# Patient Record
Sex: Male | Born: 1935 | Race: White | Hispanic: No | Smoking: Former smoker
Health system: Southern US, Community
[De-identification: ages and names within clinical notes are randomized; demographics above are authoritative.]

## PROBLEM LIST (undated history)

## (undated) DIAGNOSIS — I639 Cerebral infarction, unspecified: Secondary | ICD-10-CM

## (undated) DIAGNOSIS — G309 Alzheimer's disease, unspecified: Secondary | ICD-10-CM

## (undated) DIAGNOSIS — Z860101 Personal history of adenomatous and serrated colon polyps: Secondary | ICD-10-CM

## (undated) DIAGNOSIS — N200 Calculus of kidney: Secondary | ICD-10-CM

## (undated) DIAGNOSIS — E785 Hyperlipidemia, unspecified: Secondary | ICD-10-CM

## (undated) DIAGNOSIS — I4891 Unspecified atrial fibrillation: Secondary | ICD-10-CM

## (undated) DIAGNOSIS — I2510 Atherosclerotic heart disease of native coronary artery without angina pectoris: Secondary | ICD-10-CM

## (undated) DIAGNOSIS — Z86010 Personal history of colonic polyps: Secondary | ICD-10-CM

## (undated) DIAGNOSIS — J449 Chronic obstructive pulmonary disease, unspecified: Secondary | ICD-10-CM

## (undated) DIAGNOSIS — E669 Obesity, unspecified: Secondary | ICD-10-CM

## (undated) DIAGNOSIS — F0280 Dementia in other diseases classified elsewhere without behavioral disturbance: Secondary | ICD-10-CM

## (undated) DIAGNOSIS — I214 Non-ST elevation (NSTEMI) myocardial infarction: Principal | ICD-10-CM

## (undated) DIAGNOSIS — R1310 Dysphagia, unspecified: Secondary | ICD-10-CM

## (undated) DIAGNOSIS — K589 Irritable bowel syndrome without diarrhea: Secondary | ICD-10-CM

## (undated) DIAGNOSIS — I10 Essential (primary) hypertension: Secondary | ICD-10-CM

## (undated) DIAGNOSIS — K219 Gastro-esophageal reflux disease without esophagitis: Secondary | ICD-10-CM

## (undated) DIAGNOSIS — Z8673 Personal history of transient ischemic attack (TIA), and cerebral infarction without residual deficits: Secondary | ICD-10-CM

## (undated) DIAGNOSIS — M359 Systemic involvement of connective tissue, unspecified: Secondary | ICD-10-CM

## (undated) HISTORY — DX: Irritable bowel syndrome, unspecified: K58.9

## (undated) HISTORY — DX: Personal history of adenomatous and serrated colon polyps: Z86.0101

## (undated) HISTORY — DX: Atherosclerotic heart disease of native coronary artery without angina pectoris: I25.10

## (undated) HISTORY — DX: Chronic obstructive pulmonary disease, unspecified: J44.9

## (undated) HISTORY — DX: Personal history of colonic polyps: Z86.010

## (undated) HISTORY — PX: BACK SURGERY: SHX140

## (undated) HISTORY — DX: Calculus of kidney: N20.0

## (undated) HISTORY — DX: Unspecified atrial fibrillation: I48.91

## (undated) HISTORY — DX: Gastro-esophageal reflux disease without esophagitis: K21.9

## (undated) HISTORY — DX: Hyperlipidemia, unspecified: E78.5

## (undated) HISTORY — DX: Non-ST elevation (NSTEMI) myocardial infarction: I21.4

## (undated) HISTORY — DX: Obesity, unspecified: E66.9

## (undated) HISTORY — DX: Essential (primary) hypertension: I10

## (undated) HISTORY — DX: Cerebral infarction, unspecified: I63.9

## (undated) HISTORY — DX: Personal history of transient ischemic attack (TIA), and cerebral infarction without residual deficits: Z86.73

## (undated) HISTORY — PX: APPENDECTOMY: SHX54

---

## 1998-05-01 ENCOUNTER — Inpatient Hospital Stay (HOSPITAL_COMMUNITY): Admission: EM | Admit: 1998-05-01 | Discharge: 1998-05-02 | Payer: Self-pay | Admitting: *Deleted

## 2001-03-30 ENCOUNTER — Other Ambulatory Visit: Admission: RE | Admit: 2001-03-30 | Discharge: 2001-03-30 | Payer: Self-pay | Admitting: Dermatology

## 2001-06-22 ENCOUNTER — Ambulatory Visit (HOSPITAL_COMMUNITY): Admission: RE | Admit: 2001-06-22 | Discharge: 2001-06-22 | Payer: Self-pay | Admitting: Internal Medicine

## 2001-12-09 HISTORY — PX: ESOPHAGOGASTRODUODENOSCOPY: SHX1529

## 2002-05-03 ENCOUNTER — Ambulatory Visit (HOSPITAL_COMMUNITY): Admission: RE | Admit: 2002-05-03 | Discharge: 2002-05-03 | Payer: Self-pay | Admitting: Internal Medicine

## 2002-05-03 ENCOUNTER — Encounter: Payer: Self-pay | Admitting: Internal Medicine

## 2002-05-17 ENCOUNTER — Ambulatory Visit (HOSPITAL_COMMUNITY): Admission: RE | Admit: 2002-05-17 | Discharge: 2002-05-17 | Payer: Self-pay | Admitting: Internal Medicine

## 2002-05-17 ENCOUNTER — Encounter: Payer: Self-pay | Admitting: Internal Medicine

## 2002-06-03 ENCOUNTER — Ambulatory Visit (HOSPITAL_COMMUNITY): Admission: RE | Admit: 2002-06-03 | Discharge: 2002-06-03 | Payer: Self-pay | Admitting: Internal Medicine

## 2003-05-30 ENCOUNTER — Encounter: Payer: Self-pay | Admitting: Internal Medicine

## 2003-05-30 ENCOUNTER — Ambulatory Visit (HOSPITAL_COMMUNITY): Admission: RE | Admit: 2003-05-30 | Discharge: 2003-05-30 | Payer: Self-pay | Admitting: Internal Medicine

## 2003-07-18 ENCOUNTER — Encounter: Payer: Self-pay | Admitting: Internal Medicine

## 2003-07-18 ENCOUNTER — Encounter (HOSPITAL_COMMUNITY): Admission: RE | Admit: 2003-07-18 | Discharge: 2003-08-17 | Payer: Self-pay | Admitting: Internal Medicine

## 2004-08-21 ENCOUNTER — Ambulatory Visit (HOSPITAL_COMMUNITY): Admission: RE | Admit: 2004-08-21 | Discharge: 2004-08-21 | Payer: Self-pay | Admitting: Internal Medicine

## 2004-08-21 HISTORY — PX: COLONOSCOPY: SHX174

## 2006-08-05 ENCOUNTER — Other Ambulatory Visit: Admission: RE | Admit: 2006-08-05 | Discharge: 2006-08-05 | Payer: Self-pay | Admitting: Ophthalmology

## 2006-08-05 ENCOUNTER — Encounter (INDEPENDENT_AMBULATORY_CARE_PROVIDER_SITE_OTHER): Payer: Self-pay | Admitting: *Deleted

## 2009-07-21 ENCOUNTER — Encounter (INDEPENDENT_AMBULATORY_CARE_PROVIDER_SITE_OTHER): Payer: Self-pay | Admitting: *Deleted

## 2009-09-06 ENCOUNTER — Ambulatory Visit: Payer: Self-pay | Admitting: Internal Medicine

## 2009-09-07 ENCOUNTER — Encounter: Payer: Self-pay | Admitting: Internal Medicine

## 2009-09-08 DIAGNOSIS — Z860100 Personal history of colon polyps, unspecified: Secondary | ICD-10-CM | POA: Insufficient documentation

## 2009-09-08 DIAGNOSIS — R1310 Dysphagia, unspecified: Secondary | ICD-10-CM | POA: Insufficient documentation

## 2009-09-08 HISTORY — PX: COLONOSCOPY: SHX174

## 2009-09-08 HISTORY — PX: ESOPHAGOGASTRODUODENOSCOPY: SHX1529

## 2009-09-13 ENCOUNTER — Ambulatory Visit: Payer: Self-pay | Admitting: Internal Medicine

## 2009-09-13 ENCOUNTER — Encounter: Payer: Self-pay | Admitting: Internal Medicine

## 2009-09-13 ENCOUNTER — Ambulatory Visit (HOSPITAL_COMMUNITY): Admission: RE | Admit: 2009-09-13 | Discharge: 2009-09-13 | Payer: Self-pay | Admitting: Internal Medicine

## 2009-09-17 ENCOUNTER — Encounter: Payer: Self-pay | Admitting: Internal Medicine

## 2011-04-26 NOTE — Op Note (Signed)
NAME:  Tyler Mejia, Tyler Mejia                           ACCOUNT NO.:  [id number]   MEDICAL RECORD NO.:  [id number]                   PATIENT TYPE:  AMB   LOCATION:  DAY                                  FACILITY:  [facility]   PHYSICIAN:  R. Roetta Sessions, M.D.              DATE OF BIRTH:  09-14-1936   DATE OF PROCEDURE:  08/21/2004  DATE OF DISCHARGE:                                 OPERATIVE REPORT   PROCEDURE:  Colonoscopy and snare polypectomy.   INDICATIONS FOR PROCEDURE:  The patient is a 75 year old gentleman with a  history of colonic adenomatous polyps (last polypectomy 2002) and done well  from a GI standpoint. He is here for surveillance. This approach has been  discussed with the patient at length. Potential risks, benefits, and  alternatives have been reviewed and questions answered. He is agreeable.  Please see my handwritten H&P for more information.   PROCEDURE NOTE:  O2 saturation, blood pressure, pulse, and respirations  monitored throughout the entirety of the procedure. Conscious sedation with  Versed 5 mg IV and Demerol 75 mg IV in divided doses.   INSTRUMENT:  Olympus video chip system.   FINDINGS:  Digital rectal examination revealed no abnormalities.   ENDOSCOPIC FINDINGS:  Prep was good.   Rectum:  Examination of the rectal mucosa including retroflexed view of anal  verge revealed no abnormalities.   Colon:  Colonic mucosa was surveyed from the rectosigmoid junction through  the left, transverse, and right colon to area of the appendiceal orifice,  ileocecal valve, and cecum. These structures were well seen and photographed  for the record. Olympus video scope was slowly withdrawn, and all previously  mentioned mucosal surfaces were again seen. There was a 5-mm pedunculated  polyp at the splenic flexure which was cold snared and recovered through the  scope. Remainder of colonic mucosa appeared normal. The patient tolerated  the procedure well and was reactive to  endoscopy.   IMPRESSION:  Pedunculated polyp at the splenic flexure, removed with snare.  Remainder of colonic mucosa and rectum appeared normal.   RECOMMENDATIONS:  1.  Follow up on pathology.  2.  Further recommendations to follow.      ___________________________________________                                            Jonathon Bellows, M.D.   RMR/MEDQ  D:  08/21/2004  T:  08/21/2004  Job:  [id number]   cc:   Kingsley Callander. Ouida Sills, M.D.  [address]  [place]  [place] [address]  Fax: [phone]

## 2011-04-26 NOTE — Op Note (Signed)
[facility]  Patient:    Tyler Mejia, Tyler Mejia Visit Number: [id number] MRN: [id number]          Service Type: END Location: DAY Attending Physician:  Jonathon Bellows Dictated by:   Roetta Sessions, M.D. Proc. Date: 06/03/02 Admit Date:  06/03/2002 Discharge Date: 06/03/2002   CC:         Carylon Perches, M.D.   Operative Report  PROCEDURE:  Diagnostic esophagogastroduodenoscopy.  ENDOSCOPIST:  Roetta Sessions, M.D.  INDICATIONS:  The patient is a pleasant 75 year old gentleman with longstanding reflux symptoms that have worsened recently.  No odynophagia or dysphagia, early satiety, nausea or vomiting, no melena.  Brother succumbed to esophageal cancer.  He has never had his upper GI tract imaged.  An EGD is now being done to further evaluate his symptoms.  Approach has been discussed with with patient at length.  Potential risks, benefits and alternatives have been reviewed and questions answered.  He is agreeable.  Please see my dictated H&P from May 31, 2002, for more information.  DESCRIPTION OF PROCEDURE:  O2 saturation, blood pressure, pulse and respirations were monitored throughout the entire procedure with conscious sedation.  Versed 3 mg IV and Demerol 75 mg IV in divided doses.  Instrument was Olympus video chip gastroscope.  FINDINGS:  Examination of the tubular esophagus revealed distal esophageal erosions of the circumferential distribution within 1 cm of the EG junction. There was no Barretts esophagus.  No ring or stricture or neoplasm.  The EG junction was easily traversed.  Stomach:  The gastric cavity was empty and insufflated well with air.  Further examination of the gastric mucosa including a retroflexed view of the proximal stomach, esophagogastric junction demonstrated no abnormalities.  Pylorus was patent and easily traversed.  The duodenum, bulb and second portion appeared normal.  THERAPEUTIC/DIAGNOSTIC MANEUVERS PERFORMED:   None.  No photographs were taken because of equipment failure.  The patient tolerated the procedure well and was reactive to endoscopy.  IMPRESSION: 1. Distal esophageal erosions consistent with mild erosive reflux    esophagitis. 2. The remainder of the esophagus, stomach and duodenum to the    second portion appeared normal.  RECOMMENDATIONS: 1. Begin Nexium 40 mg orally daily 30 minutes before breakfast.    He is to go by my office to get one months worth of free    samples. 2. Followup appointment with [place] to assess his progress in one    month. Dictated by:   Roetta Sessions, M.D. Attending Physician:  Jonathon Bellows DD:  06/03/02 TD:  06/05/02 Job: [id number]

## 2011-07-19 ENCOUNTER — Ambulatory Visit (INDEPENDENT_AMBULATORY_CARE_PROVIDER_SITE_OTHER): Payer: Medicare Other | Admitting: Urology

## 2011-07-19 DIAGNOSIS — F524 Premature ejaculation: Secondary | ICD-10-CM

## 2011-07-19 DIAGNOSIS — N529 Male erectile dysfunction, unspecified: Secondary | ICD-10-CM

## 2011-10-18 ENCOUNTER — Ambulatory Visit: Payer: Medicare Other | Admitting: Urology

## 2012-02-07 DIAGNOSIS — I214 Non-ST elevation (NSTEMI) myocardial infarction: Secondary | ICD-10-CM

## 2012-02-07 HISTORY — DX: Non-ST elevation (NSTEMI) myocardial infarction: I21.4

## 2012-02-27 ENCOUNTER — Encounter: Payer: Self-pay | Admitting: Internal Medicine

## 2012-02-27 ENCOUNTER — Telehealth: Payer: Self-pay

## 2012-02-27 NOTE — Telephone Encounter (Signed)
Pt called- he has been having a lot of heartburn and throat pain. He wonders if there is something wrong with his gallbladder. He has recently been on abx from Dr. Ouida Sills for chest cold and congestion. Pt said there was nothing wrong with his heart. He is taking prilosec and maalox and it is not helping. He called Dr. Ouida Sills and he cant see him until next week. We had one appt left for tomorrow with [facility]SL. Gave pt appt but advised him that if his condition worsens he needed to go to the ED.  Pt verbalized understanding.

## 2012-02-27 NOTE — Telephone Encounter (Signed)
Noted  

## 2012-02-28 ENCOUNTER — Encounter: Payer: Self-pay | Admitting: Gastroenterology

## 2012-02-28 ENCOUNTER — Emergency Department (HOSPITAL_COMMUNITY): Payer: Medicare Other

## 2012-02-28 ENCOUNTER — Inpatient Hospital Stay (HOSPITAL_COMMUNITY)
Admission: EM | Admit: 2012-02-28 | Discharge: 2012-03-03 | DRG: 247 | Disposition: A | Discharge status: Home / Self Care | Payer: Medicare Other | Attending: Internal Medicine | Admitting: Internal Medicine

## 2012-02-28 ENCOUNTER — Ambulatory Visit (INDEPENDENT_AMBULATORY_CARE_PROVIDER_SITE_OTHER): Payer: Medicare Other | Admitting: Gastroenterology

## 2012-02-28 ENCOUNTER — Other Ambulatory Visit: Payer: Self-pay

## 2012-02-28 ENCOUNTER — Encounter (HOSPITAL_COMMUNITY): Payer: Self-pay | Admitting: *Deleted

## 2012-02-28 DIAGNOSIS — I10 Essential (primary) hypertension: Secondary | ICD-10-CM | POA: Diagnosis present

## 2012-02-28 DIAGNOSIS — R0602 Shortness of breath: Secondary | ICD-10-CM | POA: Insufficient documentation

## 2012-02-28 DIAGNOSIS — R5383 Other fatigue: Secondary | ICD-10-CM | POA: Insufficient documentation

## 2012-02-28 DIAGNOSIS — Z7982 Long term (current) use of aspirin: Secondary | ICD-10-CM

## 2012-02-28 DIAGNOSIS — R630 Anorexia: Secondary | ICD-10-CM | POA: Insufficient documentation

## 2012-02-28 DIAGNOSIS — R5381 Other malaise: Secondary | ICD-10-CM

## 2012-02-28 DIAGNOSIS — Z86010 Personal history of colonic polyps: Secondary | ICD-10-CM

## 2012-02-28 DIAGNOSIS — I214 Non-ST elevation (NSTEMI) myocardial infarction: Principal | ICD-10-CM | POA: Diagnosis present

## 2012-02-28 DIAGNOSIS — R079 Chest pain, unspecified: Secondary | ICD-10-CM

## 2012-02-28 DIAGNOSIS — K219 Gastro-esophageal reflux disease without esophagitis: Secondary | ICD-10-CM | POA: Diagnosis present

## 2012-02-28 DIAGNOSIS — Z87891 Personal history of nicotine dependence: Secondary | ICD-10-CM

## 2012-02-28 DIAGNOSIS — Z8249 Family history of ischemic heart disease and other diseases of the circulatory system: Secondary | ICD-10-CM

## 2012-02-28 LAB — CARDIAC PANEL(CRET KIN+CKTOT+MB+TROPI)
CK, MB: 9.6 ng/mL (ref 0.3–4.0)
Total CK: 223 U/L (ref 7–232)
Troponin I: 3.49 ng/mL (ref <0.30)

## 2012-02-28 LAB — CK TOTAL AND CKMB (NOT AT ARMC)
CK, MB: 13.4 ng/mL (ref 0.3–4.0)
Relative Index: 5.2 — ABNORMAL HIGH (ref 0.0–2.5)

## 2012-02-28 LAB — CBC
Platelets: 247 10*3/uL (ref 150–400)
RDW: 12.9 % (ref 11.5–15.5)
WBC: 14.8 10*3/uL — ABNORMAL HIGH (ref 4.0–10.5)

## 2012-02-28 LAB — BASIC METABOLIC PANEL
Chloride: 97 mEq/L (ref 96–112)
GFR calc Af Amer: >90 mL/min (ref >90)
Potassium: 4.7 mEq/L (ref 3.5–5.1)

## 2012-02-28 LAB — PROTIME-INR
INR: 1.1 (ref 0.00–1.49)
Prothrombin Time: 14.4 seconds (ref 11.6–15.2)

## 2012-02-28 LAB — TROPONIN I: Troponin I: 2.47 ng/mL (ref <0.30)

## 2012-02-28 MED ORDER — EPTIFIBATIDE BOLUS VIA INFUSION
180.0000 ug/kg | Freq: Once | INTRAVENOUS | Status: AC
  Administered 2012-02-28: 19800 ug via INTRAVENOUS
  Filled 2012-02-28: qty 27

## 2012-02-28 MED ORDER — ASPIRIN EC 81 MG PO TBEC
81.0000 mg | DELAYED_RELEASE_TABLET | Freq: Every day | ORAL | Status: DC
  Administered 2012-02-29 – 2012-03-03 (×3): 81 mg via ORAL
  Filled 2012-02-28 (×4): qty 1

## 2012-02-28 MED ORDER — ASPIRIN 300 MG RE SUPP
300.0000 mg | RECTAL | Status: AC
  Filled 2012-02-28: qty 1

## 2012-02-28 MED ORDER — METOPROLOL TARTRATE 12.5 MG HALF TABLET
12.5000 mg | ORAL_TABLET | Freq: Two times a day (BID) | ORAL | Status: DC
  Administered 2012-02-28 – 2012-03-03 (×7): 12.5 mg via ORAL
  Filled 2012-02-28 (×9): qty 1

## 2012-02-28 MED ORDER — ASPIRIN 81 MG PO CHEW
324.0000 mg | CHEWABLE_TABLET | ORAL | Status: AC
  Administered 2012-02-28: 324 mg via ORAL
  Filled 2012-02-28: qty 4

## 2012-02-28 MED ORDER — HEPARIN (PORCINE) IN NACL 100-0.45 UNIT/ML-% IJ SOLN
1250.0000 [IU]/h | INTRAMUSCULAR | Status: DC
  Administered 2012-02-28: 1000 [IU]/h via INTRAVENOUS
  Administered 2012-02-29: 1150 [IU]/h via INTRAVENOUS
  Administered 2012-03-01: 1250 [IU]/h via INTRAVENOUS
  Filled 2012-02-28 (×6): qty 250

## 2012-02-28 MED ORDER — HEPARIN BOLUS VIA INFUSION
4000.0000 [IU] | Freq: Once | INTRAVENOUS | Status: AC
  Administered 2012-02-28: 4000 [IU] via INTRAVENOUS

## 2012-02-28 MED ORDER — NITROGLYCERIN 0.4 MG SL SUBL: 0.4000 mg | SUBLINGUAL_TABLET | SUBLINGUAL | Status: DC | PRN

## 2012-02-28 MED ORDER — AMLODIPINE BESYLATE 5 MG PO TABS
5.0000 mg | ORAL_TABLET | Freq: Every day | ORAL | Status: DC
  Administered 2012-02-29 – 2012-03-03 (×4): 5 mg via ORAL
  Filled 2012-02-28 (×4): qty 1

## 2012-02-28 MED ORDER — ASPIRIN 81 MG PO CHEW
324.0000 mg | CHEWABLE_TABLET | Freq: Once | ORAL | Status: AC
  Administered 2012-02-28: 324 mg via ORAL
  Filled 2012-02-28: qty 4

## 2012-02-28 MED ORDER — ACETAMINOPHEN 325 MG PO TABS: 650.0000 mg | ORAL_TABLET | ORAL | Status: DC | PRN

## 2012-02-28 MED ORDER — METOPROLOL TARTRATE 25 MG PO TABS: 25.0000 mg | ORAL_TABLET | Freq: Two times a day (BID) | ORAL | Status: DC

## 2012-02-28 MED ORDER — EPTIFIBATIDE 75 MG/100ML IV SOLN
2.0000 ug/kg/min | INTRAVENOUS | Status: DC
  Administered 2012-02-28 – 2012-03-02 (×9): 2 ug/kg/min via INTRAVENOUS
  Filled 2012-02-28 (×23): qty 100

## 2012-02-28 MED ORDER — ATORVASTATIN CALCIUM 80 MG PO TABS
80.0000 mg | ORAL_TABLET | Freq: Every day | ORAL | Status: DC
  Administered 2012-02-29 – 2012-03-02 (×3): 80 mg via ORAL
  Filled 2012-02-28 (×4): qty 1

## 2012-02-28 MED ORDER — ONDANSETRON HCL 4 MG/2ML IJ SOLN: 4.0000 mg | Freq: Four times a day (QID) | INTRAMUSCULAR | Status: DC | PRN

## 2012-02-28 NOTE — Patient Instructions (Signed)
I have discussed your case with Dr. Jena Gauss. We recommend you go to emergency room You need to be evaluate for dehydration. Your chest pain needs to be worked up.  The ER doctor will rule out heart issues. You may need CXR and or CT abd for further evaluation of anorexia, n/v, ?splenomegaly, to look at gallbladder.  I will call and let them know you are coming.

## 2012-02-28 NOTE — Progress Notes (Signed)
ANTICOAGULATION CONSULT NOTE - Initial Consult  Pharmacy Consult for Integrillin + Heparin Indication: chest pain/ACS  Allergies  Allergen Reactions  . Codeine     Patient Measurements: Height: 5\' 9"  (175.3 cm) Weight: 242 lb (109.77 kg) IBW/kg (Calculated) : 70.7  Heparin Dosing Weight: 94.8 kg  Vital Signs: Temp: 97.7 F (36.5 C) (03/22 1144) Temp src: Temporal (03/22 1029) BP: 132/81 mmHg (03/22 1900) Pulse Rate: 59  (03/22 1900)  Labs:  Basename 02/28/12 1419 02/28/12 1133  HGB -- 15.3  HCT -- 45.5  PLT -- 247  APTT -- --  LABPROT -- --  INR -- --  HEPARINUNFRC -- --  CREATININE -- 0.98  CKTOTAL 258* --  CKMB 13.4* --  TROPONINI -- 2.47*   Estimated Creatinine Clearance: 81.9 ml/min (by C-G formula based on Cr of 0.98).  Medical History: Past Medical History  Diagnosis Date  . GERD (gastroesophageal reflux disease)   . HTN (hypertension)   . Hx of adenomatous colonic polyps   . Kidney stones     Medications:  Prescriptions prior to admission  Medication Sig Dispense Refill  . amLODipine (NORVASC) 5 MG tablet Take 5 mg by mouth daily.       Marland Kitchen aspirin 81 MG tablet Take 81 mg by mouth daily.      . diphenoxylate-atropine (LOMOTIL) 2.5-0.025 MG per tablet Take 1 tablet by mouth 4 (four) times daily as needed.      . lansoprazole (PREVACID) 30 MG capsule Take 30 mg by mouth daily.      . metoprolol tartrate (LOPRESSOR) 25 MG tablet Take 12.5 mg by mouth 2 (two) times daily.       . traMADol (ULTRAM) 50 MG tablet Take 50 mg by mouth every 6 (six) hours as needed.      . cefUROXime (CEFTIN) 250 MG tablet Take 250 mg by mouth 2 (two) times daily.        Scheduled:    . amLODipine  5 mg Oral Daily  . aspirin  324 mg Oral Once  . aspirin  324 mg Oral NOW   Or  . aspirin  300 mg Rectal NOW  . aspirin EC  81 mg Oral Daily  . atorvastatin  80 mg Oral q1800  . heparin  4,000 Units Intravenous Once  . metoprolol tartrate  12.5 mg Oral BID  . DISCONTD:  metoprolol tartrate  25 mg Oral BID    Assessment: 76 y.o. M to start Heparin + Integrilin for ACS sx in the setting of positive cardiac enzymes. The patient was already initiated on heparin with a bolus of 4000 units and started at a drip rate of 1000 units/hr (10 ml/hr) around 1800 today. Hgb/Hct/Plt ok. No active s/sx of bleeding noted. SCr 0.98, CrCl~70-80 ml/min.  Goal of Therapy:  Heparin level of 0.3-0.5 units/ml (while concurrently on Integrilin)  Plan:  1. Continue heparin at current rate of 1000 units/hr 2. Integrilin bolus of 180 mcg/kg (~19800 mg) x 1 now 3. Initiate integrilin drip at rate of 2 mcg/kg/min (~219.6 mcg/min or 17.6 mL/hr) 4. Will check heparin level 8 hours after drip initiated (~0200 on 3/23) 5. Will check CBC ~8-12 hours after integrilin initiated (with a.m labs on 3/23) 6. Daily heparin levels starting on 3/24 a.m. 7. Will continue to monitor for any signs/symptoms of bleeding   Georgina Pillion, PharmD, BCPS Clinical Pharmacist Pager: [phone] 02/28/2012 8:38 PM

## 2012-02-28 NOTE — Assessment & Plan Note (Addendum)
Recent upper respiratory infection, given Ceftin one week ago. After 3 days of antibiotics, patient complains of central chest pain which radiates out to both shoulders. Associated with overwhelming fatigue, dyspnea on exertion, diaphoresis and presyncopal episode. No previous cardiac history. Patient's really without any significant abdominal pain. He does complain of her anorexia, nausea with meals. He tells me he has not had anything significant to eat and drink in several days and has had decreased urinary output. Some of his systemic symptoms could be related to dehydration however I feel that he needs to have cardiac etiology ruled out.  He also had increased fullness in the left upper quadrant, possible splenomegaly. He will need to have CT imaging of his abdomen at some point, possibly today in the emergent department if his cardiac workup is negative. I don't suspect that all the symptoms can be explained by GERD. We cannot rule out underlying gallbladder disorder at this time.  Would recommend the following labs during ED evaluation, CBC, CMET, lipase, cardiac enzymes. We will follow his workup and further recommendations to follow.  Above findings and recommendations discussed with Dr. Jena Gauss.

## 2012-02-28 NOTE — ED Notes (Signed)
Pt states he went to the Dr Eddie North and received an antibiotic Rx and the pain began 3 days after starting the antibiotic.

## 2012-02-28 NOTE — Progress Notes (Signed)
Received Critical lab values  For pt. Notified Dr Yetta Barre pt had CKMB 9.6 and Troponin of 3.49. No further action needed at this time.

## 2012-02-28 NOTE — ED Notes (Signed)
CRITICAL VALUE ALERT  Critical value received:  Troponin 2.5  Date of notification:  02/28/2012  Time of notification:  1355  Critical value read back: yes  Nurse who received alert:  Arva Chafe RN  MD notified (1st page):  [email or web address]  Time of first page:  1356  MD notified (2nd page):  Time of second page:  Responding MD:  Effie Shy  Time MD responded:  1356

## 2012-02-28 NOTE — ED Provider Notes (Cosign Needed)
History     CSN: [id number]  Arrival date & time 02/28/12  1133   First MD Initiated Contact with Patient 02/28/12 1403      Chief Complaint  Patient presents with  . Chest Pain    (Consider location/radiation/quality/duration/timing/severity/associated sxs/prior treatment) Patient is a 76 y.o. male presenting with chest pain. The history is provided by the patient and a relative.  Chest Pain The chest pain began 1 - 2 weeks ago. Chest pain occurs constantly. The chest pain is improving. At its most intense, the pain is at 10/10. The pain is currently at 1/10. The severity of the pain is moderate. The quality of the pain is described as burning. The pain does not radiate. Primary symptoms include fatigue. Pertinent negatives for primary symptoms include no fever and no cough.  Associated symptoms include weakness. He tried nothing for the symptoms. Risk factors include male gender and obesity.  His past medical history is significant for hypertension.    Tyler Mejia is a 76 y.o. male who presents to the Emergency Department complaining of an acute onset moderate to severe constant burning chest pain that was rated a 10/10 for 2 weeks. Pain in chest wall was a 10 this morning. Pt was sent from Dr. Jena Gauss. Pain is currently a 1/10. Pt took a pain pill and an antacid to treat pain this morning. Decreased appetite since onset, feels weak, pt has been taking aspirin daily but has not taken since the onset of chest pain. Past Medical History  Diagnosis Date  . GERD (gastroesophageal reflux disease)   . HTN (hypertension)   . Hx of adenomatous colonic polyps   . Kidney stones     Past Surgical History  Procedure Date  . Appendectomy   . Ruptured disc   . Esophagogastroduodenoscopy 2003    distal erosion/small hiatal hernia  . Colonoscopy 08/21/2004    Pedunculated polyp at the splenic flexure  . Colonoscopy 09/2009    Dr. Rourk--> Cecal and hepatic flexure polyps, tubular adenomas.  Next colonoscopy October 2013  . Esophagogastroduodenoscopy 09/2009    Dr. Rinaldo Ratel ring, distal septal regions, status post [facility] dilation, small hiatal hernia.  . Back surgery     Family History  Problem Relation Age of Onset  . Pneumonia Father     deceased age 93  . Heart disease Mother     deceased age 23  . Breast cancer Sister     2 sisters  . Esophageal cancer Brother     History  Substance Use Topics  . Smoking status: Former Games developer  . Smokeless tobacco: Not on file   Comment: quit about 9 years ago  . Alcohol Use: No      Review of Systems  Constitutional: Positive for fatigue. Negative for fever.  Respiratory: Negative for cough.   Cardiovascular: Positive for chest pain.  Neurological: Positive for weakness.  10 Systems reviewed and are negative for acute change except as noted in the HPI.   Allergies  Codeine  Home Medications   No current outpatient prescriptions on file.  BP 132/74  Pulse 67  Temp(Src) 99.2 F (37.3 C) (Oral)  Resp 18  Ht 5\' 9"  (1.753 m)  Wt 242 lb (109.77 kg)  BMI 35.74 kg/m2  SpO2 94%  Physical Exam  Nursing note and vitals reviewed. Constitutional: He is oriented to person, place, and time. He appears well-developed and well-nourished.  HENT:  Head: Normocephalic and atraumatic.  Neck: Normal range of motion. Neck supple.  No JVD present.  Cardiovascular: Normal rate, regular rhythm and normal heart sounds.   Pulmonary/Chest: Effort normal. He has wheezes (end expiratory wheeezing bilaterally). He has rales.  Musculoskeletal: Normal range of motion. He exhibits no edema.  Neurological: He is alert and oriented to person, place, and time. No cranial nerve deficit. He exhibits normal muscle tone.  Skin: Skin is warm and dry.  Psychiatric: He has a normal mood and affect. His behavior is normal. Thought content normal.    ED Course  Procedures (including critical care time) DIAGNOSTIC STUDIES: Oxygen  Saturation is 92% on room air, adequate by my interpretation.    Date: 02/28/2012-11:52  Rate: 61  Rhythm: normal sinus rhythm  QRS Axis: normal  Intervals: normal  ST/T Wave abnormalities: nonspecific ST changes  Conduction Disutrbances:none  Narrative Interpretation:   Old EKG Reviewed: none available   Date: 02/28/2012-15:57  Rate: 57  Rhythm: normal sinus rhythm  QRS Axis: normal  Intervals: normal  ST/T Wave abnormalities: nonspecific ST changes  Conduction Disutrbances:none  Narrative Interpretation:   Old EKG Reviewed: unchanged from earlier today  10:58 AM Reevaluation with update and discussion. After initial assessment and treatment, an updated evaluation reveals  no recurrence of chest pain. Call placed to his PCP, who prefers that the patient be transferred to [facility] for cardiology evaluation. Per the PCP, the patient was cathetered in 1999 with nonobstructive three-vessel coronary artery disease. [name]   IV Heparin Bolus and Drip ordered. Arranged transfer to [facility] in conjunction with Dr Clarise Cruz.  CRITICAL CARE Performed by: Flint Melter   Total critical care time: 35 min  Critical care time was exclusive of separately billable procedures and treating other patients.  Critical care was necessary to treat or prevent imminent or life-threatening deterioration.  Critical care was time spent personally by me on the following activities: development of treatment plan with patient and/or surrogate as well as nursing, discussions with consultants, evaluation of patient's response to treatment, examination of patient, obtaining history from patient or surrogate, ordering and performing treatments and interventions, ordering and review of laboratory studies, ordering and review of radiographic studies, pulse oximetry and re-evaluation of patient's condition.    COORDINATION OF CARE:  Medications  heparin ADULT infusion 100 units/mL (25000 units/250  mL) (1300 Units/hr Intravenous Rate/Dose Change 03/01/12 0025)  aspirin EC tablet 81 mg (81 mg Oral Given 03/01/12 1008)  nitroGLYCERIN (NITROSTAT) SL tablet 0.4 mg (not administered)  acetaminophen (TYLENOL) tablet 650 mg (not administered)  ondansetron (ZOFRAN) injection 4 mg (not administered)  atorvastatin (LIPITOR) tablet 80 mg (80 mg Oral Given 02/29/12 1707)  amLODipine (NORVASC) tablet 5 mg (5 mg Oral Given 03/01/12 1008)  metoprolol tartrate (LOPRESSOR) tablet 12.5 mg (12.5 mg Oral Given 03/01/12 1008)  eptifibatide (INTEGRILIN) 75 mg / 100 mL infusion (2 mcg/kg/min Intravenous New Bag/Given 03/01/12 0928)  sodium chloride 0.9 % injection 3 mL (3 mL Intravenous Given 03/01/12 1008)  sodium chloride 0.9 % injection 3 mL (not administered)  0.9 %  sodium chloride infusion (not administered)  0.9 %  sodium chloride infusion (not administered)  aspirin chewable tablet 324 mg (324 mg Oral Given 02/28/12 1424)  heparin bolus via infusion 4,000 Units (4000 Units Intravenous Given 02/28/12 1755)  aspirin chewable tablet 324 mg (324 mg Oral Given 02/28/12 2210)    Or  aspirin suppository 300 mg (  Rectal See Alternative 02/28/12 2210)  eptifibatide (INTEGRILIN) 0.75 mg/mL bolus via infusion 19,800 mcg (19800 mcg Intravenous Given 02/28/12 2100)  Labs Reviewed  CBC - Abnormal; Notable for the following:    WBC 14.8 (*)    All other components within normal limits  BASIC METABOLIC PANEL - Abnormal; Notable for the following:    Sodium 134 (*)    Glucose, Bld 100 (*)    GFR calc non Af Amer 80 (*)    All other components within normal limits  TROPONIN I - Abnormal; Notable for the following:    Troponin I 2.47 (*)    All other components within normal limits  CK TOTAL AND CKMB - Abnormal; Notable for the following:    Total CK 258 (*)    CK, MB 13.4 (*)    Relative Index 5.2 (*)    All other components within normal limits  CARDIAC PANEL(CRET KIN+CKTOT+MB+TROPI) - Abnormal; Notable for  the following:    CK, MB 9.6 (*)    Troponin I 3.49 (*)    Relative Index 4.3 (*)    All other components within normal limits  CARDIAC PANEL(CRET KIN+CKTOT+MB+TROPI) - Abnormal; Notable for the following:    CK, MB 7.7 (*) CRITICAL VALUE NOTED.  VALUE IS CONSISTENT WITH PREVIOUSLY REPORTED AND CALLED VALUE.   Troponin I 3.97 (*)    Relative Index 3.6 (*)    All other components within normal limits  CARDIAC PANEL(CRET KIN+CKTOT+MB+TROPI) - Abnormal; Notable for the following:    CK, MB 6.3 (*) CRITICAL VALUE NOTED.  VALUE IS CONSISTENT WITH PREVIOUSLY REPORTED AND CALLED VALUE.   Troponin I 3.14 (*)    Relative Index 3.3 (*)    All other components within normal limits  LIPID PANEL - Abnormal; Notable for the following:    HDL 32 (*)    All other components within normal limits  CBC - Abnormal; Notable for the following:    WBC 12.8 (*)    All other components within normal limits  HEPARIN LEVEL (UNFRACTIONATED) - Abnormal; Notable for the following:    Heparin Unfractionated 0.25 (*)    All other components within normal limits  HEPARIN LEVEL (UNFRACTIONATED) - Abnormal; Notable for the following:    Heparin Unfractionated 0.28 (*)    All other components within normal limits  CBC - Abnormal; Notable for the following:    WBC 11.0 (*)    All other components within normal limits  PROTIME-INR  HEPARIN LEVEL (UNFRACTIONATED)  LAB REPORT - SCANNED  HEPARIN LEVEL (UNFRACTIONATED)   US Abdomen Complete  02/28/2012  *RADIOLOGY REPORT*  Clinical Data:  Pain, vomiting, enlarged spleen  COMPLETE ABDOMINAL ULTRASOUND  Comparison:  None.  Findings:  Gallbladder:  The gallbladder is visualized and no gallstones are noted.  There is no pain over the gallbladder with compression.  Common bile duct:  The common bile duct is normal measuring 2.7 mm in diameter.  Liver:  The liver has a normal echogenic pattern.  No ductal dilatation is seen.  IVC:  Appears normal.  Pancreas:  No focal  abnormality seen.  Spleen:  The spleen is within normal limits measuring 7.8 cm sagittally.  Right Kidney:  No hydronephrosis is seen.  The right kidney measures 12.7 cm sagittally.  Left Kidney:  No hydronephrosis is noted.  The left kidney measures 12.7 cm.  Abdominal aorta:  The abdominal aorta is normal in caliber.  IMPRESSION: Negative abdominal ultrasound.  No gallstones.  No ductal dilatation.  Original Report Authenticated By: Juline Patch, M.D.   Chest Portable 1 View  02/28/2012  *RADIOLOGY REPORT*  Clinical Data:  Chest pain  PORTABLE CHEST - 1 VIEW  Comparison: None.  Findings: Cardiomegaly.  Mild interstitial prominence. Mild vascular congestion without overt CHF.  No visible infiltrate.  No effusion or pneumothorax.  IMPRESSION: Cardiomegaly with mild interstitial prominence. No overt failure or infiltrates.  Original Report Authenticated By: Elsie Stain, M.D.     1. NSTEMI (non-ST elevated myocardial infarction)       MDM  Evaluation consistent with NSTEMI, apparently, now resolved with no chest pain during ED evaluation. No apparent congestive heart failure, cardiac arrhythmia, or other decompensation. Patient would be a risk for discharge needs to be admitted with cardiology evaluation. Transfer process Initiated.    I personally performed the services described in this documentation, which was scribed in my presence. The recorded information has been reviewed and considered.         Flint Melter, MD 03/01/12 1101

## 2012-02-28 NOTE — ED Notes (Signed)
Pain  Ant chest , throat, shoulders.  "heart burn".  Sent by Dr Jena Gauss

## 2012-02-28 NOTE — Progress Notes (Addendum)
Primary Care Physician:  Carylon Perches, MD, MD  Primary Gastroenterologist:  Roetta Sessions, MD   Chief Complaint  Patient presents with  . Heartburn    hurting in chest    HPI:  Tyler Mejia is a 76 y.o. male here for next day appointment. He called in yesterday concerned that he was having gallbladder issues and cannot be seen by his family doctor until next week.  States his symptoms began 3 days after starting Ceftin for the upper respiratory infection (started one week ago). States he started having pain which radiated from the center of his chest out to both shoulders. He complains of exertional shortness of breath but states he has had this chronically. He notes that he is unable to do anything because of overwhelming fatigue. 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 That was a few days ago. Since then he has been sitting around doing nothing. He cannot sleep because of the chest discomfort. Denies orthopnea, PND. He initially thought it was his heartburn. Historically his heartburn has been well controlled in the recent past. He takes Prevacid 30 mg daily. He is added multiple over-the-counter antacids which have not affected his chest pain. He denies odynophagia, dysphagia, suspected food or pill obstruction. Complains of anorexia. States he feels nauseated if he tries to eat. Denies any abdominal pain or back pain. No dysuria or hematuria. States his cough is somewhat better. Denies weight loss. Denies lower extremity edema. Notes that he is urinating less because he is unable to drink. Stop Ceftin three days ago.  Some night sweats.   BM ok, less b/c less appetite. No melena, brbpr.  No fever.   Last EKG six months ago. No heart w/u.      Current Outpatient Prescriptions  Medication Sig Dispense Refill  . amLODipine (NORVASC) 5 MG tablet Take 5 mg by mouth daily.       Marland Kitchen aspirin 81 MG tablet Take 81 mg by mouth daily.      .  diphenoxylate-atropine (LOMOTIL) 2.5-0.025 MG per tablet Take 1 tablet by mouth 4 (four) times daily as needed.      . lansoprazole (PREVACID) 30 MG capsule Take 30 mg by mouth daily.      . metoprolol tartrate (LOPRESSOR) 25 MG tablet Take 25 mg by mouth 2 (two) times daily. 1/2 pill a day       . traMADol (ULTRAM) 50 MG tablet Take 50 mg by mouth every 6 (six) hours as needed.      . cefUROXime (CEFTIN) 250 MG tablet Take 250 mg by mouth 2 (two) times daily.         Allergies as of 02/28/2012 - Review Complete 02/28/2012  Allergen Reaction Noted  . Codeine      Past Medical History  Diagnosis Date  . GERD (gastroesophageal reflux disease)   . HTN (hypertension)   . Hx of adenomatous colonic polyps   . Kidney stones     Past Surgical History  Procedure Date  . Appendectomy   . Ruptured disc   . Esophagogastroduodenoscopy 2003    distal erosion/small hiatal hernia  . Colonoscopy 08/21/2004    Pedunculated polyp at the splenic flexure  . Colonoscopy 09/2009    Dr. Rourk--> Cecal and hepatic flexure polyps, tubular adenomas. Next colonoscopy October 2013  . Esophagogastroduodenoscopy 09/2009    Dr. Rinaldo Ratel ring, distal septal regions, status post [facility] dilation, small hiatal hernia.    Family  History  Problem Relation Age of Onset  . Pneumonia Father     deceased age 47  . Heart disease Mother     deceased age 46  . Breast cancer Sister     2 sisters  . Esophageal cancer Brother     History   Social History  . Marital Status: Divorced    Spouse Name: N/A    Number of Children: N/A  . Years of Education: N/A   Occupational History  . retired Animator    Social History Main Topics  . Smoking status: Former Games developer  . Smokeless tobacco: Not on file   Comment: quit about 9 years ago  . Alcohol Use: No  . Drug Use: No  . Sexually Active: Not on file   Other Topics Concern  . Not on file   Social History Narrative  . No narrative on  file      ROS:  General: See HPI.  Eyes: Negative for vision changes.  ENT: Negative for hoarseness, difficulty swallowing , nasal congestion. CV: Negative for palpitations. See HPI.  Respiratory: Negative for dyspnea at rest. Positive for cough/sputum. See HPI.  GI: See history of present illness. GU:  Negative for dysuria, hematuria, urinary incontinence, urinary frequency, nocturnal urination.  MS: Negative for joint pain, low back pain.  Derm: Negative for rash or itching.  Neuro: Negative for weakness, abnormal sensation, seizure, frequent headaches, memory loss, confusion.  Psych: Negative for anxiety, depression, suicidal ideation, hallucinations.  Endo: Negative for unusual weight change.  Heme: Negative for bruising or bleeding. Allergy: Negative for rash or hives.    Physical Examination:  BP 110/70  Pulse 60  Temp(Src) 98.3 F (36.8 C) (Temporal)  Ht 5\' 9"  (1.753 m)  Wt 242 lb 12.8 oz (110.133 kg)  BMI 35.86 kg/m2   General: Well-nourished, well-developed in no acute distress. Accompanied by family. Head: Normocephalic, atraumatic.   Eyes: Conjunctiva pink, no icterus. Mouth: Oropharyngeal mucosa moist and pink , no lesions erythema or exudate. Neck: Supple without thyromegaly, masses, or lymphadenopathy.  Lungs: Clear to auscultation bilaterally.  Heart: Regular rate and rhythm, no murmurs rubs or gallops.  Abdomen: Bowel sounds are normal. Obese. Minimal tenderness epigastrium. Fullness in LUQ with tenderness, ?splenomegaly.  No abdominal bruits or hernia.   Rectal: Not performed. Extremities: No lower extremity edema. No clubbing or deformities.  Neuro: Alert and oriented x 4 , grossly normal neurologically.  Skin: Warm and dry, no rash or jaundice.   Psych: Alert and cooperative, normal mood and affect.  Labs: None  Imaging Studies: No results found.   Impression/Plan: Recent upper respiratory infection, given Ceftin one week ago. After 3 days of  antibiotics, patient complains of central chest pain which radiates out to both shoulders. Associated with overwhelming fatigue, dyspnea on exertion, diaphoresis and presyncopal episode. No previous cardiac history. Patient's really without any significant abdominal pain. He does complain of her anorexia, nausea with meals. He tells me he has not had anything significant to eat and drink in several days and has had decreased urinary output. Some of his systemic symptoms could be related to dehydration however I feel that he needs to have cardiac etiology ruled out.  He also had increased fullness in the left upper quadrant, possible splenomegaly. He will need to have CT imaging of his abdomen at some point, possibly today in the emergent department if his cardiac workup is negative. I don't suspect that all the symptoms can be explained by GERD.  We cannot rule out underlying gallbladder disorder at this time.  Would recommend the following labs during ED evaluation, CBC, CMET, lipase, cardiac enzymes. We will follow his workup and further recommendations to follow.  Above findings and recommendations discussed with Dr. Jena Gauss.

## 2012-02-28 NOTE — ED Notes (Addendum)
Lab called for Lawernce Ion RN - Critical CKMB 13.4. MD notified.

## 2012-02-28 NOTE — ED Notes (Signed)
MD at bedside. 

## 2012-02-28 NOTE — Progress Notes (Signed)
Date: 02/28/2012  1152  Rate: 61  Rhythm: normal sinus rhythm  QRS Axis: normal  Intervals: normal  ST/T Wave abnormalities: nonspecific ST changes  Conduction Disutrbances:none  Narrative Interpretation:   Old EKG Reviewed: none available

## 2012-02-29 DIAGNOSIS — I214 Non-ST elevation (NSTEMI) myocardial infarction: Principal | ICD-10-CM

## 2012-02-29 DIAGNOSIS — I219 Acute myocardial infarction, unspecified: Secondary | ICD-10-CM

## 2012-02-29 LAB — CBC
HCT: 42 % (ref 39.0–52.0)
Hemoglobin: 14.6 g/dL (ref 13.0–17.0)
MCV: 83.7 fL (ref 78.0–100.0)
RBC: 5.02 MIL/uL (ref 4.22–5.81)
WBC: 12.8 10*3/uL — ABNORMAL HIGH (ref 4.0–10.5)

## 2012-02-29 LAB — LIPID PANEL: LDL Cholesterol: 75 mg/dL (ref 0–99)

## 2012-02-29 LAB — CARDIAC PANEL(CRET KIN+CKTOT+MB+TROPI)
CK, MB: 7.7 ng/mL (ref 0.3–4.0)
CK, MB: 6.3 ng/mL (ref 0.3–4.0)
Relative Index: 3.3 — ABNORMAL HIGH (ref 0.0–2.5)
Total CK: 193 U/L (ref 7–232)
Troponin I: 3.14 ng/mL (ref <0.30)

## 2012-02-29 LAB — HEPARIN LEVEL (UNFRACTIONATED)
Heparin Unfractionated: 0.25 IU/mL — ABNORMAL LOW (ref 0.30–0.70)
Heparin Unfractionated: 0.28 IU/mL — ABNORMAL LOW (ref 0.30–0.70)

## 2012-02-29 MED ORDER — SODIUM CHLORIDE 0.9 % IV SOLN
1.0000 mL/kg/h | INTRAVENOUS | Status: DC
  Administered 2012-03-02: 1 mL/kg/h via INTRAVENOUS

## 2012-02-29 MED ORDER — SODIUM CHLORIDE 0.9 % IV SOLN: 250.0000 mL | INTRAVENOUS | Status: DC | PRN

## 2012-02-29 MED ORDER — SODIUM CHLORIDE 0.9 % IJ SOLN
3.0000 mL | Freq: Two times a day (BID) | INTRAMUSCULAR | Status: DC
  Administered 2012-02-29 – 2012-03-01 (×4): 3 mL via INTRAVENOUS

## 2012-02-29 MED ORDER — SODIUM CHLORIDE 0.9 % IJ SOLN: 3.0000 mL | INTRAMUSCULAR | Status: DC | PRN

## 2012-02-29 NOTE — H&P (Signed)
Tyler Mejia, Tyler Mejia NO.:  [id number]  MEDICAL RECORD NO.:  [id number]  LOCATION:  3729                         FACILITY:  [facility]  PHYSICIAN:  Natasha Bence, MD       DATE OF BIRTH:  27-Oct-1936  DATE OF ADMISSION:  02/28/2012 DATE OF DISCHARGE:                             HISTORY & PHYSICAL   CHIEF COMPLAINT:  Chest discomfort.  HISTORY OF PRESENT ILLNESS:  Tyler Mejia is a 76 year old obese white male who presented initially to his primary care physician's office with a chief complaint of an upper respiratory infection and diffuse chest discomfort.  He reports he has had chronic shortness of breath, but over the last several days, he has had increasing fatigue.  He reports this as a "chest cold."  He reports diffuse chest discomfort that radiates to both of his shoulders, not associated with any shortness of breath.  He does have associated nausea and diaphoresis.  He reports this pain has been near constant for the entire week, has not been exacerbated by anything in particular or relieved by nothing.  He was treated for an upper respiratory infection by his primary care physician without any relief.  He actually thinks the antibiotic he was treated with made the symptoms worse.  He has had subjective fevers at night, otherwise none in the day.  He denies any melena or hematochezia.  He has not had any abdominal pain, otherwise complete review of systems was performed and was negative except for as stated above.  PAST MEDICAL HISTORY: 1. Hypertension. 2. History of colon polyps. 3. Obesity. 4. GERD. 5. Chronic shortness of breath.  PAST SURGICAL HISTORY:  Angiogram of his right upper extremity for "blood clots.  No record of this in the chart.  Otherwise, he has had colonic polyps removed.  FAMILY HISTORY:  Sister with coronary artery disease at 62.  SOCIAL HISTORY:  He is a former heavy smoker approximately 40 years, quit several years ago.  Otherwise,  drinks no alcohol, no illicit drug use.  ALLERGIES:  Allergic to CODEINE.  HOME MEDICATIONS:  He is on 1. Amlodipine 5 mg p.o. daily. 2. Aspirin 81 mg p.o. daily. 3. Ceftin 250 mg p.o. b.i.d. 4. Prevacid 30 mg p.o. daily. 5. Metoprolol 12.5 mg p.o. b.i.d. 6. Tramadol p.r.n.  PHYSICAL EXAMINATION:  VITAL SIGNS:  He is afebrile.  Temperature 97.7, pulse of 59 and regular, respiratory rate of 20, blood pressure 132/81, O2 sats 95% on room air. GENERAL:  He is an obese white male, in no apparent distress. HEENT:  Eyes has anicteric sclerae.  Mucous membranes are moist. NECK:  Normal jugular venous pressure.  No carotid bruits. LUNGS:  He has faint bibasilar crackles, otherwise clear. CARDIOVASCULAR:  He has regular rate and rhythm.  No murmurs, rubs, or gallops. ABDOMEN:  Obese, soft, nontender.  No masses or bruits. EXTREMITIES:  Warm with no edema.  He has symmetrical pulses throughout. SKIN:  No rashes or ulcers. NEUROLOGIC:  Grossly afocal.  LABORATORY STUDIES:  Sodium 134, potassium 4.7, chloride of 97, bicarb of 26, BUN of 20, creatinine of 0.96, calcium of 9.7, glucose of 100. CK-MB is 13.4, CK  is 258, and troponin is 2.47.  White count is 14.8, hematocrit is 46, platelet count is 247.  Abdominal ultrasound performed in an outside ED was normal.  Chest x-ray shows cardiomegaly with interstitial prominence, no infiltrates or effusions.  EKG shows sinus bradycardia, rate of 57 beats per minute.  No acute ST changes.  IMPRESSION AND PLAN:  This is a 76 year old obese, hypertensive white male who is a former smoker, who presents with 1-week history of chest pain concerning for angina, has positive cardiac enzymes, will be treated for non-ST elevation myocardial infarction.  He has already been started on aspirin and heparin infusion.  In addition to this, we will add Integrilin infusion.  He is currently pain free.  We will continue his metoprolol at 12.5 mg p.o. b.i.d., and  asked high-dose statin to this.  We will follow serial enzymes and get an echocardiogram to evaluate his left ventricular systolic function in the morning.  Given his story and lab findings, we will proceed with angiography at some point in the near future.          ______________________________ Natasha Bence, MD     MH/MEDQ  D:  02/28/2012  T:  02/29/2012  Job:  [id number]

## 2012-02-29 NOTE — Progress Notes (Signed)
ANTICOAGULATION CONSULT NOTE - Follow Up Consult  Pharmacy Consult for : Heparin + Integrilin Indication: Chest pain / ACS  Allergies  Allergen Reactions  . Codeine     Patient Measurements: Height: 5\' 9"  (175.3 cm) Weight: 242 lb (109.77 kg) IBW/kg (Calculated) : 70.7    Vital Signs: Temp: 97.7 F (36.5 C) (03/23 1415) Temp src: Oral (03/23 1415) BP: 119/66 mmHg (03/23 1415) Pulse Rate: 66  (03/23 1415)  Labs:  Alvira Philips 02/29/12 2029 02/29/12 0900 02/29/12 0159 02/29/12 0158 02/28/12 2054 02/28/12 2053 02/28/12 1133  HGB -- -- -- 14.6 -- -- 15.3  HCT -- -- -- 42.0 -- -- 45.5  PLT -- -- -- 233 -- -- 247  APTT -- -- -- -- -- -- --  LABPROT -- -- -- -- 14.4 -- --  INR -- -- -- -- 1.10 -- --  HEPARINUNFRC 0.28* 0.25* -- 0.34 -- -- --  CREATININE -- -- -- -- -- -- 0.98  CKTOTAL -- 193 213 -- -- 223 --  CKMB -- 6.3* 7.7* -- -- 9.6* --  TROPONINI -- 3.14* 3.97* -- -- 3.49* --   Estimated Creatinine Clearance: 81.9 ml/min (by C-G formula based on Cr of 0.98).   Medications:  Scheduled:    . amLODipine  5 mg Oral Daily  . aspirin  324 mg Oral NOW   Or  . aspirin  300 mg Rectal NOW  . aspirin EC  81 mg Oral Daily  . atorvastatin  80 mg Oral q1800  . metoprolol tartrate  12.5 mg Oral BID  . sodium chloride  3 mL Intravenous Q12H   Infusions:    . sodium chloride    . eptifibatide 2 mcg/kg/min (02/29/12 2138)  . heparin 1,150 Units/hr (02/29/12 1506)    Assessment:  Patient is receiving Heparin and Integrilin infusions for NSTEMI.    Heparin infusing at 1150 units/hr, Integrilin infusing at 2 mcg/kg/min.  Heparin level slightly below planned therapeutic range.  0.28 units/ml.  WBC/Hgb/Hct/Plts:  12.8/14.6/42.0/233 (03/23 0158). No evidence of bleeding noted.  Goal of Therapy:  Heparin level of 0.3 - 0.5 while on concurrent Integrilin infusion.   Plan:  Increase Heparin to 1300 units/hr. Next Heparin level with Am labs. Continue Integrilin at current  rate.  Joseline Mccampbell, Elisha Headland, Pharm.D. 02/29/2012 9:41 PM

## 2012-02-29 NOTE — Progress Notes (Signed)
  Echocardiogram 2D Echocardiogram has been performed.  Arthur Aydelotte L 02/29/2012, 8:46 AM

## 2012-02-29 NOTE — Progress Notes (Signed)
  Patient Name: Tyler Mejia      SUBJECTIVE: Hiastory reveiwed history of chest pain for 5 days but none since yesterday morning.  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  He also has a history of class 2-3 dyspnea without peripheral edema nocturnal dyspnea or orthopnea  Past Medical History  Diagnosis Date  . GERD (gastroesophageal reflux disease)   . HTN (hypertension)   . Hx of adenomatous colonic polyps   . Kidney stones     PHYSICAL EXAM Filed Vitals:   02/28/12 1421 02/28/12 1553 02/28/12 1705 02/28/12 1900  BP: 113/65 127/77 132/71 132/81  Pulse: 52 52 59 59  Temp:      Resp: 16 19 19 20   Height:      Weight:      SpO2: 92% 92% 94% 95%    Well developed and nourished in no acute distress HENT normal Neck supple with JVP-flat Clear Regular rate and rhythm, no murmurs or gallops Abd-soft with active BS No Clubbing cyanosis edema Skin-warm and dry A & Oriented  Grossly normal sensory and motor function   TELEMETRY: Reviewed telemetry pt in nsr    Intake/Output Summary (Last 24 hours) at 02/29/12 0800 Last data filed at 02/29/12 0700  Gross per 24 hour  Intake      0 ml  Output    175 ml  Net   -175 ml    LABS: Basic Metabolic Panel:  Lab 02/28/12 1610  NA 134*  K 4.7  CL 97  CO2 26  GLUCOSE 100*  BUN 20  CREATININE 0.98  CALCIUM 9.7  MG --  PHOS --   Cardiac Enzymes:  Basename 02/29/12 0159 02/28/12 2053 02/28/12 1419 02/28/12 1133  CKTOTAL 213 223 258* --  CKMB 7.7* 9.6* 13.4* --  CKMBINDEX -- -- -- --  TROPONINI 3.97* 3.49* -- 2.47*   CBC:  Lab 02/29/12 0158 02/28/12 1133  WBC 12.8* 14.8*  NEUTROABS -- --  HGB 14.6 15.3  HCT 42.0 45.5  MCV 83.7 83.8  PLT 233 247   PROTIME:  Basename 02/28/12 2054  LABPROT 14.4  INR 1.10     ASSESSMENT AND PLAN:  Patient Active Hospital Problem List: NSTEMI  (non-ST elevated myocardial infarction) (02/28/2012)  GERD (gastroesophageal reflux disease) (02/28/2012)  Chest pain (02/28/2012)  HTN (hypertension) (02/28/2012)   With NSTEMI needs cath on Monday On BB ,ASA HEP  Will resume integrilin  precatrh ordersw written Signed, Sherryl Manges MD  02/29/2012

## 2012-02-29 NOTE — Progress Notes (Signed)
ANTICOAGULATION CONSULT NOTE - Initial Consult  Pharmacy Consult for Integrillin + Heparin Indication: chest pain/ACS  Allergies  Allergen Reactions  . Codeine     Patient Measurements: Height: 5\' 9"  (175.3 cm) Weight: 242 lb (109.77 kg) IBW/kg (Calculated) : 70.7  Heparin Dosing Weight: 94.8 kg  Vital Signs: BP: 132/81 mmHg (03/22 1900) Pulse Rate: 59  (03/22 1900)  Labs:  Alvira Philips 02/29/12 0159 02/29/12 0158 02/28/12 2054 02/28/12 2053 02/28/12 1419 02/28/12 1133  HGB -- 14.6 -- -- -- 15.3  HCT -- 42.0 -- -- -- 45.5  PLT -- 233 -- -- -- 247  APTT -- -- -- -- -- --  LABPROT -- -- 14.4 -- -- --  INR -- -- 1.10 -- -- --  HEPARINUNFRC -- 0.34 -- -- -- --  CREATININE -- -- -- -- -- 0.98  CKTOTAL 213 -- -- 223 258* --  CKMB 7.7* -- -- 9.6* 13.4* --  TROPONINI 3.97* -- -- 3.49* -- 2.47*   Estimated Creatinine Clearance: 81.9 ml/min (by C-G formula based on Cr of 0.98).  Assessment: 76 y.o.male with ACS for Heparin/Integrilin  Goal of Therapy:  Heparin level of 0.3-0.5 units/ml (while concurrently on Integrilin)  Plan: Continue Heparin at current rate Follow-up am labs.   Geannie Risen, PharmD, BCPS 02/29/2012 3:11 AM

## 2012-02-29 NOTE — Progress Notes (Signed)
ANTICOAGULATION CONSULT NOTE - Follow Up Consult  Pharmacy Consult for Integrillin + Heparin Indication: Chest pain/ACS  Allergies  Allergen Reactions  . Codeine     Patient Measurements: Height: 5\' 9"  (175.3 cm) Weight: 242 lb (109.77 kg) IBW/kg (Calculated) : 70.7  Heparin Dosing Weight:    Vital Signs: BP: 145/76 mmHg (03/23 1052) Pulse Rate: 64  (03/23 1052)  Labs:  Basename 02/29/12 0900 02/29/12 0159 02/29/12 0158 02/28/12 2054 02/28/12 2053 02/28/12 1133  HGB -- -- 14.6 -- -- 15.3  HCT -- -- 42.0 -- -- 45.5  PLT -- -- 233 -- -- 247  APTT -- -- -- -- -- --  LABPROT -- -- -- 14.4 -- --  INR -- -- -- 1.10 -- --  HEPARINUNFRC 0.25* -- 0.34 -- -- --  CREATININE -- -- -- -- -- 0.98  CKTOTAL 193 213 -- -- 223 --  CKMB 6.3* 7.7* -- -- 9.6* --  TROPONINI 3.14* 3.97* -- -- 3.49* --   Estimated Creatinine Clearance: 81.9 ml/min (by C-G formula based on Cr of 0.98).    Assessment:  Pt has NSTEMI. Will have cath on Mon.  Heparin level is 0.25, subtherapeutic. Goal of Therapy:  Heparin level 0.3-0.5   Plan:  Pt is on heparin and integrilin. Heparin is slightly subtherapeutic. Will increase rate to 1150 units/hr. Will recheck heparin level in 8 hrs.  Eugene Garnet 02/29/2012,11:50 AM

## 2012-03-01 LAB — HEPARIN LEVEL (UNFRACTIONATED): Heparin Unfractionated: 0.5 IU/mL (ref 0.30–0.70)

## 2012-03-01 LAB — CBC
Hemoglobin: 14.4 g/dL (ref 13.0–17.0)
MCHC: 34.4 g/dL (ref 30.0–36.0)
WBC: 11 10*3/uL — ABNORMAL HIGH (ref 4.0–10.5)

## 2012-03-01 NOTE — Progress Notes (Signed)
Patient Name: Tyler Mejia      SUBJECTIVE: History reveiwed history of chest pain for 5 days but none since friday. Admitted and found to have a non-STEMI .  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  He also has a history of class 2-3 dyspnea without peripheral edema nocturnal dyspnea or orthopnea  Past Medical History  Diagnosis Date  . GERD (gastroesophageal reflux disease)   . HTN (hypertension)   . Hx of adenomatous colonic polyps   . Kidney stones    Current Facility-Administered Medications  Medication Dose Route Frequency Provider Last Rate Last Dose  . 0.9 %  sodium chloride infusion  250 mL Intravenous PRN Duke Salvia, MD      . 0.9 %  sodium chloride infusion  1 mL/kg/hr Intravenous Continuous Duke Salvia, MD      . acetaminophen (TYLENOL) tablet 650 mg  650 mg Oral Q4H PRN Rosalyn Gess. Margo Aye, MD      . amLODipine (NORVASC) tablet 5 mg  5 mg Oral Daily Michael E. Margo Aye, MD   5 mg at 02/29/12 1052  . aspirin EC tablet 81 mg  81 mg Oral Daily Michael E. Margo Aye, MD   81 mg at 02/29/12 1052  . atorvastatin (LIPITOR) tablet 80 mg  80 mg Oral q1800 Michael E. Margo Aye, MD   80 mg at 02/29/12 1707  . eptifibatide (INTEGRILIN) 75 mg / 100 mL infusion  2 mcg/kg/min Intravenous Continuous Dolores Patty, MD 17.6 mL/hr at 03/01/12 0322 2 mcg/kg/min at 03/01/12 0322  . heparin ADULT infusion 100 units/mL (25000 units/250 mL)  1,300 Units/hr Intravenous Continuous Dolores Patty, MD 13 mL/hr at 03/01/12 0025 1,300 Units/hr at 03/01/12 0025  . metoprolol tartrate (LOPRESSOR) tablet 12.5 mg  12.5 mg Oral BID Rosalyn Gess. Margo Aye, MD   12.5 mg at 02/29/12 2134  . nitroGLYCERIN (NITROSTAT) SL tablet 0.4 mg  0.4 mg Sublingual Q5 Min x 3 PRN Rosalyn Gess. Margo Aye, MD      . ondansetron [facility]) injection 4 mg  4 mg Intravenous Q6H PRN Rosalyn Gess. Margo Aye, MD      . sodium  chloride 0.9 % injection 3 mL  3 mL Intravenous Q12H Duke Salvia, MD   3 mL at 02/29/12 2134  . sodium chloride 0.9 % injection 3 mL  3 mL Intravenous PRN Duke Salvia, MD           PHYSICAL EXAM Filed Vitals:   02/29/12 1415 02/29/12 1700 02/29/12 2100 03/01/12 0500  BP: 119/66  138/84 132/74  Pulse: 66  68 67  Temp: 97.7 F (36.5 C)  98.8 F (37.1 C) 99.2 F (37.3 C)  TempSrc: Oral  Oral Oral  Resp: 18  18 18   Height:      Weight:  242 lb (109.77 kg)    SpO2: 94%  94% 94%    Well developed and nourished in no acute distress HENT normal Neck supple with JVP-flat Clear Regular rate and rhythm, no murmurs or gallops Abd-soft with active BS No Clubbing cyanosis edema Skin-warm and dry A & Oriented  Grossly normal sensory and motor function   TELEMETRY: Reviewed telemetry pt in nsr    Intake/Output Summary (Last 24 hours) at 03/01/12 0841 Last data filed at 03/01/12 0500  Gross per 24 hour  Intake 1647.52 ml  Output    400 ml  Net 1247.52 ml    LABS: Basic Metabolic Panel:  Lab 02/28/12 9604  NA 134*  K 4.7  CL 97  CO2 26  GLUCOSE 100*  BUN 20  CREATININE 0.98  CALCIUM 9.7  MG --  PHOS --   Cardiac Enzymes:  Basename 02/29/12 0900 02/29/12 0159 02/28/12 2053  CKTOTAL 193 213 223  CKMB 6.3* 7.7* 9.6*  CKMBINDEX -- -- --  TROPONINI 3.14* 3.97* 3.49*   CBC:  Lab 03/01/12 0710 02/29/12 0158 02/28/12 1133  WBC 11.0* 12.8* 14.8*  NEUTROABS -- -- --  HGB 14.4 14.6 15.3  HCT 41.9 42.0 45.5  MCV 84.0 83.7 83.8  PLT 216 233 247   PROTIME:  Basename 02/28/12 2054  LABPROT 14.4  INR 1.10     ASSESSMENT AND PLAN:  Patient Active Hospital Problem List: NSTEMI (non-ST elevated myocardial infarction) (02/28/2012)  GERD (gastroesophageal reflux disease) (02/28/2012)  Chest pain (02/28/2012)  HTN (hypertension) (02/28/2012)   With NSTEMI needs cath on Monday On BB ,ASA HEP  Will resume integrilin  precath orders written Signed, Sherryl Manges MD  03/01/2012

## 2012-03-01 NOTE — Progress Notes (Signed)
I agree with medication administration done by Tommi Rumps nursing student with Haroldine Laws

## 2012-03-01 NOTE — Progress Notes (Signed)
ANTICOAGULATION CONSULT NOTE - Follow Up Consult  Pharmacy Consult for Heparin + Integrilin Indication: nonSTEMI  Allergies  Allergen Reactions  . Codeine     Patient Measurements: Height: 5\' 9"  (175.3 cm) Weight: 242 lb (109.77 kg) IBW/kg (Calculated) : 70.7  Heparin Dosing Weight:   Vital Signs: Temp: 99.2 F (37.3 C) (03/24 0500) Temp src: Oral (03/24 0500) BP: 132/74 mmHg (03/24 0500) Pulse Rate: 67  (03/24 0500)  Labs:  Alvira Philips 03/01/12 0710 02/29/12 2029 02/29/12 0900 02/29/12 0159 02/29/12 0158 02/28/12 2054 02/28/12 2053 02/28/12 1133  HGB 14.4 -- -- -- 14.6 -- -- --  HCT 41.9 -- -- -- 42.0 -- -- 45.5  PLT 216 -- -- -- 233 -- -- 247  APTT -- -- -- -- -- -- -- --  LABPROT -- -- -- -- -- 14.4 -- --  INR -- -- -- -- -- 1.10 -- --  HEPARINUNFRC 0.50 0.28* 0.25* -- -- -- -- --  CREATININE -- -- -- -- -- -- -- 0.98  CKTOTAL -- -- 193 213 -- -- 223 --  CKMB -- -- 6.3* 7.7* -- -- 9.6* --  TROPONINI -- -- 3.14* 3.97* -- -- 3.49* --   Estimated Creatinine Clearance: 81.9 ml/min (by C-G formula based on Cr of 0.98).   Assessment: Heparin level is 0.5. Plan for cardiac cath on Mon. Integrelin also infusing. Goal of Therapy:  Heparin level 0.3-0.5   Plan:  Decrease heparin to 1250 units/hr  Next heparin level with AM labs Continue Integrilin at current rate.  Eugene Garnet 03/01/2012,11:21 AM

## 2012-03-02 ENCOUNTER — Other Ambulatory Visit: Payer: Self-pay

## 2012-03-02 ENCOUNTER — Encounter (HOSPITAL_COMMUNITY)
Admission: EM | Disposition: A | Discharge status: Home / Self Care | Payer: Self-pay | Source: Home / Self Care | Attending: Internal Medicine

## 2012-03-02 DIAGNOSIS — I2510 Atherosclerotic heart disease of native coronary artery without angina pectoris: Secondary | ICD-10-CM

## 2012-03-02 DIAGNOSIS — I214 Non-ST elevation (NSTEMI) myocardial infarction: Secondary | ICD-10-CM

## 2012-03-02 HISTORY — PX: LEFT HEART CATHETERIZATION WITH CORONARY ANGIOGRAM: SHX5451

## 2012-03-02 HISTORY — PX: PERCUTANEOUS CORONARY STENT INTERVENTION (PCI-S): SHX5485

## 2012-03-02 LAB — POCT ACTIVATED CLOTTING TIME
Activated Clotting Time: 298 seconds
Activated Clotting Time: 149 seconds

## 2012-03-02 LAB — CBC
HCT: 41.1 % (ref 39.0–52.0)
Platelets: 211 10*3/uL (ref 150–400)
RDW: 13 % (ref 11.5–15.5)
WBC: 9.7 10*3/uL (ref 4.0–10.5)

## 2012-03-02 LAB — HEPARIN LEVEL (UNFRACTIONATED): Heparin Unfractionated: 0.28 IU/mL — ABNORMAL LOW (ref 0.30–0.70)

## 2012-03-02 SURGERY — LEFT HEART CATHETERIZATION WITH CORONARY ANGIOGRAM
Anesthesia: LOCAL

## 2012-03-02 MED ORDER — TRAMADOL HCL 50 MG PO TABS
50.0000 mg | ORAL_TABLET | Freq: Four times a day (QID) | ORAL | Status: DC | PRN
  Administered 2012-03-02: 50 mg via ORAL
  Filled 2012-03-02: qty 1

## 2012-03-02 MED ORDER — HEPARIN (PORCINE) IN NACL 2-0.9 UNIT/ML-% IJ SOLN
INTRAMUSCULAR | Status: AC
  Filled 2012-03-02: qty 2000

## 2012-03-02 MED ORDER — CLOPIDOGREL BISULFATE 75 MG PO TABS
75.0000 mg | ORAL_TABLET | Freq: Every day | ORAL | Status: DC
  Administered 2012-03-03: 75 mg via ORAL
  Filled 2012-03-02: qty 1

## 2012-03-02 MED ORDER — SODIUM CHLORIDE 0.9 % IV SOLN
1.0000 mL/kg/h | INTRAVENOUS | Status: AC
  Administered 2012-03-02: 1 mL/kg/h via INTRAVENOUS

## 2012-03-02 MED ORDER — HEPARIN SODIUM (PORCINE) 1000 UNIT/ML IJ SOLN
INTRAMUSCULAR | Status: AC
  Filled 2012-03-02: qty 1

## 2012-03-02 MED ORDER — LIDOCAINE HCL (PF) 1 % IJ SOLN
INTRAMUSCULAR | Status: AC
  Filled 2012-03-02: qty 30

## 2012-03-02 MED ORDER — NITROGLYCERIN 0.2 MG/ML ON CALL CATH LAB
INTRAVENOUS | Status: AC
  Filled 2012-03-02: qty 1

## 2012-03-02 MED ORDER — FENTANYL CITRATE 0.05 MG/ML IJ SOLN
INTRAMUSCULAR | Status: AC
  Filled 2012-03-02: qty 2

## 2012-03-02 MED ORDER — MIDAZOLAM HCL 2 MG/2ML IJ SOLN
INTRAMUSCULAR | Status: AC
  Filled 2012-03-02: qty 2

## 2012-03-02 NOTE — Interval H&P Note (Signed)
History and Physical Interval Note:  03/02/2012 7:38 AM  Tyler Mejia  has presented today for surgery, with the diagnosis of NSTEMI.  The various methods of treatment have been discussed with the patient and family. After consideration of risks, benefits and other options for treatment, the patient has consented to  Procedure(s) (LRB): LEFT HEART CATHETERIZATION WITH CORONARY ANGIOGRAM (N/A) as a surgical intervention .  The patients' history has been reviewed, patient examined, no change in status, stable for surgery.  I have reviewed the patients' chart and labs.  Questions were answered to the patient's satisfaction.     Theron Arista [facility] 03/02/2012 7:38 AM

## 2012-03-02 NOTE — Progress Notes (Signed)
I agree with the assessment and medication administration done by Tommi Rumps nursing student with [employer] Laws.

## 2012-03-02 NOTE — Progress Notes (Signed)
Site area: right groin  Site Prior to Removal:  Level 1  Pressure Applied For 20 MINUTES    Minutes Beginning at 1335  Manual:   yes  Patient Status During Pull:  stable  Post Pull Groin Site:  Level 1  Post Pull Instructions Given:  yes  Post Pull Pulses Present:  yes  Dressing Applied:  yes  Comments:      TR BAND REMOVAL  LOCATION:  right radial  DEFLATED PER PROTOCOL:  yes  TIME BAND OFF / DRESSING APPLIED:   1345   SITE UPON ARRIVAL:   Level 1  SITE AFTER BAND REMOVAL:  Level 1  REVERSE ALLEN'S TEST:    positive  CIRCULATION SENSATION AND MOVEMENT:  Within Normal Limits  yes  COMMENTS:

## 2012-03-02 NOTE — Progress Notes (Signed)
Faxed to PCP

## 2012-03-02 NOTE — H&P (View-Only) (Signed)
Patient Name: Tyler Mejia      SUBJECTIVE: History reveiwed history of chest pain for 5 days but none since friday. Admitted and found to have a non-STEMI .  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  He also has a history of class 2-3 dyspnea without peripheral edema nocturnal dyspnea or orthopnea  Past Medical History  Diagnosis Date  . GERD (gastroesophageal reflux disease)   . HTN (hypertension)   . Hx of adenomatous colonic polyps   . Kidney stones    Current Facility-Administered Medications  Medication Dose Route Frequency Provider Last Rate Last Dose  . 0.9 %  sodium chloride infusion  250 mL Intravenous PRN Bresha Hosack C Aerionna Moravek, MD      . 0.9 %  sodium chloride infusion  1 mL/kg/hr Intravenous Continuous Denise Bramblett C Sharley Keeler, MD      . acetaminophen (TYLENOL) tablet 650 mg  650 mg Oral Q4H PRN Michael E. Hall, MD      . amLODipine (NORVASC) tablet 5 mg  5 mg Oral Daily Michael E. Hall, MD   5 mg at 02/29/12 1052  . aspirin EC tablet 81 mg  81 mg Oral Daily Michael E. Hall, MD   81 mg at 02/29/12 1052  . atorvastatin (LIPITOR) tablet 80 mg  80 mg Oral q1800 Michael E. Hall, MD   80 mg at 02/29/12 1707  . eptifibatide (INTEGRILIN) 75 mg / 100 mL infusion  2 mcg/kg/min Intravenous Continuous Daniel R Bensimhon, MD 17.6 mL/hr at 03/01/12 0322 2 mcg/kg/min at 03/01/12 0322  . heparin ADULT infusion 100 units/mL (25000 units/250 mL)  1,300 Units/hr Intravenous Continuous Daniel R Bensimhon, MD 13 mL/hr at 03/01/12 0025 1,300 Units/hr at 03/01/12 0025  . metoprolol tartrate (LOPRESSOR) tablet 12.5 mg  12.5 mg Oral BID Michael E. Hall, MD   12.5 mg at 02/29/12 2134  . nitroGLYCERIN (NITROSTAT) SL tablet 0.4 mg  0.4 mg Sublingual Q5 Min x 3 PRN Michael E. Hall, MD      . ondansetron (ZOFRAN) injection 4 mg  4 mg Intravenous Q6H PRN Michael E. Hall, MD      . sodium  chloride 0.9 % injection 3 mL  3 mL Intravenous Q12H Yahye Siebert C Kyoko Elsea, MD   3 mL at 02/29/12 2134  . sodium chloride 0.9 % injection 3 mL  3 mL Intravenous PRN Kuper Rennels C Savian Mazon, MD           PHYSICAL EXAM Filed Vitals:   02/29/12 1415 02/29/12 1700 02/29/12 2100 03/01/12 0500  BP: 119/66  138/84 132/74  Pulse: 66  68 67  Temp: 97.7 F (36.5 C)  98.8 F (37.1 C) 99.2 F (37.3 C)  TempSrc: Oral  Oral Oral  Resp: 18  18 18  Height:      Weight:  242 lb (109.77 kg)    SpO2: 94%  94% 94%    Well developed and nourished in no acute distress HENT normal Neck supple with JVP-flat Clear Regular rate and rhythm, no murmurs or gallops Abd-soft with active BS No Clubbing cyanosis edema Skin-warm and dry A & Oriented  Grossly normal sensory and motor function   TELEMETRY: Reviewed telemetry pt in nsr    Intake/Output Summary (Last 24 hours) at 03/01/12 0841 Last data filed at 03/01/12 0500  Gross per 24 hour  Intake 1647.52 ml  Output    400 ml    Net 1247.52 ml    LABS: Basic Metabolic Panel:  Lab 02/28/12 1133  NA 134*  K 4.7  CL 97  CO2 26  GLUCOSE 100*  BUN 20  CREATININE 0.98  CALCIUM 9.7  MG --  PHOS --   Cardiac Enzymes:  Basename 02/29/12 0900 02/29/12 0159 02/28/12 2053  CKTOTAL 193 213 223  CKMB 6.3* 7.7* 9.6*  CKMBINDEX -- -- --  TROPONINI 3.14* 3.97* 3.49*   CBC:  Lab 03/01/12 0710 02/29/12 0158 02/28/12 1133  WBC 11.0* 12.8* 14.8*  NEUTROABS -- -- --  HGB 14.4 14.6 15.3  HCT 41.9 42.0 45.5  MCV 84.0 83.7 83.8  PLT 216 233 247   PROTIME:  Basename 02/28/12 2054  LABPROT 14.4  INR 1.10     ASSESSMENT AND PLAN:  Patient Active Hospital Problem List: NSTEMI (non-ST elevated myocardial infarction) (02/28/2012)  GERD (gastroesophageal reflux disease) (02/28/2012)  Chest pain (02/28/2012)  HTN (hypertension) (02/28/2012)   With NSTEMI needs cath on Monday On BB ,ASA HEP  Will resume integrilin  precath orders written Signed, Gerard Cantara MD  03/01/2012   

## 2012-03-02 NOTE — CV Procedure (Signed)
  Cardiac Catheterization Procedure Note  Name: Tyler Mejia MRN: [id number] DOB: [birth year]  Procedure: Left Heart Cath, Selective Coronary Angiography, LV angiography,  PTCA/Stent of distal right coronary  Indication: 76 year old white male with history of hypertension who presents with a non-ST elevation myocardial infarction.   Diagnostic Procedure Details: The right groin was prepped, draped, and anesthetized with 1% lidocaine. Using the modified Seldinger technique, a 5 French sheath was introduced into the right femoral artery. Standard Judkins catheters were used for selective coronary angiography and left ventriculography. Catheter exchanges were performed over a wire.  The diagnostic procedure was well-tolerated without immediate complications.  PROCEDURAL FINDINGS Hemodynamics: AO 138/81 with a mean of 108 mmHg LV 141/23 mmHg  Coronary angiography: Coronary dominance: right  Left mainstem: Normal.  Left anterior descending (LAD): There is a 50% stenosis at the LAD diagonal bifurcation. There is 30% narrowing in the mid LAD. The first diagonal is a relatively small branch without significant disease.  Left circumflex (LCx): The left circumflex gives rise to 2 marginal branches. The first marginal branch has a focal 70-80% stenosis in the proximal vessel. The mid left circumflex coronary has a 60-70% stenosis.  Right coronary artery (RCA): The right coronary is a dominant vessel. It has a long segmental stenosis in the distal vessel up to 95%. The mid right coronary was diffusely diseased up to 20-30%.  Left ventriculography: Left ventricular systolic function is normal, LVEF is estimated at 55-65%, there is no significant mitral regurgitation   PCI Procedure Note:  Following the diagnostic procedure, the decision was made to proceed with PCI. The sheath was upsized to a 6 [place]. Weight-based heparin was given for anticoagulation. IV Integrilin was continued. Plavix 600 mg by  mouth was given. Once a therapeutic ACT was achieved, a 6 [place] JR 4 guide catheter was inserted.  A pro-water coronary guidewire was used to cross the lesion.  The lesion was predilated with a 2.0 mm compliant balloon.  The lesion was then stented with a 2.5 x 38 mm Promus element stent.  The stent was postdilated with a 2.75 mm noncompliant balloon.  Following PCI, there was 0% residual stenosis and TIMI-3 flow. Final angiography confirmed an excellent result. Femoral hemostasis was achieved with manual compression.  The patient tolerated the PCI procedure well. There were no immediate procedural complications.  The patient was transferred to the post catheterization recovery area for further monitoring.  PCI Data: Vessel - RCA/Segment - distal Percent Stenosis (pre)  95% TIMI-flow 2 Stent 2.5 x 38 mm Promus element Percent Stenosis (post) 0% TIMI-flow (post) 3  Final Conclusions:   1. 2 vessel obstructive atherosclerotic coronary disease. The distal right coronary is clearly the culprit vessel with a severe segmental stenosis. There is moderate obstructive disease in the first obtuse marginal vessel and mid circumflex. The LAD has borderline disease at the diagonal bifurcation. 2. Overall good left ventricular function. 3. Successful stenting of the distal right coronary with a drug-eluting stent.  Recommendations: Recommend dual antiplatelet therapy for one year. I would treat his other disease medically at this time but would consider functional study later to evaluate the circumflex and LAD distribution.  Tyler Mejia 03/02/2012, 9:02 AM

## 2012-03-03 ENCOUNTER — Other Ambulatory Visit: Payer: Self-pay

## 2012-03-03 LAB — CBC
HCT: 40.3 % (ref 39.0–52.0)
MCHC: 34.2 g/dL (ref 30.0–36.0)
MCV: 83.6 fL (ref 78.0–100.0)
Platelets: 224 10*3/uL (ref 150–400)
RDW: 13 % (ref 11.5–15.5)
WBC: 11.9 10*3/uL — ABNORMAL HIGH (ref 4.0–10.5)

## 2012-03-03 LAB — BASIC METABOLIC PANEL
BUN: 15 mg/dL (ref 6–23)
Chloride: 100 mEq/L (ref 96–112)
Creatinine, Ser: 0.92 mg/dL (ref 0.50–1.35)
GFR calc Af Amer: >90 mL/min (ref >90)
GFR calc non Af Amer: 82 mL/min — ABNORMAL LOW (ref >90)
Potassium: 4.2 mEq/L (ref 3.5–5.1)

## 2012-03-03 MED ORDER — NITROGLYCERIN 0.4 MG SL SUBL: 0.4000 mg | SUBLINGUAL_TABLET | SUBLINGUAL | Status: DC | PRN

## 2012-03-03 MED ORDER — CLOPIDOGREL BISULFATE 75 MG PO TABS: 75.0000 mg | ORAL_TABLET | Freq: Every day | ORAL | Status: DC

## 2012-03-03 MED ORDER — ATORVASTATIN CALCIUM 80 MG PO TABS: 80.0000 mg | ORAL_TABLET | Freq: Every day | ORAL | Status: DC

## 2012-03-03 NOTE — Discharge Summary (Signed)
Discharge Summary   Patient ID: Tyler Mejia MRN: [id number], DOB/AGE: Apr 05, 1936 76 y.o.  Primary MD: Carylon Perches, MD Primary Cardiologist: Dr. [place]  Admit date: 02/28/2012 D/C date:     03/03/2012     Primary Discharge Diagnoses:  1. NSTEMI & newly diagnosed Coronary Artery Disease  - Echo 02/29/12 revealed mildly reduced LV systolic function w/ EF 45% to 50% and akinesis of the basal-midinferior myocardium.  - Cardiac cath 03/02/12 revealed 2V CAD, EF 55-65%, s/p PTCA/DES to distal RCA  - Recommend DAPT w/ ASA & Plavix for one year and f/u outpatient functional study to evaluate LCx & LAD distribution   - LDL 75, Initiated on statin, will need f/u lipids & LFTs 6-8wks  Secondary Discharge Diagnoses:  1. HTN 2. Obesity 3. GERD 4. Colon Polyps 5. H/o tobacco abuse  Allergies Allergies  Allergen Reactions  . Codeine     Diagnostic Studies/Procedures:   03/02/12 - Cardiac Cath Hemodynamics:  AO 138/81 with a mean of 108 mmHg  LV 141/23 mmHg  Coronary angiography:  Coronary dominance: right  Left mainstem: Normal.  Left anterior descending (LAD): There is a 50% stenosis at the LAD diagonal bifurcation. There is 30% narrowing in the mid LAD. The first diagonal is a relatively small branch without significant disease.  Left circumflex (LCx): The left circumflex gives rise to 2 marginal branches. The first marginal branch has a focal 70-80% stenosis in the proximal vessel. The mid left circumflex coronary has a 60-70% stenosis.  Right coronary artery (RCA): The right coronary is a dominant vessel. It has a long segmental stenosis in the distal vessel up to 95%. The mid right coronary was diffusely diseased up to 20-30%.  Left ventriculography: Left ventricular systolic function is normal, LVEF is estimated at 55-65%, there is no significant mitral regurgitation  Final Conclusions:  1. 2 vessel obstructive atherosclerotic coronary disease. The distal right coronary is clearly  the culprit vessel with a severe segmental stenosis. There is moderate obstructive disease in the first obtuse marginal vessel and mid circumflex. The LAD has borderline disease at the diagonal bifurcation.  2. Overall good left ventricular function.  3. Successful stenting of the distal right coronary with 2.5 x 38 mm Promus element drug-eluting stent.  Recommendations: Recommend dual antiplatelet therapy for one year. I would treat his other disease medically at this time but would consider functional study later to evaluate the circumflex and LAD distribution.  02/29/12 - 2D Echocardiogram Study Conclusions: Left ventricle: The cavity size was normal. Wall thickness was normal. Systolic function was mildly reduced. The estimated ejection fraction was in the range of 45% to 50%. Akinesis of the basal-midinferior myocardium.  02/28/2012 - Abd US Findings:  Gallbladder:  The gallbladder is visualized and no gallstones are noted.  There is no pain over the gallbladder with compression.  Common bile duct:  The common bile duct is normal measuring 2.7 mm in diameter.  Liver:  The liver has a normal echogenic pattern.  No ductal dilatation is seen.  IVC:  Appears normal.  Pancreas:  No focal abnormality seen.  Spleen:  The spleen is within normal limits measuring 7.8 cm sagittally.  Right Kidney:  No hydronephrosis is seen.  The right kidney measures 12.7 cm sagittally.  Left Kidney:  No hydronephrosis is noted.  The left kidney measures 12.7 cm.  Abdominal aorta:  The abdominal aorta is normal in caliber.  IMPRESSION: Negative abdominal ultrasound.  No gallstones.  No ductal dilatation.  History of Present Illness: 76 y.o. male w/ PMHx significant for obesity, GERD, HTN, and no known cardiac history who presented to [facility] on 02/28/12 after being seen in clinic by GI and had symptoms concerning for angina.  He initially to his primary care physician's office with a chief complaint of an upper  respiratory infection and diffuse chest discomfort. He reported having chronic shortness of breath, but over the last several days, had increasing fatigue. He reported diffuse chest discomfort that radiates to both of his shoulders, not associated with any shortness of breath, but was associated with nausea and diaphoresis. He reported this pain was near constant for the entire week. He was treated for an upper respiratory infection by his primary care physician without any relief. He then presented to his GI office and was told to go to the ED for symptoms concerning for angina.  Hospital Course: In the ED, EKG revealed Sinus bradycardia, 57bpm, no acute ST changes. CXR showed cardiomegaly, but otherwise no acute abnormalities. Labs were significant for Troponin 2.47. Abd [place] was negative for acute findings. He was admitted with an NSTEMI and started on heparin and integrilin.  Echocardiogram revealed mildly reduced LV systolic function, EF 45-50%, and akinesis of the basal-midinferior myocardium. Cardiac enzymes were cycled with peak troponin 3.97. He remained hemodynamically stable without further chest pain. He underwent cardiac catheterization on 03/02/12 with findings as noted above and placement of DES to distal RCA. EF was 55-65% by cath. He tolerated the procedure well without complications. Recommendations were made for DAPT w/ ASA & Plavix for one year and f/u functional study to evaluate LCx & LAD distribution. He was initiated on a statin and will need f/u lipids and LFTs in 6-8wks. An ACEI was not started during this admission, but could be considered instead of Norvasc if felt necessary.  He was seen and evaluated by Dr. Riley Kill who felt he was stable for discharge home with plans for follow up as scheduled below.  Discharge Vitals: Blood pressure 117/73, pulse 90, temperature 98.1 F (36.7 C), temperature source Oral, resp. rate 18, height 5\' 9"  (1.753 m), weight 239 lb 3.2 oz (108.5 kg), SpO2  96.00%.  Labs: Component Value Date   WBC 11.9* 03/03/2012   HGB 13.8 03/03/2012   HCT 40.3 03/03/2012   MCV 83.6 03/03/2012   PLT 224 03/03/2012    Lab 03/03/12 0412  NA 136  K 4.2  CL 100  CO2 26  BUN 15  CREATININE 0.92  CALCIUM 9.1  GLUCOSE 91   Component Value Date   CHOL 124 02/29/2012   HDL 32* 02/29/2012   LDLCALC 75 02/29/2012   TRIG 87 02/29/2012     02/28/2012 11:33 02/28/2012 14:19 02/28/2012 20:53 02/29/2012 01:59 02/29/2012 09:00  CK, MB  13.4 (HH) 9.6 (HH) 7.7 (HH) 6.3 (HH)  CK Total  258 (H) 223 213 193  Troponin I 2.47 (HH)  3.49 (HH) 3.97 (HH) 3.14 (HH)    Discharge Medications   Medication List  As of 03/03/2012  2:38 PM   STOP taking these medications         cefUROXime 250 MG tablet         TAKE these medications         amLODipine 5 MG tablet   Commonly known as: NORVASC   Take 5 mg by mouth daily.      aspirin 81 MG tablet   Take 81 mg by mouth daily.  atorvastatin 80 MG tablet   Commonly known as: LIPITOR   Take 1 tablet (80 mg total) by mouth at bedtime.      clopidogrel 75 MG tablet   Commonly known as: PLAVIX   Take 1 tablet (75 mg total) by mouth daily with breakfast.      diphenoxylate-atropine 2.5-0.025 MG per tablet   Commonly known as: LOMOTIL   Take 1 tablet by mouth 4 (four) times daily as needed.      lansoprazole 30 MG capsule   Commonly known as: PREVACID   Take 30 mg by mouth daily.      metoprolol tartrate 25 MG tablet   Commonly known as: LOPRESSOR   Take 12.5 mg by mouth 2 (two) times daily.      nitroGLYCERIN 0.4 MG SL tablet   Commonly known as: NITROSTAT   Place 1 tablet (0.4 mg total) under the tongue every 5 (five) minutes x 3 doses as needed for chest pain (up to 3 doses).      traMADol 50 MG tablet   Commonly known as: ULTRAM   Take 50 mg by mouth every 6 (six) hours as needed.            Disposition   Discharge Orders    Future Appointments: Provider: Department: Dept Phone: Center:   03/11/2012  9:45 AM Rosalio Macadamia, NP [facility] [phone] None     Future Orders Please Complete By Expires   Diet - low sodium heart healthy      Increase activity slowly      Discharge instructions      Comments:   **PLEASE REMEMBER TO BRING ALL OF YOUR MEDICATIONS TO EACH OF YOUR FOLLOW-UP OFFICE VISITS.  * KEEP GROIN SITE CLEAN AND DRY. Call the office for any signs of bleedings, pus, swelling, increased pain, or any other concerns. * NO HEAVY LIFTING (>10lbs) X 2 WEEKS. * NO SEXUAL ACTIVITY X 2 WEEKS. * NO DRIVING X 2 WEEKS * NO SOAKING BATHS, HOT TUBS, POOLS, ETC., X 7 DAYS.      Follow-up Information    Follow up with Norma Fredrickson, NP on 03/11/2012. (9:45)    Contact information:   [facility] [address] [place] [address] [phone]           Outstanding Labs/Studies:  1. Patient will require follow up lipid panel and liver function tests in 6-8 weeks as we initiated a new statin during this hospitalization    Duration of Discharge Encounter: Greater than 30 minutes including physician and PA time.  Signed, Jaleena Viviani PA-C 03/03/2012, 2:38 PM

## 2012-03-03 NOTE — Progress Notes (Signed)
Subjective:  Feels good.  No chest pain.  No pain actually since Friday.    Objective:  Vital Signs in the last 24 hours: Temp:  [97.9 F (36.6 C)-99.1 F (37.3 C)] 99.1 F (37.3 C) (03/26 0503) Pulse Rate:  [71-95] 71  (03/26 0503) Resp:  [15-20] 17  (03/26 0503) BP: (94-137)/(61-77) 119/61 mmHg (03/26 0503) SpO2:  [94 %-97 %] 95 % (03/26 0503) Weight:  [239 lb 3.2 oz (108.5 kg)] 239 lb 3.2 oz (108.5 kg) (03/26 0011)  Intake/Output from previous day: 03/25 0701 - 03/26 0700 In: 680 [P.O.:480; I.V.:200] Out: 500 [Urine:500]   Physical Exam: General: Well developed, well nourished, in no acute distress. Head:  Normocephalic and atraumatic. Lungs: Clear to auscultation and percussion. Heart: Normal S1 and S2.  No murmur, rubs or gallops.  Pulses: Pulses normal in all 4 extremities.  R wrist looks good.  Minimal swelling.  Extremities: No clubbing or cyanosis. No edema. Neurologic: Alert and oriented x 3.    Lab Results:  Basename 03/03/12 0412 03/02/12 0710  WBC 11.9* 9.7  HGB 13.8 13.7  PLT 224 211    Basename 03/03/12 0412  NA 136  K 4.2  CL 100  CO2 26  GLUCOSE 91  BUN 15  CREATININE 0.92    Basename 02/29/12 0900  TROPONINI 3.14*   Hepatic Function Panel No results found for this basename: PROT,ALBUMIN,AST,ALT,ALKPHOS,BILITOT,BILIDIR,IBILI in the last 72 hours No results found for this basename: CHOL in the last 72 hours No results found for this basename: PROTIME in the last 72 hours  Imaging: No results found.  EKG:  Delay in R wave progression.  Minor T flattening.   Cardiac Studies:  See cath report.  Ventricular couplet times one.  Assessment/Plan:  Patient Active Hospital Problem List: NSTEMI (non-ST elevated myocardial infarction) (02/28/2012)   Assessment: SP PCI of RCA with 2 vessel CAD   Plan: DAPT for one year.  Follow up in one-two weeks with Dr. Sw[place].  No driving for two weeks.   GERD (gastroesophageal reflux disease) (02/28/2012)  Assessment: May have been AP   Plan: consider pepcid HTN (hypertension) (02/28/2012)   Assessment: stable overall   Plan: continue medical therapy.  Consider increasing beta blockers as OP.  Follow up lipid and liver profile needed.    More than thirty minutes combined discharge time.    Shawnie Pons 8:43 AM 03/03/2012            Shawnie Pons, MD, [facility], FSC[facility] 03/03/2012, 8:37 AM

## 2012-03-03 NOTE — Progress Notes (Signed)
CARDIAC REHAB PHASE I   PRE:  Rate/Rhythm: 81 SR  BP:  Supine: 118/66  Sitting:   Standing:    SaO2: 94 RA  MODE:  Ambulation: 680 ft   POST:  Rate/Rhythem: 92 SR  BP:  Supine:   Sitting: 139/70  Standing:    SaO2: 94 RA 0800-0915 Assisted X 1 to ambulate. Gait steady. Pt c/o of tender right groin and right arm. Right arm swollen,elevated on pillow. No c/o with walking VS sable To recliner after walk with call light in reach. Completed MI education with pt. He agrees to [employer]. CRP in [place], will send referral.  Tyler Mejia

## 2012-03-04 LAB — POCT ACTIVATED CLOTTING TIME: Activated Clotting Time: 204 seconds

## 2012-03-11 ENCOUNTER — Ambulatory Visit (INDEPENDENT_AMBULATORY_CARE_PROVIDER_SITE_OTHER): Payer: Medicare Other | Admitting: Nurse Practitioner

## 2012-03-11 ENCOUNTER — Encounter: Payer: Self-pay | Admitting: Nurse Practitioner

## 2012-03-11 VITALS — BP 128/72 | HR 70 | Ht 69.0 in | Wt 234.0 lb

## 2012-03-11 DIAGNOSIS — I214 Non-ST elevation (NSTEMI) myocardial infarction: Secondary | ICD-10-CM

## 2012-03-11 DIAGNOSIS — I10 Essential (primary) hypertension: Secondary | ICD-10-CM

## 2012-03-11 LAB — BASIC METABOLIC PANEL
BUN: 25 mg/dL — ABNORMAL HIGH (ref 6–23)
CO2: 27 mEq/L (ref 19–32)
Calcium: 9.5 mg/dL (ref 8.4–10.5)
Chloride: 101 mEq/L (ref 96–112)
Creatinine, Ser: 0.9 mg/dL (ref 0.4–1.5)
GFR: 86.67 mL/min (ref >60.00)
Glucose, Bld: 186 mg/dL — ABNORMAL HIGH (ref 70–99)
Potassium: 4.3 mEq/L (ref 3.5–5.1)
Sodium: 138 mEq/L (ref 135–145)

## 2012-03-11 LAB — CBC WITH DIFFERENTIAL/PLATELET
Basophils Absolute: 0.1 10*3/uL (ref 0.0–0.1)
Basophils Relative: 0.6 % (ref 0.0–3.0)
Eosinophils Absolute: 0.3 10*3/uL (ref 0.0–0.7)
Eosinophils Relative: 3.5 % (ref 0.0–5.0)
HCT: 42 % (ref 39.0–52.0)
Hemoglobin: 14.1 g/dL (ref 13.0–17.0)
Lymphocytes Relative: 19.1 % (ref 12.0–46.0)
Lymphs Abs: 1.8 10*3/uL (ref 0.7–4.0)
MCHC: 33.7 g/dL (ref 30.0–36.0)
MCV: 86.4 fl (ref 78.0–100.0)
Monocytes Absolute: 0.9 10*3/uL (ref 0.1–1.0)
Monocytes Relative: 9.6 % (ref 3.0–12.0)
Neutro Abs: 6.2 10*3/uL (ref 1.4–7.7)
Neutrophils Relative %: 67.2 % (ref 43.0–77.0)
Platelets: 279 10*3/uL (ref 150.0–400.0)
RBC: 4.86 Mil/uL (ref 4.22–5.81)
RDW: 13.1 % (ref 11.5–14.6)
WBC: 9.3 10*3/uL (ref 4.5–10.5)

## 2012-03-11 NOTE — Patient Instructions (Signed)
Stay on your current medicines.  We are going to check some labs today.  Keep working your right hand - open and close  Start walking 15 minutes a day - add a minute a day with a goal up to 45 to 60 minutes each day.  We will see you in a month with fasting labs.  Call the [facility] office at [phone] if you have any questions, problems or concerns.

## 2012-03-11 NOTE — Progress Notes (Signed)
Tyler Mejia Date of Birth: Dec 26, 1935 Medical Record #[id number]  History of Present Illness: Tyler Mejia is seen back today for a post hospital visit. He is seen with Dr. Swaziland. He has had a recent NSTEMI with DES to the RCA. He is committed to Plavix for one year. He does have some residual disease in a marginal branch and in the LCX. He will be needing a functional study in about 3 months. His EF was normal by cath. Slightly lower by echo. He is not on ACE. His other problems include obesity, HTN, and HLD.  He comes in today. He is here with his daughter. He is hard of hearing. He says he is doing well. Walking some. May not be able to afford cardiac rehab. No chest pain. More concerned about his right arm. Has some swelling and bruising. His cath was attempted radially but not successful and had femoral access. He has some bruising in his groin but thinks that is ok. Did have some right sided belly pain yesterday but it is now resolved. No back pain. Not dizzy or lightheaded. Seems to be tolerating his medicines.   Current Outpatient Prescriptions on File Prior to Visit  Medication Sig Dispense Refill  . amLODipine (NORVASC) 5 MG tablet Take 5 mg by mouth daily.       Marland Kitchen aspirin 81 MG tablet Take 81 mg by mouth daily.      Marland Kitchen atorvastatin (LIPITOR) 80 MG tablet Take 1 tablet (80 mg total) by mouth at bedtime.  30 tablet  6  . clopidogrel (PLAVIX) 75 MG tablet Take 1 tablet (75 mg total) by mouth daily with breakfast.  30 tablet  6  . diphenoxylate-atropine (LOMOTIL) 2.5-0.025 MG per tablet Take 1 tablet by mouth 4 (four) times daily as needed.      . lansoprazole (PREVACID) 30 MG capsule Take 30 mg by mouth daily.      . metoprolol tartrate (LOPRESSOR) 25 MG tablet Take 12.5 mg by mouth 2 (two) times daily.       . nitroGLYCERIN (NITROSTAT) 0.4 MG SL tablet Place 1 tablet (0.4 mg total) under the tongue every 5 (five) minutes x 3 doses as needed for chest pain (up to 3 doses).  25 tablet  3     Allergies  Allergen Reactions  . Codeine     Past Medical History  Diagnosis Date  . GERD (gastroesophageal reflux disease)   . HTN (hypertension)   . Hx of adenomatous colonic polyps   . Kidney stones   . NSTEMI (non-ST elevated myocardial infarction) March 2013    DES to the RCA; has residual disease with a 70-80% 1st marginal and a 60-70% mid LCX. Functional study recommended in about 3 months to reassess  . Obesity   . HLD (hyperlipidemia)     Past Surgical History  Procedure Date  . Appendectomy   . Ruptured disc   . Esophagogastroduodenoscopy 2003    distal erosion/small hiatal hernia  . Colonoscopy 08/21/2004    Pedunculated polyp at the splenic flexure  . Colonoscopy 09/2009    Dr. Rourk--> Cecal and hepatic flexure polyps, tubular adenomas. Next colonoscopy October 2013  . Esophagogastroduodenoscopy 09/2009    Dr. Rinaldo Ratel ring, distal septal regions, status post [facility] dilation, small hiatal hernia.  . Back surgery     History  Smoking status  . Former Smoker  Smokeless tobacco  . Not on file  Comment: quit about 9 years ago    History  Alcohol Use No    Family History  Problem Relation Age of Onset  . Pneumonia Father     deceased age 80  . Heart disease Mother     deceased age 39  . Breast cancer Sister     2 sisters  . Esophageal cancer Brother     Review of Systems: The review of systems is positive for bruising and swelling of the right hand.  All other systems were reviewed and are negative.  Physical Exam: BP 128/72  Pulse 70  Ht 5\' 9"  (1.753 m)  Wt 234 lb (106.142 kg)  BMI 34.56 kg/m2 Patient is very pleasant and in no acute distress. He is obese. Skin is warm and dry. Color is normal.  HEENT is unremarkable. Normocephalic/atraumatic. PERRL. Sclera are nonicteric. Neck is supple. No masses. No JVD. Lungs are clear. Cardiac exam shows a regular rate and rhythm. Abdomen is soft. Extremities are without edema. His right arm  is bruised that extends into the hand. His pulses are 2+ in the right arm. His groin is ok with just very mild bruising. No swelling in the groin noted. Gait and ROM are intact. No gross neurologic deficits noted.   LABORATORY DATA: Labs are pending. His EKG today shows sinus rhythm with inferior T wave changes.   Assessment / Plan:

## 2012-03-11 NOTE — Assessment & Plan Note (Signed)
Patient has had recent NSTEMI with DES to the RCA. Has residual disease. Patient was subsequently seen with Dr. Swaziland. Will plan on a functional study in about 3 months. No change in his medicines at this time but may need to switch to ACE if EF is low per functional study. We will check CBC and BMET today. His arm is felt to be ok. Walking daily is encouraged. Cardiac rehab may be cost prohibitive. We will see him back in one month with fasting labs. Patient is agreeable to this plan and will call if any problems develop in the interim.

## 2012-03-11 NOTE — Assessment & Plan Note (Signed)
BP looks ok today. No change in his current regimen.

## 2012-03-13 ENCOUNTER — Encounter: Payer: Self-pay | Admitting: Gastroenterology

## 2012-03-13 NOTE — Progress Notes (Signed)
Tried to call pt x2, phone number busy both times.

## 2012-03-13 NOTE — Progress Notes (Unsigned)
Patient ID: Tyler Mejia, male   DOB: 16-Aug-1936, 76 y.o.   MRN: [id number]  Please call patient to see how he is doing? This is the one who had MI in office. Want to make sure no ongoing GI issues.  On exam that day, ?fullness in luq. Abd u/s showed normal spleen. I would recommend OV f/u in 6 weeks or so to tie up loose ends, repeat abdominal exam.

## 2012-03-13 NOTE — Progress Notes (Signed)
Spoke with pt- he said he is doing good and its ok to set up appt to come back in 6 weeks.   Darl Pikes, please schedule appt.

## 2012-03-18 ENCOUNTER — Encounter: Payer: Self-pay | Admitting: Gastroenterology

## 2012-03-18 NOTE — Discharge Summary (Signed)
Seen and agree.  DC planned today.

## 2012-03-18 NOTE — Progress Notes (Signed)
Pt is aware of OV on 5/22 at 0900 with [facility] and appt card was mailed

## 2012-03-25 ENCOUNTER — Telehealth: Payer: Self-pay | Admitting: Cardiology

## 2012-03-25 NOTE — Telephone Encounter (Signed)
PT IS HAVING A LOT OF SIDE EFFECTS FROM LIPITOR AND HE WANTS TO KNOW IF HE CAN TAKE ZETIA INSTEAD

## 2012-03-25 NOTE — Telephone Encounter (Signed)
Patient called, stated lipitor is causing muscle pain and not sleeping at night.Patient told will check with Dr.Jordan and call him back tomorrow.

## 2012-03-26 MED ORDER — ATORVASTATIN CALCIUM 20 MG PO TABS: 20.0000 mg | ORAL_TABLET | Freq: Every day | ORAL | Status: DC

## 2012-03-26 MED ORDER — EZETIMIBE 10 MG PO TABS: 10.0000 mg | ORAL_TABLET | Freq: Every day | ORAL | Status: DC

## 2012-03-26 NOTE — Telephone Encounter (Signed)
Patient called no answer.Left message on personal voice mail Dr.Jordan advises to decrease lipitor to 20 mg daily and start zetia 10 mg daily.Prescription sent to [facility].Advised to call back if continues to have problems.

## 2012-03-26 NOTE — Telephone Encounter (Signed)
Patient called no answer.LMTC. 

## 2012-03-30 ENCOUNTER — Telehealth: Payer: Self-pay | Admitting: Cardiology

## 2012-03-30 NOTE — Telephone Encounter (Signed)
PT RTN CALL FROM NURSE FROM LAST WEEK

## 2012-03-30 NOTE — Telephone Encounter (Signed)
He can stop taking lipitor for now. We'll see how he does on Zetia. There is only one dose 10 mg daily. Tyler Mejia Swaziland MD, [facility]

## 2012-03-30 NOTE — Telephone Encounter (Signed)
Pt was told by Dr Elvis Coil nurse to decrease his lipitor and start zetia.  He states he does not want to take any of the lipitor at all.  He wants to totally discontinue it due to the problems he was having on it.  He wants to know if he can take more of the zetia instead.  He states he does not want to be on two medications for it as he is already taking too many medications.

## 2012-03-30 NOTE — Telephone Encounter (Signed)
Patient called was told ok with Dr.Jordan to stop lipitor.Continue zetia as prescribed.

## 2012-03-30 NOTE — Telephone Encounter (Signed)
Patient called phone rings busy. 

## 2012-04-10 ENCOUNTER — Other Ambulatory Visit: Payer: Medicare Other

## 2012-04-10 ENCOUNTER — Ambulatory Visit (INDEPENDENT_AMBULATORY_CARE_PROVIDER_SITE_OTHER): Payer: Medicare Other | Admitting: Nurse Practitioner

## 2012-04-10 ENCOUNTER — Encounter: Payer: Self-pay | Admitting: Nurse Practitioner

## 2012-04-10 VITALS — BP 130/78 | HR 67 | Ht 69.5 in | Wt 239.0 lb

## 2012-04-10 DIAGNOSIS — I10 Essential (primary) hypertension: Secondary | ICD-10-CM

## 2012-04-10 DIAGNOSIS — E785 Hyperlipidemia, unspecified: Secondary | ICD-10-CM | POA: Insufficient documentation

## 2012-04-10 DIAGNOSIS — I2510 Atherosclerotic heart disease of native coronary artery without angina pectoris: Secondary | ICD-10-CM

## 2012-04-10 DIAGNOSIS — I214 Non-ST elevation (NSTEMI) myocardial infarction: Secondary | ICD-10-CM

## 2012-04-10 MED ORDER — PRAVASTATIN SODIUM 20 MG PO TABS: 20.0000 mg | ORAL_TABLET | Freq: Every evening | ORAL | Status: DC

## 2012-04-10 NOTE — Assessment & Plan Note (Signed)
Blood pressure is ok. No change in his medicines at this time.

## 2012-04-10 NOTE — Assessment & Plan Note (Signed)
Patient has had prior NSTEMI with DES to the RCA. Remains on Plavix. Has residual disease. Still feels a little weak. No exertional angina reported. Some atypical symptoms. Have asked him to take his PPI daily. Will plan for Myoview testing in 4 weeks. Will plan on switching to ACE if EF is low per functional study. He is encouraged to keep walking. We will see him back in 5 weeks. Patient is agreeable to this plan and will call if any problems develop in the interim.

## 2012-04-10 NOTE — Assessment & Plan Note (Signed)
He does not want to take Lipitor. Cannot afford Zetia (80 dollars a month). He is agreeable to low dose Pravachol at 20 mg. Will plan on rechecking his labs in 4 weeks at the time of his stress test.

## 2012-04-10 NOTE — Patient Instructions (Signed)
Don't take the Zetia since it costs too much  Let's try Pravachol 20 mg each evening  We are going to get your stress test here in 4 weeks and have repeat labs at that time (BMET, HPF, Lipids)  Stay active and try to walk daily  We will see you in 5 weeks.  Call the [facility] office at [phone] if you have any questions, problems or concerns.

## 2012-04-10 NOTE — Progress Notes (Signed)
Tyler Mejia Date of Birth: 09-20-1936 Medical Record #[id number]  History of Present Illness: Tyler Mejia is seen back today for a one month check. He is seen for Tyler Mejia. He had a NSTEMI with DES to the RCA. He is committed to Plavix for one year. He does have residual disease in a marginal branch and in the LCX. He will need a functional study in about 6 to 8 weeks to reassess per Tyler Mejia. His EF was normal by cath. Slightly lower by echo. He is not on ACE. Other issues include obesity, HTN and HLD.  He comes in today. He is here alone. He says he is doing well and has had a good month. He is trying to walk. Does tire easily. He is limited by his back and legs hurting at times. No exertional chest pain. No chest pain that is similar to his heart attack symptoms. Does have some intermittent upper left chest pain that he thinks is indigestion. No NTG use. Only taking his PPI if needed. He had his lipids checked last month. He did not want to be on Lipitor. Says it makes his hearing worse and too achy in his joints. He cannot afford the Zetia. He has never tried any other statin therapy but is agreeable.   Current Outpatient Prescriptions on File Prior to Visit  Medication Sig Dispense Refill  . amLODipine (NORVASC) 5 MG tablet Take 5 mg by mouth daily.       Marland Kitchen aspirin 81 MG tablet Take 81 mg by mouth daily.      . clopidogrel (PLAVIX) 75 MG tablet Take 1 tablet (75 mg total) by mouth daily with breakfast.  30 tablet  6  . diphenoxylate-atropine (LOMOTIL) 2.5-0.025 MG per tablet Take 1 tablet by mouth 4 (four) times daily as needed.      . lansoprazole (PREVACID) 30 MG capsule Take 30 mg by mouth daily.      . metoprolol tartrate (LOPRESSOR) 25 MG tablet Take 12.5 mg by mouth 2 (two) times daily.       . nitroGLYCERIN (NITROSTAT) 0.4 MG SL tablet Place 1 tablet (0.4 mg total) under the tongue every 5 (five) minutes x 3 doses as needed for chest pain (up to 3 doses).  25 tablet  3  .  pravastatin (PRAVACHOL) 20 MG tablet Take 1 tablet (20 mg total) by mouth every evening.  30 tablet  11    Allergies  Allergen Reactions  . Codeine     Past Medical History  Diagnosis Date  . GERD (gastroesophageal reflux disease)   . HTN (hypertension)   . Hx of adenomatous colonic polyps   . Kidney stones   . NSTEMI (non-ST elevated myocardial infarction) March 2013    DES to the RCA; has residual disease with a 70-80% 1st marginal and a 60-70% mid LCX. Functional study recommended in about 3 months to reassess  . Obesity   . HLD (hyperlipidemia)     Past Surgical History  Procedure Date  . Appendectomy   . Ruptured disc   . Esophagogastroduodenoscopy 2003    distal erosion/small hiatal hernia  . Colonoscopy 08/21/2004    Pedunculated polyp at the splenic flexure  . Colonoscopy 09/2009    Tyler Mejia--> Cecal and hepatic flexure polyps, tubular adenomas. Next colonoscopy October 2013  . Esophagogastroduodenoscopy 09/2009    Tyler Mejia ring, distal septal regions, status post Tyler Mejia dilation, small hiatal hernia.  . Back surgery     History  Smoking status  . Former Smoker  Smokeless tobacco  . Not on file  Comment: quit about 9 years ago    History  Alcohol Use No    Family History  Problem Relation Age of Onset  . Pneumonia Father     deceased age 64  . Heart disease Mother     deceased age 21  . Breast cancer Sister     2 sisters  . Esophageal cancer Brother     Review of Systems: The review of systems is per the HPI.  All other systems were reviewed and are negative.  Physical Exam: BP 130/78  Pulse 67  Ht 5' 9.5" (1.765 m)  Wt 239 lb (108.41 kg)  BMI 34.79 kg/m2 Patient is very pleasant and in no acute distress. Does seem a little anxious. Skin is warm and dry. Color is normal.  HEENT is unremarkable. Normocephalic/atraumatic. PERRL. Sclera are nonicteric. Neck is supple. No masses. No JVD. Lungs are clear. Cardiac exam shows a regular  rate and rhythm. Abdomen is obese but soft. Extremities are without edema. Gait and ROM are intact. No gross neurologic deficits noted.  LABORATORY DATA:   Lab Results  Component Value Date   WBC 9.3 03/11/2012   HGB 14.1 03/11/2012   HCT 42.0 03/11/2012   PLT 279.0 03/11/2012   GLUCOSE 186* 03/11/2012   CHOL 124 02/29/2012   TRIG 87 02/29/2012   HDL 32* 02/29/2012   LDLCALC 75 02/29/2012   NA 138 03/11/2012   K 4.3 03/11/2012   CL 101 03/11/2012   CREATININE 0.9 03/11/2012   BUN 25* 03/11/2012   CO2 27 03/11/2012   INR 1.10 02/28/2012    Assessment / Plan:

## 2012-04-29 ENCOUNTER — Ambulatory Visit: Payer: Medicare Other | Admitting: Gastroenterology

## 2012-05-13 ENCOUNTER — Ambulatory Visit (HOSPITAL_COMMUNITY): Payer: Medicare Other | Attending: Cardiovascular Disease | Admitting: Radiology

## 2012-05-13 ENCOUNTER — Ambulatory Visit (INDEPENDENT_AMBULATORY_CARE_PROVIDER_SITE_OTHER): Payer: Medicare Other | Admitting: *Deleted

## 2012-05-13 VITALS — BP 121/71 | Ht 69.0 in | Wt 238.0 lb

## 2012-05-13 DIAGNOSIS — Z87891 Personal history of nicotine dependence: Secondary | ICD-10-CM | POA: Insufficient documentation

## 2012-05-13 DIAGNOSIS — R5383 Other fatigue: Secondary | ICD-10-CM | POA: Insufficient documentation

## 2012-05-13 DIAGNOSIS — R079 Chest pain, unspecified: Secondary | ICD-10-CM | POA: Insufficient documentation

## 2012-05-13 DIAGNOSIS — I252 Old myocardial infarction: Secondary | ICD-10-CM | POA: Insufficient documentation

## 2012-05-13 DIAGNOSIS — Z9861 Coronary angioplasty status: Secondary | ICD-10-CM | POA: Insufficient documentation

## 2012-05-13 DIAGNOSIS — E785 Hyperlipidemia, unspecified: Secondary | ICD-10-CM | POA: Insufficient documentation

## 2012-05-13 DIAGNOSIS — R0989 Other specified symptoms and signs involving the circulatory and respiratory systems: Secondary | ICD-10-CM | POA: Insufficient documentation

## 2012-05-13 DIAGNOSIS — I2510 Atherosclerotic heart disease of native coronary artery without angina pectoris: Secondary | ICD-10-CM

## 2012-05-13 DIAGNOSIS — R0602 Shortness of breath: Secondary | ICD-10-CM

## 2012-05-13 DIAGNOSIS — R0609 Other forms of dyspnea: Secondary | ICD-10-CM | POA: Insufficient documentation

## 2012-05-13 DIAGNOSIS — I10 Essential (primary) hypertension: Secondary | ICD-10-CM

## 2012-05-13 DIAGNOSIS — R5381 Other malaise: Secondary | ICD-10-CM | POA: Insufficient documentation

## 2012-05-13 LAB — BASIC METABOLIC PANEL
BUN: 19 mg/dL (ref 6–23)
CO2: 27 mEq/L (ref 19–32)
Glucose, Bld: 95 mg/dL (ref 70–99)
Potassium: 4.4 mEq/L (ref 3.5–5.1)
Sodium: 142 mEq/L (ref 135–145)

## 2012-05-13 LAB — HEPATIC FUNCTION PANEL
AST: 26 U/L (ref 0–37)
Albumin: 3.9 g/dL (ref 3.5–5.2)
Alkaline Phosphatase: 65 U/L (ref 39–117)
Total Protein: 6.8 g/dL (ref 6.0–8.3)

## 2012-05-13 LAB — LIPID PANEL: Cholesterol: 101 mg/dL (ref 0–200)

## 2012-05-13 MED ORDER — TECHNETIUM TC 99M TETROFOSMIN IV KIT
11.0000 | PACK | Freq: Once | INTRAVENOUS | Status: AC | PRN
  Administered 2012-05-13: 11 via INTRAVENOUS

## 2012-05-13 MED ORDER — TECHNETIUM TC 99M TETROFOSMIN IV KIT
33.0000 | PACK | Freq: Once | INTRAVENOUS | Status: AC | PRN
  Administered 2012-05-13: 33 via INTRAVENOUS

## 2012-05-13 MED ORDER — REGADENOSON 0.4 MG/5ML IV SOLN
0.4000 mg | Freq: Once | INTRAVENOUS | Status: AC
  Administered 2012-05-13: 0.4 mg via INTRAVENOUS

## 2012-05-13 NOTE — Progress Notes (Signed)
[facility] SITE 3 NUCLEAR MED [address] [place] [address] [phone]  Cardiology Nuclear Med Study  Tyler Mejia is a 76 y.o. male     MRN : [id number]     DOB: 1936/06/25  Procedure Date: 05/13/2012  Nuclear Med Background Indication for Stress Test:  Evaluation for Ischemia and Stent Patency History:  02/2012: NSTEMI-Heart Cath-EF: 55-65% LCFX 70-80% and other N/O CAD RCA to stent-Angioplasty/Stent RCA  -ECHO: 45-50% Akinesis of basal mid inferior wall Cardiac Risk Factors: History of Smoking and Lipids  Symptoms:  Chest Pain, DOE, Fatigue and Fatigue with Exertion   Nuclear Pre-Procedure Caffeine/Decaff Intake:  None> 12 hrs NPO After: 8:00pm   Lungs:  clear O2 Sat: 94% on room air. IV 0.9% NS with Angio Cath:  22g  IV Site: R Forearm x 1, tolerated well IV Started by:  Irean Hong, RN  Chest Size (in):  48 Cup Size: n/a  Height: 5\' 9"  (1.753 m)  Weight:  238 lb (107.956 kg)  BMI:  Body mass index is 35.15 kg/(m^2). Tech Comments:  Took lopressor this am    Nuclear Med Study 1 or 2 day study: 1 day  Stress Test Type:  Lexiscan  Reading MD: Kristeen Miss, MD  Order Authorizing Provider:  Peter Swaziland, MD  Resting Radionuclide: Technetium 51m Tetrofosmin  Resting Radionuclide Dose: 11.0 mCi   Stress Radionuclide:  Technetium 55m Tetrofosmin  Stress Radionuclide Dose: 33.0 mCi           Stress Protocol Rest HR: 57 Stress HR: 74  Rest BP: 121/71 Stress BP: 128/78  Exercise Time (min): n/a METS: n/a   Predicted Max HR: 147 bpm % Max HR: 50.34 bpm Rate Pressure Product: 9472   Dose of Adenosine (mg):  n/a Dose of Lexiscan: 0.4 mg  Dose of Atropine (mg): n/a Dose of Dobutamine: n/a mcg/kg/min (at max HR)  Stress Test Technologist: Milana Na, EMT-P  Nuclear Technologist:  Domenic Polite, CNMT     Rest Procedure:  Myocardial perfusion imaging was performed at rest 45 minutes following the intravenous administration of Technetium 32m  Tetrofosmin. Rest ECG: Sinus Bradycardia  Stress Procedure:  The patient received IV Lexiscan 0.4 mg over 15-seconds.  Technetium 51m Tetrofosmin injected at 30-seconds.  There were no significant changes with Lexiscan.  Quantitative spect images were obtained after a 45 minute delay. Stress ECG: No significant change from baseline ECG  QPS Raw Data Images:  Normal; no motion artifact; normal heart/lung ratio. Stress Images:  There is mild apical thinning with normal uptake in other regions. Rest Images:  There is mild apical thinning with normal uptake in other regions. Subtraction (SDS):  No evidence of ischemia.  The apex contracts normally.  There is no evidence of a previous apical scar. Transient Ischemic Dilatation (Normal <1.22):  1.15 Lung/Heart Ratio (Normal <0.45):  0.44  Quantitative Gated Spect Images QGS EDV:  95 ml QGS ESV:  35 ml  Impression Exercise Capacity:  Lexiscan with no exercise. BP Response:  Normal blood pressure response. Clinical Symptoms:  No symptoms. ECG Impression:  No significant ST segment change suggestive of ischemia. Comparison with Prior Nuclear Study: No images to compare  Overall Impression:  Normal stress nuclear study.  No evidence of ischemia.  The LV function is normal - including the apex.   LV Ejection Fraction: 63%.  LV Wall Motion:  NL LV Function; NL Wall Motion.    Vesta Mixer, Montez Hageman., MD, [facility] 05/13/2012, 4:55 PM  Office - [phone] Pager [phone]

## 2012-05-14 ENCOUNTER — Encounter: Payer: Self-pay | Admitting: *Deleted

## 2012-05-20 ENCOUNTER — Ambulatory Visit (INDEPENDENT_AMBULATORY_CARE_PROVIDER_SITE_OTHER): Payer: Medicare Other | Admitting: Cardiology

## 2012-05-20 ENCOUNTER — Encounter: Payer: Self-pay | Admitting: Cardiology

## 2012-05-20 ENCOUNTER — Other Ambulatory Visit: Payer: Medicare Other

## 2012-05-20 VITALS — BP 138/70 | HR 68 | Ht 69.0 in | Wt 239.0 lb

## 2012-05-20 DIAGNOSIS — R0989 Other specified symptoms and signs involving the circulatory and respiratory systems: Secondary | ICD-10-CM

## 2012-05-20 DIAGNOSIS — I10 Essential (primary) hypertension: Secondary | ICD-10-CM

## 2012-05-20 DIAGNOSIS — R0609 Other forms of dyspnea: Secondary | ICD-10-CM

## 2012-05-20 DIAGNOSIS — E785 Hyperlipidemia, unspecified: Secondary | ICD-10-CM

## 2012-05-20 DIAGNOSIS — I2510 Atherosclerotic heart disease of native coronary artery without angina pectoris: Secondary | ICD-10-CM | POA: Insufficient documentation

## 2012-05-20 DIAGNOSIS — R0600 Dyspnea, unspecified: Secondary | ICD-10-CM

## 2012-05-20 DIAGNOSIS — I259 Chronic ischemic heart disease, unspecified: Secondary | ICD-10-CM

## 2012-05-20 MED ORDER — PRAVASTATIN SODIUM 10 MG PO TABS: 10.0000 mg | ORAL_TABLET | Freq: Every evening | ORAL | Status: DC

## 2012-05-20 NOTE — Assessment & Plan Note (Addendum)
He is status post stenting of the right coronary with a DES in March of this year. Recent Myoview study because of his symptoms was normal. I do not feel that his chest discomfort or dyspnea are cardiac related. We will evaluate other possibilities. We will obtain pulmonary function studies. If these are unremarkable I would consider a sleep study to rule out sleep apnea. He does have a long history of esophageal problems with previous dilatations. He is on reflux therapy chronically. He is encouraged to increase his aerobic activity and lose weight.

## 2012-05-20 NOTE — Assessment & Plan Note (Signed)
His labs showed excellent control of his lipids. He is complaining of some muscle fatigue. We will reduce his Pravachol to 10 mg daily.

## 2012-05-20 NOTE — Patient Instructions (Addendum)
Your physician has recommended you make the following change in your medication:  Reduce pravachol to 10 mg daily.  Your physician has recommended that you have a pulmonary function test. Pulmonary Function Tests are a group of tests that measure how well air moves in and out of your lungs. We will schedule you for pulmonary function tests.  Try and get more exercise.  Your physician recommends that you schedule a follow-up appointment in: 3-4 months

## 2012-05-20 NOTE — Progress Notes (Signed)
Tyler Mejia Date of Birth: November 19, 1936 Medical Record #[id number]  History of Present Illness: Tyler Mejia is seen back today for a followup visit. He had a NSTEMI in March of this year with DES to the RCA. He is committed to Plavix for one year. He does have moderate residual disease in a marginal branch and in the LCX. On followup today his major complaint is that he is short of breath. He thinks this is worse than it was before. He has always had some chronic shortness of breath. He has a vague sensation of discomfort in his left chest that is also been present for years and is different than his anginal pain. He has had a recent sinus infection. He does complain of a lot of fatigue during the day. He states he does not sleep well at night. He lives alone so there is no known snoring.  Current Outpatient Prescriptions on File Prior to Visit  Medication Sig Dispense Refill  . amLODipine (NORVASC) 5 MG tablet Take 5 mg by mouth daily.       Marland Kitchen aspirin 81 MG tablet Take 81 mg by mouth daily.      . clopidogrel (PLAVIX) 75 MG tablet Take 1 tablet (75 mg total) by mouth daily with breakfast.  30 tablet  6  . diphenoxylate-atropine (LOMOTIL) 2.5-0.025 MG per tablet Take 1 tablet by mouth 4 (four) times daily as needed.      . lansoprazole (PREVACID) 30 MG capsule Take 30 mg by mouth daily.       . metoprolol tartrate (LOPRESSOR) 25 MG tablet Take 12.5 mg by mouth 2 (two) times daily.       . nitroGLYCERIN (NITROSTAT) 0.4 MG SL tablet Place 1 tablet (0.4 mg total) under the tongue every 5 (five) minutes x 3 doses as needed for chest pain (up to 3 doses).  25 tablet  3  . DISCONTD: pravastatin (PRAVACHOL) 20 MG tablet Take 1 tablet (20 mg total) by mouth every evening.  30 tablet  11    Allergies  Allergen Reactions  . Codeine     Past Medical History  Diagnosis Date  . GERD (gastroesophageal reflux disease)   . HTN (hypertension)   . Hx of adenomatous colonic polyps   . Kidney stones   .  NSTEMI (non-ST elevated myocardial infarction) March 2013    DES to the RCA; has residual disease with a 70-80% 1st marginal and a 60-70% mid LCX. Functional study recommended in about 3 months to reassess  . Obesity   . HLD (hyperlipidemia)     Past Surgical History  Procedure Date  . Appendectomy   . Ruptured disc   . Esophagogastroduodenoscopy 2003    distal erosion/small hiatal hernia  . Colonoscopy 08/21/2004    Pedunculated polyp at the splenic flexure  . Colonoscopy 09/2009    Dr. Rourk--> Cecal and hepatic flexure polyps, tubular adenomas. Next colonoscopy October 2013  . Esophagogastroduodenoscopy 09/2009    Dr. Rinaldo Ratel ring, distal septal regions, status post [facility] dilation, small hiatal hernia.  . Back surgery     History  Smoking status  . Former Smoker  Smokeless tobacco  . Not on file  Comment: quit about 9 years ago    History  Alcohol Use No    Family History  Problem Relation Age of Onset  . Pneumonia Father     deceased age 89  . Heart disease Mother     deceased age 1  . Breast  cancer Sister     2 sisters  . Esophageal cancer Brother     Review of Systems: The review of systems is per the HPI.  All other systems were reviewed and are negative.  Physical Exam: BP 138/70  Pulse 68  Ht 5\' 9"  (1.753 m)  Wt 239 lb (108.41 kg)  BMI 35.29 kg/m2 Patient is an obese, pleasant white male in no acute distress.  Skin is warm and dry. Color is normal.  HEENT is unremarkable. Normocephalic/atraumatic. PERRL. Sclera are nonicteric. Neck is supple. No masses. No JVD. Lungs are clear. Cardiac exam shows a regular rate and rhythm. Abdomen is obese but soft. Extremities are without edema. Gait and ROM are intact. No gross neurologic deficits noted.  LABORATORY DATA:  Recent stress Myoview was normal showing no evidence of ischemia and an ejection fraction of 63%. Specifically there was no evidence of inferior infarction. He had no ischemia in the  circumflex distribution.   Assessment / Plan:

## 2012-05-20 NOTE — Assessment & Plan Note (Signed)
Blood pressure is good on his current regimen.

## 2012-06-02 ENCOUNTER — Other Ambulatory Visit: Payer: Self-pay

## 2012-06-02 DIAGNOSIS — R0602 Shortness of breath: Secondary | ICD-10-CM

## 2012-06-03 ENCOUNTER — Other Ambulatory Visit: Payer: Self-pay

## 2012-06-09 ENCOUNTER — Ambulatory Visit (INDEPENDENT_AMBULATORY_CARE_PROVIDER_SITE_OTHER): Payer: Medicare Other | Admitting: Internal Medicine

## 2012-06-09 DIAGNOSIS — R0602 Shortness of breath: Secondary | ICD-10-CM

## 2012-06-09 LAB — PULMONARY FUNCTION TEST

## 2012-06-09 NOTE — Progress Notes (Signed)
PFT done today. 

## 2012-06-12 ENCOUNTER — Encounter (HOSPITAL_COMMUNITY): Payer: Medicare Other

## 2012-06-17 ENCOUNTER — Telehealth: Payer: Self-pay | Admitting: Cardiology

## 2012-06-17 NOTE — Telephone Encounter (Signed)
Patient called was told PFT's ok. Dr.Jordan recommends a sleep study.Patient stated he did not want to have a sleep study but if Dr.Jordan really thinks it necessary he will have done.Wants to be done at [facility].Schedulers will call to schedule.Also wants Dr.Jordan to know cannot take pravastatin causes legs to cramp.Will let Dr.Jordan know and call him back.

## 2012-06-17 NOTE — Telephone Encounter (Signed)
Patient returning nurse call, he can be reached at [phone]

## 2012-06-18 NOTE — Telephone Encounter (Signed)
Patient called was told spoke to Dr.Jordan and told him unable to take pravastatin due to legs cramping.Advised to follow diet.Also will be getting a call from our office to schedule sleep study.

## 2012-06-18 NOTE — Telephone Encounter (Signed)
Pt rtn call-pls call  °

## 2012-06-25 ENCOUNTER — Telehealth: Payer: Self-pay | Admitting: Cardiology

## 2012-06-25 NOTE — Telephone Encounter (Signed)
Pt calling re sleep study, pt has questions

## 2012-06-25 NOTE — Telephone Encounter (Signed)
Pt said he will have to pay 20% of cost for sleep study, he thought you were going to be able to get this fee removed? He is sounding like he wants to decline having test done stating his fatigue may be from frequent urination or from cholesterol med that was stopped. Told pt I would forward msg to nurse and a decision can be discussed then, pt agreeable to plan.

## 2012-06-26 NOTE — Telephone Encounter (Signed)
Patient called stated he did not want to have sleep study done,will let Dr.Jordan know.

## 2012-08-20 ENCOUNTER — Encounter: Payer: Self-pay | Admitting: Pulmonary Disease

## 2012-08-21 ENCOUNTER — Ambulatory Visit (INDEPENDENT_AMBULATORY_CARE_PROVIDER_SITE_OTHER): Payer: Medicare Other | Admitting: Pulmonary Disease

## 2012-08-21 ENCOUNTER — Encounter: Payer: Self-pay | Admitting: Pulmonary Disease

## 2012-08-21 VITALS — BP 122/80 | HR 62 | Temp 98.5°F | Ht 69.0 in | Wt 244.6 lb

## 2012-08-21 DIAGNOSIS — J449 Chronic obstructive pulmonary disease, unspecified: Secondary | ICD-10-CM

## 2012-08-21 MED ORDER — TIOTROPIUM BROMIDE MONOHYDRATE 18 MCG IN CAPS: 18.0000 ug | ORAL_CAPSULE | Freq: Every day | RESPIRATORY_TRACT | Status: DC

## 2012-08-21 NOTE — Progress Notes (Signed)
  Subjective:    Patient ID: Tyler Mejia, male    DOB: Aug 04, 1936, 76 y.o.   MRN: [id number]  HPI The patient is a 76 year old male who I have been asked to see for management of COPD.  The patient has had dyspnea on exertion for greater than 5 years, but a family member believes that it has been getting worse.  He describes a 2 block dyspnea on exertion at a moderate pace on flat ground, and will get winded walking up one flight of stairs.  He denies shortness of breath bringing groceries in from the car.  He also denies any cough or mucus production since he has quit smoking, and only has mild lower extremity edema.  It should be noted that he had a myocardial infarction in March of this year, and a chest x-ray at that time showed cardiomegaly with mild vascular congestion.  He has had recent pulmonary function studies in July, and these showed mild airflow obstruction, and no other abnormal finding.  The patient currently is not on any bronchodilator.   Review of Systems  Constitutional: Negative for fever and unexpected weight change.  HENT: Positive for congestion and trouble swallowing. Negative for ear pain, nosebleeds, sore throat, rhinorrhea, sneezing, dental problem, postnasal drip and sinus pressure.   Eyes: Negative for redness and itching.  Respiratory: Positive for cough and shortness of breath. Negative for chest tightness and wheezing.   Cardiovascular: Positive for chest pain. Negative for palpitations and leg swelling.  Gastrointestinal: Negative for nausea and vomiting.  Genitourinary: Positive for dysuria.  Musculoskeletal: Negative for joint swelling.  Skin: Negative for rash.  Neurological: Negative for headaches.  Hematological: Does not bruise/bleed easily.  Psychiatric/Behavioral: Negative for dysphoric mood. The patient is not nervous/anxious.        Objective:   Physical Exam Constitutional:  Overweight male, no acute distress  HENT:  Nares patent without  discharge  Oropharynx without exudate, palate and uvula are normal  Eyes:  Perrla, eomi, no scleral icterus  Neck:  No JVD, no TMG  Cardiovascular:  Normal rate, regular rhythm, no rubs or gallops.  No murmurs        Intact distal pulses  Pulmonary :  Normal breath sounds, no stridor or respiratory distress   No rales, rhonchi, or wheezing  Abdominal:  Soft, nondistended, bowel sounds present.  No tenderness noted.   Musculoskeletal:  mild lower extremity edema noted.  Lymph Nodes:  No cervical lymphadenopathy noted  Skin:  No cyanosis noted  Neurologic:  Alert, appropriate, moves all 4 extremities without obvious deficit.         Assessment & Plan:

## 2012-08-21 NOTE — Assessment & Plan Note (Signed)
The patient has mild emphysema by his recent pulmonary function studies, but his degree of dyspnea on exertion is out of proportion to this.  I suspect his shortness of breath is multifactorial, and related to his lung disease, heart disease, as well as weight and conditioning.  Would like to start him on a simple bronchodilator maintenance regimen, and see how he responds.  I also stressed to him the importance of weight loss and conditioning.

## 2012-08-21 NOTE — Patient Instructions (Addendum)
You have mild emphysema by your breathing studies, but this is only a part of your shortness of breath.  Will start on spiriva one inhalation each am everyday.  Rinse mouth well after using. Can use albuterol inhaler  2 puffs up to every 6hrs if needed for emergencies. You really need to work hard on weight loss and a conditioning program.  This will really help your breathing and functional status. followup with me in 8 weeks.

## 2012-08-27 ENCOUNTER — Encounter: Payer: Self-pay | Admitting: *Deleted

## 2012-09-11 ENCOUNTER — Telehealth: Payer: Self-pay | Admitting: Pulmonary Disease

## 2012-09-11 NOTE — Telephone Encounter (Signed)
ATC line busy x 3 wcb. RX sent 08/21/12 x 6 refills.

## 2012-09-14 NOTE — Telephone Encounter (Signed)
Pt informed that 6 months of refills of Spiriva were given at last ov.  Pt verified understanding and will check with pharmacy.

## 2012-09-14 NOTE — Telephone Encounter (Signed)
[id number] x1 

## 2012-09-24 ENCOUNTER — Other Ambulatory Visit: Payer: Self-pay | Admitting: Cardiology

## 2012-10-16 ENCOUNTER — Ambulatory Visit: Payer: Medicare Other | Admitting: Pulmonary Disease

## 2012-10-16 ENCOUNTER — Ambulatory Visit (INDEPENDENT_AMBULATORY_CARE_PROVIDER_SITE_OTHER): Payer: Medicare Other | Admitting: Cardiology

## 2012-10-16 ENCOUNTER — Encounter: Payer: Self-pay | Admitting: Cardiology

## 2012-10-16 VITALS — BP 140/82 | HR 66 | Ht 69.0 in | Wt 246.0 lb

## 2012-10-16 DIAGNOSIS — K219 Gastro-esophageal reflux disease without esophagitis: Secondary | ICD-10-CM

## 2012-10-16 DIAGNOSIS — R1310 Dysphagia, unspecified: Secondary | ICD-10-CM

## 2012-10-16 DIAGNOSIS — I2510 Atherosclerotic heart disease of native coronary artery without angina pectoris: Secondary | ICD-10-CM

## 2012-10-16 DIAGNOSIS — E785 Hyperlipidemia, unspecified: Secondary | ICD-10-CM

## 2012-10-16 DIAGNOSIS — I10 Essential (primary) hypertension: Secondary | ICD-10-CM

## 2012-10-16 DIAGNOSIS — I259 Chronic ischemic heart disease, unspecified: Secondary | ICD-10-CM

## 2012-10-16 NOTE — Patient Instructions (Signed)
Continue your current medications  I would go see Dr. Channing Mutters about your continued indigestion.  I will see you back in 6 months.

## 2012-10-16 NOTE — Progress Notes (Signed)
Tyler Mejia Date of Birth: [birth year] Medical Record #[id number]  History of Present Illness: Tyler Mejia is seen back today for a followup visit. He had a NSTEMI in March of this year with DES to the RCA. He is committed to Plavix for one year. He does have moderate residual disease in a marginal branch and in the LCX. He complains of refractory symptoms of shortness of breath on exertion. He has also had persistent indigestion symptoms. He states that these symptoms predated his heart attack. He was evaluated by Tyler Mejia in pulmonary and PFTs showed mild obstructive defect. He was placed on an inhaler without significant improvement. He has been on Protonix but still has significant indigestion symptoms. This is worse when he eats spicy or greasy foods. He denies any significant cough. Because of these recurrent symptoms he did undergo a stress Myoview study in June which showed no evidence of ischemia and normal ejection fraction. He has a difficult time losing weight. He states he lives alone and eats a lot of junk food. He is concerned because his brother has been diagnosed with esophageal cancer.   Current Outpatient Prescriptions on File Prior to Visit  Medication Sig Dispense Refill  . amLODipine (NORVASC) 5 MG tablet Take 5 mg by mouth daily.       Marland Kitchen aspirin 81 MG tablet Take 81 mg by mouth daily.      . diphenoxylate-atropine (LOMOTIL) 2.5-0.025 MG per tablet Take 1 tablet by mouth 4 (four) times daily as needed.      . metoprolol tartrate (LOPRESSOR) 25 MG tablet Take 12.5 mg by mouth 2 (two) times daily.       . nitroGLYCERIN (NITROSTAT) 0.4 MG SL tablet Place 1 tablet (0.4 mg total) under the tongue every 5 (five) minutes x 3 doses as needed for chest pain (up to 3 doses).  25 tablet  3  . PANTOPRAZOLE SODIUM PO Take by mouth.      Marland Kitchen PLAVIX 75 MG tablet TAKE 1 TABLET DAILY WITH BREAKFAST.  30 tablet  6  . tiotropium (SPIRIVA) 18 MCG inhalation capsule Place 1 capsule (18 mcg total)  into inhaler and inhale daily.  30 capsule  6    Allergies  Allergen Reactions  . Codeine     Past Medical History  Diagnosis Date  . GERD (gastroesophageal reflux disease)   . HTN (hypertension)   . Hx of adenomatous colonic polyps   . Kidney stones   . NSTEMI (non-ST elevated myocardial infarction) March 2013    DES to the RCA; has residual disease with a 70-80% 1st marginal and a 60-70% mid LCX. Functional study recommended in about 3 months to reassess  . Obesity   . HLD (hyperlipidemia)   . COPD (chronic obstructive pulmonary disease)   . CAD (coronary artery disease)   . IBS (irritable bowel syndrome)     Past Surgical History  Procedure Date  . Appendectomy   . Ruptured disc   . Esophagogastroduodenoscopy 2003    distal erosion/small hiatal hernia  . Colonoscopy 08/21/2004    Pedunculated polyp at the splenic flexure  . Colonoscopy 09/2009    Tyler Mejia--> Cecal and hepatic flexure polyps, tubular adenomas. Next colonoscopy October 2013  . Esophagogastroduodenoscopy 09/2009    Tyler Mejia ring, distal septal regions, status post Tyler Mejia dilation, small hiatal hernia.  . Back surgery     History  Smoking status  . Former Smoker -- 1.5 packs/day for 45 years  . Types:  Cigarettes  . Quit date: 12/09/2001  Smokeless tobacco  . Never Used    Comment: quit about 9 years ago    History  Alcohol Use  . Yes    Comment: 5 drinks per month    Family History  Problem Relation Age of Onset  . Pneumonia Father     deceased age 14  . Heart disease Mother     deceased age 59  . Breast cancer Sister     2 sisters  . Esophageal cancer Brother   . Diabetes Mother     Review of Systems: The review of systems is per the HPI.  All other systems were reviewed and are negative.  Physical Exam: BP 140/82  Pulse 66  Ht 5\' 9"  (1.753 m)  Wt 246 lb (111.585 kg)  BMI 36.33 kg/m2  SpO2 93% Patient is an obese, pleasant white male in no acute distress.  Skin  is warm and dry. Color is normal.  HEENT is unremarkable. Normocephalic/atraumatic. PERRL. Sclera are nonicteric. Neck is supple. No masses. No JVD. Lungs are clear. Cardiac exam shows a regular rate and rhythm. Abdomen is obese but soft. Extremities are without edema. Gait and ROM are intact. No gross neurologic deficits noted.  LABORATORY DATA:  ECG today is normal.   Assessment / Plan: 1. Coronary disease status post non-ST elevation myocardial infarction March of 2013. He had stenting of the right coronary with a long drug-eluting stent. He had moderate disease noted in the obtuse marginal vessels. Followup Myoview study was negative in June. We will continue dual antiplatelet therapy. Continue amlodipine and metoprolol.  2. Dyspnea. Multifactorial. I think a lot of this is related to his morbid obesity and poor conditioning. He has not improved with inhaler therapy. Recommend regular exercise and weight loss.  3. GERD with increased indigestion symptoms. I recommended followup with Tyler Mejia concerning these refractory symptoms. He may need upper endoscopy.  4. Hypertension, controlled.  5. Morbid obesity.  6. Hyperlipidemia on Pravachol.

## 2012-10-28 NOTE — Progress Notes (Signed)
Pt is aware of OV for 11/21 at 11 with LSL

## 2012-10-29 ENCOUNTER — Encounter: Payer: Self-pay | Admitting: Gastroenterology

## 2012-10-29 ENCOUNTER — Ambulatory Visit (INDEPENDENT_AMBULATORY_CARE_PROVIDER_SITE_OTHER): Payer: Medicare Other | Admitting: Gastroenterology

## 2012-10-29 VITALS — BP 139/80 | HR 65 | Temp 97.6°F | Ht 69.0 in | Wt 247.8 lb

## 2012-10-29 DIAGNOSIS — Z86010 Personal history of colonic polyps: Secondary | ICD-10-CM

## 2012-10-29 DIAGNOSIS — R1310 Dysphagia, unspecified: Secondary | ICD-10-CM

## 2012-10-29 DIAGNOSIS — R197 Diarrhea, unspecified: Secondary | ICD-10-CM

## 2012-10-29 DIAGNOSIS — K219 Gastro-esophageal reflux disease without esophagitis: Secondary | ICD-10-CM

## 2012-10-29 DIAGNOSIS — K529 Noninfective gastroenteritis and colitis, unspecified: Secondary | ICD-10-CM | POA: Insufficient documentation

## 2012-10-29 NOTE — Progress Notes (Signed)
Please NIC for OV in 03/2013 to schedule surveillance colonoscopy.   Please let pt know, I would like to screen him for celiac disease with labs. Need to have several days prior to EGD.

## 2012-10-29 NOTE — Assessment & Plan Note (Signed)
?  refractory GERD as source of substernal chest discomfort. Intermittent esophageal solid food dysphagia. NSTEMI with stenting in 02/2012. On Plavix at least for one year. Dr. Swaziland concerned his symptoms could be related to ongoing GERD. Discussed at length with patient. At this point, would recommend EGD+/-ED on Plavix and ASA. EGD will provide better assessment than barium esophagram.  I have discussed the risks, alternatives, benefits with regards to but not limited to the risk of reaction to medication, bleeding, infection, perforation and the patient is agreeable to proceed. Written consent to be obtained.  Discussed antireflux measures. Weight loss can go along way with regards to managing GERD. Patient up additional five pounds.

## 2012-10-29 NOTE — Assessment & Plan Note (Signed)
He is due for surveillance colonoscopy. Colonoscopy can be done on Plavix and ASA however as discussed with patient, he would run increased risk for postpolypectomy bleeding. If numerous polyps or large polyps were found, he would likely require second procedure off of Plavix in order to complete polypectomies. Patient is interested in waiting until 02/2013.

## 2012-10-29 NOTE — Patient Instructions (Signed)
We have scheduled you for an upper endoscopy with Dr. Rourk. Please see separate instructions.  

## 2012-10-29 NOTE — Progress Notes (Signed)
Primary Care Physician:  Carylon Perches, MD  Primary Gastroenterologist:  Roetta Sessions, MD   Chief Complaint  Patient presents with  . Heartburn    HPI:  Tyler Mejia is a 76 y.o. male here for consideration of EGD for refractory GERD. Patient had NSTEMI in 2012/03/24 with DES to the RCA. He is committed to Plavix for one year. He recently saw Dr. Peter Swaziland with c/o DOE, persistent indigestion symptoms worse with spicy/greasy foods. Post-cath he has had PFTs with mild obstructive defect and stress Myoview in 05/2012 without evidence of ischemia and normal EF.   Patient's last EGD/TCS was in 2010 by Dr. Jena Gauss. He had Schatzki's ring, mild erosive RE, and small hh, cecal and hepatic flexure polyps (tubular adenomas). He is actually due for f/u TCS now.   He c/o substernal pain. Worse with food. No burning quality. Sometimes feels like food stops in lower chest. Symptoms worse with laying down. Weight up 5 pounds since 03/24/12. Brother died of esophageal cancer in his early 57s. C/O ongoing DOE. Lomotil every morning. Chronic lomotil bid. States he has had extensive w/u in the past and told he had IBS. Multiple siblings with similar symptoms. Patient wants to wait on TCS until after Mar 24, 2013 due to need for ongoing Plavix/ASA use.    Current Outpatient Prescriptions  Medication Sig Dispense Refill  . amLODipine (NORVASC) 5 MG tablet Take 5 mg by mouth daily.       Marland Kitchen aspirin 81 MG tablet Take 81 mg by mouth daily.      . diphenoxylate-atropine (LOMOTIL) 2.5-0.025 MG per tablet Take 1 tablet by mouth 4 (four) times daily as needed.      . metoprolol tartrate (LOPRESSOR) 25 MG tablet Take 12.5 mg by mouth 2 (two) times daily.       . nitroGLYCERIN (NITROSTAT) 0.4 MG SL tablet Place 1 tablet (0.4 mg total) under the tongue every 5 (five) minutes x 3 doses as needed for chest pain (up to 3 doses).  25 tablet  3  . PANTOPRAZOLE SODIUM PO Take 40 mg by mouth daily.       Marland Kitchen PLAVIX 75 MG tablet TAKE 1 TABLET  DAILY WITH BREAKFAST.  30 tablet  6    Allergies as of 10/29/2012 - Review Complete 10/29/2012  Allergen Reaction Noted  . Codeine Other (See Comments)     Past Medical History  Diagnosis Date  . GERD (gastroesophageal reflux disease)   . HTN (hypertension)   . Hx of adenomatous colonic polyps   . Kidney stones   . NSTEMI (non-ST elevated myocardial infarction) 03-24-2012   DES to the RCA; has residual disease with a 70-80% 1st marginal and a 60-70% mid LCX. Functional study recommended in about 3 months to reassess  . Obesity   . HLD (hyperlipidemia)   . COPD (chronic obstructive pulmonary disease)   . CAD (coronary artery disease)   . IBS (irritable bowel syndrome)     Past Surgical History  Procedure Date  . Appendectomy   . Ruptured disc   . Esophagogastroduodenoscopy 2003    distal erosion/small hiatal hernia  . Colonoscopy 08/21/2004    Pedunculated polyp at the splenic flexure  . Colonoscopy 09/2009    Dr. Rourk--> Cecal and hepatic flexure polyps, tubular adenomas. Next colonoscopy October 2013  . Esophagogastroduodenoscopy 09/2009    Dr. Rinaldo Ratel ring, mild distal esophageal erosions, status post Maloney dilation, small hiatal hernia.  . Back surgery     Family History  Problem Relation Age of Onset  . Pneumonia Father     deceased age 65  . Heart disease Mother     deceased age 94  . Breast cancer Sister     2 sisters  . Esophageal cancer Brother   . Diabetes Mother     History   Social History  . Marital Status: Divorced    Spouse Name: N/A    Number of Children: N/A  . Years of Education: N/A   Occupational History  . retired Animator    Social History Main Topics  . Smoking status: Former Smoker -- 1.5 packs/day for 45 years    Types: Cigarettes    Quit date: 12/09/2001  . Smokeless tobacco: Never Used     Comment: quit about 9 years ago  . Alcohol Use: Yes     Comment: 5 drinks per month  . Drug Use: No  .  Sexually Active: Not Currently   Other Topics Concern  . Not on file   Social History Narrative  . No narrative on file      ROS:  General: Negative for anorexia, weight loss, fever, chills. C/O fatigue, weakness. Eyes: Negative for vision changes.  ENT: Negative for hoarseness, difficulty swallowing , nasal congestion. CV: see hpi Respiratory: Negative for dyspnea at rest, cough, sputum, wheezing. See j[o/ GI: See history of present illness. GU:  Negative for dysuria, hematuria, urinary incontinence, urinary frequency, nocturnal urination.  MS: Negative for joint pain, low back pain.  Derm: Negative for rash or itching.  Neuro: Negative for weakness, abnormal sensation, seizure, frequent headaches, memory loss, confusion.  Psych: Negative for anxiety, depression, suicidal ideation, hallucinations.  Endo: Negative for unusual weight change.  Heme: Negative for bruising or bleeding. Allergy: Negative for rash or hives.    Physical Examination:  BP 139/80  Pulse 65  Temp 97.6 F (36.4 C) (Temporal)  Ht 5\' 9"  (1.753 m)  Wt 247 lb 12.8 oz (112.401 kg)  BMI 36.59 kg/m2   General: Well-nourished, well-developed in no acute distress. obese Head: Normocephalic, atraumatic.   Eyes: Conjunctiva pink, no icterus. Mouth: Oropharyngeal mucosa moist and pink , no lesions erythema or exudate. Neck: Supple without thyromegaly, masses, or lymphadenopathy.  Lungs: Clear to auscultation bilaterally.  Heart: Regular rate and rhythm, no murmurs rubs or gallops.  Abdomen: Bowel sounds are normal, nontender, nondistended, no hepatosplenomegaly or masses, no abdominal bruits or    hernia , no rebound or guarding.   Rectal: not performed Extremities: No lower extremity edema. No clubbing or deformities.  Neuro: Alert and oriented x 4 , grossly normal neurologically.  Skin: Warm and dry, no rash or jaundice.   Psych: Alert and cooperative, normal mood and affect.  Labs: Lab Results    Component Value Date   WBC 9.3 03/11/2012   HGB 14.1 03/11/2012   HCT 42.0 03/11/2012   MCV 86.4 03/11/2012   PLT 279.0 03/11/2012   Lab Results  Component Value Date   CREATININE 0.9 05/13/2012   BUN 19 05/13/2012   NA 142 05/13/2012   K 4.4 05/13/2012   CL 108 05/13/2012   CO2 27 05/13/2012   Lab Results  Component Value Date   ALT 20 05/13/2012   AST 26 05/13/2012   ALKPHOS 65 05/13/2012   BILITOT 1.3* 05/13/2012     Imaging Studies: No results found.

## 2012-10-29 NOTE — Progress Notes (Signed)
Faxed to PCP

## 2012-10-29 NOTE — Assessment & Plan Note (Signed)
Chronic diarrhea for years. Patient reports extensive w/u locally and at [facility] and given IBS diagnosis. Chronic lomotil use works. Consider celiac screen. At time of next colonoscopy, consider random colon biopsies.

## 2012-10-30 NOTE — Progress Notes (Signed)
Reminder in epic to follow up in April 2014 to set up tcs

## 2012-10-30 NOTE — Progress Notes (Signed)
Tried to call pt- LMOM 

## 2012-11-02 NOTE — Progress Notes (Signed)
Pt aware and will have blood work done today.

## 2012-11-03 LAB — TISSUE TRANSGLUTAMINASE, IGA: Tissue Transglutaminase Ab, IgA: 9.3 U/mL (ref <20)

## 2012-11-04 NOTE — Progress Notes (Signed)
Quick Note:    Left message for pt with results  ______

## 2012-11-04 NOTE — Progress Notes (Signed)
Quick Note:  No evidence of celiac. EGD as planned. ______

## 2012-11-10 ENCOUNTER — Encounter (HOSPITAL_COMMUNITY): Payer: Self-pay | Admitting: Pharmacy Technician

## 2012-11-11 ENCOUNTER — Telehealth: Payer: Self-pay | Admitting: Internal Medicine

## 2012-11-11 NOTE — Telephone Encounter (Signed)
Patient called and cancelled his procedure with Dr. Jena Gauss on Monday Dec 9th he wants to wait until after the first of the year he understands he will need a new H&P at that time

## 2012-11-16 ENCOUNTER — Encounter (HOSPITAL_COMMUNITY): Admission: RE | Payer: Self-pay | Source: Ambulatory Visit

## 2012-11-16 ENCOUNTER — Ambulatory Visit (HOSPITAL_COMMUNITY): Admission: RE | Admit: 2012-11-16 | Payer: Medicare Other | Source: Ambulatory Visit | Admitting: Internal Medicine

## 2012-11-16 SURGERY — EGD (ESOPHAGOGASTRODUODENOSCOPY)
Anesthesia: Moderate Sedation

## 2013-02-11 ENCOUNTER — Encounter: Payer: Self-pay | Admitting: *Deleted

## 2013-03-12 ENCOUNTER — Telehealth: Payer: Self-pay | Admitting: Cardiology

## 2013-03-12 NOTE — Telephone Encounter (Signed)
New Problem:     Patient called in wanting to be scheduled to see Dr. Swaziland in May per his recall.  Please call back.

## 2013-03-15 NOTE — Telephone Encounter (Signed)
Returned call to patient no answer.Left message to call back. 

## 2013-03-15 NOTE — Telephone Encounter (Signed)
Patient called appointment scheduled with Dr.Jordan 04/29/13 at 2:45 pm.

## 2013-04-26 ENCOUNTER — Ambulatory Visit (INDEPENDENT_AMBULATORY_CARE_PROVIDER_SITE_OTHER): Payer: Medicare Other | Admitting: Gastroenterology

## 2013-04-26 ENCOUNTER — Encounter: Payer: Self-pay | Admitting: Gastroenterology

## 2013-04-26 VITALS — BP 134/76 | HR 64 | Temp 98.2°F | Ht 68.0 in | Wt 250.6 lb

## 2013-04-26 DIAGNOSIS — K529 Noninfective gastroenteritis and colitis, unspecified: Secondary | ICD-10-CM

## 2013-04-26 DIAGNOSIS — K219 Gastro-esophageal reflux disease without esophagitis: Secondary | ICD-10-CM

## 2013-04-26 DIAGNOSIS — Z86010 Personal history of colonic polyps: Secondary | ICD-10-CM

## 2013-04-26 DIAGNOSIS — R197 Diarrhea, unspecified: Secondary | ICD-10-CM

## 2013-04-26 MED ORDER — PEG 3350-KCL-NA BICARB-NACL 420 G PO SOLR: 4000.0000 mL | ORAL | Status: DC

## 2013-04-26 NOTE — Progress Notes (Signed)
Referring Provider: Carylon Perches, MD Primary Care Physician:  Carylon Perches, MD Primary GI: Dr. Jena Gauss   Chief Complaint  Patient presents with  . Colonoscopy    HPI:   Tyler Mejia is a 77 year old male presenting today to reschedule a surveillance colonoscopy and possibly an upper endoscopy. Last seen in Nov 2013 by our office. His last colonoscopy and upper endoscopy were in 2010 by Dr. Jena Gauss. He had a Schatzki's ring s/p dilation, mild erosive reflux esophagitis, small hiatal hernia, cecal and hepatic flexure polyps (tubular adenomas). He is actually due for f/u TCS now. He has required Plavix secondary to NSTEMI in March 2013.   States he is not breathing well. +DOE. Thinks maybe his weight is putting pressure on his lungs. Reflux improved. Takes Protonix only once in awhile. Occasional esophageal dysphagia if he swallows or drinks too fast. No N/V. Feels bloated after eating fried foods. Chronic diarrhea at baseline. Occasional hemorrhoid issues. Possibly getting off Plavix soon; he is not sure. Sees Dr. Swaziland this Thursday. He states his cardiologist thought it may be best to be off Plavix X 5 days prior to procedures.   Past Medical History  Diagnosis Date  . GERD (gastroesophageal reflux disease)   . HTN (hypertension)   . Hx of adenomatous colonic polyps   . Kidney stones   . NSTEMI (non-ST elevated myocardial infarction) March 2013    DES to the RCA; has residual disease with a 70-80% 1st marginal and a 60-70% mid LCX. Functional study recommended in about 3 months to reassess  . Obesity   . HLD (hyperlipidemia)   . COPD (chronic obstructive pulmonary disease)   . CAD (coronary artery disease)   . IBS (irritable bowel syndrome)     Past Surgical History  Procedure Laterality Date  . Appendectomy    . Ruptured disc    . Esophagogastroduodenoscopy  2003    distal erosion/small hiatal hernia  . Colonoscopy  08/21/2004    Pedunculated polyp at the splenic flexure  . Colonoscopy   09/2009    Dr. Rourk--> Cecal and hepatic flexure polyps, tubular adenomas. Next colonoscopy October 2013  . Esophagogastroduodenoscopy  09/2009    Dr. Rinaldo Ratel ring, mild distal esophageal erosions, status post Maloney dilation, small hiatal hernia.  . Back surgery      Current Outpatient Prescriptions  Medication Sig Dispense Refill  . amLODipine (NORVASC) 5 MG tablet Take 5 mg by mouth daily.       Marland Kitchen aspirin 81 MG tablet Take 81 mg by mouth daily.      . clopidogrel (PLAVIX) 75 MG tablet Take 75 mg by mouth daily.       . diphenoxylate-atropine (LOMOTIL) 2.5-0.025 MG per tablet Take 1 tablet by mouth 4 (four) times daily as needed.      . metoprolol tartrate (LOPRESSOR) 25 MG tablet Take 12.5 mg by mouth 2 (two) times daily.       . nitroGLYCERIN (NITROSTAT) 0.4 MG SL tablet Place 0.4 mg under the tongue every 5 (five) minutes x 3 doses as needed for chest pain (up to 3 doses).      . pantoprazole (PROTONIX) 40 MG tablet Take 40 mg by mouth daily.       No current facility-administered medications for this visit.    Allergies as of 04/26/2013 - Review Complete 04/26/2013  Allergen Reaction Noted  . Codeine Other (See Comments)     Family History  Problem Relation Age of Onset  . Pneumonia Father  deceased age 70  . Heart disease Mother     deceased age 29  . Breast cancer Sister     2 sisters  . Esophageal cancer Brother   . Diabetes Mother     History   Social History  . Marital Status: Divorced    Spouse Name: N/A    Number of Children: N/A  . Years of Education: N/A   Occupational History  . retired Animator    Social History Main Topics  . Smoking status: Former Smoker -- 1.50 packs/day for 45 years    Types: Cigarettes    Quit date: 12/09/2001  . Smokeless tobacco: Never Used     Comment: quit about 9 years ago  . Alcohol Use: Yes     Comment: 5 drinks per month  . Drug Use: No  . Sexually Active: Not Currently   Other Topics  Concern  . None   Social History Narrative  . None    Review of Systems: Gen: Denies fever, chills, anorexia. Denies fatigue, weakness, weight loss.  CV: Denies chest pain, palpitations, syncope, peripheral edema, and claudication. Resp: +DOE GI: SEE HPI Derm: Denies rash, itching, dry skin Psych: Denies depression, anxiety, memory loss, confusion. No homicidal or suicidal ideation.  Heme: Denies bruising, bleeding, and enlarged lymph nodes.  Physical Exam: BP 134/76  Pulse 64  Temp(Src) 98.2 F (36.8 C) (Oral)  Ht 5\' 8"  (1.727 m)  Wt 250 lb 9.6 oz (113.671 kg)  BMI 38.11 kg/m2 General:   Alert and oriented. No distress noted. Pleasant and cooperative.  Head:  Normocephalic and atraumatic. Eyes:  Conjuctiva clear without scleral icterus. Mouth:  Oral mucosa pink and moist. Good dentition. No lesions. Neck:  Supple, without mass or thyromegaly. Heart:  S1, S2 present without murmurs, rubs, or gallops. Regular rate and rhythm. Abdomen:  +BS, soft, obese, non-tender and non-distended. Difficult to appreciate HSM due to large body habitus.  Msk:  Symmetrical without gross deformities. Normal posture. Extremities:  Without edema. Neurologic:  Alert and  oriented x4;  grossly normal neurologically. Skin:  Intact without significant lesions or rashes. Cervical Nodes:  No significant cervical adenopathy. Psych:  Alert and cooperative. Normal mood and affect.

## 2013-04-26 NOTE — Assessment & Plan Note (Signed)
77 year old male with history of tubular adenomas in 2010, due for surveillance now (was actually due in Oct 2013). He has been on Plavix since March 2013 secondary to NSTEMI and stent placement. Sees cardiology in a few days; he tells me his cardiologist will let him know if he can come off of Plavix in the near future. This should not be a hindrance to a colonoscopy in the near future. Chronic diarrhea at baseline, with negative celiac serologies on file.   Proceed with TCS (+/- EGD with dilation, see GERD) with Dr. Jena Gauss in near future: the risks, benefits, and alternatives have been discussed with the patient in detail. The patient states understanding and desires to proceed.

## 2013-04-26 NOTE — Progress Notes (Signed)
Cc PCP 

## 2013-04-26 NOTE — Assessment & Plan Note (Signed)
At baseline. TCS as planned.

## 2013-04-26 NOTE — Patient Instructions (Addendum)
Take Protonix every day. Monitor for any further signs of difficulty swallowing. Make sure you chew well, take small bites, and take small sips. If you have any further issues, we may need to take a look at your upper GI tract at the time of the colonoscopy.  We have scheduled you for a colonoscopy with a possible upper endoscopy with Dr. Jena Gauss in the near future.

## 2013-04-26 NOTE — Assessment & Plan Note (Signed)
Significantly improved since last visit in Nov 2013. Vague dysphagia with eating too fast, drinking too fast. Known Schatzki's ring, with last dilation in 2010. He has only been taking a PPI prn. I have asked him to resume this daily, monitor his dietary habits, and see if he notes any improvement. Discussed possibility of EGD with dilation at time of colonoscopy if he continues to notice any difficulty with swallowing despite daily PPI and behavior modification.  Protonix daily +/- EGD with dilation at time of TCS if appropriate.

## 2013-04-29 ENCOUNTER — Ambulatory Visit (INDEPENDENT_AMBULATORY_CARE_PROVIDER_SITE_OTHER): Payer: Medicare Other | Admitting: Cardiology

## 2013-04-29 ENCOUNTER — Encounter: Payer: Self-pay | Admitting: Cardiology

## 2013-04-29 ENCOUNTER — Other Ambulatory Visit: Payer: Self-pay

## 2013-04-29 VITALS — BP 132/74 | HR 66 | Ht 69.0 in | Wt 246.0 lb

## 2013-04-29 DIAGNOSIS — I10 Essential (primary) hypertension: Secondary | ICD-10-CM

## 2013-04-29 DIAGNOSIS — E785 Hyperlipidemia, unspecified: Secondary | ICD-10-CM

## 2013-04-29 DIAGNOSIS — I2510 Atherosclerotic heart disease of native coronary artery without angina pectoris: Secondary | ICD-10-CM

## 2013-04-29 DIAGNOSIS — R0602 Shortness of breath: Secondary | ICD-10-CM

## 2013-04-29 DIAGNOSIS — I259 Chronic ischemic heart disease, unspecified: Secondary | ICD-10-CM

## 2013-04-29 NOTE — Progress Notes (Signed)
Tyler Mejia Date of Birth: [birth year] Medical Record #[id number]  History of Present Illness: Tyler Mejia is seen back today for a followup visit. He had a NSTEMI in March 2013 with DES to the RCA.  He does have moderate residual disease in a marginal branch and in the LCX. He complains of refractory symptoms of shortness of breath on exertion. He was evaluated by Tyler Mejia in pulmonary and PFTs showed mild obstructive defect. He was placed on an inhaler without significant improvement. He has been on Protonix and reports that his acid reflux symptoms are improved.  Because of  recurrent dyspnea he did undergo a stress Myoview study in June 2013 which showed no evidence of ischemia and normal ejection fraction. He does not do any exercise. He states he does get short of breath and tired if he tries to do anything. He eats a lot of junk food. He is scheduled for colonoscopy in late May.  Current Outpatient Prescriptions on File Prior to Visit  Medication Sig Dispense Refill  . amLODipine (NORVASC) 5 MG tablet Take 5 mg by mouth daily.       Marland Kitchen aspirin 81 MG tablet Take 81 mg by mouth daily.      . diphenoxylate-atropine (LOMOTIL) 2.5-0.025 MG per tablet Take 1 tablet by mouth 4 (four) times daily as needed.      . nitroGLYCERIN (NITROSTAT) 0.4 MG SL tablet Place 0.4 mg under the tongue every 5 (five) minutes x 3 doses as needed for chest pain (up to 3 doses).      . pantoprazole (PROTONIX) 40 MG tablet Take 40 mg by mouth daily.      . polyethylene glycol-electrolytes (TRILYTE) 420 G solution Take 4,000 mLs by mouth as directed.  4000 mL  0   No current facility-administered medications on file prior to visit.    Allergies  Allergen Reactions  . Codeine Other (See Comments)    Does not know     Past Medical History  Diagnosis Date  . GERD (gastroesophageal reflux disease)   . HTN (hypertension)   . Hx of adenomatous colonic polyps   . Kidney stones   . NSTEMI (non-ST elevated  myocardial infarction) March 2013    DES to the RCA; has residual disease with a 70-80% 1st marginal and a 60-70% mid LCX. Functional study recommended in about 3 months to reassess  . Obesity   . HLD (hyperlipidemia)   . COPD (chronic obstructive pulmonary disease)   . CAD (coronary artery disease)   . IBS (irritable bowel syndrome)     Past Surgical History  Procedure Laterality Date  . Appendectomy    . Ruptured disc    . Esophagogastroduodenoscopy  2003    distal erosion/small hiatal hernia  . Colonoscopy  08/21/2004    Pedunculated polyp at the splenic flexure  . Colonoscopy  09/2009    Tyler Mejia--> Cecal and hepatic flexure polyps, tubular adenomas. Next colonoscopy October 2013  . Esophagogastroduodenoscopy  09/2009    Tyler Mejia ring, mild distal esophageal erosions, status post Maloney dilation, small hiatal hernia.  . Back surgery      History  Smoking status  . Former Smoker -- 1.50 packs/day for 45 years  . Types: Cigarettes  . Quit date: 12/09/2001  Smokeless tobacco  . Never Used    Comment: quit about 9 years ago    History  Alcohol Use  . Yes    Comment: 5 drinks per month  Family History  Problem Relation Age of Onset  . Pneumonia Father     deceased age 17  . Heart disease Mother     deceased age 45  . Breast cancer Sister     2 sisters  . Esophageal cancer Brother   . Diabetes Mother     Review of Systems: The review of systems is per the HPI.  All other systems were reviewed and are negative.  Physical Exam: BP 132/74  Pulse 66  Ht 5\' 9"  (1.753 m)  Wt 246 lb (111.585 kg)  BMI 36.31 kg/m2 Patient is an obese, pleasant white male in no acute distress.  Skin is warm and dry. Color is normal.  HEENT is unremarkable. Normocephalic/atraumatic. PERRL. Sclera are nonicteric. Neck is supple. No masses. No JVD. Lungs are clear. Cardiac exam shows a regular rate and rhythm. Abdomen is obese but soft. Extremities are without edema. Gait  and ROM are intact. No gross neurologic deficits noted.  LABORATORY DATA:  ECG today is normal. Rate is 66 beats per minute Lab Results  Component Value Date   WBC 9.3 03/11/2012   HGB 14.1 03/11/2012   HCT 42.0 03/11/2012   PLT 279.0 03/11/2012   GLUCOSE 95 05/13/2012   CHOL 101 05/13/2012   TRIG 122.0 05/13/2012   HDL 31.00* 05/13/2012   LDLCALC 46 05/13/2012   ALT 20 05/13/2012   AST 26 05/13/2012   NA 142 05/13/2012   K 4.4 05/13/2012   CL 108 05/13/2012   CREATININE 0.9 05/13/2012   BUN 19 05/13/2012   CO2 27 05/13/2012   INR 1.10 02/28/2012     Assessment / Plan: 1. Coronary disease status post non-ST elevation myocardial infarction March of 2013. He had stenting of the right coronary with a long drug-eluting stent. He had moderate disease noted in the obtuse marginal vessels. Followup Myoview study was negative in June. He may stop Plavix at this point. Continue aspirin indefinitely.  2. Dyspnea. Multifactorial. I think this is primarily related to his morbid obesity and poor conditioning. Recommend regular exercise and weight loss. We will stop his metoprolol at this point to see if this will help.  3. GERD controlled with Protonix.  4. Hypertension, controlled.  5. Morbid obesity.  6. Hyperlipidemia on Pravachol.

## 2013-04-29 NOTE — Patient Instructions (Signed)
Stop Plavix  Reduce metoprolol to one a day for 4 days then stop completely.  Continue your other therapy  You need to exercise daily and lose weight- eat a heart healthy diet.  I will see you in 6 months.

## 2013-04-30 ENCOUNTER — Encounter (HOSPITAL_COMMUNITY): Payer: Self-pay | Admitting: Pharmacy Technician

## 2013-05-07 ENCOUNTER — Encounter (HOSPITAL_COMMUNITY): Payer: Self-pay | Admitting: *Deleted

## 2013-05-07 ENCOUNTER — Encounter (HOSPITAL_COMMUNITY)
Admission: RE | Disposition: A | Discharge status: Home / Self Care | Payer: Self-pay | Source: Ambulatory Visit | Attending: Internal Medicine

## 2013-05-07 ENCOUNTER — Ambulatory Visit (HOSPITAL_COMMUNITY)
Admission: RE | Admit: 2013-05-07 | Discharge: 2013-05-07 | Disposition: A | Discharge status: Home / Self Care | Payer: Medicare Other | Source: Ambulatory Visit | Attending: Internal Medicine | Admitting: Internal Medicine

## 2013-05-07 DIAGNOSIS — D126 Benign neoplasm of colon, unspecified: Secondary | ICD-10-CM | POA: Insufficient documentation

## 2013-05-07 DIAGNOSIS — Z1211 Encounter for screening for malignant neoplasm of colon: Secondary | ICD-10-CM

## 2013-05-07 DIAGNOSIS — Z860100 Personal history of colon polyps, unspecified: Secondary | ICD-10-CM | POA: Insufficient documentation

## 2013-05-07 DIAGNOSIS — K5730 Diverticulosis of large intestine without perforation or abscess without bleeding: Secondary | ICD-10-CM

## 2013-05-07 DIAGNOSIS — K529 Noninfective gastroenteritis and colitis, unspecified: Secondary | ICD-10-CM

## 2013-05-07 DIAGNOSIS — J449 Chronic obstructive pulmonary disease, unspecified: Secondary | ICD-10-CM | POA: Insufficient documentation

## 2013-05-07 DIAGNOSIS — K219 Gastro-esophageal reflux disease without esophagitis: Secondary | ICD-10-CM

## 2013-05-07 DIAGNOSIS — J4489 Other specified chronic obstructive pulmonary disease: Secondary | ICD-10-CM | POA: Insufficient documentation

## 2013-05-07 DIAGNOSIS — I10 Essential (primary) hypertension: Secondary | ICD-10-CM | POA: Insufficient documentation

## 2013-05-07 HISTORY — PX: COLONOSCOPY: SHX5424

## 2013-05-07 SURGERY — COLONOSCOPY
Anesthesia: Moderate Sedation

## 2013-05-07 MED ORDER — ONDANSETRON HCL 4 MG/2ML IJ SOLN
INTRAMUSCULAR | Status: DC | PRN
  Administered 2013-05-07: 4 mg via INTRAVENOUS

## 2013-05-07 MED ORDER — MEPERIDINE HCL 100 MG/ML IJ SOLN
INTRAMUSCULAR | Status: DC | PRN
  Administered 2013-05-07 (×2): 50 mg via INTRAVENOUS

## 2013-05-07 MED ORDER — MEPERIDINE HCL 100 MG/ML IJ SOLN
INTRAMUSCULAR | Status: AC
  Filled 2013-05-07: qty 1

## 2013-05-07 MED ORDER — SODIUM CHLORIDE 0.9 % IV SOLN
INTRAVENOUS | Status: DC
  Administered 2013-05-07: 09:00:00 via INTRAVENOUS

## 2013-05-07 MED ORDER — MIDAZOLAM HCL 5 MG/5ML IJ SOLN
INTRAMUSCULAR | Status: AC
  Filled 2013-05-07: qty 5

## 2013-05-07 MED ORDER — ONDANSETRON HCL 4 MG/2ML IJ SOLN
INTRAMUSCULAR | Status: AC
  Filled 2013-05-07: qty 2

## 2013-05-07 MED ORDER — MIDAZOLAM HCL 5 MG/5ML IJ SOLN
INTRAMUSCULAR | Status: DC | PRN
  Administered 2013-05-07 (×2): 2 mg via INTRAVENOUS

## 2013-05-07 MED ORDER — STERILE WATER FOR IRRIGATION IR SOLN
Status: DC | PRN
  Administered 2013-05-07: 09:00:00

## 2013-05-07 NOTE — Op Note (Signed)
[facility] [address] [place], [address]   COLONOSCOPY PROCEDURE REPORT  PATIENT: Tyler, Mejia  MR#:         [id number] BIRTHDATE: 1936/10/05 , 76  yrs. old GENDER: Male ENDOSCOPIST: R.  Roetta Sessions, MD FACP FACG REFERRED BY:  Carylon Perches, M.D.  Peter [place], M.D. PROCEDURE DATE:  05/07/2013 PROCEDURE:     Colonoscopy with snare polypectomy  INDICATIONS: History of colonic adenoma  INFORMED CONSENT:  The risks, benefits, alternatives and imponderables including but not limited to bleeding, perforation as well as the possibility of a missed lesion have been reviewed.  The potential for biopsy, lesion removal, etc. have also been discussed.  Questions have been answered.  All parties agreeable. Please see the history and physical in the medical record for more information.  MEDICATIONS: Versed 4 mg IV and Demerol 100 mg IV in divided doses. Zofran 4 mg IV  DESCRIPTION OF PROCEDURE:  After a digital rectal exam was performed, the EC-[id number] ([id number])  colonoscope was advanced from the anus through the rectum and colon to the area of the cecum, ileocecal valve and appendiceal orifice.  The cecum was deeply intubated.  These structures were well-seen and photographed for the record.  From the level of the cecum and ileocecal valve, the scope was slowly and cautiously withdrawn.  The mucosal surfaces were carefully surveyed utilizing scope tip deflection to facilitate fold flattening as needed.  The scope was pulled down into the rectum where a thorough examination including retroflexion was performed.    FINDINGS:  Adequate preparation. Normal rectum. Scattered left-sided diverticula; (1) 3 mm polyp in the mid descending segment; otherwise, the remainder of the colonic mucosa appeared normal.  THERAPEUTIC / DIAGNOSTIC MANEUVERS PERFORMED:  The above-mentioned polyp was cold snare removed and recovered for the pathologist  COMPLICATIONS: None  CECAL  WITHDRAWAL TIME:  8 minutes  IMPRESSION:  Colonic polyp-removed as described above. Colonic diverticulosis.  RECOMMENDATIONS: Followup on pathology. As far as reflux symptoms are concerned, patient should take his Protonix 40 mg every day without fail regardless of whether or not he perceives reflux symptoms given history of Schatzki's ring requiring dilation. previously.   _______________________________ eSigned:  R. Roetta Sessions, MD FACP [facility] 05/07/2013 10:52 AM   CC:

## 2013-05-07 NOTE — H&P (View-Only) (Signed)
 Referring Provider: Fagan, Roy, MD Primary Care Physician:  FAGAN,ROY, MD Primary GI: Dr. Rourk   Chief Complaint  Patient presents with  . Colonoscopy    HPI:   Mr. Tyler Mejia is a 76-year-old male presenting today to reschedule a surveillance colonoscopy and possibly an upper endoscopy. Last seen in Nov 2013 by our office. His last colonoscopy and upper endoscopy were in 2010 by Dr. Rourk. He had a Schatzki's ring s/p dilation, mild erosive reflux esophagitis, small hiatal hernia, cecal and hepatic flexure polyps (tubular adenomas). He is actually due for f/u TCS now. He has required Plavix secondary to NSTEMI in March 2013.   States he is not breathing well. +DOE. Thinks maybe his weight is putting pressure on his lungs. Reflux improved. Takes Protonix only once in awhile. Occasional esophageal dysphagia if he swallows or drinks too fast. No N/V. Feels bloated after eating fried foods. Chronic diarrhea at baseline. Occasional hemorrhoid issues. Possibly getting off Plavix soon; he is not sure. Sees Dr. Jordan this Thursday. He states his cardiologist thought it may be best to be off Plavix X 5 days prior to procedures.   Past Medical History  Diagnosis Date  . GERD (gastroesophageal reflux disease)   . HTN (hypertension)   . Hx of adenomatous colonic polyps   . Kidney stones   . NSTEMI (non-ST elevated myocardial infarction) March 2013    DES to the RCA; has residual disease with a 70-80% 1st marginal and a 60-70% mid LCX. Functional study recommended in about 3 months to reassess  . Obesity   . HLD (hyperlipidemia)   . COPD (chronic obstructive pulmonary disease)   . CAD (coronary artery disease)   . IBS (irritable bowel syndrome)     Past Surgical History  Procedure Laterality Date  . Appendectomy    . Ruptured disc    . Esophagogastroduodenoscopy  2003    distal erosion/small hiatal hernia  . Colonoscopy  08/21/2004    Pedunculated polyp at the splenic flexure  . Colonoscopy   09/2009    Dr. Rourk--> Cecal and hepatic flexure polyps, tubular adenomas. Next colonoscopy October 2013  . Esophagogastroduodenoscopy  09/2009    Dr. Rourk-->Schatzki ring, mild distal esophageal erosions, status post Maloney dilation, small hiatal hernia.  . Back surgery      Current Outpatient Prescriptions  Medication Sig Dispense Refill  . amLODipine (NORVASC) 5 MG tablet Take 5 mg by mouth daily.       . aspirin 81 MG tablet Take 81 mg by mouth daily.      . clopidogrel (PLAVIX) 75 MG tablet Take 75 mg by mouth daily.       . diphenoxylate-atropine (LOMOTIL) 2.5-0.025 MG per tablet Take 1 tablet by mouth 4 (four) times daily as needed.      . metoprolol tartrate (LOPRESSOR) 25 MG tablet Take 12.5 mg by mouth 2 (two) times daily.       . nitroGLYCERIN (NITROSTAT) 0.4 MG SL tablet Place 0.4 mg under the tongue every 5 (five) minutes x 3 doses as needed for chest pain (up to 3 doses).      . pantoprazole (PROTONIX) 40 MG tablet Take 40 mg by mouth daily.       No current facility-administered medications for this visit.    Allergies as of 04/26/2013 - Review Complete 04/26/2013  Allergen Reaction Noted  . Codeine Other (See Comments)     Family History  Problem Relation Age of Onset  . Pneumonia Father       deceased age 90  . Heart disease Mother     deceased age 92  . Breast cancer Sister     2 sisters  . Esophageal cancer Brother   . Diabetes Mother     History   Social History  . Marital Status: Divorced    Spouse Name: N/A    Number of Children: N/A  . Years of Education: N/A   Occupational History  . retired building contractor    Social History Main Topics  . Smoking status: Former Smoker -- 1.50 packs/day for 45 years    Types: Cigarettes    Quit date: 12/09/2001  . Smokeless tobacco: Never Used     Comment: quit about 9 years ago  . Alcohol Use: Yes     Comment: 5 drinks per month  . Drug Use: No  . Sexually Active: Not Currently   Other Topics  Concern  . None   Social History Narrative  . None    Review of Systems: Gen: Denies fever, chills, anorexia. Denies fatigue, weakness, weight loss.  CV: Denies chest pain, palpitations, syncope, peripheral edema, and claudication. Resp: +DOE GI: SEE HPI Derm: Denies rash, itching, dry skin Psych: Denies depression, anxiety, memory loss, confusion. No homicidal or suicidal ideation.  Heme: Denies bruising, bleeding, and enlarged lymph nodes.  Physical Exam: BP 134/76  Pulse 64  Temp(Src) 98.2 F (36.8 C) (Oral)  Ht 5' 8" (1.727 m)  Wt 250 lb 9.6 oz (113.671 kg)  BMI 38.11 kg/m2 General:   Alert and oriented. No distress noted. Pleasant and cooperative.  Head:  Normocephalic and atraumatic. Eyes:  Conjuctiva clear without scleral icterus. Mouth:  Oral mucosa pink and moist. Good dentition. No lesions. Neck:  Supple, without mass or thyromegaly. Heart:  S1, S2 present without murmurs, rubs, or gallops. Regular rate and rhythm. Abdomen:  +BS, soft, obese, non-tender and non-distended. Difficult to appreciate HSM due to large body habitus.  Msk:  Symmetrical without gross deformities. Normal posture. Extremities:  Without edema. Neurologic:  Alert and  oriented x4;  grossly normal neurologically. Skin:  Intact without significant lesions or rashes. Cervical Nodes:  No significant cervical adenopathy. Psych:  Alert and cooperative. Normal mood and affect.   

## 2013-05-07 NOTE — Interval H&P Note (Signed)
History and Physical Interval Note:  05/07/2013 9:26 AM  Tyler Mejia  has presented today for surgery, with the diagnosis of COLON POLYPS AND GERD  The various methods of treatment have been discussed with the patient and family. After consideration of risks, benefits and other options for treatment, the patient has consented to  Procedure(s) with comments: COLONOSCOPY (N/A) - 9:15 ESOPHAGOGASTRODUODENOSCOPY (EGD) WITH ESOPHAGEAL DILATION (N/A) as a surgical intervention .  The patient's history has been reviewed, patient examined, no change in status, stable for surgery.  I have reviewed the patient's chart and labs.  Questions were answered to the patient's satisfaction.     Eula Listen  Patient with occasional reflux symptoms  - no ongoing esophageal dysphagia. Therefore, we'll recommend he take Protonix every day and we'll hold off on EGD today. Colonoscopy, however, today for polyp surveillance. The risks, benefits, limitations, alternatives and imponderables have been reviewed with the patient. Questions have been answered. All parties are agreeable.

## 2013-05-10 ENCOUNTER — Encounter (HOSPITAL_COMMUNITY): Payer: Self-pay | Admitting: Internal Medicine

## 2013-05-12 ENCOUNTER — Encounter: Payer: Self-pay | Admitting: Internal Medicine

## 2013-07-23 ENCOUNTER — Encounter: Payer: Self-pay | Admitting: Internal Medicine

## 2013-07-23 ENCOUNTER — Ambulatory Visit (INDEPENDENT_AMBULATORY_CARE_PROVIDER_SITE_OTHER): Payer: Medicare Other | Admitting: Internal Medicine

## 2013-07-23 VITALS — BP 158/80 | HR 83 | Ht 69.0 in | Wt 249.3 lb

## 2013-07-23 DIAGNOSIS — R079 Chest pain, unspecified: Secondary | ICD-10-CM

## 2013-07-23 DIAGNOSIS — I10 Essential (primary) hypertension: Secondary | ICD-10-CM

## 2013-07-23 DIAGNOSIS — R0602 Shortness of breath: Secondary | ICD-10-CM

## 2013-07-23 DIAGNOSIS — R0600 Dyspnea, unspecified: Secondary | ICD-10-CM

## 2013-07-23 DIAGNOSIS — E785 Hyperlipidemia, unspecified: Secondary | ICD-10-CM

## 2013-07-23 DIAGNOSIS — R0609 Other forms of dyspnea: Secondary | ICD-10-CM

## 2013-07-23 DIAGNOSIS — I2510 Atherosclerotic heart disease of native coronary artery without angina pectoris: Secondary | ICD-10-CM

## 2013-07-23 DIAGNOSIS — I214 Non-ST elevation (NSTEMI) myocardial infarction: Secondary | ICD-10-CM

## 2013-07-23 DIAGNOSIS — J449 Chronic obstructive pulmonary disease, unspecified: Secondary | ICD-10-CM

## 2013-07-23 DIAGNOSIS — I259 Chronic ischemic heart disease, unspecified: Secondary | ICD-10-CM

## 2013-07-23 NOTE — Patient Instructions (Addendum)
Dr Rennis Golden would like you to have metabolic test. This test is done on Mondays.  What is a Cardiopulmonary Exercise Test (CPET)?  The Cardiopulmonary Exercise Test is a highly sensitive, non-invasive stress test. It is considered a stress test because the exercise stresses your body's systems by making them work faster and harder. A disease or condition that affects the heart, lungs or muscles will limit how much faster and harder these systems can work. A CPET assesses how well the heart, lungs, and muscles are working individually, and how these systems are working in unison. Your heart and lungs work together to deliver oxygen to your muscles, where it is used to make energy, and to remove carbon dioxide from your body.  The full cardiopulmonary system is assessed during a CPET by measuring the amount of oxygen your body is using, the amount of carbon dioxide it is producing, your breathing pattern, and electrocardiogram (EKG) while you are riding a stationary bicycle.  Dr Rennis Golden would like you to follow up in about 1-2 weeks, after your test.

## 2013-07-24 ENCOUNTER — Encounter: Payer: Self-pay | Admitting: Internal Medicine

## 2013-07-24 NOTE — Progress Notes (Signed)
OFFICE NOTE  Chief Complaint:  Shortness of breath, second opinion  Primary Care Physician: Carylon Perches, MD  HPI:  Tyler Mejia is a pleasant 77 year old gentleman who is followed by Dr. Peter Sw[place]and of [facility] for prior coronary artery disease. He underwent a stent in March 2013 for a non-ST elevation MI. Subsequent to that he had stress testing which was negative for ischemia. He reports having some lightheadedness and dizziness mild version of a swelling but mostly shortness of breath. He is concerned that this might be esophageal cancer as his brother is currently struggling for this. Denies any trouble swallowing, GERD symptoms or dyspepsia. He recently saw Dr. Swaziland in May and was referred to see Dr. Shelle Iron in pulmonary for PFTs. These were mildly abnormal and he was placed on inhaler but reports no improvement in his symptoms. Dr. Swaziland felt that this may be deconditioning and obesity and recommended stopping his beta blocker.  Despite these changes he has had no significant improvement in his breathing. He is also not managed to lose any weight or start to do any exercise.  PMHx:  Past Medical History  Diagnosis Date  . GERD (gastroesophageal reflux disease)   . HTN (hypertension)   . Hx of adenomatous colonic polyps   . Kidney stones   . NSTEMI (non-ST elevated myocardial infarction) March 2013    DES to the RCA; has residual disease with a 70-80% 1st marginal and a 60-70% mid LCX. Functional study recommended in about 3 months to reassess  . Obesity   . HLD (hyperlipidemia)   . COPD (chronic obstructive pulmonary disease)   . CAD (coronary artery disease)   . IBS (irritable bowel syndrome)     Past Surgical History  Procedure Laterality Date  . Appendectomy    . Ruptured disc    . Esophagogastroduodenoscopy  2003    distal erosion/small hiatal hernia  . Colonoscopy  08/21/2004    Pedunculated polyp at the splenic flexure  . Colonoscopy  09/2009    Dr.  Rourk--> Cecal and hepatic flexure polyps, tubular adenomas. Next colonoscopy October 2013  . Esophagogastroduodenoscopy  09/2009    Dr. Rinaldo Ratel ring, mild distal esophageal erosions, status post Maloney dilation, small hiatal hernia.  . Back surgery    . Colonoscopy N/A 05/07/2013    Procedure: COLONOSCOPY;  Surgeon: Corbin Ade, MD;  Location: [facility];  Service: Endoscopy;  Laterality: N/A;  9:15    FAMHx:  Family History  Problem Relation Age of Onset  . Pneumonia Father     deceased age 69  . Heart disease Mother     deceased age 31  . Breast cancer Sister     2 sisters  . Esophageal cancer Brother   . Diabetes Mother     SOCHx:   reports that he quit smoking about 11 years ago. His smoking use included Cigarettes. He has a 67.5 pack-year smoking history. He has never used smokeless tobacco. He reports that he does not drink alcohol or use illicit drugs.  ALLERGIES:  Allergies  Allergen Reactions  . Codeine Other (See Comments)    Does not know     ROS: A comprehensive review of systems was negative except for: Constitutional: positive for fatigue Respiratory: positive for dyspnea on exertion Cardiovascular: positive for fatigue Behavioral/Psych: positive for anxiety  HOME MEDS: Current Outpatient Prescriptions  Medication Sig Dispense Refill  . amLODipine (NORVASC) 5 MG tablet Take 5 mg by mouth daily.       Marland Kitchen  aspirin EC 81 MG tablet Take 81 mg by mouth daily.      . diphenoxylate-atropine (LOMOTIL) 2.5-0.025 MG per tablet Take 1 tablet by mouth 4 (four) times daily as needed for diarrhea or loose stools.       Marland Kitchen etodolac (LODINE) 400 MG tablet Take 400 mg by mouth 2 (two) times daily.      . nitroGLYCERIN (NITROSTAT) 0.4 MG SL tablet Place 0.4 mg under the tongue every 5 (five) minutes x 3 doses as needed for chest pain (up to 3 doses).      . pantoprazole (PROTONIX) 40 MG tablet Take 40 mg by mouth daily.       No current facility-administered  medications for this visit.    LABS/IMAGING: No results found for this or any previous visit (from the past 48 hour(s)). No results found.  VITALS: BP 158/80  Pulse 83  Ht 5\' 9"  (1.753 m)  Wt 249 lb 4.8 oz (113.082 kg)  BMI 36.8 kg/m2  EXAM: General appearance: alert, no distress and moderately obese Neck: no adenopathy, no carotid bruit, no JVD, supple, symmetrical, trachea midline and thyroid not enlarged, symmetric, no tenderness/mass/nodules Lungs: diminished breath sounds bibasilar Heart: regular rate and rhythm, S1, S2 normal, no murmur, click, rub or gallop Abdomen: protuberant, somewhat firm, difficult to palpate abdominal organs Extremities: extremities normal, atraumatic, no cyanosis or edema Pulses: 2+ and symmetric Skin: Skin color, texture, turgor normal. No rashes or lesions Neurologic: Grossly normal  EKG: Normal sinus rhythm at 83  ASSESSMENT: 1. Dyspnea on exertion, likely related to obesity and deconditioning 2. Coronary disease status post PCI with a DES to the RCA in 02/2012 for NSTEMI, moderate residual OM and LCx disease - negative NST 3. Mildly abnormal PFT's, no improvement with inhalers  PLAN: 1.   Tyler Mejia continues to describe dyspnea on exertion which he reported was present before his MI.  I do not suspect it is related to his coronary disease, despite his moderate lesions of the OM and LCX - stress test evaluation of this was negative. He gets markedly short of breath going up hills or bending over to tie his shoes, which seems to be more related to abdominal obesity. I would like to see what happens to his VO2 with exercise and have recommended cardiometabolic testing. This may give [place] a better picture of why he continues to be dyspneic.  Chrystie Nose, MD, [facility] Attending Cardiologist [facility]  HILTY,Kenneth C 07/24/2013, 12:52 PM

## 2013-08-05 ENCOUNTER — Ambulatory Visit: Payer: Medicare Other | Admitting: Cardiovascular Disease

## 2013-08-10 ENCOUNTER — Encounter (INDEPENDENT_AMBULATORY_CARE_PROVIDER_SITE_OTHER): Payer: Medicare Other

## 2013-08-10 DIAGNOSIS — R0602 Shortness of breath: Secondary | ICD-10-CM

## 2013-08-13 ENCOUNTER — Encounter: Payer: Self-pay | Admitting: Internal Medicine

## 2013-08-16 ENCOUNTER — Ambulatory Visit: Payer: Medicare Other | Admitting: Internal Medicine

## 2013-08-23 ENCOUNTER — Encounter: Payer: Self-pay | Admitting: Internal Medicine

## 2013-08-23 ENCOUNTER — Ambulatory Visit (INDEPENDENT_AMBULATORY_CARE_PROVIDER_SITE_OTHER): Payer: Medicare Other | Admitting: Internal Medicine

## 2013-08-23 VITALS — BP 140/82 | HR 78 | Ht 69.0 in | Wt 251.9 lb

## 2013-08-23 DIAGNOSIS — R0602 Shortness of breath: Secondary | ICD-10-CM

## 2013-08-23 DIAGNOSIS — J449 Chronic obstructive pulmonary disease, unspecified: Secondary | ICD-10-CM

## 2013-08-23 DIAGNOSIS — I2510 Atherosclerotic heart disease of native coronary artery without angina pectoris: Secondary | ICD-10-CM

## 2013-08-23 DIAGNOSIS — I259 Chronic ischemic heart disease, unspecified: Secondary | ICD-10-CM

## 2013-08-23 NOTE — Progress Notes (Signed)
OFFICE NOTE  Chief Complaint:  Shortness of breath, second opinion  Primary Care Physician: Carylon Perches, MD  HPI:  Tyler Mejia is a pleasant 77 year old gentleman who is followed by Dr. Peter Swaziland of [facility] for prior coronary artery disease. He underwent a stent in March 2013 for a non-ST elevation MI. Subsequent to that he had stress testing which was negative for ischemia. He reports having some lightheadedness and dizziness mild version of a swelling but mostly shortness of breath. He is concerned that this might be esophageal cancer as his brother is currently struggling for this. Denies any trouble swallowing, GERD symptoms or dyspepsia. He recently saw Dr. Swaziland in May and was referred to see Dr. Shelle Iron in pulmonary for PFTs. These were mildly abnormal and he was placed on inhaler but reports no improvement in his symptoms. Dr. Swaziland felt that this may be deconditioning and obesity and recommended stopping his beta blocker.  Despite these changes he has had no significant improvement in his breathing. He is also not managed to lose any weight or start to do any exercise.  I recommended metabolic testing and he underwent that on 08/10/2013. His RAR was 0.95, PCO2 80% predicted. Heart rate was 77% predicted and he is here 2 and heart rate curves do show a high metabolic cause of leg movement consistent with obesity during free pedaling. The heart rate in the 2 curves do parallel each other well without any evidence of the acute flattening. Effort was not maximal.  PMHx:  Past Medical History  Diagnosis Date  . GERD (gastroesophageal reflux disease)   . HTN (hypertension)   . Hx of adenomatous colonic polyps   . Kidney stones   . NSTEMI (non-ST elevated myocardial infarction) March 2013    DES to the RCA; has residual disease with a 70-80% 1st marginal and a 60-70% mid LCX. Functional study recommended in about 3 months to reassess  . Obesity   . HLD (hyperlipidemia)     . COPD (chronic obstructive pulmonary disease)   . CAD (coronary artery disease)   . IBS (irritable bowel syndrome)     Past Surgical History  Procedure Laterality Date  . Appendectomy    . Ruptured disc    . Esophagogastroduodenoscopy  2003    distal erosion/small hiatal hernia  . Colonoscopy  08/21/2004    Pedunculated polyp at the splenic flexure  . Colonoscopy  09/2009    Dr. Rourk--> Cecal and hepatic flexure polyps, tubular adenomas. Next colonoscopy October 2013  . Esophagogastroduodenoscopy  09/2009    Dr. Rinaldo Ratel ring, mild distal esophageal erosions, status post Maloney dilation, small hiatal hernia.  . Back surgery    . Colonoscopy N/A 05/07/2013    Procedure: COLONOSCOPY;  Surgeon: Corbin Ade, MD;  [facility];  Service: Endoscopy;  Laterality: N/A;  9:15    FAMHx:  Family History  Problem Relation Age of Onset  . Pneumonia Father     deceased age 41  . Heart disease Mother     deceased age 48  . Breast cancer Sister     2 sisters  . Esophageal cancer Brother   . Diabetes Mother     SOCHx:   reports that he quit smoking about 11 years ago. His smoking use included Cigarettes. He has a 67.5 pack-year smoking history. He has never used smokeless tobacco. He reports that he does not drink alcohol or use illicit drugs.  ALLERGIES:  Allergies  Allergen Reactions  .  Codeine Other (See Comments)    Does not know     ROS: A comprehensive review of systems was negative except for: Constitutional: positive for fatigue Respiratory: positive for dyspnea on exertion Cardiovascular: positive for fatigue Behavioral/Psych: positive for anxiety  HOME MEDS: Current Outpatient Prescriptions  Medication Sig Dispense Refill  . amLODipine (NORVASC) 5 MG tablet Take 5 mg by mouth daily.       Marland Kitchen aspirin EC 81 MG tablet Take 81 mg by mouth daily.      . diphenoxylate-atropine (LOMOTIL) 2.5-0.025 MG per tablet Take 1 tablet by mouth 4 (four) times  daily as needed for diarrhea or loose stools.       Marland Kitchen etodolac (LODINE) 400 MG tablet Take 400 mg by mouth 2 (two) times daily.      . nitroGLYCERIN (NITROSTAT) 0.4 MG SL tablet Place 0.4 mg under the tongue every 5 (five) minutes x 3 doses as needed for chest pain (up to 3 doses).      . pantoprazole (PROTONIX) 40 MG tablet Take 40 mg by mouth daily.       No current facility-administered medications for this visit.    LABS/IMAGING: No results found for this or any previous visit (from the past 48 hour(s)). No results found.  VITALS: BP 140/82  Pulse 78  Ht 5\' 9"  (1.753 m)  Wt 251 lb 14.4 oz (114.261 kg)  BMI 37.18 kg/m2  EXAM: deferred  EKG: deferred  ASSESSMENT: 1. Dyspnea on exertion, likely related to obesity and deconditioning - this is confirmed from his CMET.   2. Coronary disease status post PCI with a DES to the RCA in 02/2012 for NSTEMI, moderate residual OM and LCx disease - negative NST 3. Mildly abnormal PFT's, no improvement with inhalers  PLAN: 1.   Mr. Handler has signs of decreased PO2 with exertion that appears to be due in part to his weight and deconditioning. While there may be some mild ventilatory limitation, I think more of his issues are do to excess weight and poor exercise tolerance. We talked about ways he can work to improve his cardiovascular conditioning starting slowly with increased walking and periods of stopping as needed. He will need continued followup due to his history of coronary disease and risk factors. He has indicated he wished to followup with me. We'll plan that annually or sooner as necessary.  Chrystie Nose, MD, [facility] Attending Cardiologist [facility]  Zaelyn Noack C 08/23/2013, 12:22 PM

## 2013-08-23 NOTE — Patient Instructions (Addendum)
Your physician wants you to follow-up in: 1 year. You will receive a reminder letter in the mail two months in advance. If you don't receive a letter, please call our office to schedule the follow-up appointment.  

## 2013-11-16 ENCOUNTER — Other Ambulatory Visit (HOSPITAL_COMMUNITY): Payer: Self-pay | Admitting: Internal Medicine

## 2013-11-16 ENCOUNTER — Ambulatory Visit (HOSPITAL_COMMUNITY)
Admission: RE | Admit: 2013-11-16 | Discharge: 2013-11-16 | Disposition: A | Discharge status: Home / Self Care | Payer: Medicare Other | Source: Ambulatory Visit | Attending: Internal Medicine | Admitting: Internal Medicine

## 2013-11-16 DIAGNOSIS — M25552 Pain in left hip: Secondary | ICD-10-CM

## 2013-11-16 DIAGNOSIS — M25559 Pain in unspecified hip: Secondary | ICD-10-CM | POA: Insufficient documentation

## 2014-04-28 ENCOUNTER — Ambulatory Visit (INDEPENDENT_AMBULATORY_CARE_PROVIDER_SITE_OTHER): Payer: Medicare Other | Admitting: Cardiology

## 2014-04-28 ENCOUNTER — Encounter: Payer: Self-pay | Admitting: Cardiology

## 2014-04-28 VITALS — BP 140/76 | HR 76 | Ht 69.0 in | Wt 244.0 lb

## 2014-04-28 DIAGNOSIS — I10 Essential (primary) hypertension: Secondary | ICD-10-CM

## 2014-04-28 DIAGNOSIS — E785 Hyperlipidemia, unspecified: Secondary | ICD-10-CM

## 2014-04-28 DIAGNOSIS — I2510 Atherosclerotic heart disease of native coronary artery without angina pectoris: Secondary | ICD-10-CM

## 2014-04-28 NOTE — Patient Instructions (Signed)
Continue your current therapy  We will get a copy of your lab work from Dr. Willey Blade.

## 2014-04-28 NOTE — Progress Notes (Signed)
Tyler Mejia Date of Birth: 1936-08-31 Medical Record #[id number]  History of Present Illness: Tyler Mejia is seen back today for a followup visit. He had a NSTEMI in March 2013 with DES to the RCA.  He does have moderate residual disease in a marginal branch and in the LCX. He did have refractory symptoms of shortness of breath on exertion. He was evaluated by Dr. Gwenette Greet in pulmonary and PFTs showed mild obstructive defect. He was placed on an inhaler without significant improvement. He has been on Protonix and reports that his acid reflux symptoms are improved.  He did have a stress Myoview study in June 2013 which showed no evidence of ischemia and normal ejection fraction. He does not do any exercise. He is very limited by arthritis. Was placed on steroids for one month without improvement. He does have some chest soreness after eating spicy foods or after eating a heavy meal but not with exertion.  Current Outpatient Prescriptions on File Prior to Visit  Medication Sig Dispense Refill  . amLODipine (NORVASC) 5 MG tablet Take 5 mg by mouth daily.       Marland Kitchen aspirin EC 81 MG tablet Take 81 mg by mouth daily.      . nitroGLYCERIN (NITROSTAT) 0.4 MG SL tablet Place 0.4 mg under the tongue every 5 (five) minutes x 3 doses as needed for chest pain (up to 3 doses).      . pantoprazole (PROTONIX) 40 MG tablet Take 40 mg by mouth daily.       No current facility-administered medications on file prior to visit.    Allergies  Allergen Reactions  . Codeine Other (See Comments)    Does not know     Past Medical History  Diagnosis Date  . GERD (gastroesophageal reflux disease)   . HTN (hypertension)   . Hx of adenomatous colonic polyps   . Kidney stones   . NSTEMI (non-ST elevated myocardial infarction) March 2013    DES to the RCA; has residual disease with a 70-80% 1st marginal and a 60-70% mid LCX. Functional study recommended in about 3 months to reassess  . Obesity   . HLD (hyperlipidemia)    . COPD (chronic obstructive pulmonary disease)   . CAD (coronary artery disease)   . IBS (irritable bowel syndrome)     Past Surgical History  Procedure Laterality Date  . Appendectomy    . Ruptured disc    . Esophagogastroduodenoscopy  2003    distal erosion/small hiatal hernia  . Colonoscopy  08/21/2004    Pedunculated polyp at the splenic flexure  . Colonoscopy  09/2009    Dr. Rourk--> Cecal and hepatic flexure polyps, tubular adenomas. Next colonoscopy October 2013  . Esophagogastroduodenoscopy  09/2009    Dr. Evalee Mutton ring, mild distal esophageal erosions, status post Maloney dilation, small hiatal hernia.  . Back surgery    . Colonoscopy N/A 05/07/2013    Procedure: COLONOSCOPY;  Surgeon: Daneil Dolin, MD;  Location: [facility];  Service: Endoscopy;  Laterality: N/A;  9:15    History  Smoking status  . Former Smoker -- 1.50 packs/day for 45 years  . Types: Cigarettes  . Quit date: 12/09/2001  Smokeless tobacco  . Never Used    Comment: quit about 9 years ago    History  Alcohol Use No    Family History  Problem Relation Age of Onset  . Pneumonia Father     deceased age 10  . Heart disease Mother  deceased age 40  . Breast cancer Sister     2 sisters  . Esophageal cancer Brother   . Diabetes Mother     Review of Systems: The review of systems is per the HPI.  All other systems were reviewed and are negative.  Physical Exam: BP 140/76  Pulse 76  Ht 5\' 9"  (1.753 m)  Wt 244 lb (110.678 kg)  BMI 36.02 kg/m2 Patient is an obese, pleasant white male in no acute distress.  Skin is warm and dry. Color is normal.  HEENT is unremarkable. Normocephalic/atraumatic. PERRL. Sclera are nonicteric. Neck is supple. No masses. No JVD. Lungs are clear. Cardiac exam shows a regular rate and rhythm. Abdomen is obese but soft. Extremities are without edema. Gait and ROM are intact. No gross neurologic deficits noted.  LABORATORY DATA:  ECG today is normal.  Rate is 66 beats per minute Lab Results  Component Value Date   WBC 9.3 03/11/2012   HGB 14.1 03/11/2012   HCT 42.0 03/11/2012   PLT 279.0 03/11/2012   GLUCOSE 95 05/13/2012   CHOL 101 05/13/2012   TRIG 122.0 05/13/2012   HDL 31.00* 05/13/2012   LDLCALC 46 05/13/2012   ALT 20 05/13/2012   AST 26 05/13/2012   NA 142 05/13/2012   K 4.4 05/13/2012   CL 108 05/13/2012   CREATININE 0.9 05/13/2012   BUN 19 05/13/2012   CO2 27 05/13/2012   INR 1.10 02/28/2012     Assessment / Plan: 1. Coronary disease status post non-ST elevation myocardial infarction March of 2013. He had stenting of the right coronary with a long drug-eluting stent. He had moderate disease noted in the obtuse marginal vessels. Followup Myoview study was negative in June 2013.  Continue aspirin indefinitely.  2. Dyspnea. Multifactorial. I think this is primarily related to his morbid obesity and poor conditioning. Recommend regular exercise and weight loss. No change when we stopped metoprolol  3. GERD on Protonix. Still has indigestion sx.  4. Hypertension, controlled.  5. Morbid obesity.  6. Hyperlipidemia intolerant of multiple cholesterol meds. May be a candidate for PCSK9 inhibitor when available.   The patient lives in [place] and would like the convenience of being seen in our office there. Will plan follow up there in 6 months.

## 2014-05-18 ENCOUNTER — Other Ambulatory Visit (HOSPITAL_COMMUNITY): Payer: Self-pay | Admitting: Orthopedic Surgery

## 2014-05-18 DIAGNOSIS — M545 Low back pain, unspecified: Secondary | ICD-10-CM

## 2014-05-24 ENCOUNTER — Ambulatory Visit (HOSPITAL_COMMUNITY)
Admission: RE | Admit: 2014-05-24 | Discharge: 2014-05-24 | Disposition: A | Discharge status: Home / Self Care | Payer: Medicare Other | Source: Ambulatory Visit | Attending: Orthopedic Surgery | Admitting: Orthopedic Surgery

## 2014-05-24 DIAGNOSIS — M48061 Spinal stenosis, lumbar region without neurogenic claudication: Secondary | ICD-10-CM | POA: Insufficient documentation

## 2014-05-24 DIAGNOSIS — M545 Low back pain, unspecified: Secondary | ICD-10-CM

## 2014-05-24 DIAGNOSIS — M51379 Other intervertebral disc degeneration, lumbosacral region without mention of lumbar back pain or lower extremity pain: Secondary | ICD-10-CM | POA: Insufficient documentation

## 2014-05-24 DIAGNOSIS — M5137 Other intervertebral disc degeneration, lumbosacral region: Secondary | ICD-10-CM | POA: Insufficient documentation

## 2014-05-24 DIAGNOSIS — M5126 Other intervertebral disc displacement, lumbar region: Secondary | ICD-10-CM | POA: Insufficient documentation

## 2014-05-24 DIAGNOSIS — M539 Dorsopathy, unspecified: Secondary | ICD-10-CM | POA: Insufficient documentation

## 2014-05-24 DIAGNOSIS — M25559 Pain in unspecified hip: Secondary | ICD-10-CM | POA: Insufficient documentation

## 2014-10-26 ENCOUNTER — Ambulatory Visit (INDEPENDENT_AMBULATORY_CARE_PROVIDER_SITE_OTHER): Payer: Medicare Other | Admitting: Cardiology

## 2014-10-26 ENCOUNTER — Encounter: Payer: Self-pay | Admitting: Cardiology

## 2014-10-26 VITALS — BP 152/82 | Ht 69.0 in | Wt 243.0 lb

## 2014-10-26 DIAGNOSIS — I2510 Atherosclerotic heart disease of native coronary artery without angina pectoris: Secondary | ICD-10-CM

## 2014-10-26 DIAGNOSIS — I10 Essential (primary) hypertension: Secondary | ICD-10-CM

## 2014-10-26 DIAGNOSIS — E785 Hyperlipidemia, unspecified: Secondary | ICD-10-CM

## 2014-10-26 NOTE — Patient Instructions (Signed)
Your physician wants you to follow-up in: [place] DR. Domenic Polite You will receive a reminder letter in the mail two months in advance. If you don't receive a letter, please call our office to schedule the follow-up appointment.  Your physician recommends that you continue on your current medications as directed. Please refer to the Current Medication list given to you today.  WE WILL REQUEST LABS FROM DR Willey Blade  Thank you for choosing [facility]!!

## 2014-10-26 NOTE — Assessment & Plan Note (Signed)
No active angina symptoms. Patient has stable multifactorial dyspnea on exertion. ECG reviewed and nonspecific. Plan to continue medical therapy and observation. We will consider follow-up stress testing around some of his next visit, closer to 3 years out.

## 2014-10-26 NOTE — Assessment & Plan Note (Signed)
Blood pressure is elevated today. We did discuss diet and weight loss. Otherwise continue medical therapy and follow-up with Dr. Willey Blade.

## 2014-10-26 NOTE — Progress Notes (Signed)
Reason for visit: CAD, hyperlipidemia  Clinical Summary Mr. Bertz is a 78 y.o.male former patient of Dr. Debara Pickett and Dr. Martinique, last seen in May 2015. He presents to establish care in the [place] office, this is our first meeting. I reviewed his history, outlined below.  He reports chronic dyspnea on exertion, no change over the last 6 months. He still stays active with yard work and house chores. Lives by himself. He admits that his diet is not very good. He has struggled to lose any weight.  He reports compliance with his medications which are outlined below. He does not have any specific angina symptoms. No nitroglycerin use. Reports having a physical with Dr. Willey Blade back in September. We will request lab work for review.  Last ischemic testing was via Myoview in June 2013 following his NSTEMI and heart catheterization detailed below. This study did not show active ischemia. He has been managed medically. ECG today shows sinus rhythm with nonspecific ST changes.   Allergies  Allergen Reactions  . Codeine Other (See Comments)    Does not know     Current Outpatient Prescriptions  Medication Sig Dispense Refill  . amLODipine (NORVASC) 5 MG tablet Take 5 mg by mouth daily.     Marland Kitchen aspirin EC 81 MG tablet Take 81 mg by mouth daily.    Marland Kitchen loperamide (IMODIUM) 2 MG capsule     . nitroGLYCERIN (NITROSTAT) 0.4 MG SL tablet Place 0.4 mg under the tongue every 5 (five) minutes x 3 doses as needed for chest pain (up to 3 doses).    . pantoprazole (PROTONIX) 40 MG tablet Take 40 mg by mouth daily.    . sildenafil (REVATIO) 20 MG tablet Take 20 mg by mouth as needed.     No current facility-administered medications for this visit.    Past Medical History  Diagnosis Date  . GERD (gastroesophageal reflux disease)   . Essential hypertension   . Hx of adenomatous colonic polyps   . Nephrolithiasis   . NSTEMI (non-ST elevated myocardial infarction) March 2013  . Obesity   . HLD  (hyperlipidemia)   . COPD (chronic obstructive pulmonary disease)   . CAD (coronary artery disease)     DES to the RCA March 2013, residual 70-80% 1st marginal and a 60-70% mid LCX  . IBS (irritable bowel syndrome)     Social History Mr. Mizrahi reports that he quit smoking about 12 years ago. His smoking use included Cigarettes. He has a 67.5 pack-year smoking history. He has never used smokeless tobacco. Mr. Fusselman reports that he does not drink alcohol.  Review of Systems Complete review of systems negative except as otherwise outlined in the clinical summary and also the following.no palpitations or dizziness, no syncope. No reported bleeding problems. No claudication.  Physical Examination Filed Vitals:   10/26/14 1418  BP: 152/82   Filed Weights   10/26/14 1418  Weight: 243 lb (110.224 kg)   Obese male, appears comfortable at rest. HEENT: Conjunctiva and lids normal, oropharynx. Neck: Supple, no elevated JVP or carotid bruits, no thyromegaly. Lungs: Clear to auscultation, nonlabored breathing at rest. Cardiac: Regular rate and rhythm, no S3 or significant systolic murmur, no pericardial rub. Abdomen: Soft, nontender, protuberant, bowel sounds present. Extremities: No pitting edema, distal pulses 2+. Skin: Warm and dry. Musculoskeletal: No kyphosis. Neuropsychiatric: Alert and oriented x3, affect grossly appropriate.   Problem List and Plan   Coronary artery disease involving native coronary artery No active angina symptoms. Patient  has stable multifactorial dyspnea on exertion. ECG reviewed and nonspecific. Plan to continue medical therapy and observation. We will consider follow-up stress testing around some of his next visit, closer to 3 years out.  Essential hypertension Blood pressure is elevated today. We did discuss diet and weight loss. Otherwise continue medical therapy and follow-up with Dr. Willey Blade.  HLD (hyperlipidemia) Reportedly intolerant to statins. Will  request most recent lab work from Dr. Willey Blade.    [name], M.D., F.A.C.C.

## 2014-10-26 NOTE — Assessment & Plan Note (Signed)
Reportedly intolerant to statins. Will request most recent lab work from Dr. Willey Blade.

## 2014-10-27 NOTE — Addendum Note (Signed)
Addended by: Barbarann Ehlers A on: 10/27/2014 09:58 AM   Modules accepted: Orders

## 2014-10-28 ENCOUNTER — Encounter: Payer: Self-pay | Admitting: Cardiology

## 2014-11-17 ENCOUNTER — Encounter (HOSPITAL_COMMUNITY): Payer: Self-pay | Admitting: Cardiology

## 2015-01-04 ENCOUNTER — Ambulatory Visit: Payer: Self-pay | Admitting: Podiatrist

## 2015-02-08 ENCOUNTER — Ambulatory Visit (INDEPENDENT_AMBULATORY_CARE_PROVIDER_SITE_OTHER): Payer: Self-pay | Admitting: Physician Assistant

## 2015-02-08 ENCOUNTER — Encounter: Payer: Self-pay | Admitting: Physician Assistant

## 2015-02-08 ENCOUNTER — Encounter: Payer: Self-pay | Admitting: *Deleted

## 2015-02-08 VITALS — BP 160/90 | HR 73 | Ht 69.0 in | Wt 247.2 lb

## 2015-02-08 DIAGNOSIS — R0602 Shortness of breath: Secondary | ICD-10-CM | POA: Diagnosis not present

## 2015-02-08 DIAGNOSIS — I10 Essential (primary) hypertension: Secondary | ICD-10-CM | POA: Diagnosis not present

## 2015-02-08 DIAGNOSIS — R072 Precordial pain: Secondary | ICD-10-CM

## 2015-02-08 DIAGNOSIS — I209 Angina pectoris, unspecified: Secondary | ICD-10-CM | POA: Diagnosis not present

## 2015-02-08 MED ORDER — ISOSORBIDE MONONITRATE ER 30 MG PO TB24: 30.0000 mg | ORAL_TABLET | Freq: Every day | ORAL | Status: DC

## 2015-02-08 NOTE — Patient Instructions (Addendum)
Your physician recommends that you schedule a follow-up appointment in: 1-2 weeks with Dr. Domenic Polite  Your physician has requested that you have a lexiscan myoview. For further information please visit [email or web address]. Please follow instruction sheet, as given.  Start Imdur 30 mg Daily  You have been give a 2GM Salt Diet   Thank you for choosing [facility]!

## 2015-02-08 NOTE — Assessment & Plan Note (Signed)
Blood pressure is up today. The patient did eat his sausage biscuit right before coming here. Add Imdur. 2 g sodium diet.

## 2015-02-08 NOTE — Assessment & Plan Note (Signed)
Patient has had recent increase in exertional chest pain consistent with angina. It only occurs with heavy exertion. His blood pressure is elevated today. We'll add Imdur  30 mg once daily in order Lexi scan Myoview to rule out ischemia. Follow-up with Dr. Domenic Polite in 1-2 weeks. Asked patient to use nitroglycerin for chest pain and go to the closest emergency room for prolonged pain.

## 2015-02-08 NOTE — Progress Notes (Signed)
Cardiology Office Note   Date:  02/08/2015   ID:  Taitum, Alms 1936-02-28, MRN [id number]  PCP:  [name], MD  Cardiologist: Domenic Polite Chief Complaint: Chest pain    History of Present Illness: Tyler Mejia is a 79 y.o. male who presents for increasing chest pain. He has a history of CAD status post NSTEMI treated with drug-eluting stent to the RCA in March 2013 with residual 70-80% first marginal and 60-70% mid circumflex. He had a follow-up stress Myoview June 2013 that was negative for ischemia. Patient also has history of hypertension, HLD, COPD, obesity. Last seen by Dr. Domenic Polite in 10/2014 with chronic dyspnea on exertion that has unchanged over the past 6 months was felt to be multifactorial.  Patient comes in today referred by Dr. Willey Blade for an increase in angina. Patient says for the past couple weeks he has noticed some chest tightness with heavy exertion. 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 He said it was mild and had no radiation GERD dyspnea with it. He did not take a nitroglycerin. It also occurs if he lifts anything heavy. Occasionally he has had radiation into his right neck and shortness of breath. It usually eases with rest. He has had some left shoulder pain but thinks it's due to arthritis and is associated with the weather. He has chronic dyspnea on exertion and doesn't think he can walk on a treadmill. He has COPD but quit smoking 10-12 years ago.  Patient's blood pressure is elevated today. He did eat his sausage biscuit prior to coming here. He does this frequently.    Past Medical History  Diagnosis Date  . GERD (gastroesophageal reflux disease)   . Essential hypertension   . Hx of adenomatous colonic polyps   . Nephrolithiasis   . NSTEMI (non-ST elevated myocardial infarction) March 2013  . Obesity   . HLD (hyperlipidemia)   . COPD (chronic obstructive pulmonary disease)   . CAD  (coronary artery disease)     DES to the RCA March 2013, residual 70-80% 1st marginal and a 60-70% mid LCX  . IBS (irritable bowel syndrome)     Past Surgical History  Procedure Laterality Date  . Appendectomy    . Esophagogastroduodenoscopy  2003    distal erosion/small hiatal hernia  . Colonoscopy  08/21/2004    Pedunculated polyp at the splenic flexure  . Colonoscopy  09/2009    Dr. Rourk--> Cecal and hepatic flexure polyps, tubular adenomas. Next colonoscopy October 2013  . Esophagogastroduodenoscopy  09/2009    Dr. Evalee Mutton ring, mild distal esophageal erosions, status post Maloney dilation, small hiatal hernia.  . Back surgery    . Colonoscopy N/A 05/07/2013    Procedure: COLONOSCOPY;  Surgeon: Daneil Dolin, MD;  Location: [facility];  Service: Endoscopy;  Laterality: N/A;  9:15  . Left heart catheterization with coronary angiogram N/A 03/02/2012    Procedure: LEFT HEART CATHETERIZATION WITH CORONARY ANGIOGRAM;  Surgeon: Peter M Martinique, MD;  Location: [facility] CATH LAB;  Service: Cardiovascular;  Laterality: N/A;  . Percutaneous coronary stent intervention (pci-s) N/A 03/02/2012    Procedure: PERCUTANEOUS CORONARY STENT INTERVENTION (PCI-S);  Surgeon: Peter M Martinique, MD;  Location: [facility] CATH LAB;  Service: Cardiovascular;  Laterality: N/A;     Current Outpatient Prescriptions  Medication Sig Dispense Refill  . amLODipine (NORVASC) 5 MG tablet Take 5 mg by mouth daily.     Marland Kitchen aspirin EC  81 MG tablet Take 81 mg by mouth daily.    Marland Kitchen loperamide (IMODIUM) 2 MG capsule     . nitroGLYCERIN (NITROSTAT) 0.4 MG SL tablet Place 0.4 mg under the tongue every 5 (five) minutes x 3 doses as needed for chest pain (up to 3 doses).    . pantoprazole (PROTONIX) 40 MG tablet Take 40 mg by mouth daily.    . sildenafil (REVATIO) 20 MG tablet Take 20 mg by mouth as needed.     No current facility-administered medications for this visit.    Allergies:   Codeine    Social History:  The patient   reports that he quit smoking about 13 years ago. His smoking use included Cigarettes. He has a 67.5 pack-year smoking history. He has never used smokeless tobacco. He reports that he does not drink alcohol or use illicit drugs.   Family History:  The patient's family history includes Breast cancer in his sister; Diabetes in his mother; Esophageal cancer in his brother; Heart disease in his mother; Pneumonia in his father.    ROS:  Please see the history of present illness.   Otherwise, review of systems are positive for arthritis, chronic dyspnea on exertion, hard of hearing.   All other systems are reviewed and negative.    PHYSICAL EXAM: VS:  BP 160/90 mmHg  Pulse 73  Ht 5\' 9"  (1.753 m)  Wt 247 lb 3.2 oz (112.129 kg)  BMI 36.49 kg/m2  SpO2 94% , BMI Body mass index is 36.49 kg/(m^2). GEN: Obese, well developed, in no acute distress HEENT: normal Neck: no JVD, HJR, carotid bruits, or masses Cardiac: RRR; no gallop ,murmurs, rubs, thrill or heave,no edema,   Respiratory:  Decreased breath sounds throughout but clear to auscultation bilaterally, normal work of breathing GI: soft, nontender, nondistended, + BS MS: no deformity or atrophy Extremities: without cyanosis, clubbing, edema, good distal pulses bilaterally.  Skin: warm and dry, no rash Neuro:  Strength and sensation are intact Psych: euthymic mood, full affect   EKG:  EKG is not ordered today. The ekg from Dr. Ria Comment office shows normal sinus rhythm with nonspecific ST-T wave changes, no acute change from prior tracing.   Recent Labs: No results found for requested labs within last 365 days.    Lipid Panel    Component Value Date/Time   CHOL 101 05/13/2012 1055   TRIG 122.0 05/13/2012 1055   HDL 31.00* 05/13/2012 1055   CHOLHDL 3 05/13/2012 1055   VLDL 24.4 05/13/2012 1055   LDLCALC 46 05/13/2012 1055      Wt Readings from Last 3 Encounters:  02/08/15 247 lb 3.2 oz (112.129 kg)  10/26/14 243 lb (110.224 kg)   04/28/14 244 lb (110.678 kg)      Other studies Reviewed: Additional studies/ records that were reviewed today include and review of the records demonstrates: Coronary angiography: Coronary dominance: right  Left mainstem: Normal.  Left anterior descending (LAD): There is a 50% stenosis at the LAD diagonal bifurcation. There is 30% narrowing in the mid LAD. The first diagonal is a relatively small branch without significant disease.  Left circumflex (LCx): The left circumflex gives rise to 2 marginal branches. The first marginal branch has a focal 70-80% stenosis in the proximal vessel. The mid left circumflex coronary has a 60-70% stenosis.  Right coronary artery (RCA): The right coronary is a dominant vessel. It has a long segmental stenosis in the distal vessel up to 95%. The mid right coronary was diffusely  diseased up to 20-30%.  Left ventriculography: Left ventricular systolic function is normal, LVEF is estimated at 55-65%, there is no significant mitral regurgitation   PCI Procedure Note:  Following the diagnostic procedure, the decision was made to proceed with PCI. The sheath was upsized to a 6 [place]. Weight-based heparin was given for anticoagulation. IV Integrilin was continued. Plavix 600 mg by mouth was given. Once a therapeutic ACT was achieved, a 6 [place] JR 4 guide catheter was inserted.  A pro-water coronary guidewire was used to cross the lesion.  The lesion was predilated with a 2.0 mm compliant balloon.  The lesion was then stented with a 2.5 x 38 mm Promus element stent.  The stent was postdilated with a 2.75 mm noncompliant balloon.  Following PCI, there was 0% residual stenosis and TIMI-3 flow. Final angiography confirmed an excellent result. Femoral hemostasis was achieved with manual compression.  The patient tolerated the PCI procedure well. There were no immediate procedural complications.  The patient was transferred to the post catheterization recovery area for  further monitoring.  PCI Data: Vessel - RCA/Segment - distal Percent Stenosis (pre)  95% TIMI-flow 2 Stent 2.5 x 38 mm Promus element Percent Stenosis (post) 0% TIMI-flow (post) 3  Final Conclusions:   1. 2 vessel obstructive atherosclerotic coronary disease. The distal right coronary is clearly the culprit vessel with a severe segmental stenosis. There is moderate obstructive disease in the first obtuse marginal vessel and mid circumflex. The LAD has borderline disease at the diagonal bifurcation. 2. Overall good left ventricular function. 3. Successful stenting of the distal right coronary with a drug-eluting stent.  Recommendations: Recommend dual antiplatelet therapy for one year. I would treat his other disease medically at this time but would consider functional study later to evaluate the circumflex and LAD distribution.    ASSESSMENT AND PLAN:  Chest pain Patient has had recent increase in exertional chest pain consistent with angina. It only occurs with heavy exertion. His blood pressure is elevated today. We'll add Imdur  30 mg once daily in order Lexi scan Myoview to rule out ischemia. Follow-up with Dr. Domenic Polite in 1-2 weeks. Asked patient to use nitroglycerin for chest pain and go to the closest emergency room for prolonged pain.   Shortness of breath Patient has chronic dyspnea on exertion related to his COPD. This has not changed.   Essential hypertension Blood pressure is up today. The patient did eat his sausage biscuit right before coming here. Add Imdur. 2 g sodium diet.    Sumner Boast, PA-C  02/08/2015 11:41 AM    [facility] [place], [place],   [address] Phone: [phone]; Fax: [phone]

## 2015-02-08 NOTE — Assessment & Plan Note (Signed)
Patient has chronic dyspnea on exertion related to his COPD. This has not changed.

## 2015-02-13 ENCOUNTER — Encounter: Payer: Self-pay | Admitting: Physician Assistant

## 2015-02-15 ENCOUNTER — Encounter (HOSPITAL_COMMUNITY)
Admission: RE | Admit: 2015-02-15 | Discharge: 2015-02-15 | Disposition: A | Discharge status: Home / Self Care | Payer: Medicare Other | Source: Ambulatory Visit | Attending: Physician Assistant | Admitting: Physician Assistant

## 2015-02-15 ENCOUNTER — Encounter (HOSPITAL_COMMUNITY): Payer: Self-pay

## 2015-02-15 ENCOUNTER — Ambulatory Visit (HOSPITAL_COMMUNITY)
Admission: RE | Admit: 2015-02-15 | Discharge: 2015-02-15 | Disposition: A | Discharge status: Home / Self Care | Payer: Medicare Other | Source: Ambulatory Visit | Attending: Physician Assistant | Admitting: Physician Assistant

## 2015-02-15 DIAGNOSIS — R072 Precordial pain: Secondary | ICD-10-CM | POA: Diagnosis present

## 2015-02-15 DIAGNOSIS — R079 Chest pain, unspecified: Secondary | ICD-10-CM | POA: Insufficient documentation

## 2015-02-15 DIAGNOSIS — R0600 Dyspnea, unspecified: Secondary | ICD-10-CM | POA: Diagnosis not present

## 2015-02-15 DIAGNOSIS — I2510 Atherosclerotic heart disease of native coronary artery without angina pectoris: Secondary | ICD-10-CM | POA: Insufficient documentation

## 2015-02-15 MED ORDER — REGADENOSON 0.4 MG/5ML IV SOLN
INTRAVENOUS | Status: AC
  Administered 2015-02-15: 0.4 mg via INTRAVENOUS
  Filled 2015-02-15: qty 5

## 2015-02-15 MED ORDER — REGADENOSON 0.4 MG/5ML IV SOLN
0.4000 mg | Freq: Once | INTRAVENOUS | Status: AC | PRN
  Administered 2015-02-15: 0.4 mg via INTRAVENOUS

## 2015-02-15 MED ORDER — SODIUM CHLORIDE 0.9 % IJ SOLN
10.0000 mL | INTRAMUSCULAR | Status: DC | PRN
  Filled 2015-02-15: qty 10

## 2015-02-15 MED ORDER — SODIUM CHLORIDE 0.9 % IJ SOLN
INTRAMUSCULAR | Status: AC
  Administered 2015-02-15: 10 mL via INTRAVENOUS
  Filled 2015-02-15: qty 3

## 2015-02-15 MED ORDER — TECHNETIUM TC 99M SESTAMIBI GENERIC - CARDIOLITE
10.0000 | Freq: Once | INTRAVENOUS | Status: AC | PRN
Start: 2015-02-15 — End: 2015-02-15
  Administered 2015-02-15: 10 via INTRAVENOUS

## 2015-02-15 MED ORDER — TECHNETIUM TC 99M SESTAMIBI - CARDIOLITE
30.0000 | Freq: Once | INTRAVENOUS | Status: AC | PRN
  Administered 2015-02-15: 30 via INTRAVENOUS

## 2015-02-15 NOTE — Progress Notes (Signed)
Stress Lab Nurses Notes - Forestine Na  TRAVONTA GILL 02/15/2015 Reason for doing test: CAD, Chest Pain and Dyspnea Type of test: Wille Glaser Nurse performing test: Gerrit Halls, RN Nuclear Medicine Tech: Redmond Baseman Echo Tech: Not Applicable MD performing test: S. McDowell/M.Bonnell Public PA Family MD: Willey Blade Test explained and consent signed: Yes.   IV started: Saline lock flushed, No redness or edema and Saline lock started in radiology Symptoms: SOB Treatment/Intervention: None Reason test stopped: protocol completed After recovery IV was: Discontinued via X-ray tech and No redness or edema Patient to return to Nuc. Med at : 11:45 Patient discharged: Home Patient's Condition upon discharge was: stable Comments: During test BP 158/76 & HR 84.  Recovery BP 153/81 & HR 78.  Symptoms resolved in recovery. Geanie Cooley T

## 2015-02-23 ENCOUNTER — Encounter: Payer: Self-pay | Admitting: Internal Medicine

## 2015-03-02 ENCOUNTER — Ambulatory Visit: Payer: Medicare Other | Admitting: Cardiology

## 2015-03-14 ENCOUNTER — Telehealth: Payer: Self-pay | Admitting: Internal Medicine

## 2015-03-14 NOTE — Telephone Encounter (Signed)
Pt has ov tomorrow.  

## 2015-03-14 NOTE — Telephone Encounter (Signed)
PATIENT CAME IN OFFICE ASKING TO BE SEEN SOONER BECAUSE HE IS HAVING BAD CHEST PAIN WHEN HE BREATHES.  I PUT HIM ON A CANCEL LIST BUT CAN YOU PLEASE CALL HIM AT [phone]

## 2015-03-15 ENCOUNTER — Encounter: Payer: Self-pay | Admitting: Nurse Practitioner

## 2015-03-15 ENCOUNTER — Ambulatory Visit (INDEPENDENT_AMBULATORY_CARE_PROVIDER_SITE_OTHER): Payer: Medicare Other | Admitting: Nurse Practitioner

## 2015-03-15 ENCOUNTER — Other Ambulatory Visit: Payer: Self-pay

## 2015-03-15 VITALS — BP 157/79 | HR 81 | Temp 98.1°F | Ht 66.0 in | Wt 243.2 lb

## 2015-03-15 DIAGNOSIS — I209 Angina pectoris, unspecified: Secondary | ICD-10-CM | POA: Diagnosis not present

## 2015-03-15 DIAGNOSIS — R1314 Dysphagia, pharyngoesophageal phase: Secondary | ICD-10-CM

## 2015-03-15 DIAGNOSIS — Q394 Esophageal web: Secondary | ICD-10-CM

## 2015-03-15 DIAGNOSIS — R1310 Dysphagia, unspecified: Secondary | ICD-10-CM

## 2015-03-15 DIAGNOSIS — K222 Esophageal obstruction: Secondary | ICD-10-CM

## 2015-03-15 DIAGNOSIS — R1013 Epigastric pain: Secondary | ICD-10-CM

## 2015-03-15 NOTE — Patient Instructions (Signed)
1.  We will schedule your procedure (endoscopy) for you. 2.  further recommendations to be based on results your procedure.

## 2015-03-15 NOTE — Progress Notes (Signed)
cc'ed to pcp °

## 2015-03-15 NOTE — Assessment & Plan Note (Signed)
Patient having symptoms of dysphagia with solid foods and sometimes liquids. Denies regurgitation. States that he waits a couple minutes usually the food item will pass.  Has history of Schatzki's ring as well as GERD. Is taking his Protonix 40 mg every day. Possible age-related dysmotility, however giving his history cannot rule out possibility of a stricture web or ring. This could also be explanatory of his chest pain which has been ruled out for cardiac etiology by cardiology. We'll proceed with upper endoscopy for further evaluation.  Proceed with EGD with Dr. Gala Romney in near future: the risks, benefits, and alternatives have been discussed with the patient in detail. The patient states understanding and desires to proceed.

## 2015-03-15 NOTE — Assessment & Plan Note (Signed)
79 year old male with chest pain since the beginning of the year with a history of NSTEMI there has been evaluated by cardiology including stress test which was negative. Felt to be gastrointestinal in nature. Has a history of a Schatzki's ring status post dilation in 2010. Pain is located in his epigastric and chest region described as a cramping pain which radiates up into his throat and causing shortness of breath with exertion. On his last endoscopy has a history of Schatzki's ring as well as a hiatal hernia. Also of note he states his brother had similar symptoms before having an evaluation and found to have esophageal cancer and subsequently died 3 weeks later which concerns him.  Likely etiology includes Schatzki's ring, esophagitis, or esophageal spasm.  We'll proceed with a repeat endoscopy for further evaluation.  Proceed with EGD with Dr. Gala Romney in near future: the risks, benefits, and alternatives have been discussed with the patient in detail. The patient states understanding and desires to proceed.

## 2015-03-15 NOTE — Progress Notes (Signed)
Referring Provider: [name], MD Primary Care Physician:  [name], MD Primary GI:  Dr. Gala Romney  Chief Complaint  Patient presents with  . Dysphagia    HPI:    79 year old male presents for chest pain after being workup and ruled out for cardiac etiology by cardiology, and history of Schatzki's ring   Reviewed visit notes as well as stress test results from cardiology as well as his PCP. Pain has occurred since about 12/09/14. As well as family history of esophageal cancer (brother). He had a myocardial perfusion scan last month which was negative. At that point his PCP referred him to GI for further evaluation for possible esophageal etiology.  Last endoscopy in 2010 by Dr. Gala Romney which found a Schatzki's ring status post dilation, mild erosive reflux esophagitis, small hiatal hernia. Last colonoscopy  05/07/2013 which found a adequate preparation, normal rectum, scattered left-sided diverticula , a single 3 mm polyp in the  Mid-descending segment, otherwise the remainder of the colonic mucosa appeared normal.  Recommended taking Protonix 40 mg every day without fail regardless of GERD symptoms given history of Schatzki's ring. Pathology on his polyp showed  Tubular adenoma, no high-grade dysplasia or metaplasia. Recommend repeat surveillance colonoscopy in 5 years (2019).    today he states when he gets exerted he'll have chest pain which is a cramping pain that radiates up into his throat which causes shortness of breath. No association with eating. States his brother had similar symptoms before passing away from esophageal cancer. Takes Protonix daily. His GERD symptoms are well controlled. Is having solid food and liquid dysphagia. Denies pill dysphagia. Denies regurgitation states if he waits a few minutes it'll eventually work it's way down. Baseline abdominal cramping and diarrhea. Denies fever, chills, unintentional weight loss, change in appetite, or change in bowel habits. Denies any other  upper or lower GI symptoms.   Past Medical History  Diagnosis Date  . GERD (gastroesophageal reflux disease)   . Essential hypertension   . Hx of adenomatous colonic polyps   . Nephrolithiasis   . NSTEMI (non-ST elevated myocardial infarction) March 2013  . Obesity   . HLD (hyperlipidemia)   . COPD (chronic obstructive pulmonary disease)   . CAD (coronary artery disease)     DES to the RCA March 2013, residual 70-80% 1st marginal and a 60-70% mid LCX  . IBS (irritable bowel syndrome)     Past Surgical History  Procedure Laterality Date  . Appendectomy    . Esophagogastroduodenoscopy  2003    distal erosion/small hiatal hernia  . Colonoscopy  08/21/2004    Pedunculated polyp at the splenic flexure  . Colonoscopy  09/2009    Dr. Rourk--> Cecal and hepatic flexure polyps, tubular adenomas. Next colonoscopy October 2013  . Esophagogastroduodenoscopy  09/2009    Dr. Evalee Mutton ring, mild distal esophageal erosions, status post Maloney dilation, small hiatal hernia.  . Back surgery    . Colonoscopy N/A 05/07/2013    Procedure: COLONOSCOPY;  Surgeon: Daneil Dolin, MD;  Location: [facility];  Service: Endoscopy;  Laterality: N/A;  9:15  . Left heart catheterization with coronary angiogram N/A 03/02/2012    Procedure: LEFT HEART CATHETERIZATION WITH CORONARY ANGIOGRAM;  Surgeon: Peter M Martinique, MD;  Location: [facility] CATH LAB;  Service: Cardiovascular;  Laterality: N/A;  . Percutaneous coronary stent intervention (pci-s) N/A 03/02/2012    Procedure: PERCUTANEOUS CORONARY STENT INTERVENTION (PCI-S);  Surgeon: Peter M Martinique, MD;  Location: [facility] CATH LAB;  Service: Cardiovascular;  Laterality: N/A;    Current Outpatient Prescriptions  Medication Sig Dispense Refill  . amLODipine (NORVASC) 5 MG tablet Take 5 mg by mouth daily.     Marland Kitchen aspirin EC 81 MG tablet Take 81 mg by mouth daily.    . isosorbide mononitrate (IMDUR) 30 MG 24 hr tablet Take 1 tablet (30 mg total) by mouth daily. 90  tablet 3  . loperamide (IMODIUM) 2 MG capsule     . nitroGLYCERIN (NITROSTAT) 0.4 MG SL tablet Place 0.4 mg under the tongue every 5 (five) minutes x 3 doses as needed for chest pain (up to 3 doses).    . pantoprazole (PROTONIX) 40 MG tablet Take 40 mg by mouth daily.    Marland Kitchen UNABLE TO FIND OMEGA XL 300 mg TID    . sildenafil (REVATIO) 20 MG tablet Take 20 mg by mouth as needed.     No current facility-administered medications for this visit.    Allergies as of 03/15/2015 - Review Complete 03/15/2015  Allergen Reaction Noted  . Codeine Other (See Comments)     Family History  Problem Relation Age of Onset  . Pneumonia Father     Deceased age 69  . Heart disease Mother     Deceased age 39  . Breast cancer Sister     2 sisters  . Esophageal cancer Brother   . Diabetes Mother     History   Social History  . Marital Status: Divorced    Spouse Name: N/A  . Number of Children: N/A  . Years of Education: N/A   Occupational History  . retired Hydrologist    Social History Main Topics  . Smoking status: Former Smoker -- 1.50 packs/day for 45 years    Types: Cigarettes    Quit date: 12/09/2001  . Smokeless tobacco: Never Used     Comment: quit about 9 years ago  . Alcohol Use: No  . Drug Use: No  . Sexual Activity: Not Currently   Other Topics Concern  . None   Social History Narrative    Review of Systems: Gen: Denies fever, chills, anorexia. Denies fatigue, weakness, weight loss.  CV: Admits chest pain,denies  palpitations, syncope, peripheral edema. Resp: Denies dyspnea at rest, cough, wheezing, coughing up blood. GI: See HPI. Denies vomiting blood, jaundice, and fecal incontinence.. Derm: Denies rash, itching, dry skin Psych: Denies depression, anxiety, memory loss, confusion.  Heme: Denies bruising, bleeding, and enlarged lymph nodes.  Physical Exam: BP 157/79 mmHg  Pulse 81  Temp(Src) 98.1 F (36.7 C) (Oral)  Ht 5\' 6"  (1.676 m)  Wt 243 lb 3.2 oz  (110.315 kg)  BMI 39.27 kg/m2 General:   Alert and oriented. No distress noted. Pleasant and cooperative.  Head:  Normocephalic and atraumatic. Eyes:  Conjuctiva clear without scleral icterus. Mouth:  Oral mucosa pink and moist. No lesions. No OP edema. Lungs:  Clear to auscultation bilaterally. No wheezes, rales, or rhonchi. No distress.  Heart:  S1, S2 present without murmurs, rubs, or gallops. Regular rate and rhythm. Abdomen:  +BS, round, soft, non-tender and non-distended. No rebound or guarding. No HSM or masses noted. Msk:  Symmetrical without gross deformities. Normal posture. No pain reproducable to chest wall palpation. Pulses:  2+ DP noted bilaterally Extremities:  Without edema. Neurologic:  Alert and  oriented x4;  grossly normal neurologically. Skin:  Intact without significant lesions or rashes. Psych:  Alert and cooperative. Normal mood and affect.    03/15/2015 8:39 AM

## 2015-03-15 NOTE — Assessment & Plan Note (Signed)
Patient presents with about a four-month history of chest pain and shortness of breath was ruled out for cardiac etiology by cardiology. Does have a history of esophageal Schatzki's ring which was last dilated in 2010. Patient states she is also having some issues with solid food and liquid dysphagia. Denies pill dysphagia. Denies any other red flag/warning symptoms such as unintentional weight loss, fever or chills, intractable nausea and vomiting, and others. Given his symptoms including chest pain and dysphagia as well as his history of a Schatzki's ring and family history of esophageal cancer we'll proceed with an EGD to further evaluate.  Proceed with EGD with Dr. Gala Romney in near future: the risks, benefits, and alternatives have been discussed with the patient in detail. The patient states understanding and desires to proceed.

## 2015-03-16 ENCOUNTER — Encounter (HOSPITAL_COMMUNITY)
Admission: RE | Disposition: A | Discharge status: Home / Self Care | Payer: Self-pay | Source: Ambulatory Visit | Attending: Internal Medicine

## 2015-03-16 ENCOUNTER — Encounter (HOSPITAL_COMMUNITY): Payer: Self-pay | Admitting: *Deleted

## 2015-03-16 ENCOUNTER — Ambulatory Visit (HOSPITAL_COMMUNITY)
Admission: RE | Admit: 2015-03-16 | Discharge: 2015-03-16 | Disposition: A | Discharge status: Home / Self Care | Payer: Medicare Other | Source: Ambulatory Visit | Attending: Internal Medicine | Admitting: Internal Medicine

## 2015-03-16 DIAGNOSIS — J449 Chronic obstructive pulmonary disease, unspecified: Secondary | ICD-10-CM | POA: Diagnosis not present

## 2015-03-16 DIAGNOSIS — R1319 Other dysphagia: Secondary | ICD-10-CM | POA: Insufficient documentation

## 2015-03-16 DIAGNOSIS — E669 Obesity, unspecified: Secondary | ICD-10-CM | POA: Diagnosis not present

## 2015-03-16 DIAGNOSIS — R1314 Dysphagia, pharyngoesophageal phase: Secondary | ICD-10-CM

## 2015-03-16 DIAGNOSIS — I10 Essential (primary) hypertension: Secondary | ICD-10-CM | POA: Diagnosis not present

## 2015-03-16 DIAGNOSIS — I252 Old myocardial infarction: Secondary | ICD-10-CM | POA: Insufficient documentation

## 2015-03-16 DIAGNOSIS — Z7982 Long term (current) use of aspirin: Secondary | ICD-10-CM | POA: Insufficient documentation

## 2015-03-16 DIAGNOSIS — Z800 Family history of malignant neoplasm of digestive organs: Secondary | ICD-10-CM | POA: Diagnosis not present

## 2015-03-16 DIAGNOSIS — Z79899 Other long term (current) drug therapy: Secondary | ICD-10-CM | POA: Diagnosis not present

## 2015-03-16 DIAGNOSIS — K2289 Other specified disease of esophagus: Secondary | ICD-10-CM | POA: Insufficient documentation

## 2015-03-16 DIAGNOSIS — K449 Diaphragmatic hernia without obstruction or gangrene: Secondary | ICD-10-CM | POA: Diagnosis not present

## 2015-03-16 DIAGNOSIS — I2510 Atherosclerotic heart disease of native coronary artery without angina pectoris: Secondary | ICD-10-CM | POA: Insufficient documentation

## 2015-03-16 DIAGNOSIS — K219 Gastro-esophageal reflux disease without esophagitis: Secondary | ICD-10-CM | POA: Diagnosis not present

## 2015-03-16 DIAGNOSIS — K229 Disease of esophagus, unspecified: Secondary | ICD-10-CM

## 2015-03-16 DIAGNOSIS — K222 Esophageal obstruction: Secondary | ICD-10-CM

## 2015-03-16 DIAGNOSIS — E785 Hyperlipidemia, unspecified: Secondary | ICD-10-CM | POA: Diagnosis not present

## 2015-03-16 DIAGNOSIS — Z87891 Personal history of nicotine dependence: Secondary | ICD-10-CM | POA: Diagnosis not present

## 2015-03-16 DIAGNOSIS — Z6835 Body mass index (BMI) 35.0-35.9, adult: Secondary | ICD-10-CM | POA: Diagnosis not present

## 2015-03-16 DIAGNOSIS — R1310 Dysphagia, unspecified: Secondary | ICD-10-CM

## 2015-03-16 DIAGNOSIS — R1013 Epigastric pain: Secondary | ICD-10-CM

## 2015-03-16 HISTORY — PX: ESOPHAGOGASTRODUODENOSCOPY: SHX5428

## 2015-03-16 SURGERY — EGD (ESOPHAGOGASTRODUODENOSCOPY)
Anesthesia: Moderate Sedation

## 2015-03-16 MED ORDER — LIDOCAINE VISCOUS 2 % MT SOLN
OROMUCOSAL | Status: DC | PRN
  Administered 2015-03-16: 4 mL via OROMUCOSAL

## 2015-03-16 MED ORDER — ONDANSETRON HCL 4 MG/2ML IJ SOLN
INTRAMUSCULAR | Status: AC
  Filled 2015-03-16: qty 2

## 2015-03-16 MED ORDER — MEPERIDINE HCL 100 MG/ML IJ SOLN
INTRAMUSCULAR | Status: AC
  Filled 2015-03-16: qty 2

## 2015-03-16 MED ORDER — SODIUM CHLORIDE 0.9 % IV SOLN
INTRAVENOUS | Status: DC
  Administered 2015-03-16: 1000 mL via INTRAVENOUS

## 2015-03-16 MED ORDER — MIDAZOLAM HCL 5 MG/5ML IJ SOLN
INTRAMUSCULAR | Status: AC
  Filled 2015-03-16: qty 10

## 2015-03-16 MED ORDER — MEPERIDINE HCL 100 MG/ML IJ SOLN
INTRAMUSCULAR | Status: DC | PRN
  Administered 2015-03-16: 25 mg via INTRAVENOUS
  Administered 2015-03-16: 50 mg via INTRAVENOUS

## 2015-03-16 MED ORDER — STERILE WATER FOR IRRIGATION IR SOLN
Status: DC | PRN
  Administered 2015-03-16: 15:00:00

## 2015-03-16 MED ORDER — ONDANSETRON HCL 4 MG/2ML IJ SOLN
INTRAMUSCULAR | Status: DC | PRN
  Administered 2015-03-16: 4 mg via INTRAVENOUS

## 2015-03-16 MED ORDER — LIDOCAINE VISCOUS 2 % MT SOLN
OROMUCOSAL | Status: AC
  Filled 2015-03-16: qty 15

## 2015-03-16 MED ORDER — MIDAZOLAM HCL 5 MG/5ML IJ SOLN
INTRAMUSCULAR | Status: DC | PRN
Start: 2015-03-16 — End: 2015-03-16
  Administered 2015-03-16 (×2): 1 mg via INTRAVENOUS
  Administered 2015-03-16: 2 mg via INTRAVENOUS

## 2015-03-16 NOTE — H&P (View-Only) (Signed)
Referring Provider: [name], MD Primary Care Physician:  [name], MD Primary GI:  Dr. Gala Romney  Chief Complaint  Patient presents with  . Dysphagia    HPI:    79 year old male presents for chest pain after being workup and ruled out for cardiac etiology by cardiology, and history of Schatzki's ring   Reviewed visit notes as well as stress test results from cardiology as well as his PCP. Pain has occurred since about 12/09/14. As well as family history of esophageal cancer (brother). He had a myocardial perfusion scan last month which was negative. At that point his PCP referred him to GI for further evaluation for possible esophageal etiology.  Last endoscopy in 2010 by Dr. Gala Romney which found a Schatzki's ring status post dilation, mild erosive reflux esophagitis, small hiatal hernia. Last colonoscopy  05/07/2013 which found a adequate preparation, normal rectum, scattered left-sided diverticula , a single 3 mm polyp in the  Mid-descending segment, otherwise the remainder of the colonic mucosa appeared normal.  Recommended taking Protonix 40 mg every day without fail regardless of GERD symptoms given history of Schatzki's ring. Pathology on his polyp showed  Tubular adenoma, no high-grade dysplasia or metaplasia. Recommend repeat surveillance colonoscopy in 5 years (2019).    today he states when he gets exerted he'll have chest pain which is a cramping pain that radiates up into his throat which causes shortness of breath. No association with eating. States his brother had similar symptoms before passing away from esophageal cancer. Takes Protonix daily. His GERD symptoms are well controlled. Is having solid food and liquid dysphagia. Denies pill dysphagia. Denies regurgitation states if he waits a few minutes it'll eventually work it's way down. Baseline abdominal cramping and diarrhea. Denies fever, chills, unintentional weight loss, change in appetite, or change in bowel habits. Denies any other  upper or lower GI symptoms.   Past Medical History  Diagnosis Date  . GERD (gastroesophageal reflux disease)   . Essential hypertension   . Hx of adenomatous colonic polyps   . Nephrolithiasis   . NSTEMI (non-ST elevated myocardial infarction) March 2013  . Obesity   . HLD (hyperlipidemia)   . COPD (chronic obstructive pulmonary disease)   . CAD (coronary artery disease)     DES to the RCA March 2013, residual 70-80% 1st marginal and a 60-70% mid LCX  . IBS (irritable bowel syndrome)     Past Surgical History  Procedure Laterality Date  . Appendectomy    . Esophagogastroduodenoscopy  2003    distal erosion/small hiatal hernia  . Colonoscopy  08/21/2004    Pedunculated polyp at the splenic flexure  . Colonoscopy  09/2009    Dr. Rourk--> Cecal and hepatic flexure polyps, tubular adenomas. Next colonoscopy October 2013  . Esophagogastroduodenoscopy  09/2009    Dr. Evalee Mutton ring, mild distal esophageal erosions, status post Maloney dilation, small hiatal hernia.  . Back surgery    . Colonoscopy N/A 05/07/2013    Procedure: COLONOSCOPY;  Surgeon: Daneil Dolin, MD;  Location: [facility];  Service: Endoscopy;  Laterality: N/A;  9:15  . Left heart catheterization with coronary angiogram N/A 03/02/2012    Procedure: LEFT HEART CATHETERIZATION WITH CORONARY ANGIOGRAM;  Surgeon: Peter M Martinique, MD;  Location: [facility] CATH LAB;  Service: Cardiovascular;  Laterality: N/A;  . Percutaneous coronary stent intervention (pci-s) N/A 03/02/2012    Procedure: PERCUTANEOUS CORONARY STENT INTERVENTION (PCI-S);  Surgeon: Peter M Martinique, MD;  Location: [facility] CATH LAB;  Service: Cardiovascular;  Laterality: N/A;    Current Outpatient Prescriptions  Medication Sig Dispense Refill  . amLODipine (NORVASC) 5 MG tablet Take 5 mg by mouth daily.     Marland Kitchen aspirin EC 81 MG tablet Take 81 mg by mouth daily.    . isosorbide mononitrate (IMDUR) 30 MG 24 hr tablet Take 1 tablet (30 mg total) by mouth daily. 90  tablet 3  . loperamide (IMODIUM) 2 MG capsule     . nitroGLYCERIN (NITROSTAT) 0.4 MG SL tablet Place 0.4 mg under the tongue every 5 (five) minutes x 3 doses as needed for chest pain (up to 3 doses).    . pantoprazole (PROTONIX) 40 MG tablet Take 40 mg by mouth daily.    Marland Kitchen UNABLE TO FIND OMEGA XL 300 mg TID    . sildenafil (REVATIO) 20 MG tablet Take 20 mg by mouth as needed.     No current facility-administered medications for this visit.    Allergies as of 03/15/2015 - Review Complete 03/15/2015  Allergen Reaction Noted  . Codeine Other (See Comments)     Family History  Problem Relation Age of Onset  . Pneumonia Father     Deceased age 57  . Heart disease Mother     Deceased age 52  . Breast cancer Sister     2 sisters  . Esophageal cancer Brother   . Diabetes Mother     History   Social History  . Marital Status: Divorced    Spouse Name: N/A  . Number of Children: N/A  . Years of Education: N/A   Occupational History  . retired Hydrologist    Social History Main Topics  . Smoking status: Former Smoker -- 1.50 packs/day for 45 years    Types: Cigarettes    Quit date: 12/09/2001  . Smokeless tobacco: Never Used     Comment: quit about 9 years ago  . Alcohol Use: No  . Drug Use: No  . Sexual Activity: Not Currently   Other Topics Concern  . None   Social History Narrative    Review of Systems: Gen: Denies fever, chills, anorexia. Denies fatigue, weakness, weight loss.  CV: Admits chest pain,denies  palpitations, syncope, peripheral edema. Resp: Denies dyspnea at rest, cough, wheezing, coughing up blood. GI: See HPI. Denies vomiting blood, jaundice, and fecal incontinence.. Derm: Denies rash, itching, dry skin Psych: Denies depression, anxiety, memory loss, confusion.  Heme: Denies bruising, bleeding, and enlarged lymph nodes.  Physical Exam: BP 157/79 mmHg  Pulse 81  Temp(Src) 98.1 F (36.7 C) (Oral)  Ht 5\' 6"  (1.676 m)  Wt 243 lb 3.2 oz  (110.315 kg)  BMI 39.27 kg/m2 General:   Alert and oriented. No distress noted. Pleasant and cooperative.  Head:  Normocephalic and atraumatic. Eyes:  Conjuctiva clear without scleral icterus. Mouth:  Oral mucosa pink and moist. No lesions. No OP edema. Lungs:  Clear to auscultation bilaterally. No wheezes, rales, or rhonchi. No distress.  Heart:  S1, S2 present without murmurs, rubs, or gallops. Regular rate and rhythm. Abdomen:  +BS, round, soft, non-tender and non-distended. No rebound or guarding. No HSM or masses noted. Msk:  Symmetrical without gross deformities. Normal posture. No pain reproducable to chest wall palpation. Pulses:  2+ DP noted bilaterally Extremities:  Without edema. Neurologic:  Alert and  oriented x4;  grossly normal neurologically. Skin:  Intact without significant lesions or rashes. Psych:  Alert and cooperative. Normal mood and affect.    03/15/2015 8:39 AM

## 2015-03-16 NOTE — Discharge Instructions (Signed)
EGD Discharge instructions Please read the instructions outlined below and refer to this sheet in the next few weeks. These discharge instructions provide you with general information on caring for yourself after you leave the hospital. Your doctor may also give you specific instructions. While your treatment has been planned according to the most current medical practices available, unavoidable complications occasionally occur. If you have any problems or questions after discharge, please call your doctor. ACTIVITY  You may resume your regular activity but move at a slower pace for the next 24 hours.   Take frequent rest periods for the next 24 hours.   Walking will help expel (get rid of) the air and reduce the bloated feeling in your abdomen.   No driving for 24 hours (because of the anesthesia (medicine) used during the test).   You may shower.   Do not sign any important legal documents or operate any machinery for 24 hours (because of the anesthesia used during the test).  NUTRITION  Drink plenty of fluids.   You may resume your normal diet.   Begin with a light meal and progress to your normal diet.   Avoid alcoholic beverages for 24 hours or as instructed by your caregiver.  MEDICATIONS  You may resume your normal medications unless your caregiver tells you otherwise.  WHAT YOU CAN EXPECT TODAY  You may experience abdominal discomfort such as a feeling of fullness or gas pains.  FOLLOW-UP  Your doctor will discuss the results of your test with you.  SEEK IMMEDIATE MEDICAL ATTENTION IF ANY OF THE FOLLOWING OCCUR:  Excessive nausea (feeling sick to your stomach) and/or vomiting.   Severe abdominal pain and distention (swelling).   Trouble swallowing.   Temperature over 101 F (37.8 C).   Rectal bleeding or vomiting of blood.    GERD information provided  Stop Protonix; begin Dexilant 60 mg daily. Go by my office for 3 week supply of free  Samples  Further  recommendations to follow pending review of pathology report  You may need further evaluation of your esophagus by an endoscopic ultrasound test in the near future. Gastroesophageal Reflux Disease, Adult Gastroesophageal reflux disease (GERD) happens when acid from your stomach flows up into the esophagus. When acid comes in contact with the esophagus, the acid causes soreness (inflammation) in the esophagus. Over time, GERD may create small holes (ulcers) in the lining of the esophagus. CAUSES   Increased body weight. This puts pressure on the stomach, making acid rise from the stomach into the esophagus.  Smoking. This increases acid production in the stomach.  Drinking alcohol. This causes decreased pressure in the lower esophageal sphincter (valve or ring of muscle between the esophagus and stomach), allowing acid from the stomach into the esophagus.  Late evening meals and a full stomach. This increases pressure and acid production in the stomach.  A malformed lower esophageal sphincter. Sometimes, no cause is found. SYMPTOMS   Burning pain in the lower part of the mid-chest behind the breastbone and in the mid-stomach area. This may occur twice a week or more often.  Trouble swallowing.  Sore throat.  Dry cough.  Asthma-like symptoms including chest tightness, shortness of breath, or wheezing. DIAGNOSIS  Your caregiver may be able to diagnose GERD based on your symptoms. In some cases, X-rays and other tests may be done to check for complications or to check the condition of your stomach and esophagus. TREATMENT  Your caregiver may recommend over-the-counter or prescription medicines to  help decrease acid production. Ask your caregiver before starting or adding any new medicines.  HOME CARE INSTRUCTIONS   Change the factors that you can control. Ask your caregiver for guidance concerning weight loss, quitting smoking, and alcohol consumption.  Avoid foods and drinks that  make your symptoms worse, such as:  Caffeine or alcoholic drinks.  Chocolate.  Peppermint or mint flavorings.  Garlic and onions.  Spicy foods.  Citrus fruits, such as oranges, lemons, or limes.  Tomato-based foods such as sauce, chili, salsa, and pizza.  Fried and fatty foods.  Avoid lying down for the 3 hours prior to your bedtime or prior to taking a nap.  Eat small, frequent meals instead of large meals.  Wear loose-fitting clothing. Do not wear anything tight around your waist that causes pressure on your stomach.  Raise the head of your bed 6 to 8 inches with wood blocks to help you sleep. Extra pillows will not help.  Only take over-the-counter or prescription medicines for pain, discomfort, or fever as directed by your caregiver.  Do not take aspirin, ibuprofen, or other nonsteroidal anti-inflammatory drugs (NSAIDs). SEEK IMMEDIATE MEDICAL CARE IF:   You have pain in your arms, neck, jaw, teeth, or back.  Your pain increases or changes in intensity or duration.  You develop nausea, vomiting, or sweating (diaphoresis).  You develop shortness of breath, or you faint.  Your vomit is green, yellow, black, or looks like coffee grounds or blood.  Your stool is red, bloody, or black. These symptoms could be signs of other problems, such as heart disease, gastric bleeding, or esophageal bleeding. MAKE SURE YOU:   Understand these instructions.  Will watch your condition.  Will get help right away if you are not doing well or get worse. Document Released: 09/04/2005 Document Revised: 02/17/2012 Document Reviewed: 06/14/2011 [facility] Patient Information 2015 [place], [place]. This information is not intended to replace advice given to you by your health care provider. Make sure you discuss any questions you have with your health care provider.

## 2015-03-16 NOTE — Interval H&P Note (Signed)
History and Physical Interval Note:  03/16/2015 3:24 PM  Tyler Mejia  has presented today for surgery, with the diagnosis of hx of Schatzki ring, dysphagia, epigastric pain  The various methods of treatment have been discussed with the patient and family. After consideration of risks, benefits and other options for treatment, the patient has consented to  Procedure(s) with comments: ESOPHAGOGASTRODUODENOSCOPY (EGD) (N/A) - 245 as a surgical intervention .  The patient's history has been reviewed, patient examined, no change in status, stable for surgery.  I have reviewed the patient's chart and labs.  Questions were answered to the patient's satisfaction.     Manus Rudd  Patient seen and examined. No change. EGD with possible esophageal dilation, etc.The risks, benefits, limitations, alternatives and imponderables have been reviewed with the patient. Potential for esophageal dilation, biopsy, etc. have also been reviewed.  Questions have been answered. All parties agreeable.

## 2015-03-16 NOTE — Op Note (Signed)
[place], [id number]   ENDOSCOPY PROCEDURE REPORT  PATIENT: Tyler Mejia, Tyler Mejia  MR#: [id number] BIRTHDATE: 06-02-1936 , 57  yrs. old GENDER: male ENDOSCOPIST: R.  Garfield Cornea, MD FACP FACG REFERRED BY:  [name], M.D. PROCEDURE DATE:  2015/04/10 PROCEDURE:  EGD, diagnostic, EGD w/ biopsy , and Maloney dilation of esophagus INDICATIONS:  recurrent esophageal dysphagia.  Atypical chest pain with negative cardiac workup recently.Marland Kitchen MEDICATIONS: Versed 4 mg IV and Demerol 75 mg IV in divided doses.  ASA CLASS:      Class II  CONSENT: The risks, benefits, limitations, alternatives and imponderables have been discussed.  The potential for biopsy, esophogeal dilation, etc. have also been reviewed.  Questions have been answered.  All parties agreeable.  Please see the history and physical in the medical record for more information.  DESCRIPTION OF PROCEDURE: After the risks benefits and alternatives of the procedure were thoroughly explained, informed consent was obtained.  The EG-[id number] ([id number]) endoscope was introduced through the mouth and advanced to the second portion of the duodenum , limited by Without limitations. The instrument was slowly withdrawn as the mucosa was fully examined.    2 adjacent areas of submucosal extrinsic mass effect - distal esophagus.  Some encroachment into the lumen.  Just distal to these areas (just above the GE junction), there is a patch of abnormal appearing appearing mucosa (query squamous papilloma) seen.  No obvious infiltrating processes.  No esophagitis.  Esophagus remained patent throughout its course.  Stomach empty.  Small hiatal hernia.  Normal-appearing gastric mucosa.  Patent pylorus.  Normal first and second portion of the duodenum.  Scope withdrawn, a 65 French Maloney dilators passed for insertion easily.  A look back revealed no apparent complication related to this maneuver.  The focally  abnormal distal esophageal mucosa was biopsied for histologic study.  Retroflexed views revealed a hiatal hernia.     The scope was then withdrawn from the patient and the procedure completed.  COMPLICATIONS: There were no immediate complications.  ENDOSCOPIC IMPRESSION: Focal area of distal esophageal mucosal abnormality?"status post biopsy following passage of a Maloney dilator. 2 areas of nodularity with some extrinsic compression /encroachment into the lumen of uncertain significance.  RECOMMENDATIONS: Stop Protonix; begin a course of Deilant 60 mg daily.  Patient is to go bymy office for free samples. Depending on the path  report, patient may need endoscopic ultrasound to further evaluate the distal esophageal nodules further.  REPEAT EXAM:  eSigned:  R. Garfield Cornea, MD Rosalita Chessman [facility] 04/10/15 3:59 PM    CC:  CPT CODES: ICD CODES:  The ICD and CPT codes recommended by this software are interpretations from the data that the clinical staff has captured with the software.  The verification of the translation of this report to the ICD and CPT codes and modifiers is the sole responsibility of the health care institution and practicing physician where this report was generated.  [employer] will not be held responsible for the validity of the ICD and CPT codes included on this report.  AMA assumes no liability for data contained or not contained herein. CPT is a Designer, television/film set of the [employer].  PATIENT NAME:  Tyler Mejia, Tyler Mejia MR#: [id number]

## 2015-03-20 ENCOUNTER — Encounter (HOSPITAL_COMMUNITY): Payer: Self-pay | Admitting: Internal Medicine

## 2015-03-22 ENCOUNTER — Ambulatory Visit: Payer: Medicare Other | Admitting: Gastroenterology

## 2015-03-28 ENCOUNTER — Telehealth: Payer: Self-pay | Admitting: Cardiology

## 2015-03-28 ENCOUNTER — Ambulatory Visit: Payer: Medicare Other | Admitting: Internal Medicine

## 2015-03-28 NOTE — Telephone Encounter (Signed)
The patient said his insurance told him that this patient responsibility was for a denial due to unauthorized medication being used during his cardiac stress however this is not true. That amount of $16.43 is actually his 20% coinsurance.

## 2015-03-30 ENCOUNTER — Encounter: Payer: Self-pay | Admitting: Internal Medicine

## 2015-03-30 ENCOUNTER — Telehealth: Payer: Self-pay | Admitting: Internal Medicine

## 2015-03-30 NOTE — Telephone Encounter (Signed)
Dr.Rourk, you have not done a letter for this pt and his path report is not in your inbox.   He is requesting his results and he was given dexilant samples and is requesting an rx sent in.

## 2015-03-30 NOTE — Telephone Encounter (Signed)
Pt called this afternoon to say that he had a procedure done on 4/7 and he hasn't heard anything from [place] about his results. I told him that JL was aware and she would get with RMR and it may be tomorrow before she can call him back. Pt agreed. Patient also said that the samples he was given were helping and to call a prescription into [place]. Please call him at [phone]

## 2015-03-31 ENCOUNTER — Encounter: Payer: Self-pay | Admitting: Internal Medicine

## 2015-03-31 MED ORDER — DEXLANSOPRAZOLE 60 MG PO CPDR: 60.0000 mg | DELAYED_RELEASE_CAPSULE | Freq: Every day | ORAL | Status: DC

## 2015-03-31 NOTE — Telephone Encounter (Signed)
APPOINTMENT MADE AND LETTER SENT °

## 2015-03-31 NOTE — Telephone Encounter (Signed)
Pt is aware.  

## 2015-03-31 NOTE — Telephone Encounter (Signed)
rx sent to the pharmacy. Letter mailed to the pt.

## 2015-03-31 NOTE — Telephone Encounter (Signed)
Per RMR-  Send letter to patient.  Send copy of letter with path to referring provider and PCP.glad to hear the Dexilant is working. plse call in rx #30 w 11 refills; if he's doing well, he may not need any other testing. OV in 2- 3 mos if not already scheduled w extender

## 2015-03-31 NOTE — Telephone Encounter (Signed)
OV made °

## 2015-03-31 NOTE — Addendum Note (Signed)
Addended by: Claudina Lick on: 03/31/2015 08:49 AM   Modules accepted: Orders

## 2015-04-03 ENCOUNTER — Telehealth: Payer: Self-pay

## 2015-04-03 MED ORDER — LANSOPRAZOLE 30 MG PO CPDR: 30.0000 mg | DELAYED_RELEASE_CAPSULE | Freq: Every day | ORAL | Status: DC

## 2015-04-03 NOTE — Telephone Encounter (Signed)
Pt came by the office. dexilant is going to cost him $80 a month and he cannot afford that. He is requesting something else called in. Pt has tried: protonix, prevacid, aciphex and prilosec in the past.

## 2015-04-03 NOTE — Telephone Encounter (Signed)
I tried to call pt- LMOM with recommendations. Asked pt to call me back if he didn't want to try prevacid or if it was expensive too. rx sent to the pharmacy.

## 2015-04-03 NOTE — Telephone Encounter (Signed)
Which agent worked the best?  [place] similar to [employer] . We could try Prevacid 30 mg once daily.

## 2015-06-14 ENCOUNTER — Ambulatory Visit: Payer: Medicare Other | Admitting: Gastroenterology

## 2015-06-14 ENCOUNTER — Encounter: Payer: Self-pay | Admitting: *Deleted

## 2015-08-03 ENCOUNTER — Encounter: Payer: Self-pay | Admitting: *Deleted

## 2015-08-07 ENCOUNTER — Other Ambulatory Visit: Payer: Self-pay | Admitting: Cardiology

## 2015-08-07 ENCOUNTER — Ambulatory Visit (INDEPENDENT_AMBULATORY_CARE_PROVIDER_SITE_OTHER): Payer: Medicare Other | Admitting: Cardiology

## 2015-08-07 ENCOUNTER — Encounter: Payer: Self-pay | Admitting: Cardiology

## 2015-08-07 ENCOUNTER — Encounter: Payer: Self-pay | Admitting: *Deleted

## 2015-08-07 VITALS — BP 142/82 | HR 78 | Ht 69.0 in | Wt 244.0 lb

## 2015-08-07 DIAGNOSIS — I10 Essential (primary) hypertension: Secondary | ICD-10-CM | POA: Diagnosis not present

## 2015-08-07 DIAGNOSIS — I25119 Atherosclerotic heart disease of native coronary artery with unspecified angina pectoris: Secondary | ICD-10-CM

## 2015-08-07 DIAGNOSIS — I200 Unstable angina: Secondary | ICD-10-CM | POA: Diagnosis not present

## 2015-08-07 NOTE — Patient Instructions (Signed)
Your physician recommends that you schedule a follow-up appointment TO BE DETERMINED AFTER CATH  Your physician recommends that you continue on your current medications as directed. Please refer to the Current Medication list given to you today.  Your physician has requested that you have a cardiac catheterization. Cardiac catheterization is used to diagnose and/or treat various heart conditions. Doctors may recommend this procedure for a number of different reasons. The most common reason is to evaluate chest pain. Chest pain can be a symptom of coronary artery disease (CAD), and cardiac catheterization can show whether plaque is narrowing or blocking your heart's arteries. This procedure is also used to evaluate the valves, as well as measure the blood flow and oxygen levels in different parts of your heart. For further information please visit [email or web address]. Please follow instruction sheet, as given.  Thank you for choosing [facility]!!

## 2015-08-07 NOTE — Progress Notes (Signed)
Cardiology Office Note  Date: 08/07/2015   ID: Linard, Daft July 21, 1936, MRN [id number]  PCP: [name], MD  Primary Cardiologist: Rozann Lesches, MD   Chief Complaint  Patient presents with  . Chest Pain  . Coronary Artery Disease    History of Present Illness: HENDRICKS SCHWANDT is a 79 y.o. male last seen in March by Ms. Bonnell Public PA-C. I met him for the first time back in November 2015. At his most recent visit with Ms. Vita Barley, he was reporting increased angina symptoms and placed on Imdur. Follow-up Cardiolite was also obtained as detailed below, overall low risk without focal ischemia.  He is referred back to the office today by Dr. Willey Blade, here with his wife for follow-up. He reports continued episodes of exertional chest heaviness consistent with angina. He has undergone treatment for possible associated GERD/esophagitis including medication adjustments and EGD. Nothing in particular has helped his symptoms from this perspective.  We reviewed his cardiac history, previous DES to the RCA in March 2013 with residual circumflex and obtuse marginal disease. Although his Cardiolite study was low risk without focal ischemia, question is whether he may still have had progression in CAD leading to symptoms, not picked up on noninvasive imaging.  We discussed the possibility of cardiac catheterization for definitive assessment of coronary anatomy, risks and benefits reviewed, and he is in agreement to proceed.   Past Medical History  Diagnosis Date  . GERD (gastroesophageal reflux disease)   . Essential hypertension   . Hx of adenomatous colonic polyps   . Nephrolithiasis   . NSTEMI (non-ST elevated myocardial infarction) March 2013  . Obesity   . HLD (hyperlipidemia)   . COPD (chronic obstructive pulmonary disease)   . CAD (coronary artery disease)     DES to the RCA March 2013, residual 70-80% 1st marginal and a 60-70% mid LCX  . IBS (irritable bowel syndrome)     Past  Surgical History  Procedure Laterality Date  . Appendectomy    . Esophagogastroduodenoscopy  2003    distal erosion/small hiatal hernia  . Colonoscopy  08/21/2004    Pedunculated polyp at the splenic flexure  . Colonoscopy  09/2009    Dr. Rourk--> Cecal and hepatic flexure polyps, tubular adenomas. Next colonoscopy October 2013  . Esophagogastroduodenoscopy  09/2009    Dr. Evalee Mutton ring, mild distal esophageal erosions, status post Maloney dilation, small hiatal hernia.  . Back surgery    . Colonoscopy N/A 05/07/2013    RMR: colonic polyp/colonic diverticulosis  . Left heart catheterization with coronary angiogram N/A 03/02/2012    Procedure: LEFT HEART CATHETERIZATION WITH CORONARY ANGIOGRAM;  Surgeon: Peter M Martinique, MD;  Location: [facility] CATH LAB;  Service: Cardiovascular;  Laterality: N/A;  . Percutaneous coronary stent intervention (pci-s) N/A 03/02/2012    Procedure: PERCUTANEOUS CORONARY STENT INTERVENTION (PCI-S);  Surgeon: Peter M Martinique, MD;  Location: [facility] CATH LAB;  Service: Cardiovascular;  Laterality: N/A;  . Esophagogastroduodenoscopy N/A 03/16/2015    BBC:WUGQB HH/focal area of distal esophageal s/p bx    Current Outpatient Prescriptions  Medication Sig Dispense Refill  . amLODipine (NORVASC) 5 MG tablet Take 5 mg by mouth daily.     Marland Kitchen aspirin EC 81 MG tablet Take 81 mg by mouth daily.    Marland Kitchen dexlansoprazole (DEXILANT) 60 MG capsule Take 1 capsule (60 mg total) by mouth daily. 30 capsule 11  . nitroGLYCERIN (NITROSTAT) 0.4 MG SL tablet Place 0.4 mg under the tongue every 5 (five) minutes  x 3 doses as needed for chest pain (up to 3 doses).    . pantoprazole (PROTONIX) 40 MG tablet Take 40 mg by mouth daily.    Marland Kitchen UNABLE TO FIND OMEGA XL 300 mg TID     No current facility-administered medications for this visit.    Allergies:  Codeine   Social History: The patient  reports that he quit smoking about 13 years ago. His smoking use included Cigarettes. He has a 67.5 pack-year  smoking history. He has never used smokeless tobacco. He reports that he does not drink alcohol or use illicit drugs.   ROS:  Please see the history of present illness. Otherwise, complete review of systems is positive for none.  All other systems are reviewed and negative.   Physical Exam: VS:  BP 142/82 mmHg  Pulse 78  Ht _0  (1.753 m)  Wt 244 lb (110.678 kg)  BMI 36.02 kg/m2  SpO2 95%, BMI Body mass index is 36.02 kg/(m^2).  Wt Readings from Last 3 Encounters:  08/07/15 244 lb (110.678 kg)  03/16/15 243 lb (110.224 kg)  03/15/15 243 lb 3.2 oz (110.315 kg)     General: Patient appears comfortable at rest. HEENT: Conjunctiva and lids normal, oropharynx clear with moist mucosa. Neck: Supple, no elevated JVP or carotid bruits, no thyromegaly. Lungs: Clear to auscultation, nonlabored breathing at rest. Cardiac: Regular rate and rhythm, no S3 or significant systolic murmur, no pericardial rub. Abdomen: Soft, nontender, no hepatomegaly, bowel sounds present, no guarding or rebound. Extremities: No pitting edema, distal pulses 2+. Skin: Warm and dry. Musculoskeletal: No kyphosis. Neuropsychiatric: Alert and oriented x3, affect grossly appropriate.   ECG: Tracing from 07/05/2015 showed sinus rhythm with nonspecific T-wave flattening.   Recent Labwork: No results found for requested labs within last 365 days.     Component Value Date/Time   CHOL 101 05/13/2012 1055   TRIG 122.0 05/13/2012 1055   HDL 31.00* 05/13/2012 1055   CHOLHDL 3 05/13/2012 1055   VLDL 24.4 05/13/2012 1055   LDLCALC 46 05/13/2012 1055    Other Studies Reviewed Today:  Lexiscan Cardiolite 02/15/2015:  FINDINGS: Pharmacological stress  Baseline EKG shows normal sinus rhythm. After injection heart rate increased from 67 beats per min up to 84 beats per min, and blood pressure increased from 140/77 up to 161/82. The test was stopped after injection was complete, the patient did not experience  any chest pain. Post-injection EKG showed no specific ischemic changes and no significant arrhythmias.  Perfusion: There is a medium sized mild intensity fairly fixed inferoseptal wall defect. The inferoseptal wall has normal wall motion. There is noted radiotracer uptake in the adjacent gut. Findings most consistent with sub- diaphragmatic attenuation and artifact related to the gut radiotracer uptake. There are no other perfusion defects.  Wall Motion: Normal left ventricular wall motion. No left ventricular dilation.  Left Ventricular Ejection Fraction: 58 %  End diastolic volume 76 ml  End systolic volume 32 ml  IMPRESSION: 1. No reversible ischemia or infarction.  2. Normal left ventricular wall motion.  3. Left ventricular ejection fraction 58%  4. Low-risk stress test findings*.  Assessment and Plan:  1. Symptoms consistent with exertional angina despite medical therapy and associated GI workup which was felt to be potentially contributing. He had a low risk Cardiolite study back in March. Cardiac history includes DES to the RCA in 2013 with residual circumflex and obtuse marginal disease. Plan is to proceed with a diagnostic cardiac catheterization to most clearly  assess coronary anatomy and determine if any revascularization options need to be considered. Procedure is being scheduled with Dr. Irish Lack on August 31.  2. Essential hypertension, no changes made to current regimen.  3. GERD, currently on Dexilant and Prevacid.   Current medicines were reviewed with the patient today.  Disposition: FU with me after cardiac catheterization.  Signed, [name], MD, [facility] 08/07/2015 4:31 PM    [facility] [place], [place], [place] [address] Phone: [phone]; Fax: [phone]

## 2015-08-09 ENCOUNTER — Encounter (HOSPITAL_COMMUNITY): Payer: Self-pay | Admitting: Interventional Cardiology

## 2015-08-09 ENCOUNTER — Inpatient Hospital Stay (HOSPITAL_COMMUNITY)
Admission: RE | Admit: 2015-08-09 | Discharge: 2015-08-24 | DRG: 234 | Disposition: A | Discharge status: Skilled Nursing Facility | Payer: Medicare Other | Source: Ambulatory Visit | Attending: Cardiothoracic Surgery | Admitting: Cardiothoracic Surgery

## 2015-08-09 ENCOUNTER — Other Ambulatory Visit: Payer: Self-pay | Admitting: *Deleted

## 2015-08-09 ENCOUNTER — Encounter (HOSPITAL_COMMUNITY)
Admission: RE | Disposition: A | Discharge status: Skilled Nursing Facility | Payer: Self-pay | Source: Ambulatory Visit | Attending: Cardiothoracic Surgery

## 2015-08-09 ENCOUNTER — Ambulatory Visit (HOSPITAL_COMMUNITY): Payer: Medicare Other

## 2015-08-09 DIAGNOSIS — D696 Thrombocytopenia, unspecified: Secondary | ICD-10-CM | POA: Diagnosis not present

## 2015-08-09 DIAGNOSIS — E785 Hyperlipidemia, unspecified: Secondary | ICD-10-CM | POA: Diagnosis present

## 2015-08-09 DIAGNOSIS — Z951 Presence of aortocoronary bypass graft: Secondary | ICD-10-CM

## 2015-08-09 DIAGNOSIS — I4891 Unspecified atrial fibrillation: Secondary | ICD-10-CM | POA: Diagnosis not present

## 2015-08-09 DIAGNOSIS — Z87891 Personal history of nicotine dependence: Secondary | ICD-10-CM | POA: Diagnosis not present

## 2015-08-09 DIAGNOSIS — I252 Old myocardial infarction: Secondary | ICD-10-CM | POA: Diagnosis not present

## 2015-08-09 DIAGNOSIS — M79609 Pain in unspecified limb: Secondary | ICD-10-CM | POA: Diagnosis not present

## 2015-08-09 DIAGNOSIS — R633 Feeding difficulties: Secondary | ICD-10-CM | POA: Diagnosis not present

## 2015-08-09 DIAGNOSIS — E8770 Fluid overload, unspecified: Secondary | ICD-10-CM | POA: Diagnosis not present

## 2015-08-09 DIAGNOSIS — K9189 Other postprocedural complications and disorders of digestive system: Secondary | ICD-10-CM

## 2015-08-09 DIAGNOSIS — I200 Unstable angina: Secondary | ICD-10-CM | POA: Diagnosis not present

## 2015-08-09 DIAGNOSIS — I25119 Atherosclerotic heart disease of native coronary artery with unspecified angina pectoris: Secondary | ICD-10-CM | POA: Insufficient documentation

## 2015-08-09 DIAGNOSIS — K219 Gastro-esophageal reflux disease without esophagitis: Secondary | ICD-10-CM | POA: Diagnosis present

## 2015-08-09 DIAGNOSIS — J449 Chronic obstructive pulmonary disease, unspecified: Secondary | ICD-10-CM | POA: Diagnosis present

## 2015-08-09 DIAGNOSIS — E119 Type 2 diabetes mellitus without complications: Secondary | ICD-10-CM | POA: Diagnosis present

## 2015-08-09 DIAGNOSIS — I2510 Atherosclerotic heart disease of native coronary artery without angina pectoris: Secondary | ICD-10-CM

## 2015-08-09 DIAGNOSIS — Z955 Presence of coronary angioplasty implant and graft: Secondary | ICD-10-CM | POA: Diagnosis not present

## 2015-08-09 DIAGNOSIS — T50995A Adverse effect of other drugs, medicaments and biological substances, initial encounter: Secondary | ICD-10-CM | POA: Diagnosis present

## 2015-08-09 DIAGNOSIS — R0602 Shortness of breath: Secondary | ICD-10-CM

## 2015-08-09 DIAGNOSIS — Z4659 Encounter for fitting and adjustment of other gastrointestinal appliance and device: Secondary | ICD-10-CM

## 2015-08-09 DIAGNOSIS — I25110 Atherosclerotic heart disease of native coronary artery with unstable angina pectoris: Secondary | ICD-10-CM | POA: Diagnosis present

## 2015-08-09 DIAGNOSIS — Z452 Encounter for adjustment and management of vascular access device: Secondary | ICD-10-CM

## 2015-08-09 DIAGNOSIS — E669 Obesity, unspecified: Secondary | ICD-10-CM | POA: Diagnosis not present

## 2015-08-09 DIAGNOSIS — E876 Hypokalemia: Secondary | ICD-10-CM | POA: Diagnosis not present

## 2015-08-09 DIAGNOSIS — D62 Acute posthemorrhagic anemia: Secondary | ICD-10-CM | POA: Diagnosis not present

## 2015-08-09 DIAGNOSIS — Z7982 Long term (current) use of aspirin: Secondary | ICD-10-CM | POA: Diagnosis not present

## 2015-08-09 DIAGNOSIS — R079 Chest pain, unspecified: Secondary | ICD-10-CM | POA: Diagnosis present

## 2015-08-09 DIAGNOSIS — K913 Postprocedural intestinal obstruction: Secondary | ICD-10-CM | POA: Diagnosis not present

## 2015-08-09 DIAGNOSIS — Z6835 Body mass index (BMI) 35.0-35.9, adult: Secondary | ICD-10-CM

## 2015-08-09 DIAGNOSIS — J9811 Atelectasis: Secondary | ICD-10-CM | POA: Diagnosis not present

## 2015-08-09 DIAGNOSIS — M7989 Other specified soft tissue disorders: Secondary | ICD-10-CM | POA: Diagnosis not present

## 2015-08-09 DIAGNOSIS — R109 Unspecified abdominal pain: Secondary | ICD-10-CM

## 2015-08-09 DIAGNOSIS — R112 Nausea with vomiting, unspecified: Secondary | ICD-10-CM

## 2015-08-09 DIAGNOSIS — K567 Ileus, unspecified: Secondary | ICD-10-CM

## 2015-08-09 DIAGNOSIS — I10 Essential (primary) hypertension: Secondary | ICD-10-CM | POA: Diagnosis not present

## 2015-08-09 HISTORY — PX: CARDIAC CATHETERIZATION: SHX172

## 2015-08-09 LAB — BASIC METABOLIC PANEL
ANION GAP: 6 (ref 5–15)
BUN: 14 mg/dL (ref 6–20)
CALCIUM: 9.1 mg/dL (ref 8.9–10.3)
CO2: 28 mmol/L (ref 22–32)
Chloride: 106 mmol/L (ref 101–111)
Creatinine, Ser: 0.98 mg/dL (ref 0.61–1.24)
GLUCOSE: 112 mg/dL — AB (ref 65–99)
POTASSIUM: 3.9 mmol/L (ref 3.5–5.1)
Sodium: 140 mmol/L (ref 135–145)

## 2015-08-09 LAB — URINALYSIS, ROUTINE W REFLEX MICROSCOPIC
Bilirubin Urine: NEGATIVE
Glucose, UA: NEGATIVE mg/dL
Hgb urine dipstick: NEGATIVE
Ketones, ur: NEGATIVE mg/dL
Leukocytes, UA: NEGATIVE
Nitrite: NEGATIVE
Protein, ur: NEGATIVE mg/dL
Specific Gravity, Urine: 1.012 (ref 1.005–1.030)
Urobilinogen, UA: 0.2 mg/dL (ref 0.0–1.0)
pH: 5.5 (ref 5.0–8.0)

## 2015-08-09 LAB — CBC
HEMATOCRIT: 44.3 % (ref 39.0–52.0)
HEMOGLOBIN: 15 g/dL (ref 13.0–17.0)
MCH: 28.2 pg (ref 26.0–34.0)
MCHC: 33.9 g/dL (ref 30.0–36.0)
MCV: 83.3 fL (ref 78.0–100.0)
Platelets: 153 10*3/uL (ref 150–400)
RBC: 5.32 MIL/uL (ref 4.22–5.81)
RDW: 13 % (ref 11.5–15.5)
WBC: 7.3 10*3/uL (ref 4.0–10.5)

## 2015-08-09 LAB — SURGICAL PCR SCREEN
MRSA, PCR: NEGATIVE
Staphylococcus aureus: NEGATIVE

## 2015-08-09 LAB — PROTIME-INR
INR: 1.05 (ref 0.00–1.49)
PROTHROMBIN TIME: 13.9 s (ref 11.6–15.2)

## 2015-08-09 SURGERY — LEFT HEART CATH AND CORONARY ANGIOGRAPHY

## 2015-08-09 MED ORDER — ASPIRIN EC 81 MG PO TBEC
81.0000 mg | DELAYED_RELEASE_TABLET | Freq: Every day | ORAL | Status: DC
  Administered 2015-08-10: 81 mg via ORAL
  Filled 2015-08-09 (×2): qty 1

## 2015-08-09 MED ORDER — SODIUM CHLORIDE 0.9 % IJ SOLN: 3.0000 mL | INTRAMUSCULAR | Status: DC | PRN

## 2015-08-09 MED ORDER — SODIUM CHLORIDE 0.9 % WEIGHT BASED INFUSION: 1.0000 mL/kg/h | INTRAVENOUS | Status: AC

## 2015-08-09 MED ORDER — IOHEXOL 350 MG/ML SOLN
INTRAVENOUS | Status: DC | PRN
  Administered 2015-08-09: 70 mL via INTRACARDIAC

## 2015-08-09 MED ORDER — PANTOPRAZOLE SODIUM 40 MG PO TBEC
40.0000 mg | DELAYED_RELEASE_TABLET | Freq: Every day | ORAL | Status: DC
  Administered 2015-08-10: 40 mg via ORAL
  Filled 2015-08-09: qty 1

## 2015-08-09 MED ORDER — FENTANYL CITRATE (PF) 100 MCG/2ML IJ SOLN
INTRAMUSCULAR | Status: AC
  Filled 2015-08-09: qty 4

## 2015-08-09 MED ORDER — ASPIRIN 81 MG PO CHEW: 81.0000 mg | CHEWABLE_TABLET | ORAL | Status: DC

## 2015-08-09 MED ORDER — HEPARIN (PORCINE) IN NACL 2-0.9 UNIT/ML-% IJ SOLN
INTRAMUSCULAR | Status: AC
  Filled 2015-08-09: qty 1500

## 2015-08-09 MED ORDER — SODIUM CHLORIDE 0.9 % IV SOLN: 250.0000 mL | INTRAVENOUS | Status: DC | PRN

## 2015-08-09 MED ORDER — ATORVASTATIN CALCIUM 80 MG PO TABS
80.0000 mg | ORAL_TABLET | ORAL | Status: DC
  Filled 2015-08-09: qty 1

## 2015-08-09 MED ORDER — MIDAZOLAM HCL 2 MG/2ML IJ SOLN
INTRAMUSCULAR | Status: AC
  Filled 2015-08-09: qty 4

## 2015-08-09 MED ORDER — SODIUM CHLORIDE 0.9 % IV SOLN
INTRAVENOUS | Status: DC
  Administered 2015-08-09: 11:00:00 via INTRAVENOUS

## 2015-08-09 MED ORDER — NITROGLYCERIN 0.4 MG SL SUBL: 0.4000 mg | SUBLINGUAL_TABLET | SUBLINGUAL | Status: DC | PRN

## 2015-08-09 MED ORDER — SODIUM CHLORIDE 0.9 % IJ SOLN: 3.0000 mL | Freq: Two times a day (BID) | INTRAMUSCULAR | Status: DC

## 2015-08-09 MED ORDER — ONDANSETRON HCL 4 MG/2ML IJ SOLN: 4.0000 mg | Freq: Four times a day (QID) | INTRAMUSCULAR | Status: DC | PRN

## 2015-08-09 MED ORDER — MIDAZOLAM HCL 2 MG/2ML IJ SOLN
INTRAMUSCULAR | Status: DC | PRN
  Administered 2015-08-09: 2 mg via INTRAVENOUS

## 2015-08-09 MED ORDER — SODIUM CHLORIDE 0.9 % IJ SOLN
3.0000 mL | Freq: Two times a day (BID) | INTRAMUSCULAR | Status: DC
  Administered 2015-08-10: 3 mL via INTRAVENOUS

## 2015-08-09 MED ORDER — LIDOCAINE HCL (PF) 1 % IJ SOLN
INTRAMUSCULAR | Status: AC
  Filled 2015-08-09: qty 30

## 2015-08-09 MED ORDER — ACETAMINOPHEN 325 MG PO TABS: 650.0000 mg | ORAL_TABLET | ORAL | Status: DC | PRN

## 2015-08-09 MED ORDER — FENTANYL CITRATE (PF) 100 MCG/2ML IJ SOLN
INTRAMUSCULAR | Status: DC | PRN
  Administered 2015-08-09: 25 ug via INTRAVENOUS

## 2015-08-09 MED ORDER — ATORVASTATIN CALCIUM 80 MG PO TABS
80.0000 mg | ORAL_TABLET | Freq: Every day | ORAL | Status: DC
  Filled 2015-08-09: qty 1

## 2015-08-09 MED ORDER — AMLODIPINE BESYLATE 5 MG PO TABS
5.0000 mg | ORAL_TABLET | Freq: Every day | ORAL | Status: DC
  Administered 2015-08-10: 5 mg via ORAL
  Filled 2015-08-09 (×2): qty 1

## 2015-08-09 MED ORDER — LIDOCAINE HCL (PF) 1 % IJ SOLN
INTRAMUSCULAR | Status: DC | PRN
  Administered 2015-08-09: 13:00:00

## 2015-08-09 SURGICAL SUPPLY — 8 items
CATH INFINITI 5FR MULTPACK ANG (CATHETERS) ×1 IMPLANT
KIT HEART LEFT (KITS) ×2 IMPLANT
PACK CARDIAC CATHETERIZATION (CUSTOM PROCEDURE TRAY) ×2 IMPLANT
SHEATH PINNACLE 5F 10CM (SHEATH) ×1 IMPLANT
SYR MEDRAD MARK V 150ML (SYRINGE) ×2 IMPLANT
TRANSDUCER W/STOPCOCK (MISCELLANEOUS) ×2 IMPLANT
TUBING CIL FLEX 10 FLL-RA (TUBING) ×2 IMPLANT
WIRE EMERALD 3MM-J .035X150CM (WIRE) ×1 IMPLANT

## 2015-08-09 NOTE — Progress Notes (Signed)
Site area: rt groin Site Prior to Removal:  Level 0 Pressure Applied For:  20 minutes Manual:   yes Patient Status During Pull:  stable Post Pull Site:  Level  0 Post Pull Instructions Given:  yes Post Pull Pulses Present: yes Dressing Applied:  tegaderm Bedrest begins @ 1325 Comments:  0

## 2015-08-09 NOTE — Progress Notes (Signed)
Day of Surgery Procedure(s) (LRB): Left Heart Cath and Coronary Angiography (N/A) Subjective: Patient examined and cath reviewed CABG planned Fri am Full consult to follow Objective: Vital signs in last 24 hours: Temp:  [97.1 F (36.2 C)-97.7 F (36.5 C)] 97.7 F (36.5 C) (08/31 1402) Pulse Rate:  [0-77] 62 (08/31 1402) Cardiac Rhythm:  [-] Normal sinus rhythm (08/31 1400) Resp:  [0-20] 18 (08/31 1402) BP: (130-160)/(67-99) 146/70 mmHg (08/31 1402) SpO2:  [0 %-99 %] 95 % (08/31 1402) Weight:  [240 lb (108.863 kg)] 240 lb (108.863 kg) (08/31 0931)  Hemodynamic parameters for last 24 hours:  stable  Intake/Output from previous day:   Intake/Output this shift:   Alert, comfortable No hematoma at cath site   Lab Results:  Recent Labs  08/09/15 1030  WBC 7.3  HGB 15.0  HCT 44.3  PLT 153   BMET:  Recent Labs  08/09/15 1030  NA 140  K 3.9  CL 106  CO2 28  GLUCOSE 112*  BUN 14  CREATININE 0.98  CALCIUM 9.1     PT/INR:  Recent Labs  08/09/15 1030  LABPROT 13.9  INR 1.05   ABG No results found for: PHART, HCO3, TCO2, ACIDBASEDEF, O2SAT CBG (last 3)  No results for input(s): GLUCAP in the last 72 hours.  Assessment/Plan: S/P Procedure(s) (LRB): Left Heart Cath and Coronary Angiography (N/A) Mobilize pre cabg dopplers pending   LOS: 0 days    Tharon Aquas Trigt III 08/09/2015

## 2015-08-09 NOTE — Interval H&P Note (Signed)
Cath Lab Visit (complete for each Cath Lab visit)  Clinical Evaluation Leading to the Procedure:   ACS: No.  Non-ACS:    Anginal Classification: CCS III  Anti-ischemic medical therapy: Maximal Therapy (2 or more classes of medications)  Non-Invasive Test Results: Low-risk stress test findings: cardiac mortality <1%/year  Prior CABG: No previous CABG      History and Physical Interval Note:  08/09/2015 12:03 PM  Tyler Mejia  has presented today for surgery, with the diagnosis of angina/cad  The various methods of treatment have been discussed with the patient and family. After consideration of risks, benefits and other options for treatment, the patient has consented to  Procedure(s): Left Heart Cath and Coronary Angiography (N/A) as a surgical intervention .  The patient's history has been reviewed, patient examined, no change in status, stable for surgery.  I have reviewed the patient's chart and labs.  Questions were answered to the patient's satisfaction.     VARANASI,JAYADEEP S.

## 2015-08-09 NOTE — H&P (View-Only) (Signed)
  Cardiology Office Note  Date: 08/07/2015   ID: Tyler Mejia, DOB 09/03/1936, MRN [id number]  PCP: Mejia,ROY, MD  Primary Cardiologist: Tyler McDowell, MD   Chief Complaint  Patient presents with  . Chest Pain  . Coronary Artery Disease    History of Present Illness: Tyler Mejia is a 78 y.o. male last seen in March by Ms. Lenze PA-C. I met him for the first time back in November 2015. At his most recent visit with Ms. Lenze PA-C, he was reporting increased angina symptoms and placed on Imdur. Follow-up Cardiolite was also obtained as detailed below, overall low risk without focal ischemia.  He is referred back to the office today by Tyler Mejia, here with his wife for follow-up. He reports continued episodes of exertional chest heaviness consistent with angina. He has undergone treatment for possible associated GERD/esophagitis including medication adjustments and EGD. Nothing in particular has helped his symptoms from this perspective.  We reviewed his cardiac history, previous DES to the RCA in March 2013 with residual circumflex and obtuse marginal disease. Although his Cardiolite study was low risk without focal ischemia, question is whether he may still have had progression in CAD leading to symptoms, not picked up on noninvasive imaging.  We discussed the possibility of cardiac catheterization for definitive assessment of coronary anatomy, risks and benefits reviewed, and he is in agreement to proceed.   Past Medical History  Diagnosis Date  . GERD (gastroesophageal reflux disease)   . Essential hypertension   . Hx of adenomatous colonic polyps   . Nephrolithiasis   . NSTEMI (non-ST elevated myocardial infarction) March 2013  . Obesity   . HLD (hyperlipidemia)   . COPD (chronic obstructive pulmonary disease)   . CAD (coronary artery disease)     DES to the RCA March 2013, residual 70-80% 1st marginal and a 60-70% mid LCX  . IBS (irritable bowel syndrome)     Past  Surgical History  Procedure Laterality Date  . Appendectomy    . Esophagogastroduodenoscopy  2003    distal erosion/small hiatal hernia  . Colonoscopy  08/21/2004    Pedunculated polyp at the splenic flexure  . Colonoscopy  09/2009    Dr. Rourk--> Cecal and hepatic flexure polyps, tubular adenomas. Next colonoscopy October 2013  . Esophagogastroduodenoscopy  09/2009    Dr. Rourk-->Schatzki ring, mild distal esophageal erosions, status post Maloney dilation, small hiatal hernia.  . Back surgery    . Colonoscopy N/A 05/07/2013    RMR: colonic polyp/colonic diverticulosis  . Left heart catheterization with coronary angiogram N/A 03/02/2012    Procedure: LEFT HEART CATHETERIZATION WITH CORONARY ANGIOGRAM;  Surgeon: Tyler M Jordan, MD;  Location: [facility] CATH LAB;  Service: Cardiovascular;  Laterality: N/A;  . Percutaneous coronary stent intervention (pci-s) N/A 03/02/2012    Procedure: PERCUTANEOUS CORONARY STENT INTERVENTION (PCI-S);  Surgeon: Tyler M Jordan, MD;  Location: [facility] CATH LAB;  Service: Cardiovascular;  Laterality: N/A;  . Esophagogastroduodenoscopy N/A 03/16/2015    RMR:small HH/focal area of distal esophageal s/p bx    Current Outpatient Prescriptions  Medication Sig Dispense Refill  . amLODipine (NORVASC) 5 MG tablet Take 5 mg by mouth daily.     . aspirin EC 81 MG tablet Take 81 mg by mouth daily.    . dexlansoprazole (DEXILANT) 60 MG capsule Take 1 capsule (60 mg total) by mouth daily. 30 capsule 11  . nitroGLYCERIN (NITROSTAT) 0.4 MG SL tablet Place 0.4 mg under the tongue every 5 (five) minutes   x 3 doses as needed for chest pain (up to 3 doses).    . pantoprazole (PROTONIX) 40 MG tablet Take 40 mg by mouth daily.    . UNABLE TO FIND OMEGA XL 300 mg TID     No current facility-administered medications for this visit.    Allergies:  Codeine   Social History: The patient  reports that he quit smoking about 13 years ago. His smoking use included Cigarettes. He has a 67.5 pack-year  smoking history. He has never used smokeless tobacco. He reports that he does not drink alcohol or use illicit drugs.   ROS:  Please see the history of present illness. Otherwise, complete review of systems is positive for none.  All other systems are reviewed and negative.   Physical Exam: VS:  BP 142/82 mmHg  Pulse 78  Ht 5' 9" (1.753 m)  Wt 244 lb (110.678 kg)  BMI 36.02 kg/m2  SpO2 95%, BMI Body mass index is 36.02 kg/(m^2).  Wt Readings from Last 3 Encounters:  08/07/15 244 lb (110.678 kg)  03/16/15 243 lb (110.224 kg)  03/15/15 243 lb 3.2 oz (110.315 kg)     General: Patient appears comfortable at rest. HEENT: Conjunctiva and lids normal, oropharynx clear with moist mucosa. Neck: Supple, no elevated JVP or carotid bruits, no thyromegaly. Lungs: Clear to auscultation, nonlabored breathing at rest. Cardiac: Regular rate and rhythm, no S3 or significant systolic murmur, no pericardial rub. Abdomen: Soft, nontender, no hepatomegaly, bowel sounds present, no guarding or rebound. Extremities: No pitting edema, distal pulses 2+. Skin: Warm and dry. Musculoskeletal: No kyphosis. Neuropsychiatric: Alert and oriented x3, affect grossly appropriate.   ECG: Tracing from 07/05/2015 showed sinus rhythm with nonspecific T-wave flattening.   Recent Labwork: No results found for requested labs within last 365 days.     Component Value Date/Time   CHOL 101 05/13/2012 1055   TRIG 122.0 05/13/2012 1055   HDL 31.00* 05/13/2012 1055   CHOLHDL 3 05/13/2012 1055   VLDL 24.4 05/13/2012 1055   LDLCALC 46 05/13/2012 1055    Other Studies Reviewed Today:  Lexiscan Cardiolite 02/15/2015:  FINDINGS: Pharmacological stress  Baseline EKG shows normal sinus rhythm. After injection heart rate increased from 67 beats per min up to 84 beats per min, and blood pressure increased from 140/77 up to 161/82. The test was stopped after injection was complete, the patient did not experience  any chest pain. Post-injection EKG showed no specific ischemic changes and no significant arrhythmias.  Perfusion: There is a medium sized mild intensity fairly fixed inferoseptal wall defect. The inferoseptal wall has normal wall motion. There is noted radiotracer uptake in the adjacent gut. Findings most consistent with sub- diaphragmatic attenuation and artifact related to the gut radiotracer uptake. There are no other perfusion defects.  Wall Motion: Normal left ventricular wall motion. No left ventricular dilation.  Left Ventricular Ejection Fraction: 58 %  End diastolic volume 76 ml  End systolic volume 32 ml  IMPRESSION: 1. No reversible ischemia or infarction.  2. Normal left ventricular wall motion.  3. Left ventricular ejection fraction 58%  4. Low-risk stress test findings*.  Assessment and Plan:  1. Symptoms consistent with exertional angina despite medical therapy and associated GI workup which was felt to be potentially contributing. He had a low risk Cardiolite study back in March. Cardiac history includes DES to the RCA in 2013 with residual circumflex and obtuse marginal disease. Plan is to proceed with a diagnostic cardiac catheterization to most clearly   assess coronary anatomy and determine if any revascularization options need to be considered. Procedure is being scheduled with Dr. Varanasi on August 31.  2. Essential hypertension, no changes made to current regimen.  3. GERD, currently on Dexilant and Prevacid.   Current medicines were reviewed with the patient today.  Disposition: FU with me after cardiac catheterization.  Signed, Tyler G. McDowell, MD, FACC 08/07/2015 4:31 PM    [facility] [place] [address], [place],  [address] Phone: [phone]; Fax: [phone]  

## 2015-08-10 ENCOUNTER — Ambulatory Visit (HOSPITAL_COMMUNITY): Payer: Medicare Other

## 2015-08-10 ENCOUNTER — Encounter (HOSPITAL_COMMUNITY): Payer: Self-pay | Admitting: Physician Assistant

## 2015-08-10 ENCOUNTER — Inpatient Hospital Stay (HOSPITAL_COMMUNITY): Payer: Medicare Other

## 2015-08-10 DIAGNOSIS — M79609 Pain in unspecified limb: Secondary | ICD-10-CM

## 2015-08-10 DIAGNOSIS — I25110 Atherosclerotic heart disease of native coronary artery with unstable angina pectoris: Secondary | ICD-10-CM

## 2015-08-10 DIAGNOSIS — I200 Unstable angina: Secondary | ICD-10-CM

## 2015-08-10 DIAGNOSIS — I2510 Atherosclerotic heart disease of native coronary artery without angina pectoris: Secondary | ICD-10-CM

## 2015-08-10 DIAGNOSIS — I10 Essential (primary) hypertension: Secondary | ICD-10-CM

## 2015-08-10 DIAGNOSIS — M7989 Other specified soft tissue disorders: Secondary | ICD-10-CM

## 2015-08-10 DIAGNOSIS — I25119 Atherosclerotic heart disease of native coronary artery with unspecified angina pectoris: Secondary | ICD-10-CM

## 2015-08-10 DIAGNOSIS — E669 Obesity, unspecified: Secondary | ICD-10-CM

## 2015-08-10 LAB — PREPARE RBC (CROSSMATCH)

## 2015-08-10 LAB — PULMONARY FUNCTION TEST
DL/VA % pred: 102 %
DL/VA: 4.44 ml/min/mmHg/L
DLCO cor % pred: 81 %
DLCO cor: 22.09 ml/min/mmHg
DLCO unc % pred: 82 %
DLCO unc: 22.33 ml/min/mmHg
FEF 25-75 Post: 1.89 L/sec
FEF 25-75 Pre: 1.45 L/sec
FEF2575-%Change-Post: 29 %
FEF2575-%Pred-Post: 107 %
FEF2575-%Pred-Pre: 82 %
FEV1-%Change-Post: 5 %
FEV1-%Pred-Post: 75 %
FEV1-%Pred-Pre: 71 %
FEV1-Post: 1.89 L
FEV1-Pre: 1.78 L
FEV1FVC-%Change-Post: -6 %
FEV1FVC-%Pred-Pre: 105 %
FEV6-%Change-Post: 12 %
FEV6-%Pred-Post: 79 %
FEV6-%Pred-Pre: 71 %
FEV6-Post: 2.59 L
FEV6-Pre: 2.31 L
FEV6FVC-%Change-Post: -1 %
FEV6FVC-%Pred-Post: 104 %
FEV6FVC-%Pred-Pre: 106 %
FVC-%Change-Post: 13 %
FVC-%Pred-Post: 75 %
FVC-%Pred-Pre: 66 %
FVC-Post: 2.66 L
FVC-Pre: 2.34 L
Post FEV1/FVC ratio: 71 %
Post FEV6/FVC ratio: 98 %
Pre FEV1/FVC ratio: 76 %
Pre FEV6/FVC Ratio: 99 %
RV % pred: 123 %
RV: 2.93 L
TLC % pred: 96 %
TLC: 6.02 L

## 2015-08-10 LAB — BLOOD GAS, ARTERIAL
Acid-Base Excess: 0.4 mmol/L (ref 0.0–2.0)
Bicarbonate: 24.3 mEq/L — ABNORMAL HIGH (ref 20.0–24.0)
Drawn by: 418751
FIO2: 0.21
O2 Saturation: 93.8 %
Patient temperature: 98.6
TCO2: 25.5 mmol/L (ref 0–100)
pCO2 arterial: 38.1 mmHg (ref 35.0–45.0)
pH, Arterial: 7.422 (ref 7.350–7.450)
pO2, Arterial: 66.1 mmHg — ABNORMAL LOW (ref 80.0–100.0)

## 2015-08-10 LAB — APTT: aPTT: 33 seconds (ref 24–37)

## 2015-08-10 LAB — ABO/RH: ABO/RH(D): A NEG

## 2015-08-10 MED ORDER — TEMAZEPAM 15 MG PO CAPS
15.0000 mg | ORAL_CAPSULE | Freq: Once | ORAL | Status: AC | PRN
  Administered 2015-08-10: 15 mg via ORAL
  Filled 2015-08-10: qty 1

## 2015-08-10 MED ORDER — VANCOMYCIN HCL 10 G IV SOLR
1250.0000 mg | INTRAVENOUS | Status: AC
  Administered 2015-08-11: 1250 mg via INTRAVENOUS
  Filled 2015-08-10 (×2): qty 1250

## 2015-08-10 MED ORDER — MAGNESIUM SULFATE 50 % IJ SOLN
40.0000 meq | INTRAMUSCULAR | Status: DC
  Filled 2015-08-10: qty 10

## 2015-08-10 MED ORDER — BISACODYL 5 MG PO TBEC: 5.0000 mg | DELAYED_RELEASE_TABLET | Freq: Once | ORAL | Status: DC

## 2015-08-10 MED ORDER — CHLORHEXIDINE GLUCONATE 4 % EX LIQD
60.0000 mL | Freq: Once | CUTANEOUS | Status: AC
  Administered 2015-08-10: 4 via TOPICAL
  Filled 2015-08-10: qty 60

## 2015-08-10 MED ORDER — SODIUM CHLORIDE 0.9 % IV SOLN
INTRAVENOUS | Status: DC
  Filled 2015-08-10: qty 40

## 2015-08-10 MED ORDER — SODIUM CHLORIDE 0.9 % IV SOLN
INTRAVENOUS | Status: DC
  Filled 2015-08-10: qty 2.5

## 2015-08-10 MED ORDER — ALPRAZOLAM 0.25 MG PO TABS: 0.2500 mg | ORAL_TABLET | ORAL | Status: DC | PRN

## 2015-08-10 MED ORDER — NITROGLYCERIN IN D5W 200-5 MCG/ML-% IV SOLN
2.0000 ug/min | INTRAVENOUS | Status: AC
  Administered 2015-08-11: 5 ug/min via INTRAVENOUS
  Filled 2015-08-10: qty 250

## 2015-08-10 MED ORDER — HEPARIN SODIUM (PORCINE) 1000 UNIT/ML IJ SOLN
INTRAMUSCULAR | Status: DC
  Filled 2015-08-10: qty 30

## 2015-08-10 MED ORDER — DEXMEDETOMIDINE HCL IN NACL 400 MCG/100ML IV SOLN
0.1000 ug/kg/h | INTRAVENOUS | Status: DC
  Filled 2015-08-10: qty 100

## 2015-08-10 MED ORDER — EPINEPHRINE HCL 1 MG/ML IJ SOLN
0.0000 ug/min | INTRAMUSCULAR | Status: DC
  Filled 2015-08-10: qty 4

## 2015-08-10 MED ORDER — PHENYLEPHRINE HCL 10 MG/ML IJ SOLN
30.0000 ug/min | INTRAVENOUS | Status: DC
  Filled 2015-08-10: qty 2

## 2015-08-10 MED ORDER — DIAZEPAM 5 MG PO TABS
5.0000 mg | ORAL_TABLET | Freq: Once | ORAL | Status: AC
  Administered 2015-08-11: 5 mg via ORAL
  Filled 2015-08-10: qty 1

## 2015-08-10 MED ORDER — ALBUTEROL SULFATE (2.5 MG/3ML) 0.083% IN NEBU
2.5000 mg | INHALATION_SOLUTION | Freq: Once | RESPIRATORY_TRACT | Status: AC
  Administered 2015-08-10: 2.5 mg via RESPIRATORY_TRACT

## 2015-08-10 MED ORDER — CHLORHEXIDINE GLUCONATE 4 % EX LIQD
60.0000 mL | Freq: Once | CUTANEOUS | Status: AC
  Administered 2015-08-11: 4 via TOPICAL
  Filled 2015-08-10: qty 60

## 2015-08-10 MED ORDER — PLASMA-LYTE 148 IV SOLN
INTRAVENOUS | Status: AC
  Administered 2015-08-11: 500 mL
  Filled 2015-08-10: qty 2.5

## 2015-08-10 MED ORDER — DOPAMINE-DEXTROSE 3.2-5 MG/ML-% IV SOLN
0.0000 ug/kg/min | INTRAVENOUS | Status: DC
  Filled 2015-08-10: qty 250

## 2015-08-10 MED ORDER — POTASSIUM CHLORIDE 2 MEQ/ML IV SOLN
80.0000 meq | INTRAVENOUS | Status: DC
  Filled 2015-08-10: qty 40

## 2015-08-10 MED ORDER — METOPROLOL TARTRATE 12.5 MG HALF TABLET
12.5000 mg | ORAL_TABLET | Freq: Once | ORAL | Status: AC
  Administered 2015-08-11: 12.5 mg via ORAL
  Filled 2015-08-10: qty 1

## 2015-08-10 MED ORDER — DEXTROSE 5 % IV SOLN
1.5000 g | INTRAVENOUS | Status: AC
  Administered 2015-08-11: .75 g via INTRAVENOUS
  Administered 2015-08-11: 1.5 g via INTRAVENOUS
  Filled 2015-08-10: qty 1.5

## 2015-08-10 MED ORDER — DEXTROSE 5 % IV SOLN
750.0000 mg | INTRAVENOUS | Status: DC
  Filled 2015-08-10: qty 750

## 2015-08-10 NOTE — Consult Note (Signed)
[address]       [place] [address]             [phone]        Tyler Mejia Mejia Medical Record #[id number] Date of Birth: 27-May-1936  Referring: No ref. provider found Primary Care: [name], MD CHIEF COMPLAINT  - chest pain increasing in severity and frequency and at rest consistent with unstable angina  History of Present Illness:     79 year old obese Caucasian male nonsmoker with history of exertional chest discomfort over the past few weeks. He has a past history of coronary disease and had a drug-eluting stent placement right coronary artery in 2013. The patient underwent cardiac catheterization which demonstrated a patent stent in the RCA with minimal disease of the vessel. However he had high-grade 95% stenosis of the LAD-diagonal and 80-90 percent stenosis of the OM1, OM 2. LVEF appeared to be normal. Echocardiogram has been performed but not yet reported. The patient has been stable in the hospital. Surgical coronary revascularization was recommended by his interventional cardiologist as the best long-term therapy and I agree with that recommendation.   Current Activity/ Functional Status: Patient is retired but still active and enjoys doing his own yard work   Zubrod Score: At the time of surgery this patient's most appropriate activity status/level should be described as: []     0    Normal activity, no symptoms []     1    Restricted in physical strenuous activity but ambulatory, able to do out light work [x]     2    Ambulatory and capable of self care, unable to do work activities, up and about                 more than 50%  Of the time                            []     3    Only limited self care, in bed greater than 50% of waking hours []     4    Completely disabled, no self care, confined to bed or chair []     5    Moribund  Past Medical History  Diagnosis Date  . GERD (gastroesophageal reflux disease)   . Essential hypertension     . Hx of adenomatous colonic polyps   . Nephrolithiasis   . NSTEMI (non-ST elevated myocardial infarction) March 2013  . Obesity   . HLD (hyperlipidemia)     a. H/o multiple statin intolerances.  Marland Kitchen COPD (chronic obstructive pulmonary disease)   . CAD (coronary artery disease)     DES to the RCA March 2013, residual 70-80% 1st marginal and a 60-70% mid LCX  . IBS (irritable bowel syndrome)     Past Surgical History  Procedure Laterality Date  . Appendectomy    . Esophagogastroduodenoscopy  2003    distal erosion/small hiatal hernia  . Colonoscopy  08/21/2004    Pedunculated polyp at the splenic flexure  . Colonoscopy  09/2009    Dr. Rourk--> Cecal and hepatic flexure polyps, tubular adenomas. Next colonoscopy October 2013  . Esophagogastroduodenoscopy  09/2009    Dr. Evalee Mutton ring, mild distal esophageal erosions, status post Maloney dilation, small hiatal hernia.  . Back surgery    . Colonoscopy N/A 05/07/2013    RMR: colonic polyp/colonic diverticulosis  . Left heart catheterization with coronary angiogram N/A 03/02/2012  Procedure: LEFT HEART CATHETERIZATION WITH CORONARY ANGIOGRAM;  Surgeon: Peter M Martinique, MD;  Location: [facility] CATH LAB;  Service: Cardiovascular;  Laterality: N/A;  . Percutaneous coronary stent intervention (pci-s) N/A 03/02/2012    Procedure: PERCUTANEOUS CORONARY STENT INTERVENTION (PCI-S);  Surgeon: Peter M Martinique, MD;  Location: [facility] CATH LAB;  Service: Cardiovascular;  Laterality: N/A;  . Esophagogastroduodenoscopy N/A 03/16/2015    ION:GEXBM HH/focal area of distal esophageal s/p bx  . Cardiac catheterization N/A 08/09/2015    Procedure: Left Heart Cath and Coronary Angiography;  Surgeon: Jettie Booze, MD;  Location: [place] CV LAB;  Service: Cardiovascular;  Laterality: N/A;    History  Smoking status  . Former Smoker -- 1.50 packs/day for 45 years  . Types: Cigarettes  . Quit date: 12/09/2001  Smokeless tobacco  . Never Used     Comment: quit about 9 years ago    History  Alcohol Use No    Comment: Very rarely    Social History   Social History  . Marital Status: Divorced    Spouse Name: N/A  . Number of Children: N/A  . Years of Education: N/A   Occupational History  . retired Hydrologist    Social History Main Topics  . Smoking status: Former Smoker -- 1.50 packs/day for 45 years    Types: Cigarettes    Quit date: 12/09/2001  . Smokeless tobacco: Never Used     Comment: quit about 9 years ago  . Alcohol Use: No     Comment: Very rarely  . Drug Use: No  . Sexual Activity: Not Currently   Other Topics Concern  . Not on file   Social History Narrative    Allergies  Allergen Reactions  . Codeine Other (See Comments)    Does not know     Current Facility-Administered Medications  Medication Dose Route Frequency Provider Last Rate Last Dose  . 0.9 %  sodium chloride infusion  250 mL Intravenous PRN Jettie Booze, MD      . acetaminophen (TYLENOL) tablet 650 mg  650 mg Oral Q4H PRN Jettie Booze, MD      . ALPRAZolam Duanne Moron) tablet 0.25-0.5 mg  0.25-0.5 mg Oral Q4H PRN Ivin Poot, MD      . amLODipine (NORVASC) tablet 5 mg  5 mg Oral Daily Jettie Booze, MD   5 mg at 08/10/15 1051  . aspirin EC tablet 81 mg  81 mg Oral Daily Jettie Booze, MD   81 mg at 08/10/15 1051  . chlorhexidine (HIBICLENS) 4 % liquid 4 application  60 mL Topical Once Ivin Poot, MD       And  . Derrill Memo ON 08/11/2015] chlorhexidine (HIBICLENS) 4 % liquid 4 application  60 mL Topical Once Ivin Poot, MD      . Derrill Memo ON 08/11/2015] diazepam (VALIUM) tablet 5 mg  5 mg Oral Once Ivin Poot, MD      . Derrill Memo ON 08/11/2015] metoprolol tartrate (LOPRESSOR) tablet 12.5 mg  12.5 mg Oral Once Ivin Poot, MD      . nitroGLYCERIN (NITROSTAT) SL tablet 0.4 mg  0.4 mg Sublingual Q5 Min x 3 PRN Jettie Booze, MD      . ondansetron [facility]) injection 4 mg  4 mg Intravenous Q6H  PRN Jettie Booze, MD      . pantoprazole (PROTONIX) EC tablet 40 mg  40 mg Oral Daily Jettie Booze, MD   [phone]  mg at 08/10/15 1051  . sodium chloride 0.9 % injection 3 mL  3 mL Intravenous Q12H Jettie Booze, MD      . sodium chloride 0.9 % injection 3 mL  3 mL Intravenous PRN Jettie Booze, MD      . temazepam (RESTORIL) capsule 15 mg  15 mg Oral Once PRN Ivin Poot, MD        Prescriptions prior to admission  Medication Sig Dispense Refill Last Dose  . amLODipine (NORVASC) 5 MG tablet Take 5 mg by mouth daily.    08/09/2015 at 0800  . aspirin EC 81 MG tablet Take 81 mg by mouth daily.   08/09/2015 at 0800  . dexlansoprazole (DEXILANT) 60 MG capsule Take 1 capsule (60 mg total) by mouth daily. 30 capsule 11 08/09/2015 at 0800  . MECLIZINE HCL PO Take by mouth.   t at 0800  . pantoprazole (PROTONIX) 40 MG tablet Take 40 mg by mouth daily.   08/09/2015 at 0800  . nitroGLYCERIN (NITROSTAT) 0.4 MG SL tablet Place 0.4 mg under the tongue every 5 (five) minutes x 3 doses as needed for chest pain (up to 3 doses).   Unknown at Unknown time    Family History  Problem Relation Age of Onset  . Pneumonia Father     Deceased age 68  . Heart disease Mother     Deceased age 10  . Breast cancer Sister     2 sisters  . Esophageal cancer Brother   . Diabetes Mother   . Colon cancer Neg Hx      Review of Systems:       Cardiac Review of Systems: Y or N  Chest Pain [  yes  ]  Resting SOB [ no  ] Exertional SOB  [ yes ]  Orthopnea [ no ]   Pedal Edema [ no  ]    Palpitations [  no] Syncope  [no  ]   Presyncope [ no  ]  General Review of Systems: [Y] = yes [  ]=no Constitional: recent weight change [  ]; anorexia [  ]; fatigue [  ]; nausea [  ]; night sweats [  ]; fever [ no ]; or chills [  ]                                                               Dental: poor dentition[  ]; Last Dentist visit: Once a year  Eye : blurred vision [  ]; diplopia [   ]; vision changes [   ];  Amaurosis fugax[  ]; Resp: cough [  ];  wheezing[  ];  hemoptysis[  ]; shortness of breath[  ]; paroxysmal nocturnal dyspnea[  ]; dyspnea on exertion[  ]; or orthopnea[  ];  GI:  gallstones[  ], vomiting[  ];  dysphagia[  ]; melena[  ];  hematochezia [  ]; heartburn[  ];   Hx of  Colonoscopy[  ]; GU: kidney stones [  ]; hematuria[  ];   dysuria [  ];  nocturia[  ];  history of     obstruction [  ]; urinary frequency [  ]             Skin: rash, swelling[  ];,  hair loss[  ];  peripheral edema[  ];  or itching[  ]; Musculosketetal: myalgias[yes-related to statins  ];  joint swelling[  ];  joint erythema[  ];  joint pain[  ];  back pain[  ];  Heme/Lymph: bruising[  ];  bleeding[  ];  anemia[  ];  Neuro: TIA[no-carotid duplex exam unremarkable  ];  headaches[  ];  stroke[  ];  vertigo[  ];  seizures[  ];   paresthesias[  ];  difficulty walking[  ];  Psych:depression[  ]; anxiety[  ];  Endocrine: diabetes[no  ];  thyroid dysfunction[  ];  Immunizations: Flu [  ]; Pneumococcal[  ];  Other: Right-hand dominant                          No prior history of thoracic trauma  Physical Exam: BP 141/76 mmHg  Pulse 63  Temp(Src) 97.7 F (36.5 C) (Oral)  Resp 18  Ht 5\' 9"  (1.753 m)  Wt 240 lb (108.863 kg)  BMI 35.43 kg/m2  SpO2 94%        Physical Exam  General: Obese middle-aged Caucasian male no acute distress HEENT: Normocephalic pupils equal , dentition adequate Neck: Supple without JVD, adenopathy, or bruit Chest: Clear to auscultation, symmetrical breath sounds, no rhonchi, no tenderness             or deformity Cardiovascular: Regular rate and rhythm, no murmur, no gallop, peripheral pulses             palpable in all extremities Abdomen:  Soft, nontender, no palpable mass or organomegaly Extremities: Warm, well-perfused, no clubbing cyanosis edema or tenderness,              no venous stasis changes of the legs Rectal/GU: Deferred Neuro: Grossly non--focal and symmetrical  throughout Skin: Clean and dry without rash or ulceration   Diagnostic Studies & Laboratory data:   Coronary angiograms personally reviewed and results discussed with patient and family.  Recent Radiology Findings:   Dg Chest 2 View  08/10/2015   CLINICAL DATA:  Shortness of breath.  Preoperative.  EXAM: CHEST  2 VIEW  COMPARISON:  02/28/2012 chest radiograph  FINDINGS: The heart is top-normal size. The mediastinal contour is stable with minimal tortuosity of the atherosclerotic thoracic aorta. No pneumothorax. No pleural effusion. Clear lungs, with no pulmonary edema. Moderate degenerative changes in the thoracic spine.  IMPRESSION: No active disease in the chest.   Electronically Signed   By: Ilona Sorrel M.D.   On: 08/10/2015 08:04     I have independently reviewed the above radiologic studies.  Recent Lab Findings: Lab Results  Component Value Date   WBC 7.3 08/09/2015   HGB 15.0 08/09/2015   HCT 44.3 08/09/2015   PLT 153 08/09/2015   GLUCOSE 112* 08/09/2015   CHOL 101 05/13/2012   TRIG 122.0 05/13/2012   HDL 31.00* 05/13/2012   LDLCALC 46 05/13/2012   ALT 20 05/13/2012   AST 26 05/13/2012   NA 140 08/09/2015   K 3.9 08/09/2015   CL 106 08/09/2015   CREATININE 0.98 08/09/2015   BUN 14 08/09/2015   CO2 28 08/09/2015   INR 1.05 08/09/2015      Assessment / Plan:     Unstable angina with severe multivessel coronary disease Preserved LV function Obesity Statin intolerance GERD Hypertension   Plan multivessel CABG on September 2. Procedure indications benefits and risks discussed in detail with patient and family.  He agrees to proceed with surgery.    @ME1 @ 08/10/2015 1:35 PM

## 2015-08-10 NOTE — Progress Notes (Signed)
*  PRELIMINARY RESULTS* Echocardiogram 2D Echocardiogram has been performed.  Leavy Cella 08/10/2015, 2:01 PM

## 2015-08-10 NOTE — Progress Notes (Signed)
Patient: [place] Mejia / Admit Date: 08/09/2015 / Date of Encounter: 08/10/2015, 8:58 AM   Subjective: No further chest pain. Feels well.   Objective: Telemetry: NSR Physical Exam: Blood pressure 127/71, pulse 63, temperature 97.7 F (36.5 C), temperature source Oral, resp. rate 18, height 5\' 9"  (1.753 m), weight 240 lb (108.863 kg), SpO2 94 %. General: Well developed, well nourished WM in no acute distress. Head: Normocephalic, atraumatic, sclera non-icteric, no xanthomas, nares are without discharge. Neck: Negative for carotid bruits. JVP not elevated. Lungs: Clear bilaterally to auscultation without wheezes, rales, or rhonchi. Breathing is unlabored. Heart: RRR S1 S2 without murmurs, rubs, or gallops.  Abdomen: Soft, non-tender, non-distended with normoactive bowel sounds. No rebound/guarding. Extremities: No clubbing or cyanosis. No edema. Distal pedal pulses are 2+ and equal bilaterally. R groin cath site without hematoma or bruit - mild isolated ecchymosis on side of cath site towards groin site. Neuro: Alert and oriented X 3. Moves all extremities spontaneously. Psych:  Responds to questions appropriately with a normal affect.   Intake/Output Summary (Last 24 hours) at 08/10/15 0858 Last data filed at 08/10/15 0745  Gross per 24 hour  Intake    600 ml  Output    100 ml  Net    500 ml    Inpatient Medications:  . amLODipine  5 mg Oral Daily  . aspirin EC  81 mg Oral Daily  . chlorhexidine  60 mL Topical Once   And  . [START ON 08/11/2015] chlorhexidine  60 mL Topical Once  . [START ON 08/11/2015] diazepam  5 mg Oral Once  . [START ON 08/11/2015] metoprolol tartrate  12.5 mg Oral Once  . pantoprazole  40 mg Oral Daily  . sodium chloride  3 mL Intravenous Q12H   Infusions:    Labs:  Recent Labs  08/09/15 1030  NA 140  K 3.9  CL 106  CO2 28  GLUCOSE 112*  BUN 14  CREATININE 0.98  CALCIUM 9.1   No results for input(s): AST, ALT, ALKPHOS, BILITOT, PROT, ALBUMIN in  the last 72 hours.  Recent Labs  08/09/15 1030  WBC 7.3  HGB 15.0  HCT 44.3  MCV 83.3  PLT 153   No results for input(s): CKTOTAL, CKMB, TROPONINI in the last 72 hours. Invalid input(s): POCBNP No results for input(s): HGBA1C in the last 72 hours.   Radiology/Studies:  Dg Chest 2 View  08/10/2015   CLINICAL DATA:  Shortness of breath.  Preoperative.  EXAM: CHEST  2 VIEW  COMPARISON:  02/28/2012 chest radiograph  FINDINGS: The heart is top-normal size. The mediastinal contour is stable with minimal tortuosity of the atherosclerotic thoracic aorta. No pneumothorax. No pleural effusion. Clear lungs, with no pulmonary edema. Moderate degenerative changes in the thoracic spine.  IMPRESSION: No active disease in the chest.   Electronically Signed   By: Ilona Sorrel M.D.   On: 08/10/2015 08:04     Assessment and Plan  73M with HLD, GERD, HTN, CAD s/p DES to RCA in 2013 with residual disease managed medically who presented to [facility] for planned cath given recent progressive exertional angina. Cath 08/09/15 with patent stents but severe disease in mid LAD a bifurcation with a large diagona, up to 90% stenosis past the origin of the diagonal in the main LAD, 80% ostial diagonal stenosis, severe disease in OM1 and moderate disease OM2, normal EF 55-65%. Seen by TCTS and plan for CABG Fri 08/11/15.  1. CAD with recent progressive angina as  above - plan CABG tomorrow per TCTS. Continue aspirin. He says he is not on statin due to history of multiple statin intolerances. May benefit from lipid clinic referral at d/c to discuss PCSK-9 inhibitor. Check lipids/CMET in AM. BB previously stopped due to dyspnea but there was no change when this was stopped. Will d/w Dr. Claiborne Billings whether it would be worthwhile to add very low dose BB as inpatient - baseline HR mostly 60s. I told the patient to notify us if he has recurrent pain at which time we may need to add heparin on board. Will hold off given that he is  asymptomatic.  2. HTN - currently controlled.  3. HLD - as above.  4. Obesity Body mass index is 35.43 kg/(m^2). - would benefit from cardiac rehab after discharge.  Signed, Melina Copa PA-C Pager: [phone]   Patient seen and examined. Agree with assessment and plan. Echo EF 50 - 55% and Gr 1 dd. No chest pain. For CABG tomorrow.   [place] Sine, MD, [facility] 08/10/2015 3:51 PM

## 2015-08-10 NOTE — Care Management Note (Signed)
Case Management Note   Patient Details  Name: JAMEIRE KOUBA MRN: [id number] Date of Birth: 1936/04/30  Subjective/Objective:   Pt admitted s/p cath with severe vessel diease found- plan for CABG 08/11/15 am                 Action/Plan: PTA pt lived at home- NCM to follow post op for d/c needs  Expected Discharge Date:                  Expected Discharge Plan:     In-House Referral:     Discharge planning Services  CM Consult  Post Acute Care Choice:    Choice offered to:     DME Arranged:    DME Agency:     HH Arranged:    HH Agency:     Status of Service:  In process, will continue to follow  Medicare Important Message Given:    Date Medicare IM Given:    Medicare IM give by:    Date Additional Medicare IM Given:    Additional Medicare Important Message give by:     If discussed at [place] of Stay Meetings, dates discussed:    Additional Comments:  Dawayne Patricia, RN 08/10/2015, 3:11 PM

## 2015-08-10 NOTE — Progress Notes (Addendum)
CARDIAC REHAB PHASE I   PRE:  Rate/Rhythm: 73 SR  BP:  Sitting: 141/76        SaO2: 94 RA  MODE:  Ambulation: 550 ft   POST:  Rate/Rhythm: 77 SR  BP:  Sitting: 151/78         SaO2: 96 RA  Pt ambulated 550 ft on RA, independent, steady gait, tolerated well. Pt denies CP, dizziness, DOE, declined rest stop. Pt did appear somewhat short of breath, denies. Pre-op OHS education done, reviewed IS, sternal precautions, activity progression, cardiac surgery book and OHS guidelines. Pt verbalized understanding. Pt to recliner after walk, call bell within reach, watching preparing for cardiac surgery video. Will follow for post-op order.  [id number]  Lenna Sciara, RN, BSN 08/10/2015 11:31 AM

## 2015-08-10 NOTE — Progress Notes (Signed)
Utilization review completed.  

## 2015-08-10 NOTE — Progress Notes (Signed)
Pre-op Cardiac Surgery  Carotid Findings:  Minimal plaque noted, 1-39% plaquing, bilaterally.  Vertebral artery flow is antegrade.   Upper Extremity Right Left  Brachial Pressures 135T 140T  Radial Waveforms T T  Ulnar Waveforms T T  Palmar Arch (Allen's Test) WNL WNL   Findings:      Lower  Extremity Right Left  Dorsalis Pedis    Anterior Tibial    Posterior Tibial    Ankle/Brachial Indices      Findings:

## 2015-08-11 ENCOUNTER — Encounter (HOSPITAL_COMMUNITY)
Admission: RE | Disposition: A | Discharge status: Skilled Nursing Facility | Payer: Medicare Other | Source: Ambulatory Visit | Attending: Cardiothoracic Surgery

## 2015-08-11 ENCOUNTER — Inpatient Hospital Stay (HOSPITAL_COMMUNITY): Payer: Medicare Other

## 2015-08-11 ENCOUNTER — Inpatient Hospital Stay (HOSPITAL_COMMUNITY): Payer: Medicare Other | Admitting: Certified Registered Nurse Anesthetist

## 2015-08-11 DIAGNOSIS — Z951 Presence of aortocoronary bypass graft: Secondary | ICD-10-CM

## 2015-08-11 HISTORY — PX: CORONARY ARTERY BYPASS GRAFT: SHX141

## 2015-08-11 HISTORY — PX: TEE WITHOUT CARDIOVERSION: SHX5443

## 2015-08-11 LAB — POCT I-STAT, CHEM 8
BUN: 15 mg/dL (ref 6–20)
BUN: 12 mg/dL (ref 6–20)
BUN: 14 mg/dL (ref 6–20)
BUN: 13 mg/dL (ref 6–20)
BUN: 13 mg/dL (ref 6–20)
BUN: 13 mg/dL (ref 6–20)
BUN: 14 mg/dL (ref 6–20)
CALCIUM ION: 1.2 mmol/L (ref 1.13–1.30)
CALCIUM ION: 1.02 mmol/L — AB (ref 1.13–1.30)
CALCIUM ION: 1.06 mmol/L — AB (ref 1.13–1.30)
CHLORIDE: 98 mmol/L — AB (ref 101–111)
CHLORIDE: 98 mmol/L — AB (ref 101–111)
CHLORIDE: 101 mmol/L (ref 101–111)
Calcium, Ion: 1.21 mmol/L (ref 1.13–1.30)
Calcium, Ion: 1.02 mmol/L — ABNORMAL LOW (ref 1.13–1.30)
Calcium, Ion: 0.99 mmol/L — ABNORMAL LOW (ref 1.13–1.30)
Calcium, Ion: 1.16 mmol/L (ref 1.13–1.30)
Chloride: 103 mmol/L (ref 101–111)
Chloride: 103 mmol/L (ref 101–111)
Chloride: 95 mmol/L — ABNORMAL LOW (ref 101–111)
Chloride: 103 mmol/L (ref 101–111)
Creatinine, Ser: 0.8 mg/dL (ref 0.61–1.24)
Creatinine, Ser: 0.7 mg/dL (ref 0.61–1.24)
Creatinine, Ser: 0.7 mg/dL (ref 0.61–1.24)
Creatinine, Ser: 0.7 mg/dL (ref 0.61–1.24)
Creatinine, Ser: 0.7 mg/dL (ref 0.61–1.24)
Creatinine, Ser: 0.7 mg/dL (ref 0.61–1.24)
Creatinine, Ser: 1 mg/dL (ref 0.61–1.24)
GLUCOSE: 150 mg/dL — AB (ref 65–99)
Glucose, Bld: 114 mg/dL — ABNORMAL HIGH (ref 65–99)
Glucose, Bld: 104 mg/dL — ABNORMAL HIGH (ref 65–99)
Glucose, Bld: 121 mg/dL — ABNORMAL HIGH (ref 65–99)
Glucose, Bld: 138 mg/dL — ABNORMAL HIGH (ref 65–99)
Glucose, Bld: 158 mg/dL — ABNORMAL HIGH (ref 65–99)
Glucose, Bld: 145 mg/dL — ABNORMAL HIGH (ref 65–99)
HCT: 39 % (ref 39.0–52.0)
HCT: 28 % — ABNORMAL LOW (ref 39.0–52.0)
HCT: 29 % — ABNORMAL LOW (ref 39.0–52.0)
HEMATOCRIT: 41 % (ref 39.0–52.0)
HEMATOCRIT: 26 % — AB (ref 39.0–52.0)
HEMATOCRIT: 32 % — AB (ref 39.0–52.0)
HEMATOCRIT: 30 % — AB (ref 39.0–52.0)
HEMOGLOBIN: 13.9 g/dL (ref 13.0–17.0)
HEMOGLOBIN: 13.3 g/dL (ref 13.0–17.0)
HEMOGLOBIN: 8.8 g/dL — AB (ref 13.0–17.0)
HEMOGLOBIN: 9.5 g/dL — AB (ref 13.0–17.0)
HEMOGLOBIN: 9.9 g/dL — AB (ref 13.0–17.0)
HEMOGLOBIN: 10.9 g/dL — AB (ref 13.0–17.0)
HEMOGLOBIN: 10.2 g/dL — AB (ref 13.0–17.0)
POTASSIUM: 4.3 mmol/L (ref 3.5–5.1)
POTASSIUM: 5.5 mmol/L — AB (ref 3.5–5.1)
POTASSIUM: 4.5 mmol/L (ref 3.5–5.1)
POTASSIUM: 3.9 mmol/L (ref 3.5–5.1)
Potassium: 3.8 mmol/L (ref 3.5–5.1)
Potassium: 4.2 mmol/L (ref 3.5–5.1)
Potassium: 5.2 mmol/L — ABNORMAL HIGH (ref 3.5–5.1)
SODIUM: 137 mmol/L (ref 135–145)
SODIUM: 130 mmol/L — AB (ref 135–145)
SODIUM: 138 mmol/L (ref 135–145)
SODIUM: 130 mmol/L — AB (ref 135–145)
SODIUM: 130 mmol/L — AB (ref 135–145)
SODIUM: 135 mmol/L (ref 135–145)
SODIUM: 139 mmol/L (ref 135–145)
TCO2: 28 mmol/L (ref 0–100)
TCO2: 23 mmol/L (ref 0–100)
TCO2: 27 mmol/L (ref 0–100)
TCO2: 24 mmol/L (ref 0–100)
TCO2: 25 mmol/L (ref 0–100)
TCO2: 24 mmol/L (ref 0–100)
TCO2: 23 mmol/L (ref 0–100)

## 2015-08-11 LAB — POCT I-STAT 3, VENOUS BLOOD GAS (G3P V)
Acid-base deficit: 2 mmol/L (ref 0.0–2.0)
Bicarbonate: 22.8 mEq/L (ref 20.0–24.0)
O2 SAT: 78 %
PCO2 VEN: 33.2 mmHg — AB (ref 45.0–50.0)
PO2 VEN: 37 mmHg (ref 30.0–45.0)
Patient temperature: 35.1
TCO2: 24 mmol/L (ref 0–100)
pH, Ven: 7.437 — ABNORMAL HIGH (ref 7.250–7.300)

## 2015-08-11 LAB — POCT I-STAT 3, ART BLOOD GAS (G3+)
ACID-BASE DEFICIT: 2 mmol/L (ref 0.0–2.0)
ACID-BASE DEFICIT: 2 mmol/L (ref 0.0–2.0)
ACID-BASE EXCESS: 2 mmol/L (ref 0.0–2.0)
Acid-Base Excess: 1 mmol/L (ref 0.0–2.0)
Acid-base deficit: 4 mmol/L — ABNORMAL HIGH (ref 0.0–2.0)
BICARBONATE: 24.3 meq/L — AB (ref 20.0–24.0)
BICARBONATE: 22.7 meq/L (ref 20.0–24.0)
BICARBONATE: 24.2 meq/L — AB (ref 20.0–24.0)
Bicarbonate: 25.7 mEq/L — ABNORMAL HIGH (ref 20.0–24.0)
Bicarbonate: 25.8 mEq/L — ABNORMAL HIGH (ref 20.0–24.0)
O2 Saturation: 100 %
O2 Saturation: 100 %
O2 Saturation: 91 %
O2 Saturation: 90 %
O2 Saturation: 90 %
PCO2 ART: 34.9 mmHg — AB (ref 35.0–45.0)
PCO2 ART: 43.4 mmHg (ref 35.0–45.0)
PH ART: 7.475 — AB (ref 7.350–7.450)
PH ART: 7.383 (ref 7.350–7.450)
PH ART: 7.316 — AB (ref 7.350–7.450)
PH ART: 7.311 — AB (ref 7.350–7.450)
PO2 ART: 292 mmHg — AB (ref 80.0–100.0)
PO2 ART: 65 mmHg — AB (ref 80.0–100.0)
Patient temperature: 36
TCO2: 27 mmol/L (ref 0–100)
TCO2: 27 mmol/L (ref 0–100)
TCO2: 26 mmol/L (ref 0–100)
TCO2: 24 mmol/L (ref 0–100)
TCO2: 26 mmol/L (ref 0–100)
pCO2 arterial: 47.4 mmHg — ABNORMAL HIGH (ref 35.0–45.0)
pCO2 arterial: 44.1 mmHg (ref 35.0–45.0)
pCO2 arterial: 47.8 mmHg — ABNORMAL HIGH (ref 35.0–45.0)
pH, Arterial: 7.312 — ABNORMAL LOW (ref 7.350–7.450)
pO2, Arterial: 374 mmHg — ABNORMAL HIGH (ref 80.0–100.0)
pO2, Arterial: 62 mmHg — ABNORMAL LOW (ref 80.0–100.0)
pO2, Arterial: 63 mmHg — ABNORMAL LOW (ref 80.0–100.0)

## 2015-08-11 LAB — HEMOGLOBIN AND HEMATOCRIT, BLOOD
HCT: 28.1 % — ABNORMAL LOW (ref 39.0–52.0)
Hemoglobin: 9.6 g/dL — ABNORMAL LOW (ref 13.0–17.0)

## 2015-08-11 LAB — CBC
HCT: 43.5 % (ref 39.0–52.0)
HCT: 33.4 % — ABNORMAL LOW (ref 39.0–52.0)
HCT: 30.8 % — ABNORMAL LOW (ref 39.0–52.0)
Hemoglobin: 14.4 g/dL (ref 13.0–17.0)
Hemoglobin: 11.4 g/dL — ABNORMAL LOW (ref 13.0–17.0)
Hemoglobin: 10.7 g/dL — ABNORMAL LOW (ref 13.0–17.0)
MCH: 27.6 pg (ref 26.0–34.0)
MCH: 28.4 pg (ref 26.0–34.0)
MCH: 28.8 pg (ref 26.0–34.0)
MCHC: 33.1 g/dL (ref 30.0–36.0)
MCHC: 34.1 g/dL (ref 30.0–36.0)
MCHC: 34.7 g/dL (ref 30.0–36.0)
MCV: 83.5 fL (ref 78.0–100.0)
MCV: 83.1 fL (ref 78.0–100.0)
MCV: 83 fL (ref 78.0–100.0)
PLATELETS: 129 10*3/uL — AB (ref 150–400)
Platelets: 155 10*3/uL (ref 150–400)
Platelets: 123 10*3/uL — ABNORMAL LOW (ref 150–400)
RBC: 5.21 MIL/uL (ref 4.22–5.81)
RBC: 4.02 MIL/uL — ABNORMAL LOW (ref 4.22–5.81)
RBC: 3.71 MIL/uL — ABNORMAL LOW (ref 4.22–5.81)
RDW: 13 % (ref 11.5–15.5)
RDW: 13 % (ref 11.5–15.5)
RDW: 13.1 % (ref 11.5–15.5)
WBC: 7.7 10*3/uL (ref 4.0–10.5)
WBC: 18.3 10*3/uL — ABNORMAL HIGH (ref 4.0–10.5)
WBC: 15.9 10*3/uL — ABNORMAL HIGH (ref 4.0–10.5)

## 2015-08-11 LAB — POCT I-STAT 4, (NA,K, GLUC, HGB,HCT)
Glucose, Bld: 170 mg/dL — ABNORMAL HIGH (ref 65–99)
HCT: 34 % — ABNORMAL LOW (ref 39.0–52.0)
HEMOGLOBIN: 11.6 g/dL — AB (ref 13.0–17.0)
POTASSIUM: 4.2 mmol/L (ref 3.5–5.1)
Sodium: 137 mmol/L (ref 135–145)

## 2015-08-11 LAB — LIPID PANEL
CHOL/HDL RATIO: 5.1 ratio
CHOLESTEROL: 138 mg/dL (ref 0–200)
HDL: 27 mg/dL — ABNORMAL LOW (ref >40)
LDL Cholesterol: 85 mg/dL (ref 0–99)
TRIGLYCERIDES: 128 mg/dL (ref <150)
VLDL: 26 mg/dL (ref 0–40)

## 2015-08-11 LAB — HEMOGLOBIN A1C
Hgb A1c MFr Bld: 6 % — ABNORMAL HIGH (ref 4.8–5.6)
Hgb A1c MFr Bld: 6 % — ABNORMAL HIGH (ref 4.8–5.6)
Mean Plasma Glucose: 126 mg/dL
Mean Plasma Glucose: 126 mg/dL

## 2015-08-11 LAB — COMPREHENSIVE METABOLIC PANEL
ALBUMIN: 3.5 g/dL (ref 3.5–5.0)
ALT: 22 U/L (ref 17–63)
ANION GAP: 9 (ref 5–15)
AST: 25 U/L (ref 15–41)
Alkaline Phosphatase: 66 U/L (ref 38–126)
BUN: 15 mg/dL (ref 6–20)
CO2: 26 mmol/L (ref 22–32)
Calcium: 9 mg/dL (ref 8.9–10.3)
Chloride: 103 mmol/L (ref 101–111)
Creatinine, Ser: 1.06 mg/dL (ref 0.61–1.24)
GFR calc Af Amer: >60 mL/min (ref >60)
GFR calc non Af Amer: >60 mL/min (ref >60)
GLUCOSE: 114 mg/dL — AB (ref 65–99)
POTASSIUM: 3.4 mmol/L — AB (ref 3.5–5.1)
SODIUM: 138 mmol/L (ref 135–145)
Total Bilirubin: 1.6 mg/dL — ABNORMAL HIGH (ref 0.3–1.2)
Total Protein: 6.2 g/dL — ABNORMAL LOW (ref 6.5–8.1)

## 2015-08-11 LAB — MAGNESIUM: Magnesium: 2.6 mg/dL — ABNORMAL HIGH (ref 1.7–2.4)

## 2015-08-11 LAB — POCT I-STAT GLUCOSE
GLUCOSE: 120 mg/dL — AB (ref 65–99)
Operator id: 3390

## 2015-08-11 LAB — CREATININE, SERUM
Creatinine, Ser: 1.16 mg/dL (ref 0.61–1.24)
GFR calc Af Amer: >60 mL/min (ref >60)
GFR calc non Af Amer: 59 mL/min — ABNORMAL LOW (ref >60)

## 2015-08-11 LAB — PROTIME-INR
INR: 1.51 — ABNORMAL HIGH (ref 0.00–1.49)
PROTHROMBIN TIME: 18.3 s — AB (ref 11.6–15.2)

## 2015-08-11 LAB — APTT: aPTT: 31 seconds (ref 24–37)

## 2015-08-11 LAB — PLATELET COUNT: Platelets: 124 10*3/uL — ABNORMAL LOW (ref 150–400)

## 2015-08-11 SURGERY — CORONARY ARTERY BYPASS GRAFTING (CABG)
Anesthesia: General | Site: Chest

## 2015-08-11 MED ORDER — DOPAMINE-DEXTROSE 3.2-5 MG/ML-% IV SOLN
0.0000 ug/kg/min | INTRAVENOUS | Status: DC
  Administered 2015-08-13: 3 ug/kg/min via INTRAVENOUS
  Filled 2015-08-11: qty 250

## 2015-08-11 MED ORDER — SODIUM CHLORIDE 0.9 % IJ SOLN
3.0000 mL | Freq: Two times a day (BID) | INTRAMUSCULAR | Status: DC
  Administered 2015-08-12 – 2015-08-16 (×8): 3 mL via INTRAVENOUS

## 2015-08-11 MED ORDER — SODIUM CHLORIDE 0.45 % IV SOLN
INTRAVENOUS | Status: DC | PRN
  Administered 2015-08-11: 20 mL/h via INTRAVENOUS

## 2015-08-11 MED ORDER — PANTOPRAZOLE SODIUM 40 MG PO TBEC
40.0000 mg | DELAYED_RELEASE_TABLET | Freq: Every day | ORAL | Status: DC
  Administered 2015-08-13 – 2015-08-16 (×4): 40 mg via ORAL
  Filled 2015-08-11 (×4): qty 1

## 2015-08-11 MED ORDER — MIDAZOLAM HCL 5 MG/5ML IJ SOLN
INTRAMUSCULAR | Status: DC | PRN
  Administered 2015-08-11 (×2): 1 mg via INTRAVENOUS
  Administered 2015-08-11: 3 mg via INTRAVENOUS
  Administered 2015-08-11: 4 mg via INTRAVENOUS
  Administered 2015-08-11: 1 mg via INTRAVENOUS

## 2015-08-11 MED ORDER — DOCUSATE SODIUM 100 MG PO CAPS
200.0000 mg | ORAL_CAPSULE | Freq: Every day | ORAL | Status: DC
  Administered 2015-08-12 – 2015-08-24 (×8): 200 mg via ORAL
  Filled 2015-08-11 (×10): qty 2

## 2015-08-11 MED ORDER — DOPAMINE-DEXTROSE 3.2-5 MG/ML-% IV SOLN
INTRAVENOUS | Status: DC | PRN
  Administered 2015-08-11: 3 ug/kg/min via INTRAVENOUS

## 2015-08-11 MED ORDER — SODIUM CHLORIDE 0.9 % IV SOLN
10.0000 g | INTRAVENOUS | Status: DC | PRN
  Administered 2015-08-11: 5 g/h via INTRAVENOUS

## 2015-08-11 MED ORDER — LACTATED RINGERS IV SOLN: INTRAVENOUS | Status: DC

## 2015-08-11 MED ORDER — FENTANYL CITRATE (PF) 250 MCG/5ML IJ SOLN
INTRAMUSCULAR | Status: AC
  Filled 2015-08-11: qty 5

## 2015-08-11 MED ORDER — CHLORHEXIDINE GLUCONATE 0.12 % MT SOLN: 15.0000 mL | Freq: Two times a day (BID) | OROMUCOSAL | Status: DC

## 2015-08-11 MED ORDER — SUCCINYLCHOLINE 20MG/ML (10ML) SYRINGE FOR MEDFUSION PUMP - OPTIME
INTRAMUSCULAR | Status: DC | PRN
  Administered 2015-08-11: 120 mg via INTRAVENOUS

## 2015-08-11 MED ORDER — FENTANYL CITRATE (PF) 100 MCG/2ML IJ SOLN
INTRAMUSCULAR | Status: DC | PRN
  Administered 2015-08-11: 250 ug via INTRAVENOUS
  Administered 2015-08-11: 200 ug via INTRAVENOUS
  Administered 2015-08-11 (×3): 250 ug via INTRAVENOUS
  Administered 2015-08-11: 50 ug via INTRAVENOUS

## 2015-08-11 MED ORDER — STERILE WATER FOR IRRIGATION IR SOLN
Status: DC | PRN
  Administered 2015-08-11: 1000 mL

## 2015-08-11 MED ORDER — ALBUMIN HUMAN 5 % IV SOLN
250.0000 mL | INTRAVENOUS | Status: AC | PRN
  Administered 2015-08-11 (×2): 250 mL via INTRAVENOUS

## 2015-08-11 MED ORDER — ACETAMINOPHEN 650 MG RE SUPP
650.0000 mg | Freq: Once | RECTAL | Status: AC
  Administered 2015-08-11: 650 mg via RECTAL

## 2015-08-11 MED ORDER — MIDAZOLAM HCL 10 MG/2ML IJ SOLN
INTRAMUSCULAR | Status: AC
Start: 2015-08-11 — End: 2015-08-11
  Filled 2015-08-11: qty 4

## 2015-08-11 MED ORDER — MAGNESIUM SULFATE 4 GM/100ML IV SOLN
4.0000 g | Freq: Once | INTRAVENOUS | Status: AC
  Administered 2015-08-11: 4 g via INTRAVENOUS
  Filled 2015-08-11: qty 100

## 2015-08-11 MED ORDER — FAMOTIDINE IN NACL 20-0.9 MG/50ML-% IV SOLN
20.0000 mg | Freq: Two times a day (BID) | INTRAVENOUS | Status: AC
  Administered 2015-08-11 (×2): 20 mg via INTRAVENOUS
  Filled 2015-08-11: qty 50

## 2015-08-11 MED ORDER — PHENYLEPHRINE HCL 10 MG/ML IJ SOLN
0.0000 ug/min | INTRAVENOUS | Status: DC
  Administered 2015-08-11: 70 ug/min via INTRAVENOUS
  Administered 2015-08-12: 10 ug/min via INTRAVENOUS
  Administered 2015-08-12: 50 ug/min via INTRAVENOUS
  Filled 2015-08-11 (×4): qty 2

## 2015-08-11 MED ORDER — SODIUM CHLORIDE 0.9 % IJ SOLN
3.0000 mL | INTRAMUSCULAR | Status: DC | PRN
  Administered 2015-08-12 – 2015-08-13 (×2): 3 mL via INTRAVENOUS
  Filled 2015-08-11 (×2): qty 3

## 2015-08-11 MED ORDER — PROPOFOL 10 MG/ML IV BOLUS
INTRAVENOUS | Status: DC | PRN
  Administered 2015-08-11: 70 mg via INTRAVENOUS

## 2015-08-11 MED ORDER — MIDAZOLAM HCL 2 MG/2ML IJ SOLN: 2.0000 mg | INTRAMUSCULAR | Status: DC | PRN

## 2015-08-11 MED ORDER — NITROGLYCERIN IN D5W 200-5 MCG/ML-% IV SOLN: 0.0000 ug/min | INTRAVENOUS | Status: DC

## 2015-08-11 MED ORDER — OXYCODONE HCL 5 MG PO TABS
5.0000 mg | ORAL_TABLET | ORAL | Status: DC | PRN
  Administered 2015-08-12 – 2015-08-13 (×5): 10 mg via ORAL
  Administered 2015-08-13: 5 mg via ORAL
  Administered 2015-08-13 – 2015-08-16 (×7): 10 mg via ORAL
  Filled 2015-08-11 (×4): qty 2
  Filled 2015-08-11: qty 1
  Filled 2015-08-11 (×8): qty 2

## 2015-08-11 MED ORDER — INSULIN REGULAR BOLUS VIA INFUSION
0.0000 [IU] | Freq: Three times a day (TID) | INTRAVENOUS | Status: DC
  Filled 2015-08-11: qty 10

## 2015-08-11 MED ORDER — ASPIRIN 81 MG PO CHEW
324.0000 mg | CHEWABLE_TABLET | Freq: Every day | ORAL | Status: DC
  Administered 2015-08-18 – 2015-08-21 (×2): 324 mg
  Filled 2015-08-11 (×2): qty 4

## 2015-08-11 MED ORDER — VANCOMYCIN HCL IN DEXTROSE 1-5 GM/200ML-% IV SOLN
1000.0000 mg | Freq: Once | INTRAVENOUS | Status: AC
  Administered 2015-08-11: 1000 mg via INTRAVENOUS
  Filled 2015-08-11: qty 200

## 2015-08-11 MED ORDER — PHENYLEPHRINE HCL 10 MG/ML IJ SOLN
10.0000 mg | INTRAVENOUS | Status: DC | PRN
  Administered 2015-08-11: 20 ug/min via INTRAVENOUS

## 2015-08-11 MED ORDER — ACETAMINOPHEN 160 MG/5ML PO SOLN: 650.0000 mg | Freq: Once | ORAL | Status: AC

## 2015-08-11 MED ORDER — SODIUM CHLORIDE 0.9 % IV SOLN
250.0000 [IU] | INTRAVENOUS | Status: DC | PRN
  Administered 2015-08-11: 1 [IU]/h via INTRAVENOUS

## 2015-08-11 MED ORDER — MORPHINE SULFATE (PF) 2 MG/ML IV SOLN: 1.0000 mg | INTRAVENOUS | Status: AC | PRN

## 2015-08-11 MED ORDER — MILRINONE IN DEXTROSE 20 MG/100ML IV SOLN
0.3750 ug/kg/min | INTRAVENOUS | Status: AC
  Administered 2015-08-11: .35 ug/kg/min via INTRAVENOUS
  Filled 2015-08-11: qty 100

## 2015-08-11 MED ORDER — BISACODYL 10 MG RE SUPP
10.0000 mg | Freq: Every day | RECTAL | Status: DC
  Administered 2015-08-18: 10 mg via RECTAL
  Filled 2015-08-11: qty 1

## 2015-08-11 MED ORDER — DEXMEDETOMIDINE HCL IN NACL 200 MCG/50ML IV SOLN
0.0000 ug/kg/h | INTRAVENOUS | Status: DC
  Administered 2015-08-11: 0.7 ug/kg/h via INTRAVENOUS
  Administered 2015-08-12: 0.2 ug/kg/h via INTRAVENOUS
  Filled 2015-08-11 (×2): qty 50

## 2015-08-11 MED ORDER — POTASSIUM CHLORIDE 10 MEQ/50ML IV SOLN: 10.0000 meq | INTRAVENOUS | Status: AC

## 2015-08-11 MED ORDER — HEMOSTATIC AGENTS (NO CHARGE) OPTIME
TOPICAL | Status: DC | PRN
  Administered 2015-08-11: 1 via TOPICAL

## 2015-08-11 MED ORDER — SODIUM CHLORIDE 0.9 % IJ SOLN
OROMUCOSAL | Status: DC | PRN
  Administered 2015-08-11: 12 mL via TOPICAL

## 2015-08-11 MED ORDER — ROCURONIUM BROMIDE 50 MG/5ML IV SOLN
INTRAVENOUS | Status: AC
  Filled 2015-08-11: qty 2

## 2015-08-11 MED ORDER — ACETAMINOPHEN 500 MG PO TABS
1000.0000 mg | ORAL_TABLET | Freq: Four times a day (QID) | ORAL | Status: AC
  Administered 2015-08-12 – 2015-08-15 (×17): 1000 mg via ORAL
  Filled 2015-08-11 (×22): qty 2

## 2015-08-11 MED ORDER — SODIUM CHLORIDE 0.9 % IV SOLN
200.0000 ug | INTRAVENOUS | Status: DC | PRN
  Administered 2015-08-11: 0.2 ug/kg/h via INTRAVENOUS

## 2015-08-11 MED ORDER — INSULIN REGULAR HUMAN 100 UNIT/ML IJ SOLN
INTRAMUSCULAR | Status: DC
  Filled 2015-08-11 (×2): qty 2.5

## 2015-08-11 MED ORDER — METOPROLOL TARTRATE 12.5 MG HALF TABLET
12.5000 mg | ORAL_TABLET | Freq: Two times a day (BID) | ORAL | Status: DC
  Administered 2015-08-12 – 2015-08-24 (×15): 12.5 mg via ORAL
  Filled 2015-08-11 (×31): qty 1

## 2015-08-11 MED ORDER — LACTATED RINGERS IV SOLN
INTRAVENOUS | Status: DC | PRN
  Administered 2015-08-11 (×5): via INTRAVENOUS

## 2015-08-11 MED ORDER — CETYLPYRIDINIUM CHLORIDE 0.05 % MT LIQD
7.0000 mL | Freq: Two times a day (BID) | OROMUCOSAL | Status: DC
  Administered 2015-08-13 – 2015-08-21 (×14): 7 mL via OROMUCOSAL

## 2015-08-11 MED ORDER — LIDOCAINE HCL (CARDIAC) 20 MG/ML IV SOLN
INTRAVENOUS | Status: DC | PRN
  Administered 2015-08-11: 100 mg via INTRAVENOUS

## 2015-08-11 MED ORDER — BISACODYL 5 MG PO TBEC
10.0000 mg | DELAYED_RELEASE_TABLET | Freq: Every day | ORAL | Status: DC
  Administered 2015-08-12 – 2015-08-21 (×6): 10 mg via ORAL
  Filled 2015-08-11 (×7): qty 2

## 2015-08-11 MED ORDER — HEPARIN SODIUM (PORCINE) 1000 UNIT/ML IJ SOLN
INTRAMUSCULAR | Status: AC
  Filled 2015-08-11: qty 1

## 2015-08-11 MED ORDER — CHLORHEXIDINE GLUCONATE 0.12 % MT SOLN: 15.0000 mL | Freq: Once | OROMUCOSAL | Status: DC

## 2015-08-11 MED ORDER — METOCLOPRAMIDE HCL 5 MG/ML IJ SOLN
10.0000 mg | Freq: Four times a day (QID) | INTRAMUSCULAR | Status: AC
  Administered 2015-08-11 – 2015-08-12 (×3): 10 mg via INTRAVENOUS
  Filled 2015-08-11 (×3): qty 2

## 2015-08-11 MED ORDER — DEXTROSE 5 % IV SOLN
1.5000 g | Freq: Two times a day (BID) | INTRAVENOUS | Status: AC
  Administered 2015-08-12 – 2015-08-13 (×4): 1.5 g via INTRAVENOUS
  Filled 2015-08-11 (×4): qty 1.5

## 2015-08-11 MED ORDER — ROCURONIUM BROMIDE 100 MG/10ML IV SOLN
INTRAVENOUS | Status: DC | PRN
  Administered 2015-08-11 (×2): 100 mg via INTRAVENOUS

## 2015-08-11 MED ORDER — ONDANSETRON HCL 4 MG/2ML IJ SOLN
4.0000 mg | Freq: Four times a day (QID) | INTRAMUSCULAR | Status: DC | PRN
  Administered 2015-08-13 – 2015-08-16 (×3): 4 mg via INTRAVENOUS
  Filled 2015-08-11 (×3): qty 2

## 2015-08-11 MED ORDER — MILRINONE IN DEXTROSE 20 MG/100ML IV SOLN
0.3000 ug/kg/min | INTRAVENOUS | Status: DC
  Administered 2015-08-11: 0.3 ug/kg/min via INTRAVENOUS
  Filled 2015-08-11 (×2): qty 100

## 2015-08-11 MED ORDER — HEPARIN SODIUM (PORCINE) 1000 UNIT/ML IJ SOLN
INTRAMUSCULAR | Status: DC | PRN
  Administered 2015-08-11: 10 mL via INTRAVENOUS
  Administered 2015-08-11: 3 mL via INTRAVENOUS
  Administered 2015-08-11: 2 mL via INTRAVENOUS

## 2015-08-11 MED ORDER — ASPIRIN EC 325 MG PO TBEC
325.0000 mg | DELAYED_RELEASE_TABLET | Freq: Every day | ORAL | Status: DC
  Administered 2015-08-12 – 2015-08-24 (×9): 325 mg via ORAL
  Filled 2015-08-11 (×14): qty 1

## 2015-08-11 MED ORDER — TRAMADOL HCL 50 MG PO TABS
50.0000 mg | ORAL_TABLET | ORAL | Status: DC | PRN
  Administered 2015-08-12 (×2): 100 mg via ORAL
  Administered 2015-08-13: 50 mg via ORAL
  Administered 2015-08-14: 100 mg via ORAL
  Filled 2015-08-11: qty 1
  Filled 2015-08-11 (×3): qty 2

## 2015-08-11 MED ORDER — MORPHINE SULFATE (PF) 2 MG/ML IV SOLN
2.0000 mg | INTRAVENOUS | Status: DC | PRN
  Administered 2015-08-11 – 2015-08-12 (×4): 2 mg via INTRAVENOUS
  Administered 2015-08-12 (×2): 4 mg via INTRAVENOUS
  Administered 2015-08-13: 2 mg via INTRAVENOUS
  Filled 2015-08-11 (×4): qty 1
  Filled 2015-08-11: qty 2
  Filled 2015-08-11: qty 1
  Filled 2015-08-11: qty 2

## 2015-08-11 MED ORDER — ALBUMIN HUMAN 5 % IV SOLN
INTRAVENOUS | Status: DC | PRN
  Administered 2015-08-11 (×2): via INTRAVENOUS

## 2015-08-11 MED ORDER — 0.9 % SODIUM CHLORIDE (POUR BTL) OPTIME
TOPICAL | Status: DC | PRN
  Administered 2015-08-11: 4000 mL
  Administered 2015-08-11: 1000 mL

## 2015-08-11 MED ORDER — SODIUM CHLORIDE 0.9 % IV SOLN
INTRAVENOUS | Status: DC
  Administered 2015-08-11: 20 mL via INTRAVENOUS
  Administered 2015-08-12: 20 mL/h via INTRAVENOUS

## 2015-08-11 MED ORDER — METOPROLOL TARTRATE 25 MG/10 ML ORAL SUSPENSION
12.5000 mg | Freq: Two times a day (BID) | ORAL | Status: DC
  Administered 2015-08-12 – 2015-08-18 (×2): 12.5 mg
  Filled 2015-08-11 (×25): qty 5

## 2015-08-11 MED ORDER — PROPOFOL 10 MG/ML IV BOLUS
INTRAVENOUS | Status: AC
  Filled 2015-08-11: qty 20

## 2015-08-11 MED ORDER — PROTAMINE SULFATE 10 MG/ML IV SOLN
INTRAVENOUS | Status: DC | PRN
  Administered 2015-08-11: 50 mg via INTRAVENOUS
  Administered 2015-08-11 (×2): 40 mg via INTRAVENOUS
  Administered 2015-08-11 (×2): 50 mg via INTRAVENOUS
  Administered 2015-08-11: 30 mg via INTRAVENOUS
  Administered 2015-08-11: 50 mg via INTRAVENOUS

## 2015-08-11 MED ORDER — METOPROLOL TARTRATE 1 MG/ML IV SOLN: 2.5000 mg | INTRAVENOUS | Status: DC | PRN

## 2015-08-11 MED ORDER — ACETAMINOPHEN 160 MG/5ML PO SOLN
1000.0000 mg | Freq: Four times a day (QID) | ORAL | Status: AC
  Filled 2015-08-11: qty 40

## 2015-08-11 MED ORDER — SODIUM CHLORIDE 0.9 % IV SOLN
250.0000 mL | INTRAVENOUS | Status: DC
  Administered 2015-08-12: 250 mL via INTRAVENOUS

## 2015-08-11 MED ORDER — LACTATED RINGERS IV SOLN
500.0000 mL | Freq: Once | INTRAVENOUS | Status: DC | PRN
Start: 2015-08-11 — End: 2015-08-12

## 2015-08-11 MED FILL — Electrolyte-R (PH 7.4) Solution: INTRAVENOUS | Qty: 5000 | Status: AC

## 2015-08-11 MED FILL — Heparin Sodium (Porcine) Inj 1000 Unit/ML: INTRAMUSCULAR | Qty: 10 | Status: AC

## 2015-08-11 MED FILL — Lidocaine HCl IV Inj 20 MG/ML: INTRAVENOUS | Qty: 5 | Status: AC

## 2015-08-11 MED FILL — Sodium Bicarbonate IV Soln 8.4%: INTRAVENOUS | Qty: 50 | Status: AC

## 2015-08-11 MED FILL — Mannitol IV Soln 20%: INTRAVENOUS | Qty: 500 | Status: AC

## 2015-08-11 MED FILL — Sodium Chloride IV Soln 0.9%: INTRAVENOUS | Qty: 2000 | Status: AC

## 2015-08-11 MED FILL — Heparin Sodium (Porcine) Inj 1000 Unit/ML: INTRAMUSCULAR | Qty: 30 | Status: AC

## 2015-08-11 SURGICAL SUPPLY — 105 items
ADAPTER CARDIO PERF ANTE/RETRO (ADAPTER) ×4 IMPLANT
ADH SKN CLS LQ APL DERMABOND (GAUZE/BANDAGES/DRESSINGS) ×4
ADPR PRFSN 84XANTGRD RTRGD (ADAPTER) ×2
APPLICATOR COTTON TIP 6IN STRL (MISCELLANEOUS) ×2 IMPLANT
BAG DECANTER FOR FLEXI CONT (MISCELLANEOUS) ×4 IMPLANT
BANDAGE ELASTIC 4 VELCRO ST LF (GAUZE/BANDAGES/DRESSINGS) ×8 IMPLANT
BANDAGE ELASTIC 6 VELCRO ST LF (GAUZE/BANDAGES/DRESSINGS) ×8 IMPLANT
BASKET HEART  (ORDER IN 25'S) (MISCELLANEOUS) ×1
BASKET HEART (ORDER IN 25'S) (MISCELLANEOUS) ×1
BASKET HEART (ORDER IN 25S) (MISCELLANEOUS) ×2 IMPLANT
BLADE STERNUM SYSTEM 6 (BLADE) ×4 IMPLANT
BLADE SURG 12 STRL SS (BLADE) ×4 IMPLANT
BLADE SURG ROTATE 9660 (MISCELLANEOUS) IMPLANT
BNDG GAUZE ELAST 4 BULKY (GAUZE/BANDAGES/DRESSINGS) ×8 IMPLANT
CANISTER SUCTION 2500CC (MISCELLANEOUS) ×4 IMPLANT
CANNULA GUNDRY RCSP 15FR (MISCELLANEOUS) ×4 IMPLANT
CATH CPB KIT VANTRIGT (MISCELLANEOUS) ×4 IMPLANT
CATH ROBINSON RED A/P 18FR (CATHETERS) ×12 IMPLANT
CATH THORACIC 36FR RT ANG (CATHETERS) ×4 IMPLANT
CLIP FOGARTY SPRING 6M (CLIP) ×2 IMPLANT
CLIP TI WIDE RED SMALL 24 (CLIP) ×4 IMPLANT
COVER SURGICAL LIGHT HANDLE (MISCELLANEOUS) ×4 IMPLANT
CRADLE DONUT ADULT HEAD (MISCELLANEOUS) ×4 IMPLANT
DERMABOND ADHESIVE PROPEN (GAUZE/BANDAGES/DRESSINGS) ×4
DERMABOND ADVANCED .7 DNX6 (GAUZE/BANDAGES/DRESSINGS) IMPLANT
DRAIN CHANNEL 32F RND 10.7 FF (WOUND CARE) ×4 IMPLANT
DRAPE CARDIOVASCULAR INCISE (DRAPES) ×4
DRAPE SLUSH/WARMER DISC (DRAPES) ×4 IMPLANT
DRAPE SRG 135X102X78XABS (DRAPES) ×2 IMPLANT
DRSG AQUACEL AG ADV 3.5X14 (GAUZE/BANDAGES/DRESSINGS) ×4 IMPLANT
ELECT BLADE 4.0 EZ CLEAN MEGAD (MISCELLANEOUS) ×4
ELECT BLADE 6.5 EXT (BLADE) ×4 IMPLANT
ELECT CAUTERY BLADE 6.4 (BLADE) ×4 IMPLANT
ELECT REM PT RETURN 9FT ADLT (ELECTROSURGICAL) ×8
ELECTRODE BLDE 4.0 EZ CLN MEGD (MISCELLANEOUS) ×2 IMPLANT
ELECTRODE REM PT RTRN 9FT ADLT (ELECTROSURGICAL) ×4 IMPLANT
GAUZE SPONGE 4X4 12PLY STRL (GAUZE/BANDAGES/DRESSINGS) ×10 IMPLANT
GLOVE BIO SURGEON STRL SZ7.5 (GLOVE) ×12 IMPLANT
GOWN STRL REUS W/ TWL LRG LVL3 (GOWN DISPOSABLE) ×8 IMPLANT
GOWN STRL REUS W/TWL LRG LVL3 (GOWN DISPOSABLE) ×16
HEMOSTAT POWDER SURGIFOAM 1G (HEMOSTASIS) ×12 IMPLANT
HEMOSTAT SURGICEL 2X14 (HEMOSTASIS) ×4 IMPLANT
INSERT FOGARTY XLG (MISCELLANEOUS) IMPLANT
KIT BASIN OR (CUSTOM PROCEDURE TRAY) ×4 IMPLANT
KIT ROOM TURNOVER OR (KITS) ×4 IMPLANT
KIT SUCTION CATH 14FR (SUCTIONS) ×4 IMPLANT
KIT VASOVIEW W/TROCAR VH 2000 (KITS) ×4 IMPLANT
LEAD PACING MYOCARDI (MISCELLANEOUS) ×4 IMPLANT
MARKER GRAFT CORONARY BYPASS (MISCELLANEOUS) ×12 IMPLANT
NS IRRIG 1000ML POUR BTL (IV SOLUTION) ×20 IMPLANT
PACK OPEN HEART (CUSTOM PROCEDURE TRAY) ×4 IMPLANT
PAD ARMBOARD 7.5X6 YLW CONV (MISCELLANEOUS) ×8 IMPLANT
PAD ELECT DEFIB RADIOL ZOLL (MISCELLANEOUS) ×4 IMPLANT
PENCIL BUTTON HOLSTER BLD 10FT (ELECTRODE) ×4 IMPLANT
PUNCH AORTIC ROT 4.0MM RCL 40 (MISCELLANEOUS) ×2 IMPLANT
PUNCH AORTIC ROTATE 4.0MM (MISCELLANEOUS) IMPLANT
PUNCH AORTIC ROTATE 4.5MM 8IN (MISCELLANEOUS) IMPLANT
PUNCH AORTIC ROTATE 5MM 8IN (MISCELLANEOUS) IMPLANT
SET CARDIOPLEGIA MPS 5001102 (MISCELLANEOUS) ×2 IMPLANT
SOLUTION ANTI FOG 6CC (MISCELLANEOUS) ×2 IMPLANT
SPONGE GAUZE 4X4 12PLY STER LF (GAUZE/BANDAGES/DRESSINGS) ×6 IMPLANT
SPONGE LAP 4X18 X RAY DECT (DISPOSABLE) ×2 IMPLANT
SURGIFLO W/THROMBIN 8M KIT (HEMOSTASIS) ×4 IMPLANT
SUT BONE WAX W31G (SUTURE) ×4 IMPLANT
SUT CHROMIC 6 0 CE2 363 13 (SUTURE) ×4 IMPLANT
SUT ETHIBOND 2 0 SH (SUTURE) ×16
SUT ETHIBOND 2 0 SH 36X2 (SUTURE) IMPLANT
SUT MNCRL AB 4-0 PS2 18 (SUTURE) ×4 IMPLANT
SUT PROLENE 3 0 SH DA (SUTURE) IMPLANT
SUT PROLENE 3 0 SH1 36 (SUTURE) IMPLANT
SUT PROLENE 4 0 RB 1 (SUTURE) ×8
SUT PROLENE 4 0 SH DA (SUTURE) ×4 IMPLANT
SUT PROLENE 4-0 RB1 .5 CRCL 36 (SUTURE) ×2 IMPLANT
SUT PROLENE 5 0 C 1 36 (SUTURE) ×4 IMPLANT
SUT PROLENE 6 0 C 1 30 (SUTURE) ×4 IMPLANT
SUT PROLENE 6 0 CC (SUTURE) ×14 IMPLANT
SUT PROLENE 8 0 BV175 6 (SUTURE) ×14 IMPLANT
SUT PROLENE BLUE 7 0 (SUTURE) ×10 IMPLANT
SUT SILK  1 MH (SUTURE) ×6
SUT SILK 1 MH (SUTURE) IMPLANT
SUT SILK 1 TIES 10X30 (SUTURE) ×2 IMPLANT
SUT SILK 2 0 SH CR/8 (SUTURE) ×6 IMPLANT
SUT SILK 2 0 TIES 10X30 (SUTURE) ×2 IMPLANT
SUT SILK 2 0 TIES 17X18 (SUTURE) ×4
SUT SILK 2-0 18XBRD TIE BLK (SUTURE) IMPLANT
SUT SILK 3 0 SH CR/8 (SUTURE) ×2 IMPLANT
SUT SILK 4 0 TIE 10X30 (SUTURE) ×4 IMPLANT
SUT STEEL 6MS V (SUTURE) ×10 IMPLANT
SUT STEEL SZ 6 DBL 3X14 BALL (SUTURE) ×4 IMPLANT
SUT TEM PAC WIRE 2 0 SH (SUTURE) ×8 IMPLANT
SUT VIC AB 1 CTX 36 (SUTURE) ×8
SUT VIC AB 1 CTX36XBRD ANBCTR (SUTURE) ×4 IMPLANT
SUT VIC AB 2-0 CT1 27 (SUTURE) ×4
SUT VIC AB 2-0 CT1 TAPERPNT 27 (SUTURE) IMPLANT
SUT VIC AB 2-0 CTX 27 (SUTURE) IMPLANT
SUT VIC AB 3-0 X1 27 (SUTURE) IMPLANT
SUTURE E-PAK OPEN HEART (SUTURE) ×4 IMPLANT
SYSTEM SAHARA CHEST DRAIN ATS (WOUND CARE) ×4 IMPLANT
TAPE CLOTH SOFT 2X10 (GAUZE/BANDAGES/DRESSINGS) ×2 IMPLANT
TOWEL OR 17X24 6PK STRL BLUE (TOWEL DISPOSABLE) ×8 IMPLANT
TOWEL OR 17X26 10 PK STRL BLUE (TOWEL DISPOSABLE) ×8 IMPLANT
TRAY FOLEY IC TEMP SENS 16FR (CATHETERS) ×4 IMPLANT
TUBING INSUFFLATION (TUBING) ×4 IMPLANT
UNDERPAD 30X30 INCONTINENT (UNDERPADS AND DIAPERS) ×4 IMPLANT
WATER STERILE IRR 1000ML POUR (IV SOLUTION) ×8 IMPLANT

## 2015-08-11 NOTE — Progress Notes (Signed)
      [address]       [place] [address]             [phone]      S/p CABG x 4  Just extubated  BP 131/54 mmHg  Pulse 93  Temp(Src) 97.3 F (36.3 C) (Oral)  Resp 16  Ht 5\' 9"  (1.753 m)  Wt 234 lb 9.1 oz (106.4 kg)  BMI 34.62 kg/m2  SpO2 95%  CI= 2.7   Intake/Output Summary (Last 24 hours) at 08/11/15 1849 Last data filed at 08/11/15 1800  Gross per 24 hour  Intake 7007.71 ml  Output   4860 ml  Net 2147.71 ml    Doing well early postop  Remo Lipps C. Roxan Hockey, MD [facility] [phone]

## 2015-08-11 NOTE — Anesthesia Procedure Notes (Addendum)
Procedure Name: Intubation Date/Time: 08/11/2015 8:07 AM Performed by: Ollen Bowl Pre-anesthesia Checklist: Patient identified, Emergency Drugs available, Suction available, Patient being monitored and Timeout performed Patient Re-evaluated:Patient Re-evaluated prior to inductionOxygen Delivery Method: Circle system utilized and Simple face mask Preoxygenation: Pre-oxygenation with 100% oxygen Intubation Type: IV induction Ventilation: Mask ventilation without difficulty and Oral airway inserted - appropriate to patient size Laryngoscope Size: Miller and 3 Grade View: Grade I Tube type: Subglottic suction tube Tube size: 7.5 mm Number of attempts: 1 Airway Equipment and Method: Patient positioned with wedge pillow and Stylet Placement Confirmation: ETT inserted through vocal cords under direct vision,  positive ETCO2 and breath sounds checked- equal and bilateral Secured at: 22 cm Tube secured with: Tape Dental Injury: Teeth and Oropharynx as per pre-operative assessment     Procedures: Right IJ Gordy Councilman Catheter Insertion: RIJ [id number]: The patient was identified and consent obtained.  TO was performed, and full barrier precautions were used.  The skin was anesthetized with lidocaine-4cc plain with 25g needle.  Once the vein was located with the 22 ga. needle using ultrasound guidance , the wire was inserted into the vein.  The wire location was confirmed with ultrasound.  The tissue was dilated and the 8.5 [place] cordis catheter was carefully inserted. Afterwards Gordy Councilman catheter was inserted. PA catheter at 45cm.  The patient tolerated the procedure well.   CE

## 2015-08-11 NOTE — OR Nursing (Signed)
SICU Call: Ist call: [phone]; 2nd call:1301; 3rd call: 1339 and final call at [place]

## 2015-08-11 NOTE — Brief Op Note (Signed)
08/09/2015 - 08/11/2015  12:05 PM  PATIENT:  Tyler Mejia  79 y.o. male  PRE-OPERATIVE DIAGNOSIS:  SEVERE CAD  POST-OPERATIVE DIAGNOSIS:  SEVERE CAD  PROCEDURE:  Procedure(s):  CORONARY ARTERY BYPASS GRAFTING x 4 -LIMA to LAD -SVG to DIAGONAL -SVG to OM1 -SVG to OM2  ENDOSCOPIC HARVEST GREATER SAPHENOUS VEIN  -Right Thigh -Left Thigh to Mid calf  TRANSESOPHAGEAL ECHOCARDIOGRAM (TEE) (N/A)  SURGEON:  Surgeon(s) and Role:    * Ivin Poot, MD - Primary  PHYSICIAN ASSISTANT: Ellwood Handler PA-C  ANESTHESIA:   general  EBL:  Total I/O In: 1000 [I.V.:1000] Out: 575 [Urine:575]  BLOOD ADMINISTERED: CELLSAVER and 1 FFP  DRAINS: Left Pleural Chest Tube, Mediastinal Chest Drains   LOCAL MEDICATIONS USED:  NONE  SPECIMEN:  No Specimen  DISPOSITION OF SPECIMEN:  N/A  COUNTS:  YES  TOURNIQUET:  * No tourniquets in log *  DICTATION: .Dragon Dictation  PLAN OF CARE: Admit to inpatient   PATIENT DISPOSITION:  ICU - intubated and hemodynamically stable.   Delay start of Pharmacological VTE agent (>24hrs) due to surgical blood loss or risk of bleeding: yes

## 2015-08-11 NOTE — Anesthesia Postprocedure Evaluation (Signed)
  Anesthesia Post-op Note  Patient: Tyler Mejia  Procedure(s) Performed: Procedure(s): CORONARY ARTERY BYPASS GRAFTING (CABG) X4 UTILIZING THE LEFT INTERNAL MAMMARY ARTERY TO LAD AND ENDOSCOPICALLY HARVESTED BILATERAL SAPHENEOUS VEINS TO DIAGONAL,OM1 AND OM2 (N/A) TRANSESOPHAGEAL ECHOCARDIOGRAM (TEE) (N/A)  Patient Location: PACU and SICU  Anesthesia Type:General  Level of Consciousness: Patient remains intubated per anesthesia plan  Airway and Oxygen Therapy: Patient remains intubated per anesthesia plan  Post-op Pain: mild  Post-op Assessment: Post-op Vital signs reviewed              Post-op Vital Signs: Reviewed  Last Vitals:  Filed Vitals:   08/11/15 1815  BP: 111/65  Pulse: 90  Temp: 36.3 C  Resp: 13    Complications: No apparent anesthesia complications

## 2015-08-11 NOTE — Anesthesia Preprocedure Evaluation (Signed)
Anesthesia Evaluation  Patient identified by MRN, date of birth, ID band Patient awake    Reviewed: Allergy & Precautions, NPO status , Patient's Chart, lab work & pertinent test results, reviewed documented beta blocker date and time   Airway Mallampati: III  TM Distance: <3 FB Neck ROM: Full    Dental  (+) Partial Upper, Partial Lower, Dental Advisory Given   Pulmonary shortness of breath, COPD breath sounds clear to auscultation        Cardiovascular hypertension, Pt. on medications and Pt. on home beta blockers + CAD and + Past MI Rhythm:Regular     Neuro/Psych    GI/Hepatic hiatal hernia, GERD-  Medicated,  Endo/Other    Renal/GU      Musculoskeletal   Abdominal   Peds  Hematology   Anesthesia Other Findings   Reproductive/Obstetrics                             Anesthesia Physical Anesthesia Plan  ASA: IV  Anesthesia Plan: General   Post-op Pain Management:    Induction: Intravenous  Airway Management Planned: Oral ETT  Additional Equipment: Arterial line, CVP, PA Cath, TEE and Ultrasound Guidance Line Placement  Intra-op Plan:   Post-operative Plan: Post-operative intubation/ventilation  Informed Consent: I have reviewed the patients History and Physical, chart, labs and discussed the procedure including the risks, benefits and alternatives for the proposed anesthesia with the patient or authorized representative who has indicated his/her understanding and acceptance.     Plan Discussed with: CRNA and Anesthesiologist  Anesthesia Plan Comments:         Anesthesia Quick Evaluation

## 2015-08-11 NOTE — Progress Notes (Signed)
  Echocardiogram Echocardiogram Transesophageal has been performed.  Bobbye Charleston 08/11/2015, 8:44 AM

## 2015-08-11 NOTE — Transfer of Care (Signed)
Immediate Anesthesia Transfer of Care Note  Patient: Tyler Mejia  Procedure(s) Performed: Procedure(s): CORONARY ARTERY BYPASS GRAFTING (CABG) X4 UTILIZING THE LEFT INTERNAL MAMMARY ARTERY TO LAD AND ENDOSCOPICALLY HARVESTED BILATERAL SAPHENEOUS VEINS TO DIAGONAL,OM1 AND OM2 (N/A) TRANSESOPHAGEAL ECHOCARDIOGRAM (TEE) (N/A)  Patient Location: SICU  Anesthesia Type:General  Level of Consciousness: Patient remains intubated per anesthesia plan  Airway & Oxygen Therapy: Patient remains intubated per anesthesia plan and Patient placed on Ventilator (see vital sign flow sheet for setting)  Post-op Assessment: Report given to RN and Post -op Vital signs reviewed and stable  Post vital signs: Reviewed and stable  Last Vitals:  Filed Vitals:   08/11/15 0355  BP: 130/70  Pulse: 69  Temp: 36.6 C  Resp: 18    Complications: No apparent anesthesia complications

## 2015-08-11 NOTE — Progress Notes (Signed)
The patient was examined and preop studies reviewed. There has been no change from the prior exam and the patient is ready for surgery.  Plan CABG on [employer]

## 2015-08-11 NOTE — Progress Notes (Signed)
Attempted to cal Anest. To give report and they were unable to receive report at this time.

## 2015-08-11 NOTE — Procedures (Signed)
Extubation Procedure Note  Patient Details:   Name: Tyler Mejia DOB: [birth year] MRN: [id number]   Airway Documentation:     Evaluation  O2 sats: stable throughout Complications: No apparent complications Patient did tolerate procedure well. Bilateral Breath Sounds: Clear   Yes   Pt. Was extubated to a 4L [place] with RN at the bedside without any complications, dyspnea or stridor noted. Pt. Achieved a goal of -30 on NIF & 1L on VC. Pt. Was instructed on IS x 10, highest goal reached was 619mL.  Claretta Fraise 08/11/2015, 6:27 PM

## 2015-08-11 NOTE — Progress Notes (Signed)
Daughter present and dentures and glasses given to her.

## 2015-08-11 NOTE — Op Note (Signed)
Tyler Mejia, Tyler Mejia NO.:  [id number]  MEDICAL RECORD NO.:  [id number]  LOCATION:  2S14C                        FACILITY:  [facility]  PHYSICIAN:  Ivin Poot, M.D.  DATE OF BIRTH:  10-08-1936  DATE OF PROCEDURE: DATE OF DISCHARGE:                              OPERATIVE REPORT   OPERATION: 1. Coronary artery bypass grafting x4 (left internal mammary artery to     LAD, saphenous vein graft to diagonal, saphenous vein graft to OM1,     and  saphenous vein graft to OM2). 2. Bilateral endoscopic harvest of saphenous vein.  SURGEON:  Ivin Poot, M.D.  ASSISTANT:  Ellwood Handler, PA-C.  PREOPERATIVE DIAGNOSIS:  Unstable angina, severe multivessel coronary artery disease.  POSTOPERATIVE DIAGNOSIS:  Unstable angina, severe multivessel coronary artery disease.  ANESTHESIA:  General by Dr. Finis Bud.  INDICATIONS:  The patient is a 79 year old, obese, nondiabetic male, who presents with unstable angina.  Four years ago, he had a PCI with stent placed to the RCA.  Cardiac catheterization at this time shows the RCA stent to be patent without other significant disease.  However, the LAD diagonal and circumflex vessels all have high-grade greater than 90% stenosis.  LV function was preserved.  The patient is an operative candidate for surgical coronary revascularization.  I examined the patient in his hospital room and reviewed the results of cardiac cath with the patient and his family.  I discussed the indications, expected benefits of coronary artery bypass grafting for treatment of his severe coronary artery disease.  I reviewed the major details of surgery, including the use of general anesthesia and cardiopulmonary bypass, the location of the surgical incisions, and the expected postoperative hospital recovery.  I discussed with the patient the risks to him of coronary artery bypass surgery, including the risks of stroke, bleeding, blood transfusion  requirement, MI, postoperative lung problems including pleural effusion, infection, and death.  After reviewing these issues, he demonstrated his understanding and agreed to proceed with surgery under what I felt was an informed consent.  OPERATIVE PROCEDURE: 1. Severely diseased targets, but adequate. 2. Suboptimal vein conduit-both legs were harvested and both legs     saphenous veins were small approximately 2 mm in diameter, but     appeared to have adequate flow.  The mammary artery was a good     conduit. 3. Difficult exposure of the heart due to the patient's obese body     habitus and large amount of epicardial fat. 4. No packed cell blood transfusion is required for this surgery.  PROCEDURE IN DETAIL:  The patient was brought to the operating room and placed supine on the operating table.  General anesthesia was induced under invasive hemodynamic monitoring.  The chest, abdomen, and legs were prepped with Betadine and draped as a sterile field.  A proper time- out was performed.  A sternal incision was made as the saphenous vein was harvested endoscopically from both legs.  Vein was of suboptimal quality.  The left internal mammary artery was harvested as a pedicle graft from its origin at the subclavian vessels.  It was 1.4-mm vessel with good flow.  The sternal  retractor was placed and the pericardium was opened and suspended.  The heart was inspected.  Had a thick layer of epicardial fat.  The aorta was soft without significant calcification. Pursestrings were placed in the ascending aorta and the right atrium and heparin was administered.  When the vein was harvested and prepared and when the ACT was documented as being therapeutic.  The patient was cannulated and placed on cardiopulmonary bypass.  The coronaries were identified for grafting.  The mammary artery and vein grafts were prepared for the distal anastomoses.  Cardioplegia cannulas were placed in both  antegrade and retrograde cold blood cardioplegia.  The patient was cooled to 32 degrees.  The aortic crossclamp was applied.  One liter of cold blood cardioplegia was delivered in split dose, between the antegrade aortic and retrograde coronary sinus catheters.  There was good cardioplegic arrest.  Septal temperature remained less than 15 degrees.  The distal coronary anastomoses were performed.  The first distal anastomosis was to the OM2 branch of the left circumflex.  This had a proximal 90% stenosis.  It was a 1.5-mm vessel.  A reverse saphenous vein of suboptimal size was sewn end-to-side with running 7-0 Prolene with good flow through the graft.  Cardioplegia was redosed.  The second distal anastomosis was placed to the OM1 branch of left circumflex.  This had a proximal 90% stenosis.  A reverse saphenous vein of suboptimal size was sewn end-to-side with running 7-0 Prolene with a good flow through the graft.  Cardioplegia was redosed.  The third distal anastomosis was to the first diagonal LAD.  This was sewn with a reverse saphenous vein of 2 mm size with running 7-0 Prolene.  There was good flow through the graft.  Cardioplegia was redosed.  The fourth distal anastomosis was placed to the mid to distal third LAD, where the LAD was 1.5-mm vessel.  The left IMA pedicle was brought through an opening created in the left lateral pericardium.  This was a 1.4-mm vessel.  It was sewn end-to-side with running 8-0 Prolene.  There was good flow through the graft.  Cardioplegia was redosed.  The bulldog was re-applied and the pedicle was secured.  The epicardium with 6-0 Prolene.  Cardioplegia was redosed.  With the crossclamp was still in place, 2 proximal vein anastomoses were performed on the ascending aorta with a 4.0 mm punch running 6-0 Prolene.  The third proximal anastomosis was the OM 2 vein graft, which was quite small, which was sewn end-to-side to the proximal portion  of the OM1 vein since the OM2 vein graft was too small to sew the aorta directly.  Air was vented from the coronaries with a dose of retrograde warm blood cardioplegia.  The crossclamp was removed.  The heart resumed a spontaneous rhythm.  The vein grafts were opened and each had good flow and hemostasis was documented at the proximal distal anastomoses.  The patient was rewarmed and reperfused.  Temporary pacing wires were applied.  The lungs were expanded.  The ventilator was resumed.  The patient was weaned off cardiopulmonary bypass.  Low-dose milrinone and dopamine.  Cardiac output and hemodynamics were normal. Protamine was administered without adverse reaction.  The cannulas were removed.  The mediastinum was closed superiorly with interrupted 3-0 silks over the aorta and vein grafts.  The anterior mediastinal and left pleural chest tube were placed and brought through separate incisions. The sternum was closed with interrupted steel wire.  The pectoralis fascia was closed in  running #1 Vicryl.  The sternum had been irrigated with copious amounts of sterile saline.  The subcutaneous and skin layers were closed with a running Vicryl and sterile dressings were applied.  Total cardiopulmonary bypass time was 150 minutes     Ivin Poot, M.D.     PV/MEDQ  D:  08/11/2015  T:  08/11/2015  Job:  [id number]

## 2015-08-12 ENCOUNTER — Inpatient Hospital Stay (HOSPITAL_COMMUNITY): Payer: Medicare Other

## 2015-08-12 LAB — GLUCOSE, CAPILLARY
GLUCOSE-CAPILLARY: 105 mg/dL — AB (ref 65–99)
GLUCOSE-CAPILLARY: 108 mg/dL — AB (ref 65–99)
GLUCOSE-CAPILLARY: 110 mg/dL — AB (ref 65–99)
GLUCOSE-CAPILLARY: 112 mg/dL — AB (ref 65–99)
GLUCOSE-CAPILLARY: 117 mg/dL — AB (ref 65–99)
GLUCOSE-CAPILLARY: 119 mg/dL — AB (ref 65–99)
GLUCOSE-CAPILLARY: 127 mg/dL — AB (ref 65–99)
GLUCOSE-CAPILLARY: 136 mg/dL — AB (ref 65–99)
GLUCOSE-CAPILLARY: 152 mg/dL — AB (ref 65–99)
GLUCOSE-CAPILLARY: 163 mg/dL — AB (ref 65–99)
Glucose-Capillary: 169 mg/dL — ABNORMAL HIGH (ref 65–99)
Glucose-Capillary: 159 mg/dL — ABNORMAL HIGH (ref 65–99)
Glucose-Capillary: 141 mg/dL — ABNORMAL HIGH (ref 65–99)
Glucose-Capillary: 134 mg/dL — ABNORMAL HIGH (ref 65–99)
Glucose-Capillary: 155 mg/dL — ABNORMAL HIGH (ref 65–99)
Glucose-Capillary: 97 mg/dL (ref 65–99)
Glucose-Capillary: 110 mg/dL — ABNORMAL HIGH (ref 65–99)
Glucose-Capillary: 111 mg/dL — ABNORMAL HIGH (ref 65–99)
Glucose-Capillary: 116 mg/dL — ABNORMAL HIGH (ref 65–99)
Glucose-Capillary: 113 mg/dL — ABNORMAL HIGH (ref 65–99)
Glucose-Capillary: 104 mg/dL — ABNORMAL HIGH (ref 65–99)
Glucose-Capillary: 106 mg/dL — ABNORMAL HIGH (ref 65–99)
Glucose-Capillary: 91 mg/dL (ref 65–99)
Glucose-Capillary: 113 mg/dL — ABNORMAL HIGH (ref 65–99)

## 2015-08-12 LAB — CBC
HCT: 30.7 % — ABNORMAL LOW (ref 39.0–52.0)
HCT: 32.4 % — ABNORMAL LOW (ref 39.0–52.0)
HEMOGLOBIN: 10.9 g/dL — AB (ref 13.0–17.0)
Hemoglobin: 10.5 g/dL — ABNORMAL LOW (ref 13.0–17.0)
MCH: 28.5 pg (ref 26.0–34.0)
MCH: 28.7 pg (ref 26.0–34.0)
MCHC: 34.2 g/dL (ref 30.0–36.0)
MCHC: 33.6 g/dL (ref 30.0–36.0)
MCV: 83.4 fL (ref 78.0–100.0)
MCV: 85.3 fL (ref 78.0–100.0)
PLATELETS: 143 10*3/uL — AB (ref 150–400)
Platelets: 121 10*3/uL — ABNORMAL LOW (ref 150–400)
RBC: 3.68 MIL/uL — ABNORMAL LOW (ref 4.22–5.81)
RBC: 3.8 MIL/uL — AB (ref 4.22–5.81)
RDW: 13.1 % (ref 11.5–15.5)
RDW: 13.5 % (ref 11.5–15.5)
WBC: 15.5 10*3/uL — AB (ref 4.0–10.5)
WBC: 19.5 10*3/uL — AB (ref 4.0–10.5)

## 2015-08-12 LAB — PREPARE FRESH FROZEN PLASMA
UNIT DIVISION: 0
Unit division: 0

## 2015-08-12 LAB — CREATININE, SERUM
CREATININE: 1.94 mg/dL — AB (ref 0.61–1.24)
GFR calc non Af Amer: 32 mL/min — ABNORMAL LOW (ref >60)
GFR, EST AFRICAN AMERICAN: 37 mL/min — AB (ref >60)

## 2015-08-12 LAB — CARBOXYHEMOGLOBIN
Carboxyhemoglobin: 1.2 % (ref 0.5–1.5)
Methemoglobin: 1 % (ref 0.0–1.5)
O2 Saturation: 64.5 %
Total hemoglobin: 11.4 g/dL — ABNORMAL LOW (ref 13.5–18.0)

## 2015-08-12 LAB — BLOOD GAS, ARTERIAL
Acid-Base Excess: 0.5 mmol/L (ref 0.0–2.0)
Bicarbonate: 24.9 mEq/L — ABNORMAL HIGH (ref 20.0–24.0)
Drawn by: 252031
O2 Content: 4 L/min
O2 Saturation: 94.8 %
Patient temperature: 98.6
TCO2: 26.2 mmol/L (ref 0–100)
pCO2 arterial: 42.4 mmHg (ref 35.0–45.0)
pH, Arterial: 7.386 (ref 7.350–7.450)
pO2, Arterial: 72.1 mmHg — ABNORMAL LOW (ref 80.0–100.0)

## 2015-08-12 LAB — MAGNESIUM
Magnesium: 2.4 mg/dL (ref 1.7–2.4)
Magnesium: 2.2 mg/dL (ref 1.7–2.4)

## 2015-08-12 LAB — BASIC METABOLIC PANEL
Anion gap: 5 (ref 5–15)
BUN: 12 mg/dL (ref 6–20)
CALCIUM: 8 mg/dL — AB (ref 8.9–10.3)
CO2: 26 mmol/L (ref 22–32)
CREATININE: 1 mg/dL (ref 0.61–1.24)
Chloride: 105 mmol/L (ref 101–111)
GFR calc Af Amer: >60 mL/min (ref >60)
GLUCOSE: 117 mg/dL — AB (ref 65–99)
Potassium: 3.9 mmol/L (ref 3.5–5.1)
Sodium: 136 mmol/L (ref 135–145)

## 2015-08-12 LAB — POCT I-STAT, CHEM 8
BUN: 20 mg/dL (ref 6–20)
Calcium, Ion: 1.15 mmol/L (ref 1.13–1.30)
Chloride: 100 mmol/L — ABNORMAL LOW (ref 101–111)
Creatinine, Ser: 1.7 mg/dL — ABNORMAL HIGH (ref 0.61–1.24)
Glucose, Bld: 131 mg/dL — ABNORMAL HIGH (ref 65–99)
HEMATOCRIT: 34 % — AB (ref 39.0–52.0)
HEMOGLOBIN: 11.6 g/dL — AB (ref 13.0–17.0)
POTASSIUM: 4.8 mmol/L (ref 3.5–5.1)
SODIUM: 135 mmol/L (ref 135–145)
TCO2: 21 mmol/L (ref 0–100)

## 2015-08-12 MED ORDER — FUROSEMIDE 10 MG/ML IJ SOLN
40.0000 mg | Freq: Once | INTRAMUSCULAR | Status: AC
  Administered 2015-08-12: 40 mg via INTRAVENOUS
  Filled 2015-08-12: qty 4

## 2015-08-12 MED ORDER — INSULIN ASPART 100 UNIT/ML ~~LOC~~ SOLN: 3.0000 [IU] | Freq: Three times a day (TID) | SUBCUTANEOUS | Status: DC

## 2015-08-12 MED ORDER — INSULIN DETEMIR 100 UNIT/ML ~~LOC~~ SOLN
30.0000 [IU] | Freq: Two times a day (BID) | SUBCUTANEOUS | Status: DC
  Filled 2015-08-12 (×2): qty 0.3

## 2015-08-12 MED ORDER — METOCLOPRAMIDE HCL 5 MG/ML IJ SOLN
10.0000 mg | Freq: Four times a day (QID) | INTRAMUSCULAR | Status: DC
  Administered 2015-08-12 – 2015-08-24 (×47): 10 mg via INTRAVENOUS
  Filled 2015-08-12 (×61): qty 2

## 2015-08-12 MED ORDER — INSULIN ASPART 100 UNIT/ML ~~LOC~~ SOLN
0.0000 [IU] | SUBCUTANEOUS | Status: DC
  Administered 2015-08-12 – 2015-08-13 (×3): 2 [IU] via SUBCUTANEOUS

## 2015-08-12 MED ORDER — INSULIN DETEMIR 100 UNIT/ML ~~LOC~~ SOLN
30.0000 [IU] | Freq: Two times a day (BID) | SUBCUTANEOUS | Status: AC
  Administered 2015-08-12 (×2): 30 [IU] via SUBCUTANEOUS
  Filled 2015-08-12 (×3): qty 0.3

## 2015-08-12 MED ORDER — ENOXAPARIN SODIUM 40 MG/0.4ML ~~LOC~~ SOLN
40.0000 mg | Freq: Every day | SUBCUTANEOUS | Status: DC
  Administered 2015-08-12 – 2015-08-23 (×12): 40 mg via SUBCUTANEOUS
  Filled 2015-08-12 (×16): qty 0.4

## 2015-08-12 MED ORDER — KETOROLAC TROMETHAMINE 15 MG/ML IJ SOLN
15.0000 mg | Freq: Four times a day (QID) | INTRAMUSCULAR | Status: AC
  Administered 2015-08-12: 15 mg via INTRAVENOUS
  Filled 2015-08-12: qty 1

## 2015-08-12 MED ORDER — POTASSIUM CHLORIDE 10 MEQ/50ML IV SOLN
10.0000 meq | INTRAVENOUS | Status: AC
  Administered 2015-08-12 (×4): 10 meq via INTRAVENOUS

## 2015-08-12 NOTE — Progress Notes (Signed)
1 Day Post-Op Procedure(s) (LRB): CORONARY ARTERY BYPASS GRAFTING (CABG) X4 UTILIZING THE LEFT INTERNAL MAMMARY ARTERY TO LAD AND ENDOSCOPICALLY HARVESTED BILATERAL SAPHENEOUS VEINS TO DIAGONAL,OM1 AND OM2 (N/A) TRANSESOPHAGEAL ECHOCARDIOGRAM (TEE) (N/A) Subjective: C/o left sided pleuritic CP Denies nausea   Objective: Vital signs in last 24 hours: Temp:  [96.8 F (36 C)-100.4 F (38 C)] 100.4 F (38 C) (09/03 0600) Pulse Rate:  [89-102] 96 (09/03 0600) Cardiac Rhythm:  [-] Sinus tachycardia (09/03 0200) Resp:  [10-24] 15 (09/03 0600) BP: (94-133)/(43-88) 123/74 mmHg (09/03 0500) SpO2:  [91 %-98 %] 95 % (09/03 0600) Arterial Line BP: (96-111)/(43-60) 111/60 mmHg (09/03 0600) FiO2 (%):  [40 %-50 %] 40 % (09/02 1800) Weight:  [254 lb 3.1 oz (115.3 kg)] 254 lb 3.1 oz (115.3 kg) (09/03 0500)  Hemodynamic parameters for last 24 hours: PAP: (16-65)/(9-28) 37/26 mmHg CO:  [6.5 L/min-9.4 L/min] 7.7 L/min CI:  [2.9 L/min/m2-4.2 L/min/m2] 3.5 L/min/m2  Intake/Output from previous day: 09/02 0701 - 09/03 0700 In: 8757 [P.O.:220; I.V.:6148; Blood:1189; IV Piggyback:1200] Out: 6400 [Urine:2985; Blood:2825; Chest Tube:590] Intake/Output this shift:    General appearance: alert, cooperative and mild distress Neurologic: intact Heart: regular rate and rhythm Lungs: diminished breath sounds bibasilar Abdomen: soft, NT, normal BS  Lab Results:  Recent Labs  08/11/15 2150 08/11/15 2212 08/12/15 0315  WBC 15.9*  --  15.5*  HGB 10.7* 10.2* 10.5*  HCT 30.8* 30.0* 30.7*  PLT 123*  --  143*   BMET:  Recent Labs  08/11/15 0430  08/11/15 2212 08/12/15 0315  NA 138  < > 139 136  K 3.4*  < > 3.9 3.9  CL 103  < > 103 105  CO2 26  --   --  26  GLUCOSE 114*  < > 145* 117*  BUN 15  < > 14 12  CREATININE 1.06  < > 1.00 1.00  CALCIUM 9.0  --   --  8.0*  < > = values in this interval not displayed.  PT/INR:  Recent Labs  08/11/15 1425  LABPROT 18.3*  INR 1.51*   ABG     Component Value Date/Time   PHART 7.386 08/12/2015 0500   HCO3 24.9* 08/12/2015 0500   TCO2 26.2 08/12/2015 0500   ACIDBASEDEF 2.0 08/11/2015 1938   O2SAT 94.8 08/12/2015 0500   CBG (last 3)  No results for input(s): GLUCAP in the last 72 hours.  Assessment/Plan: S/P Procedure(s) (LRB): CORONARY ARTERY BYPASS GRAFTING (CABG) X4 UTILIZING THE LEFT INTERNAL MAMMARY ARTERY TO LAD AND ENDOSCOPICALLY HARVESTED BILATERAL SAPHENEOUS VEINS TO DIAGONAL,OM1 AND OM2 (N/A) TRANSESOPHAGEAL ECHOCARDIOGRAM (TEE) (N/A) POD # 1  CV- hemodynamics stable  Wean milrinone, continue dopamine for now   RESP- bibasilar atelectasis- IS  RENAL- creatinine OK, volume overloaded secondary to CABG   Diurese, supplement K  ENDO- CBG control better with insulin drip -transition to levemir + SSI  Anemia secondary to ABL- mild, follow  DC CT  OOB, ambulate  SCD for DVT prophylaxis, add lovenox   LOS: 3 days    Melrose Nakayama 08/12/2015

## 2015-08-13 ENCOUNTER — Inpatient Hospital Stay (HOSPITAL_COMMUNITY): Payer: Medicare Other

## 2015-08-13 LAB — GLUCOSE, CAPILLARY
GLUCOSE-CAPILLARY: 139 mg/dL — AB (ref 65–99)
GLUCOSE-CAPILLARY: 130 mg/dL — AB (ref 65–99)
GLUCOSE-CAPILLARY: 125 mg/dL — AB (ref 65–99)
GLUCOSE-CAPILLARY: 118 mg/dL — AB (ref 65–99)
Glucose-Capillary: 118 mg/dL — ABNORMAL HIGH (ref 65–99)
Glucose-Capillary: 125 mg/dL — ABNORMAL HIGH (ref 65–99)

## 2015-08-13 LAB — BASIC METABOLIC PANEL
ANION GAP: 6 (ref 5–15)
ANION GAP: 5 (ref 5–15)
BUN: 25 mg/dL — ABNORMAL HIGH (ref 6–20)
BUN: 32 mg/dL — ABNORMAL HIGH (ref 6–20)
CALCIUM: 8.1 mg/dL — AB (ref 8.9–10.3)
CHLORIDE: 100 mmol/L — AB (ref 101–111)
CO2: 26 mmol/L (ref 22–32)
CO2: 27 mmol/L (ref 22–32)
CREATININE: 1.76 mg/dL — AB (ref 0.61–1.24)
Calcium: 8 mg/dL — ABNORMAL LOW (ref 8.9–10.3)
Chloride: 100 mmol/L — ABNORMAL LOW (ref 101–111)
Creatinine, Ser: 1.93 mg/dL — ABNORMAL HIGH (ref 0.61–1.24)
GFR calc Af Amer: 37 mL/min — ABNORMAL LOW (ref >60)
GFR, EST AFRICAN AMERICAN: 42 mL/min — AB (ref >60)
GFR, EST NON AFRICAN AMERICAN: 32 mL/min — AB (ref >60)
GFR, EST NON AFRICAN AMERICAN: 36 mL/min — AB (ref >60)
Glucose, Bld: 151 mg/dL — ABNORMAL HIGH (ref 65–99)
Glucose, Bld: 148 mg/dL — ABNORMAL HIGH (ref 65–99)
POTASSIUM: 4.9 mmol/L (ref 3.5–5.1)
Potassium: 4.5 mmol/L (ref 3.5–5.1)
SODIUM: 132 mmol/L — AB (ref 135–145)
SODIUM: 132 mmol/L — AB (ref 135–145)

## 2015-08-13 LAB — CBC
HEMATOCRIT: 30.6 % — AB (ref 39.0–52.0)
HEMOGLOBIN: 10.1 g/dL — AB (ref 13.0–17.0)
MCH: 28 pg (ref 26.0–34.0)
MCHC: 33 g/dL (ref 30.0–36.0)
MCV: 84.8 fL (ref 78.0–100.0)
Platelets: 98 10*3/uL — ABNORMAL LOW (ref 150–400)
RBC: 3.61 MIL/uL — AB (ref 4.22–5.81)
RDW: 13.6 % (ref 11.5–15.5)
WBC: 13.2 10*3/uL — AB (ref 4.0–10.5)

## 2015-08-13 MED ORDER — AMIODARONE HCL IN DEXTROSE 360-4.14 MG/200ML-% IV SOLN
60.0000 mg/h | INTRAVENOUS | Status: AC
  Administered 2015-08-13 (×2): 60 mg/h via INTRAVENOUS
  Filled 2015-08-13: qty 200

## 2015-08-13 MED ORDER — AMIODARONE HCL IN DEXTROSE 360-4.14 MG/200ML-% IV SOLN
30.0000 mg/h | INTRAVENOUS | Status: DC
  Administered 2015-08-13 – 2015-08-15 (×4): 30 mg/h via INTRAVENOUS
  Filled 2015-08-13 (×8): qty 200

## 2015-08-13 MED ORDER — INSULIN ASPART 100 UNIT/ML ~~LOC~~ SOLN
0.0000 [IU] | Freq: Three times a day (TID) | SUBCUTANEOUS | Status: DC
  Administered 2015-08-13: 2 [IU] via SUBCUTANEOUS

## 2015-08-13 MED ORDER — AMIODARONE HCL IN DEXTROSE 360-4.14 MG/200ML-% IV SOLN: 30.0000 mg/h | INTRAVENOUS | Status: DC

## 2015-08-13 MED ORDER — INSULIN DETEMIR 100 UNIT/ML ~~LOC~~ SOLN
20.0000 [IU] | Freq: Two times a day (BID) | SUBCUTANEOUS | Status: DC
  Administered 2015-08-13 – 2015-08-15 (×5): 20 [IU] via SUBCUTANEOUS
  Filled 2015-08-13 (×8): qty 0.2

## 2015-08-13 MED ORDER — LEVALBUTEROL HCL 0.63 MG/3ML IN NEBU
0.6300 mg | INHALATION_SOLUTION | Freq: Four times a day (QID) | RESPIRATORY_TRACT | Status: DC | PRN
  Administered 2015-08-13 – 2015-08-23 (×5): 0.63 mg via RESPIRATORY_TRACT
  Filled 2015-08-13 (×5): qty 3

## 2015-08-13 MED ORDER — AMIODARONE HCL IN DEXTROSE 360-4.14 MG/200ML-% IV SOLN: 60.0000 mg/h | INTRAVENOUS | Status: DC

## 2015-08-13 MED ORDER — AMIODARONE LOAD VIA INFUSION
150.0000 mg | Freq: Once | INTRAVENOUS | Status: AC
  Administered 2015-08-13: 150 mg via INTRAVENOUS
  Filled 2015-08-13: qty 83.34

## 2015-08-13 MED ORDER — OXYBUTYNIN CHLORIDE ER 10 MG PO TB24
10.0000 mg | ORAL_TABLET | Freq: Every day | ORAL | Status: DC
  Administered 2015-08-13 – 2015-08-23 (×7): 10 mg via ORAL
  Filled 2015-08-13 (×15): qty 1

## 2015-08-13 MED ORDER — AMIODARONE HCL IN DEXTROSE 360-4.14 MG/200ML-% IV SOLN
INTRAVENOUS | Status: AC
  Administered 2015-08-13: 60 mg/h via INTRAVENOUS
  Filled 2015-08-13: qty 200

## 2015-08-13 MED ORDER — FUROSEMIDE 10 MG/ML IJ SOLN
40.0000 mg | Freq: Three times a day (TID) | INTRAMUSCULAR | Status: DC
  Administered 2015-08-13 – 2015-08-15 (×6): 40 mg via INTRAVENOUS
  Filled 2015-08-13 (×10): qty 4

## 2015-08-13 NOTE — Plan of Care (Signed)
Problem: Phase II - Intermediate Post-Op Goal: CBGs/Blood Glucose per SCIP Criteria Outcome: Progressing Pt transitioned off insulin drip to Q4hr CBG with SSI and Levemir dosing. CBG controlled within expected SCIP range

## 2015-08-13 NOTE — Progress Notes (Signed)
Pt ECG monitor alarmed showing Pt had converted in to Rapid Afib with a rate of 120-140's, MAP 68-72. Pt with no c/o CP or dizziness. Dr Roxan Hockey contacted r/t above hemodynamic happening. Order received to begin Amiodarone protocol with loading bolus. Will continue to monitor and assess.

## 2015-08-13 NOTE — Progress Notes (Signed)
      [address]Suite [address]       [place] [address]             [phone]      C/o feeling confused  BP 96/56 mmHg  Pulse 65  Temp(Src) 98.8 F (37.1 C) (Oral)  Resp 7  Ht 5\' 9"  (1.753 m)  Wt 254 lb 10.1 oz (115.5 kg)  BMI 37.59 kg/m2  SpO2 92%   Intake/Output Summary (Last 24 hours) at 08/13/15 1750 Last data filed at 08/13/15 1700  Gross per 24 hour  Intake 5952.25 ml  Output   1290 ml  Net 4662.25 ml    Will add ditropan for bladder spasms  Creatinine better at 1.76, down from 1.93  Zenna Traister C. Roxan Hockey, MD [facility] [phone]

## 2015-08-13 NOTE — Progress Notes (Signed)
  Amiodarone Drug - Drug Interaction Consult Note  Recommendations: Monitor vitals, QTc  Amiodarone is metabolized by the cytochrome P450 system and therefore has the potential to cause many drug interactions. Amiodarone has an average plasma half-life of 50 days (range 20 to 100 days).   There is potential for drug interactions to occur several weeks or months after stopping treatment and the onset of drug interactions may be slow after initiating amiodarone.   []  Statins: Increased risk of myopathy. Simvastatin- restrict dose to 20mg  daily. Other statins: counsel patients to report any muscle pain or weakness immediately.  []  Anticoagulants: Amiodarone can increase anticoagulant effect. Consider warfarin dose reduction. Patients should be monitored closely and the dose of anticoagulant altered accordingly, remembering that amiodarone levels take several weeks to stabilize.  []  Antiepileptics: Amiodarone can increase plasma concentration of phenytoin, the dose should be reduced. Note that small changes in phenytoin dose can result in large changes in levels. Monitor patient and counsel on signs of toxicity.  [x]  Beta blockers: increased risk of bradycardia, AV block and myocardial depression. Sotalol - avoid concomitant use.  []   Calcium channel blockers (diltiazem and verapamil): increased risk of bradycardia, AV block and myocardial depression.  []   Cyclosporine: Amiodarone increases levels of cyclosporine. Reduced dose of cyclosporine is recommended.  []  Digoxin dose should be halved when amiodarone is started.  []  Diuretics: increased risk of cardiotoxicity if hypokalemia occurs.  []  Oral hypoglycemic agents (glyburide, glipizide, glimepiride): increased risk of hypoglycemia. Patient's glucose levels should be monitored closely when initiating amiodarone therapy.   [x]  Drugs that prolong the QT interval:  Torsades de pointes risk may be increased with concurrent use - avoid if  possible.  Monitor QTc, also keep magnesium/potassium WNL if concurrent therapy can't be avoided. Marland Kitchen Antibiotics: e.g. fluoroquinolones, erythromycin. . Antiarrhythmics: e.g. quinidine, procainamide, disopyramide, sotalol. . Antipsychotics: e.g. phenothiazines, haloperidol.  . Lithium, tricyclic antidepressants, and methadone. Thank You,  Narda Bonds  08/13/2015 5:41 AM

## 2015-08-13 NOTE — Progress Notes (Signed)
2 Days Post-Op Procedure(s) (LRB): CORONARY ARTERY BYPASS GRAFTING (CABG) X4 UTILIZING THE LEFT INTERNAL MAMMARY ARTERY TO LAD AND ENDOSCOPICALLY HARVESTED BILATERAL SAPHENEOUS VEINS TO DIAGONAL,OM1 AND OM2 (N/A) TRANSESOPHAGEAL ECHOCARDIOGRAM (TEE) (N/A) Subjective: Up in chair Some incisional pain C/o wheezing, denies nausea Went into a fib last night  Objective: Vital signs in last 24 hours: Temp:  [97.4 F (36.3 C)-100.2 F (37.9 C)] 98.8 F (37.1 C) (09/04 0800) Pulse Rate:  [83-126] 101 (09/04 0800) Cardiac Rhythm:  [-] Atrial fibrillation (09/04 0800) Resp:  [10-22] 14 (09/04 0800) BP: (92-144)/(56-96) 111/77 mmHg (09/04 0800) SpO2:  [91 %-96 %] 94 % (09/04 0800) Arterial Line BP: (92-111)/(54-66) 111/64 mmHg (09/03 1300) Weight:  [254 lb 10.1 oz (115.5 kg)] 254 lb 10.1 oz (115.5 kg) (09/04 0500)  Hemodynamic parameters for last 24 hours: PAP: (30-45)/(24-28) 45/28 mmHg  Intake/Output from previous day: 09/03 0701 - 09/04 0700 In: 2860.4 [P.O.:1260; I.V.:1450.4; IV Piggyback:150] Out: 1255 [Urine:1255] Intake/Output this shift: Total I/O In: 549.4 [I.V.:549.4] Out: 100 [Urine:100]  General appearance: alert and cooperative Neurologic: intact Heart: regular rate and rhythm Lungs: diminished breath sounds bibasilar and wheezes bilaterally Abdomen: distended, hypoactive BS  Lab Results:  Recent Labs  08/12/15 1731 08/13/15 0350  WBC 19.5* 13.2*  HGB 10.9* 10.1*  HCT 32.4* 30.6*  PLT 121* 98*   BMET:  Recent Labs  08/12/15 0315 08/12/15 1715 08/12/15 1731 08/13/15 0350  NA 136 135  --  132*  K 3.9 4.8  --  4.9  CL 105 100*  --  100*  CO2 26  --   --  26  GLUCOSE 117* 131*  --  151*  BUN 12 20  --  25*  CREATININE 1.00 1.70* 1.94* 1.93*  CALCIUM 8.0*  --   --  8.0*    PT/INR:  Recent Labs  08/11/15 1425  LABPROT 18.3*  INR 1.51*   ABG    Component Value Date/Time   PHART 7.386 08/12/2015 0500   HCO3 24.9* 08/12/2015 0500   TCO2 21  08/12/2015 1715   ACIDBASEDEF 2.0 08/11/2015 1938   O2SAT 94.8 08/12/2015 0500   CBG (last 3)   Recent Labs  08/13/15 0010 08/13/15 0355 08/13/15 0755  GLUCAP 118* 139* 130*    Assessment/Plan: S/P Procedure(s) (LRB): CORONARY ARTERY BYPASS GRAFTING (CABG) X4 UTILIZING THE LEFT INTERNAL MAMMARY ARTERY TO LAD AND ENDOSCOPICALLY HARVESTED BILATERAL SAPHENEOUS VEINS TO DIAGONAL,OM1 AND OM2 (N/A) TRANSESOPHAGEAL ECHOCARDIOGRAM (TEE) (N/A) -  POD # 2 CABG  CV- rapid a fib overnight- just converted to SR with amiodarone  Continue amiodarone drip  Will increase beta blocker when wheezing and BP are better  Continue dopamine due to low BP  RESP- bibasilar atelectasis L>R on CXR- IS add flutter valve  Wheezing- diuresis, pulmonary hygiene, xopenex PRN  RENAL- creatinine up to 1.93. Continue dopamine  Volume overloaded- diurese  ENDO- CBG well controlled, change to AC/HS  GI - distended and hypoactive BS c/w ileus  Continue metoclopramide  SCD + enoxaparin for DVT prophylaxis  Thrombocytopenia- PLT count down a little today, follow   LOS: 4 days    Melrose Nakayama 08/13/2015

## 2015-08-14 ENCOUNTER — Inpatient Hospital Stay (HOSPITAL_COMMUNITY): Payer: Medicare Other

## 2015-08-14 LAB — BASIC METABOLIC PANEL
ANION GAP: 10 (ref 5–15)
BUN: 35 mg/dL — ABNORMAL HIGH (ref 6–20)
CALCIUM: 8.2 mg/dL — AB (ref 8.9–10.3)
CO2: 28 mmol/L (ref 22–32)
Chloride: 96 mmol/L — ABNORMAL LOW (ref 101–111)
Creatinine, Ser: 1.68 mg/dL — ABNORMAL HIGH (ref 0.61–1.24)
GFR, EST AFRICAN AMERICAN: 44 mL/min — AB (ref >60)
GFR, EST NON AFRICAN AMERICAN: 38 mL/min — AB (ref >60)
Glucose, Bld: 111 mg/dL — ABNORMAL HIGH (ref 65–99)
POTASSIUM: 4 mmol/L (ref 3.5–5.1)
Sodium: 134 mmol/L — ABNORMAL LOW (ref 135–145)

## 2015-08-14 LAB — GLUCOSE, CAPILLARY
GLUCOSE-CAPILLARY: 114 mg/dL — AB (ref 65–99)
Glucose-Capillary: 97 mg/dL (ref 65–99)

## 2015-08-14 LAB — TYPE AND SCREEN
ABO/RH(D): A NEG
Antibody Screen: NEGATIVE
Unit division: 0
Unit division: 0

## 2015-08-14 LAB — CBC
HEMATOCRIT: 30.4 % — AB (ref 39.0–52.0)
Hemoglobin: 10.1 g/dL — ABNORMAL LOW (ref 13.0–17.0)
MCH: 28.1 pg (ref 26.0–34.0)
MCHC: 33.2 g/dL (ref 30.0–36.0)
MCV: 84.7 fL (ref 78.0–100.0)
PLATELETS: 124 10*3/uL — AB (ref 150–400)
RBC: 3.59 MIL/uL — AB (ref 4.22–5.81)
RDW: 13.7 % (ref 11.5–15.5)
WBC: 12.6 10*3/uL — AB (ref 4.0–10.5)

## 2015-08-14 MED ORDER — TRAMADOL HCL 50 MG PO TABS
50.0000 mg | ORAL_TABLET | Freq: Four times a day (QID) | ORAL | Status: DC | PRN
  Administered 2015-08-14: 50 mg via ORAL
  Filled 2015-08-14: qty 1

## 2015-08-14 NOTE — Progress Notes (Signed)
3 Days Post-Op Procedure(s) (LRB): CORONARY ARTERY BYPASS GRAFTING (CABG) X4 UTILIZING THE LEFT INTERNAL MAMMARY ARTERY TO LAD AND ENDOSCOPICALLY HARVESTED BILATERAL SAPHENEOUS VEINS TO DIAGONAL,OM1 AND OM2 (N/A) TRANSESOPHAGEAL ECHOCARDIOGRAM (TEE) (N/A) Subjective: C/o feeling weak and tired + flatus, no BM  Objective: Vital signs in last 24 hours: Temp:  [97.8 F (36.6 C)-98.8 F (37.1 C)] 98.3 F (36.8 C) (09/05 0400) Pulse Rate:  [64-85] 75 (09/05 0600) Cardiac Rhythm:  [-] Normal sinus rhythm (09/05 0400) Resp:  [6-18] 12 (09/05 0600) BP: (93-136)/(49-84) 136/76 mmHg (09/05 0600) SpO2:  [92 %-98 %] 95 % (09/05 0600) Weight:  [245 lb 9.5 oz (111.4 kg)] 245 lb 9.5 oz (111.4 kg) (09/05 0500)  Hemodynamic parameters for last 24 hours:    Intake/Output from previous day: 09/04 0701 - 09/05 0700 In: 1737 [P.O.:360; I.V.:1327; IV Piggyback:50] Out: 2365 [Urine:2365] Intake/Output this shift:    General appearance: alert, cooperative and mild distress Neurologic: intact Heart: regular rate and rhythm Lungs: diminished breath sounds bibasilar Abdomen: distended, nontender, hypoactive BS  Lab Results:  Recent Labs  08/13/15 0350 08/14/15 0440  WBC 13.2* 12.6*  HGB 10.1* 10.1*  HCT 30.6* 30.4*  PLT 98* 124*   BMET:  Recent Labs  08/13/15 1620 08/14/15 0440  NA 132* 134*  K 4.5 4.0  CL 100* 96*  CO2 27 28  GLUCOSE 148* 111*  BUN 32* 35*  CREATININE 1.76* 1.68*  CALCIUM 8.1* 8.2*    PT/INR:  Recent Labs  08/11/15 1425  LABPROT 18.3*  INR 1.51*   ABG    Component Value Date/Time   PHART 7.386 08/12/2015 0500   HCO3 24.9* 08/12/2015 0500   TCO2 21 08/12/2015 1715   ACIDBASEDEF 2.0 08/11/2015 1938   O2SAT 94.8 08/12/2015 0500   CBG (last 3)   Recent Labs  08/13/15 1142 08/13/15 1727 08/13/15 2138  GLUCAP 125* 118* 125*    Assessment/Plan: S/P Procedure(s) (LRB): CORONARY ARTERY BYPASS GRAFTING (CABG) X4 UTILIZING THE LEFT INTERNAL MAMMARY  ARTERY TO LAD AND ENDOSCOPICALLY HARVESTED BILATERAL SAPHENEOUS VEINS TO DIAGONAL,OM1 AND OM2 (N/A) TRANSESOPHAGEAL ECHOCARDIOGRAM (TEE) (N/A) -  POD # 3 CABG  CV- in SR on amiodarone drip, will continue IV amio until GI function improves  RESP- bibasilar atelectasis L > R- continue IS/ flutter  RENAL- creatinine better this AM  Wean dopamine as BP allows  ENDO- CBG Ok  GI- Ileus- continue reglan, minimize narcotics  DVT prophylaxis- SCD + enoxaparin  Thrombocytopenia- postop- improved  Deconditioning- mobilize, PT consult   LOS: 5 days    Tyler Mejia 08/14/2015

## 2015-08-15 ENCOUNTER — Encounter (HOSPITAL_COMMUNITY): Payer: Self-pay | Admitting: Cardiothoracic Surgery

## 2015-08-15 ENCOUNTER — Inpatient Hospital Stay (HOSPITAL_COMMUNITY): Payer: Medicare Other

## 2015-08-15 DIAGNOSIS — I2510 Atherosclerotic heart disease of native coronary artery without angina pectoris: Secondary | ICD-10-CM

## 2015-08-15 LAB — CBC
HCT: 28.4 % — ABNORMAL LOW (ref 39.0–52.0)
HEMOGLOBIN: 9.3 g/dL — AB (ref 13.0–17.0)
MCH: 27.8 pg (ref 26.0–34.0)
MCHC: 32.7 g/dL (ref 30.0–36.0)
MCV: 84.8 fL (ref 78.0–100.0)
PLATELETS: 135 10*3/uL — AB (ref 150–400)
RBC: 3.35 MIL/uL — AB (ref 4.22–5.81)
RDW: 13.9 % (ref 11.5–15.5)
WBC: 8.3 10*3/uL (ref 4.0–10.5)

## 2015-08-15 LAB — GLUCOSE, CAPILLARY
GLUCOSE-CAPILLARY: 116 mg/dL — AB (ref 65–99)
GLUCOSE-CAPILLARY: 77 mg/dL (ref 65–99)
Glucose-Capillary: 67 mg/dL (ref 65–99)
Glucose-Capillary: 100 mg/dL — ABNORMAL HIGH (ref 65–99)
Glucose-Capillary: 89 mg/dL (ref 65–99)
Glucose-Capillary: 97 mg/dL (ref 65–99)

## 2015-08-15 LAB — COMPREHENSIVE METABOLIC PANEL
ALT: 19 U/L (ref 17–63)
ANION GAP: 9 (ref 5–15)
AST: 31 U/L (ref 15–41)
Albumin: 2.4 g/dL — ABNORMAL LOW (ref 3.5–5.0)
Alkaline Phosphatase: 43 U/L (ref 38–126)
BUN: 38 mg/dL — ABNORMAL HIGH (ref 6–20)
CHLORIDE: 99 mmol/L — AB (ref 101–111)
CO2: 31 mmol/L (ref 22–32)
CREATININE: 1.53 mg/dL — AB (ref 0.61–1.24)
Calcium: 8.3 mg/dL — ABNORMAL LOW (ref 8.9–10.3)
GFR, EST AFRICAN AMERICAN: 49 mL/min — AB (ref >60)
GFR, EST NON AFRICAN AMERICAN: 42 mL/min — AB (ref >60)
Glucose, Bld: 80 mg/dL (ref 65–99)
Potassium: 3.5 mmol/L (ref 3.5–5.1)
SODIUM: 139 mmol/L (ref 135–145)
Total Bilirubin: 0.8 mg/dL (ref 0.3–1.2)
Total Protein: 5.5 g/dL — ABNORMAL LOW (ref 6.5–8.1)

## 2015-08-15 MED ORDER — LACTULOSE 10 GM/15ML PO SOLN
30.0000 g | Freq: Every day | ORAL | Status: AC
  Administered 2015-08-16: 30 g via ORAL
  Filled 2015-08-15 (×2): qty 45

## 2015-08-15 MED ORDER — SODIUM CHLORIDE 0.9 % IV SOLN: 250.0000 mL | INTRAVENOUS | Status: DC | PRN

## 2015-08-15 MED ORDER — POTASSIUM CHLORIDE 10 MEQ/50ML IV SOLN
10.0000 meq | INTRAVENOUS | Status: AC
  Administered 2015-08-15 (×2): 10 meq via INTRAVENOUS
  Filled 2015-08-15: qty 50

## 2015-08-15 MED ORDER — FUROSEMIDE 10 MG/ML IJ SOLN
40.0000 mg | Freq: Every day | INTRAMUSCULAR | Status: DC
  Administered 2015-08-16 – 2015-08-21 (×5): 40 mg via INTRAVENOUS
  Filled 2015-08-15 (×12): qty 4

## 2015-08-15 MED ORDER — SODIUM CHLORIDE 0.9 % IJ SOLN
3.0000 mL | Freq: Two times a day (BID) | INTRAMUSCULAR | Status: DC
  Administered 2015-08-15 – 2015-08-24 (×12): 3 mL via INTRAVENOUS

## 2015-08-15 MED ORDER — MOVING RIGHT ALONG BOOK
Freq: Once | Status: AC
  Administered 2015-08-15: 15:00:00
  Filled 2015-08-15 (×2): qty 1

## 2015-08-15 MED ORDER — POTASSIUM CHLORIDE CRYS ER 20 MEQ PO TBCR
20.0000 meq | EXTENDED_RELEASE_TABLET | Freq: Every day | ORAL | Status: DC
  Administered 2015-08-16: 20 meq via ORAL
  Filled 2015-08-15: qty 1

## 2015-08-15 MED ORDER — MAGNESIUM HYDROXIDE 400 MG/5ML PO SUSP: 30.0000 mL | Freq: Every day | ORAL | Status: DC | PRN

## 2015-08-15 MED ORDER — SODIUM CHLORIDE 0.9 % IJ SOLN: 3.0000 mL | INTRAMUSCULAR | Status: DC | PRN

## 2015-08-15 MED ORDER — AMIODARONE HCL 200 MG PO TABS
400.0000 mg | ORAL_TABLET | Freq: Two times a day (BID) | ORAL | Status: DC
  Administered 2015-08-15 – 2015-08-16 (×3): 400 mg via ORAL
  Filled 2015-08-15 (×8): qty 2

## 2015-08-15 MED ORDER — POTASSIUM CHLORIDE 10 MEQ/50ML IV SOLN
10.0000 meq | Freq: Once | INTRAVENOUS | Status: AC
  Administered 2015-08-15: 10 meq via INTRAVENOUS

## 2015-08-15 NOTE — Evaluation (Signed)
Physical Therapy Evaluation Patient Details Name: Tyler Mejia MRN: [id number] DOB: [birth year] Today's Date: 08/15/2015   History of Present Illness  Patient is a 79 yo male s/p CORONARY ARTERY BYPASS GRAFTING (CABG) X4   Clinical Impression  Patient demonstrates deficits in functional mobility as indicated below. Will benefit from coninued skilled PT to address deficits and maximize function. Will see as indicated and progress as tolerated. Patient resides alone with two story home bed/bath upstairs. Anticipate patient will need continued post acute therapy to regain independence prior to home. Recommend ST SNF upon acute discharge.    OF NOTE: ambulated on roo air with VSS, replaced 2 liters O2 post activity    Follow Up Recommendations SNF    Equipment Recommendations  Rolling walker with 5" wheels    Recommendations for Other Services       Precautions / Restrictions Precautions Precautions: Sternal;Fall Restrictions Weight Bearing Restrictions:  (sternal prec.)      Mobility  Bed Mobility Overal bed mobility: Needs Assistance Bed Mobility: Supine to Sit     Supine to sit: Mod assist;+2 for physical assistance;HOB elevated     General bed mobility comments: Moderate assist to elevate trunk and rotate to EOB. VCs for compliance of precautions. Cues for lateral lean to shift hips to EOB.  Transfers Overall transfer level: Needs assistance   Transfers: Sit to/from Stand Sit to Stand: Min assist         General transfer comment: Min assist to power up from EOB. VCs for positioning and use of LEs to power up holding sternal pillow for splinting of chest during transition  Ambulation/Gait Ambulation/Gait assistance: Min assist Ambulation Distance (Feet): 25 Feet Assistive device: Rolling walker (2 wheeled) Gait Pattern/deviations: Decreased stride length;Shuffle;Trunk flexed Gait velocity: decreased Gait velocity interpretation: Below normal speed for  age/gender General Gait Details: some modest instability with ambulation. Decreased activity tolerance. Ambulated on room air. + dizziness with initial mobility, resolved at rest  Stairs            Wheelchair Mobility    Modified Rankin (Stroke Patients Only)       Balance Overall balance assessment: Needs assistance Sitting-balance support: Feet supported Sitting balance-Leahy Scale: Fair Sitting balance - Comments: able to maintain EOb without physical assist   Standing balance support: Bilateral upper extremity supported;During functional activity Standing balance-Leahy Scale: Poor Standing balance comment: heavy reliance on the RW                              Pertinent Vitals/Pain      Home Living Family/patient expects to be discharged to:: Private residence Living Arrangements: Alone Available Help at Discharge: Available PRN/intermittently Type of Home: House Home Access: Stairs to enter Entrance Stairs-Rails: None Entrance Stairs-Number of Steps: 4 Home Layout: Two level        Prior Function                 Hand Dominance        Extremity/Trunk Assessment   Upper Extremity Assessment: Defer to OT evaluation           Lower Extremity Assessment: Overall WFL for tasks assessed         Communication   Communication: HOH  Cognition Arousal/Alertness: Awake/alert Behavior During Therapy: WFL for tasks assessed/performed Overall Cognitive Status: Within Functional Limits for tasks assessed  General Comments      Exercises        Assessment/Plan    PT Assessment Patient needs continued PT services  PT Diagnosis Difficulty walking;Abnormality of gait;Generalized weakness;Acute pain   PT Problem List Decreased strength;Decreased activity tolerance;Decreased balance;Decreased mobility;Decreased coordination;Decreased knowledge of use of DME;Decreased knowledge of precautions;Pain  PT  Treatment Interventions DME instruction;Gait training;Stair training;Functional mobility training;Therapeutic activities;Therapeutic exercise;Balance training;Patient/family education   PT Goals (Current goals can be found in the Care Plan section) Acute Rehab PT Goals Patient Stated Goal: to get stronger PT Goal Formulation: With patient Time For Goal Achievement: 08/29/15 Potential to Achieve Goals: Good    Frequency Min 3X/week   Barriers to discharge Inaccessible home environment;Decreased caregiver support bed/bath upstairs, lives alone    Co-evaluation               End of Session Equipment Utilized During Treatment: Gait belt;Oxygen Activity Tolerance: Patient limited by fatigue Patient left: in chair;with call bell/phone within reach (VSS on room air >93%, on 2 liters >95%) Nurse Communication: Mobility status;Precautions;Other (comment)         Time: 7619-5093 PT Time Calculation (min) (ACUTE ONLY): 23 min   Charges:   PT Evaluation $Initial PT Evaluation Tier I: 1 Procedure PT Treatments $Therapeutic Activity: 8-22 mins   PT G CodesDuncan Dull August 20, 2015, 9:34 AM Alben Deeds, PT DPT  [phone]

## 2015-08-15 NOTE — Progress Notes (Signed)
Utilization review completed.  

## 2015-08-15 NOTE — Progress Notes (Signed)
CARDIAC REHAB PHASE I   PRE:  Rate/Rhythm: 73 sR  BP:  Sitting: 126/60        SaO2: 92-93 RA  MODE:  Ambulation: 150 ft   POST:  Rate/Rhythm: 88 SR  BP:  Sitting: 157/67         SaO2: 89-92 RA  Pt lying in bed with moderate wheezing, states he is very tired. Pt ambulated 150 ft on RA, IV, gait belt, rollator, assist x2, fairly steady gait, tolerated fair. Pt c/o DOE, declined rest stop. Pt did seem rushed to return to room, encouraged pt to stand up straight and encouraged pursed lip breathing. Pt denies dizziness. Pt wheezing more post ambulation, oxygen sats 89-92 on RA, RN at bedside, aware. Pt to recliner after walk, feet elevated, call bell within reach. Encouraged IS, flutter valve, pt verbalized understanding. Will follow.  [phone]  Lenna Sciara, RN, BSN 08/15/2015 2:22 PM

## 2015-08-15 NOTE — Care Management Important Message (Signed)
Important Message  Patient Details  Name: ESSAM LOWDERMILK MRN: [id number] Date of Birth: June 13, 1936   Medicare Important Message Given:  Yes-second notification given    Nathen May 08/15/2015, 11:45 AMImportant Message  Patient Details  Name: ALIZE ACY MRN: [id number] Date of Birth: [birth year]   Medicare Important Message Given:  Yes-second notification given    Nathen May 08/15/2015, 11:45 AM

## 2015-08-15 NOTE — Progress Notes (Signed)
4 Days Post-Op Procedure(s) (LRB): CORONARY ARTERY BYPASS GRAFTING (CABG) X4 UTILIZING THE LEFT INTERNAL MAMMARY ARTERY TO LAD AND ENDOSCOPICALLY HARVESTED BILATERAL SAPHENEOUS VEINS TO DIAGONAL,OM1 AND OM2 (N/A) TRANSESOPHAGEAL ECHOCARDIOGRAM (TEE) (N/A) Subjective: nsr Walking in hall with PT CXR with small L effusion- atelectasis  Objective: Vital signs in last 24 hours: Temp:  [97.3 F (36.3 C)-99.8 F (37.7 C)] 99.8 F (37.7 C) (09/06 0800) Pulse Rate:  [62-79] 79 (09/06 0800) Cardiac Rhythm:  [-] Normal sinus rhythm (09/06 0800) Resp:  [9-23] 18 (09/06 0800) BP: (95-139)/(46-75) 115/60 mmHg (09/06 0700) SpO2:  [94 %-100 %] 94 % (09/06 0800) Weight:  [241 lb 6.5 oz (109.5 kg)] 241 lb 6.5 oz (109.5 kg) (09/06 0500)  Hemodynamic parameters for last 24 hours:  stable  Intake/Output from previous day: 09/05 0701 - 09/06 0700 In: 1195.4 [P.O.:540; I.V.:655.4] Out: 3140 [Urine:3140] Intake/Output this shift: Total I/O In: 76.7 [I.V.:26.7; IV Piggyback:50] Out: 80 [Urine:80]  Neuro intact and   Lab Results:  Recent Labs  08/14/15 0440 08/15/15 0450  WBC 12.6* 8.3  HGB 10.1* 9.3*  HCT 30.4* 28.4*  PLT 124* 135*   BMET:  Recent Labs  08/14/15 0440 08/15/15 0450  NA 134* 139  K 4.0 3.5  CL 96* 99*  CO2 28 31  GLUCOSE 111* 80  BUN 35* 38*  CREATININE 1.68* 1.53*  CALCIUM 8.2* 8.3*    PT/INR: No results for input(s): LABPROT, INR in the last 72 hours. ABG    Component Value Date/Time   PHART 7.386 08/12/2015 0500   HCO3 24.9* 08/12/2015 0500   TCO2 21 08/12/2015 1715   ACIDBASEDEF 2.0 08/11/2015 1938   O2SAT 94.8 08/12/2015 0500   CBG (last 3)   Recent Labs  08/14/15 1113 08/14/15 1515 08/14/15 2143  GLUCAP 114* 116* 77    Assessment/Plan: S/P Procedure(s) (LRB): CORONARY ARTERY BYPASS GRAFTING (CABG) X4 UTILIZING THE LEFT INTERNAL MAMMARY ARTERY TO LAD AND ENDOSCOPICALLY HARVESTED BILATERAL SAPHENEOUS VEINS TO DIAGONAL,OM1 AND OM2  (N/A) TRANSESOPHAGEAL ECHOCARDIOGRAM (TEE) (N/A) Mobilize Diuresis Diabetes control d/c tubes/lines Plan for transfer to step-down: see transfer orders   LOS: 6 days    Tyler Mejia 08/15/2015

## 2015-08-16 ENCOUNTER — Inpatient Hospital Stay (HOSPITAL_COMMUNITY): Payer: Medicare Other

## 2015-08-16 DIAGNOSIS — Z951 Presence of aortocoronary bypass graft: Secondary | ICD-10-CM

## 2015-08-16 LAB — HEPATIC FUNCTION PANEL
ALBUMIN: 2.7 g/dL — AB (ref 3.5–5.0)
ALT: 24 U/L (ref 17–63)
AST: 41 U/L (ref 15–41)
Alkaline Phosphatase: 54 U/L (ref 38–126)
Bilirubin, Direct: 0.3 mg/dL (ref 0.1–0.5)
Indirect Bilirubin: 1.1 mg/dL — ABNORMAL HIGH (ref 0.3–0.9)
TOTAL PROTEIN: 6.2 g/dL — AB (ref 6.5–8.1)
Total Bilirubin: 1.4 mg/dL — ABNORMAL HIGH (ref 0.3–1.2)

## 2015-08-16 LAB — GLUCOSE, CAPILLARY
GLUCOSE-CAPILLARY: 107 mg/dL — AB (ref 65–99)
GLUCOSE-CAPILLARY: 104 mg/dL — AB (ref 65–99)
GLUCOSE-CAPILLARY: 107 mg/dL — AB (ref 65–99)
Glucose-Capillary: 111 mg/dL — ABNORMAL HIGH (ref 65–99)

## 2015-08-16 LAB — CBC
HCT: 32.7 % — ABNORMAL LOW (ref 39.0–52.0)
Hemoglobin: 10.6 g/dL — ABNORMAL LOW (ref 13.0–17.0)
MCH: 27.7 pg (ref 26.0–34.0)
MCHC: 32.4 g/dL (ref 30.0–36.0)
MCV: 85.4 fL (ref 78.0–100.0)
Platelets: 214 10*3/uL (ref 150–400)
RBC: 3.83 MIL/uL — ABNORMAL LOW (ref 4.22–5.81)
RDW: 14 % (ref 11.5–15.5)
WBC: 10.1 10*3/uL (ref 4.0–10.5)

## 2015-08-16 LAB — BASIC METABOLIC PANEL
Anion gap: 11 (ref 5–15)
BUN: 41 mg/dL — ABNORMAL HIGH (ref 6–20)
CO2: 28 mmol/L (ref 22–32)
Calcium: 9 mg/dL (ref 8.9–10.3)
Chloride: 100 mmol/L — ABNORMAL LOW (ref 101–111)
Creatinine, Ser: 1.33 mg/dL — ABNORMAL HIGH (ref 0.61–1.24)
GFR calc Af Amer: 58 mL/min — ABNORMAL LOW (ref >60)
GFR calc non Af Amer: 50 mL/min — ABNORMAL LOW (ref >60)
Glucose, Bld: 104 mg/dL — ABNORMAL HIGH (ref 65–99)
Potassium: 3.7 mmol/L (ref 3.5–5.1)
Sodium: 139 mmol/L (ref 135–145)

## 2015-08-16 LAB — AMYLASE: Amylase: 48 U/L (ref 28–100)

## 2015-08-16 MED ORDER — MORPHINE SULFATE (PF) 2 MG/ML IV SOLN
2.0000 mg | Freq: Once | INTRAVENOUS | Status: AC
  Administered 2015-08-16: 2 mg via INTRAVENOUS
  Filled 2015-08-16: qty 1

## 2015-08-16 MED ORDER — POTASSIUM CHLORIDE IN NACL 20-0.9 MEQ/L-% IV SOLN
INTRAVENOUS | Status: DC
  Administered 2015-08-16: via INTRAVENOUS
  Filled 2015-08-16 (×2): qty 1000

## 2015-08-16 MED ORDER — METOCLOPRAMIDE HCL 5 MG/ML IJ SOLN: 10.0000 mg | Freq: Four times a day (QID) | INTRAMUSCULAR | Status: DC

## 2015-08-16 MED ORDER — TRAVASOL 10 % IV SOLN: INTRAVENOUS | Status: DC

## 2015-08-16 MED ORDER — SODIUM CHLORIDE 0.9 % IV SOLN
500.0000 mg | Freq: Three times a day (TID) | INTRAVENOUS | Status: AC
  Administered 2015-08-16 – 2015-08-22 (×18): 500 mg via INTRAVENOUS
  Filled 2015-08-16 (×25): qty 500

## 2015-08-16 MED ORDER — PANTOPRAZOLE SODIUM 40 MG IV SOLR: 40.0000 mg | INTRAVENOUS | Status: DC

## 2015-08-16 MED ORDER — PANTOPRAZOLE SODIUM 40 MG IV SOLR
40.0000 mg | INTRAVENOUS | Status: DC
  Administered 2015-08-17 – 2015-08-21 (×5): 40 mg via INTRAVENOUS
  Filled 2015-08-16 (×7): qty 40

## 2015-08-16 MED ORDER — SODIUM CHLORIDE 0.9 % IV SOLN
INTRAVENOUS | Status: DC
  Administered 2015-08-16: 08:00:00 via INTRAVENOUS

## 2015-08-16 NOTE — Progress Notes (Signed)
Pt became nauseas and vomited x2 this morning.  PRN zofran given.  Pt states he feels a little better.  Pt currently resting with call bell within reach.  Will cont to monitor pt closely.  Claudette Stapler, RN

## 2015-08-16 NOTE — Progress Notes (Signed)
CARDIAC REHAB PHASE I   PRE:  Rate/Rhythm: 83 SR  BP:  Sitting: 149/71        SaO2: 94 1L  MODE:  Ambulation: 190 ft   POST:  Rate/Rhythm: 94 SR  BP:  Sitting: 165/73         SaO2: 96 1L  Pt sitting up in recliner, reports not feeling well generally. Pt ambulated 190 ft on 1L O2, rolling walker, IV, gait belt, assist x2, slow, steady gait, tolerated fair. Pt did c/o of DOE, fatigue, denies dizziness, nausea, declined rest stop. Pt states he prefers rollator to rolling walker. Pt had audible wheeze post ambulation, sats 96% on 1L. RN at bedside. Pt needing reminders of sternal precautions and pursed lip breathing. Pt to recliner after walk, feet elevated, call bell within reach. Encouraged IS, ambulation, pt verbalized understanding. Will follow. Can be assist x1.   [id number]  Lenna Sciara, RN, BSN 08/16/2015 11:06 AM

## 2015-08-16 NOTE — Clinical Social Work Note (Signed)
Clinical Social Work Assessment  Patient Details  Name: Tyler Mejia MRN: [id number] Date of Birth: 01/07/1936  Date of referral:  08/16/15               Reason for consult:  Facility Placement                Permission sought to share information with:  Chartered certified accountant granted to share information::  Yes, Verbal Permission Granted  Name::        Agency::  [facility]  Relationship::     Contact Information:     Housing/Transportation Living arrangements for the past 2 months:  [place] of Information:  Patient Patient Interpreter Needed:  None Criminal Activity/Legal Involvement Pertinent to Current Situation/Hospitalization:  No - Comment as needed Significant Relationships:  Adult Children Lives with:  Self Do you feel safe going back to the place where you live?  Yes Need for family participation in patient care:  No (Coment)  Care giving concerns:  Pt lives alone and family is not able to provide 24 hour assistance at this time   Facilities manager / plan:  CSW spoke with pt concerning PT recommendation for SNF at time of DC  Employment status:  Retired Forensic scientist:    PT Recommendations:  [place] / Referral to community resources:  [place]  Patient/Family's Response to care:  Pt is agreeable to SNF- understands that he will need more assistance than he will have available at home  Patient/Family's Understanding of and Emotional Response to Diagnosis, Current Treatment, and Prognosis: no questions or concerns at this time  Emotional Assessment Appearance:  Appears stated age Attitude/Demeanor/Rapport:    Affect (typically observed):  Appropriate Orientation:  Oriented to Self, Oriented to Place, Oriented to  Time, Oriented to Situation Alcohol / Substance use:  Not Applicable Psych involvement (Current and /or in the community):  No  (Comment)  Discharge Needs  Concerns to be addressed:  Care Coordination Readmission within the last 30 days:  No Current discharge risk:  Physical Impairment, Lives alone Barriers to Discharge:  Continued Medical Work up   [employer], LCSW 08/16/2015, 10:23 AM

## 2015-08-16 NOTE — Progress Notes (Addendum)
      [address]       [place] [address]             [phone]      5 Days Post-Op Procedure(s) (LRB): CORONARY ARTERY BYPASS GRAFTING (CABG) X4 UTILIZING THE LEFT INTERNAL MAMMARY ARTERY TO LAD AND ENDOSCOPICALLY HARVESTED BILATERAL SAPHENEOUS VEINS TO DIAGONAL,OM1 AND OM2 (N/A) TRANSESOPHAGEAL ECHOCARDIOGRAM (TEE) (N/A)   Subjective:  Mr. Dolberry complains of not feeling well this morning.  He complains of nausea and vomiting.  He states this began yesterday afternoon and has continued to this morning.  He states he is vomiting straight liquid and the nurse states its just yellow fluid.  He moved his bowels last night and states it was formed stool.  Objective: Vital signs in last 24 hours: Temp:  [97.7 F (36.5 C)-98.3 F (36.8 C)] 98.1 F (36.7 C) (09/07 0354) Pulse Rate:  [71-85] 80 (09/07 0354) Cardiac Rhythm:  [-] Normal sinus rhythm (09/06 2030) Resp:  [15-18] 18 (09/07 0354) BP: (118-144)/(55-94) 127/94 mmHg (09/07 0354) SpO2:  [93 %-97 %] 95 % (09/07 0354) Weight:  [244 lb 4.8 oz (110.814 kg)] 244 lb 4.8 oz (110.814 kg) (09/07 0354)  Intake/Output from previous day: 09/06 0701 - 09/07 0700 In: 510.1 [P.O.:350; I.V.:60.1; IV Piggyback:100] Out: 550 [Urine:550]  General appearance: alert, cooperative and no distress Heart: regular rate and rhythm Lungs: wheezes bilaterally Abdomen: distented, mild tenderness, no BS appreciated Extremities: edema trace Wound: clean and dry  Lab Results:  Recent Labs  08/15/15 0450 08/16/15 0457  WBC 8.3 10.1  HGB 9.3* 10.6*  HCT 28.4* 32.7*  PLT 135* 214   BMET:  Recent Labs  08/15/15 0450 08/16/15 0457  NA 139 139  K 3.5 3.7  CL 99* 100*  CO2 31 28  GLUCOSE 80 104*  BUN 38* 41*  CREATININE 1.53* 1.33*  CALCIUM 8.3* 9.0    PT/INR: No results for input(s): LABPROT, INR in the last 72 hours. ABG    Component Value Date/Time   PHART 7.386 08/12/2015 0500   HCO3 24.9* 08/12/2015 0500   TCO2  21 08/12/2015 1715   ACIDBASEDEF 2.0 08/11/2015 1938   O2SAT 94.8 08/12/2015 0500   CBG (last 3)   Recent Labs  08/15/15 1654 08/15/15 2107 08/16/15 0609  GLUCAP 89 97 107*    Assessment/Plan: S/P Procedure(s) (LRB): CORONARY ARTERY BYPASS GRAFTING (CABG) X4 UTILIZING THE LEFT INTERNAL MAMMARY ARTERY TO LAD AND ENDOSCOPICALLY HARVESTED BILATERAL SAPHENEOUS VEINS TO DIAGONAL,OM1 AND OM2 (N/A) TRANSESOPHAGEAL ECHOCARDIOGRAM (TEE) (N/A)  1. CV- maintaining NSR, rate and pressure controlled- continue Lopressor 2. Pulm- wean oxygen as tolerated, wheezing throughout, continue IS, continue nebs  3. Renal- creatinine trending down, current at 1.33, mild hypervolemia continue Lasix 4. GI- + nausea/vomiting with abd distention, absent BS- probable ileus- NPO, NS at 43ml/hr, ABD film, d/c narcotics, check amylase, liver panel 5. CBGs controlled- patient not a diabetic, will d/c insulin and finger sticks 6. DIspo- patient with likely Ileus, continue bowel support, hemodynamically stable at this time, continue current care  LOS: 7 days    BARRETT, ERIN 08/16/2015  Patient examined and KUB reviewed findings c/w ileus- cont reglan, NPO and IVF Stop narcotics

## 2015-08-16 NOTE — Progress Notes (Signed)
PT Cancellation Note  Patient Details Name: BETHANY CUMMING MRN: [id number] DOB: [birth year]   Cancelled Treatment:    Reason Eval/Treat Not Completed: Patient unavailable--bathing. States he does not want to walk after bath as he walked recently with Cardiac Rehab.  Will follow-up 08/17/15.   Anicia Leuthold 08/16/2015, 11:53 AM  Pager [phone]

## 2015-08-16 NOTE — Progress Notes (Signed)
NG inserted per request of MD.  1400cc dk. Green/brown bile with strong odor noted returned.  Patient also vomited approx. 300cc green/brown emesis while putting it in.  Patient tolerated fair.  [name], PA-C notified of return contents.  Patient's RN made aware of NG being placed.

## 2015-08-16 NOTE — Progress Notes (Signed)
Called by Mr. Schreier nurse due to the patient complaining of severe abdominal pain and projectile vomiting.  The patient states he has severe stomach pain.  It hurts so bad he can't stand it.  He states he has been vomiting more than he was this morning.  He would like pain medication.   PE:  BP 161/77 mmHg  Pulse 87  Temp(Src) 97.6 F (36.4 C) (Oral)  Resp 18  Ht 5\' 9"  (1.753 m)  Wt 244 lb 4.8 oz (110.814 kg)  BMI 36.06 kg/m2  SpO2 97%  Gen: no apparent distress Heart: RRR Lungs: CTA ABD:  Worsening abdominal distention in comparison this morning, continues to have no BS  A/P:  1. Worsening abdominal distention/vomiting- spoke with Dr. Prescott Gum- will get CT scan abdomen without contrast.  Place NG tube to intermittent suction 2.  Abdominal pain- likely related to Ileus, but he could have bowel obstruction, CT scan ordered, will give one time dose of IV Morphine however, need to avoid narcotics 3. Continue IV Fluids 4. NPO strict, can have ice chips to wet mouth

## 2015-08-16 NOTE — Progress Notes (Signed)
Pt started having episodes of vomiting large amounts of greenish- yellowish liquid vomitus at start of shift (0830). Vomit noted with strong odor. On assessment, pt's abdomen noted to be distended, without any bowel sounds. Pt complained of abdominal discomfort, which was tolerable at first, but got worst as the shift progressed this afternoon. He had an ng-tube inserted and he immediately had 1,461ml of dark greenish secretions out. Pt stated decreased abdominal pain after ng-tube inserted. He had an abdominal xray done. He was not  able to tolerate any food /drink by mouth this AM . He has been kept NPO since this AM. Pt continued vomiting throughout the shift. Noted with projectile vomiting. No more vomiting episodes since ng tube inserted. Pt received contrast through his ng tube at 1800, as he is scheduled for CT scan of his abdomen and pelvis. He was transferred to the Intensive Care Unit at 1830. Report given to ICU RN.

## 2015-08-16 NOTE — Progress Notes (Signed)
PT Cancellation Note  Patient Details Name: Tyler Mejia MRN: [id number] DOB: [birth year]   Cancelled Treatment:    Reason Eval/Treat Not Completed: Patient at procedure or test/unavailable. Nursing assisting back to bed for procedure   Keylan Costabile 08/16/2015, 8:35 AM Pager [phone]

## 2015-08-16 NOTE — Progress Notes (Signed)
CT surgery p.m. Rounds  Patient had severe vomiting on 2 west NG tube placed and greater than 1 L of bilious material removed Urgent CT scan of the abdomen and pelvis shows probable ileus Patient had small bowel movement today We'll leave NG tube in for the next 24-48 hours and start parenteral TPN PICC line has been ordered

## 2015-08-16 NOTE — Clinical Social Work Placement (Signed)
   CLINICAL SOCIAL WORK PLACEMENT  NOTE  Date:  08/16/2015  Patient Details  Name: Tyler Mejia MRN: [id number] Date of Birth: [birth year]  Clinical Social Work is seeking post-discharge placement for this patient at the [place] level of care (*CSW will initial, date and re-position this form in  chart as items are completed):  Yes   Patient/family provided with [place] Work Department's list of facilities offering this level of care within the geographic area requested by the patient (or if unable, by the patient's family).  Yes   Patient/family informed of their freedom to choose among providers that offer the needed level of care, that participate in Medicare, Medicaid or managed care program needed by the patient, have an available bed and are willing to accept the patient.  Yes   Patient/family informed of [place]'s ownership interest in [facility] and [facility], as well as of the fact that they are under no obligation to receive care at these facilities.  PASRR submitted to EDS on 08/16/15     PASRR number received on 08/16/15     Existing PASRR number confirmed on       FL2 transmitted to all facilities in geographic area requested by pt/family on 08/16/15     FL2 transmitted to all facilities within larger geographic area on       Patient informed that his/her managed care company has contracts with or will negotiate with certain facilities, including the following:            Patient/family informed of bed offers received.  Patient chooses bed at       Physician recommends and patient chooses bed at      Patient to be transferred to   on  .  Patient to be transferred to facility by       Patient family notified on   of transfer.  Name of family member notified:        PHYSICIAN Please sign FL2     Additional Comment:    _______________________________________________ Cranford Mon, LCSW 08/16/2015, 10:26  AM

## 2015-08-17 ENCOUNTER — Inpatient Hospital Stay (HOSPITAL_COMMUNITY): Payer: Medicare Other

## 2015-08-17 LAB — CBC
HCT: 31.3 % — ABNORMAL LOW (ref 39.0–52.0)
Hemoglobin: 10.2 g/dL — ABNORMAL LOW (ref 13.0–17.0)
MCH: 27.8 pg (ref 26.0–34.0)
MCHC: 32.6 g/dL (ref 30.0–36.0)
MCV: 85.3 fL (ref 78.0–100.0)
Platelets: 223 10*3/uL (ref 150–400)
RBC: 3.67 MIL/uL — ABNORMAL LOW (ref 4.22–5.81)
RDW: 13.7 % (ref 11.5–15.5)
WBC: 6.2 10*3/uL (ref 4.0–10.5)

## 2015-08-17 LAB — COMPREHENSIVE METABOLIC PANEL WITH GFR
ALT: 23 U/L (ref 17–63)
AST: 32 U/L (ref 15–41)
Albumin: 2.4 g/dL — ABNORMAL LOW (ref 3.5–5.0)
Alkaline Phosphatase: 48 U/L (ref 38–126)
Anion gap: 12 (ref 5–15)
BUN: 41 mg/dL — ABNORMAL HIGH (ref 6–20)
CO2: 31 mmol/L (ref 22–32)
Calcium: 8.5 mg/dL — ABNORMAL LOW (ref 8.9–10.3)
Chloride: 97 mmol/L — ABNORMAL LOW (ref 101–111)
Creatinine, Ser: 1.36 mg/dL — ABNORMAL HIGH (ref 0.61–1.24)
GFR calc Af Amer: 57 mL/min — ABNORMAL LOW
GFR calc non Af Amer: 49 mL/min — ABNORMAL LOW
Glucose, Bld: 115 mg/dL — ABNORMAL HIGH (ref 65–99)
Potassium: 3.7 mmol/L (ref 3.5–5.1)
Sodium: 140 mmol/L (ref 135–145)
Total Bilirubin: 1.4 mg/dL — ABNORMAL HIGH (ref 0.3–1.2)
Total Protein: 5.6 g/dL — ABNORMAL LOW (ref 6.5–8.1)

## 2015-08-17 LAB — PHOSPHORUS: Phosphorus: 3.8 mg/dL (ref 2.5–4.6)

## 2015-08-17 LAB — PREALBUMIN: Prealbumin: 10.1 mg/dL — ABNORMAL LOW (ref 18–38)

## 2015-08-17 LAB — GLUCOSE, CAPILLARY
GLUCOSE-CAPILLARY: 121 mg/dL — AB (ref 65–99)
GLUCOSE-CAPILLARY: 114 mg/dL — AB (ref 65–99)
Glucose-Capillary: 103 mg/dL — ABNORMAL HIGH (ref 65–99)
Glucose-Capillary: 136 mg/dL — ABNORMAL HIGH (ref 65–99)

## 2015-08-17 LAB — POCT I-STAT, CHEM 8
BUN: 38 mg/dL — AB (ref 6–20)
CALCIUM ION: 1.09 mmol/L — AB (ref 1.13–1.30)
CREATININE: 1.4 mg/dL — AB (ref 0.61–1.24)
Chloride: 100 mmol/L — ABNORMAL LOW (ref 101–111)
Glucose, Bld: 121 mg/dL — ABNORMAL HIGH (ref 65–99)
HEMATOCRIT: 31 % — AB (ref 39.0–52.0)
HEMOGLOBIN: 10.5 g/dL — AB (ref 13.0–17.0)
Potassium: 3.5 mmol/L (ref 3.5–5.1)
SODIUM: 143 mmol/L (ref 135–145)
TCO2: 30 mmol/L (ref 0–100)

## 2015-08-17 LAB — MAGNESIUM: Magnesium: 2.1 mg/dL (ref 1.7–2.4)

## 2015-08-17 MED ORDER — FENTANYL CITRATE (PF) 100 MCG/2ML IJ SOLN
25.0000 ug | INTRAMUSCULAR | Status: DC | PRN
Start: 2015-08-17 — End: 2015-08-23
  Administered 2015-08-18 – 2015-08-19 (×2): 25 ug via INTRAVENOUS
  Administered 2015-08-19: 50 ug via INTRAVENOUS
  Administered 2015-08-19: 25 ug via INTRAVENOUS
  Administered 2015-08-20 (×3): 50 ug via INTRAVENOUS
  Administered 2015-08-20: 25 ug via INTRAVENOUS
  Administered 2015-08-20: 50 ug via INTRAVENOUS
  Administered 2015-08-20: 25 ug via INTRAVENOUS
  Administered 2015-08-21: 50 ug via INTRAVENOUS
  Administered 2015-08-21: 25 ug via INTRAVENOUS
  Administered 2015-08-21: 50 ug via INTRAVENOUS
  Administered 2015-08-21: 25 ug via INTRAVENOUS
  Administered 2015-08-21 (×2): 50 ug via INTRAVENOUS
  Administered 2015-08-21: 25 ug via INTRAVENOUS
  Administered 2015-08-22 – 2015-08-23 (×8): 50 ug via INTRAVENOUS
  Filled 2015-08-17 (×25): qty 2

## 2015-08-17 MED ORDER — METOPROLOL TARTRATE 1 MG/ML IV SOLN: 2.5000 mg | INTRAVENOUS | Status: DC | PRN

## 2015-08-17 MED ORDER — POTASSIUM CHLORIDE 10 MEQ/100ML IV SOLN: 10.0000 meq | INTRAVENOUS | Status: DC

## 2015-08-17 MED ORDER — POTASSIUM CHLORIDE 10 MEQ/50ML IV SOLN
10.0000 meq | INTRAVENOUS | Status: AC
  Administered 2015-08-17 (×2): 10 meq via INTRAVENOUS

## 2015-08-17 MED ORDER — POTASSIUM CHLORIDE IN NACL 20-0.9 MEQ/L-% IV SOLN
INTRAVENOUS | Status: AC
  Administered 2015-08-17: 100 mL via INTRAVENOUS
  Filled 2015-08-17: qty 1000

## 2015-08-17 MED ORDER — SODIUM CHLORIDE 0.9 % IJ SOLN
10.0000 mL | Freq: Two times a day (BID) | INTRAMUSCULAR | Status: DC
  Administered 2015-08-18: 30 mL
  Administered 2015-08-19 – 2015-08-21 (×4): 10 mL

## 2015-08-17 MED ORDER — INSULIN ASPART 100 UNIT/ML ~~LOC~~ SOLN
0.0000 [IU] | SUBCUTANEOUS | Status: DC
  Administered 2015-08-17 – 2015-08-18 (×3): 1 [IU] via SUBCUTANEOUS
  Administered 2015-08-18 (×3): 2 [IU] via SUBCUTANEOUS
  Administered 2015-08-18: 1 [IU] via SUBCUTANEOUS
  Administered 2015-08-18: 2 [IU] via SUBCUTANEOUS
  Administered 2015-08-19 (×2): 1 [IU] via SUBCUTANEOUS
  Administered 2015-08-19: 2 [IU] via SUBCUTANEOUS
  Administered 2015-08-20 (×2): 1 [IU] via SUBCUTANEOUS
  Administered 2015-08-20: 2 [IU] via SUBCUTANEOUS
  Administered 2015-08-20: 1 [IU] via SUBCUTANEOUS

## 2015-08-17 MED ORDER — METOPROLOL TARTRATE 1 MG/ML IV SOLN
2.5000 mg | INTRAVENOUS | Status: DC | PRN
Start: 2015-08-17 — End: 2015-08-24
  Administered 2015-08-17: 5 mg via INTRAVENOUS

## 2015-08-17 MED ORDER — CHLORHEXIDINE GLUCONATE 0.12 % MT SOLN
15.0000 mL | Freq: Two times a day (BID) | OROMUCOSAL | Status: DC
  Administered 2015-08-17 – 2015-08-24 (×14): 15 mL via OROMUCOSAL
  Filled 2015-08-17 (×7): qty 15

## 2015-08-17 MED ORDER — POTASSIUM CHLORIDE IN NACL 20-0.9 MEQ/L-% IV SOLN
INTRAVENOUS | Status: AC
  Administered 2015-08-17: 23:00:00 via INTRAVENOUS
  Filled 2015-08-17: qty 1000

## 2015-08-17 MED ORDER — TRACE MINERALS CR-CU-MN-SE-ZN 10-1000-500-60 MCG/ML IV SOLN
INTRAVENOUS | Status: AC
  Administered 2015-08-17: 18:00:00 via INTRAVENOUS
  Filled 2015-08-17: qty 960

## 2015-08-17 MED ORDER — AMIODARONE HCL IN DEXTROSE 360-4.14 MG/200ML-% IV SOLN
30.0000 mg/h | INTRAVENOUS | Status: DC
  Administered 2015-08-17 – 2015-08-19 (×5): 30 mg/h via INTRAVENOUS
  Filled 2015-08-17 (×12): qty 200

## 2015-08-17 MED ORDER — SODIUM CHLORIDE 0.9 % IJ SOLN
10.0000 mL | INTRAMUSCULAR | Status: DC | PRN
  Administered 2015-08-22 – 2015-08-24 (×4): 10 mL
  Filled 2015-08-17 (×4): qty 40

## 2015-08-17 MED ORDER — ANTISEPTIC ORAL RINSE SOLUTION (CORINZ)
7.0000 mL | Freq: Four times a day (QID) | OROMUCOSAL | Status: DC
  Administered 2015-08-17 – 2015-08-24 (×20): 7 mL via OROMUCOSAL

## 2015-08-17 NOTE — Clinical Social Work Note (Signed)
CSW spoke with patient and his daughter [name] in regards to bed offers.  Patient and daughter were given the bed offers, and informed which facilities are able to take TPN.  Patient and daughter would prefer to go to [facility], however CSW informed them that the SNF does not have any male beds available and they will not take TPN.  Patient and daughter were informed that [facility] in [place] did give an offer and can take TPN if he is still on it once he is ready for discharge.  CSW informed patient and daughter there are a few facilities in [facility] that will take TPN, and CSW asked if patient's information could be faxed out to [place] as well, the patient and daughter agreed.  CSW will fax patient out to [facility] for placement.  CSW to continue to follow patient's progress and discharge planning.  Jones Broom. [place], MSW, [place] 08/17/2015 5:16 PM

## 2015-08-17 NOTE — Progress Notes (Signed)
PARENTERAL NUTRITION CONSULT NOTE - INITIAL  Pharmacy Consult for TPN Indication: Probably post-op ileus  Allergies  Allergen Reactions  . Codeine Other (See Comments)    Does not know     Patient Measurements: Height: 5\' 9"  (175.3 cm) Weight: 234 lb 5.6 oz (106.3 kg) IBW/kg (Calculated) : 70.7  Adj BW: 80 kg Usual Weight: 109 kg  Vital Signs: Temp: 98.4 F (36.9 C) (09/08 0351) Temp Source: Oral (09/08 0351) BP: 144/65 mmHg (09/08 0700) Pulse Rate: 96 (09/08 0700) Intake/Output from previous day: 09/07 0701 - 09/08 0700 In: 680 [P.O.:200; I.V.:320; NG/GT:60; IV Piggyback:100] Out: 2450 [Urine:1050; Emesis/NG output:1400] Intake/Output from this shift:    Labs:  Recent Labs  08/15/15 0450 08/16/15 0457 08/17/15 0227  WBC 8.3 10.1 6.2  HGB 9.3* 10.6* 10.2*  HCT 28.4* 32.7* 31.3*  PLT 135* 214 223     Recent Labs  08/15/15 0450 08/16/15 0457 08/16/15 0929 08/17/15 0227  NA 139 139  --  140  K 3.5 3.7  --  3.7  CL 99* 100*  --  97*  CO2 31 28  --  31  GLUCOSE 80 104*  --  115*  BUN 38* 41*  --  41*  CREATININE 1.53* 1.33*  --  1.36*  CALCIUM 8.3* 9.0  --  8.5*  MG  --   --   --  2.1  PHOS  --   --   --  3.8  PROT 5.5*  --  6.2* 5.6*  ALBUMIN 2.4*  --  2.7* 2.4*  AST 31  --  41 32  ALT 19  --  24 23  ALKPHOS 43  --  54 48  BILITOT 0.8  --  1.4* 1.4*  BILIDIR  --   --  0.3  --   IBILI  --   --  1.1*  --   PREALBUMIN  --   --   --  10.1*   Estimated Creatinine Clearance: 55.5 mL/min (by C-G formula based on Cr of 1.36).    Recent Labs  08/16/15 1144 08/16/15 1643 08/16/15 2213  GLUCAP 104* 111* 107*    Medical History: Past Medical History  Diagnosis Date  . GERD (gastroesophageal reflux disease)   . Essential hypertension   . Hx of adenomatous colonic polyps   . Nephrolithiasis   . NSTEMI (non-ST elevated myocardial infarction) March 2013  . Obesity   . HLD (hyperlipidemia)     a. H/o multiple statin intolerances.  Marland Kitchen COPD  (chronic obstructive pulmonary disease)   . CAD (coronary artery disease)     DES to the RCA March 2013, residual 70-80% 1st marginal and a 60-70% mid LCX  . IBS (irritable bowel syndrome)    Insulin Requirements in the past 24 hours:  None  Nutritional Goals: Follow up RD recs 1000-1200 kCal 140-160 grams of protein per day Fluids: 2.4-2.6 L  Current Nutrition:  NPO  Assessment: 79 yo M presents on 8/29 with chest pain and CAD. PMH includes GERD, HTN, NSTEMI, HLD, COPD, CAD, IBS. Found to have unstable angina with severe multivessel CAD. Now s/p CABG x 4 on 9/2 with severe abdominal pain and projectile vomiting. Pt does not have any bowel sounds on exam and imaging shows probably ileus and possibility of SBO. Pharmacy consulted to start TPN on 9/8 for probable ileus. PICC line ordered.   Surgeries/Procedures: 9/2 CABG x 4  GI: CT of abdomen shows early / low grade small bowel obstruction versus an  ileus. Abdominal Xray shows dilated small bowel loops suggesting possibility of obstruction. Has had multiple events of vomiting large amounts of greenish-yellow liquid. NG tube was placed and immediately drained 1.4 L of dark green secretions. NG tube has put out about 836ml in last 24 hours. Plan to leave NG tube in for at least 24-48 hours. reglan, bisacodyl, lactulose  Endo: Was on Levemir and SSI for a few days. No active insulin orderes as CBGs well controlled with NPO status  Lytes: wnl exc K 3.7 (goal > 4). Phos and Mg ok (goal > 2). CoCa 9.6. Ca x Phos < 55  Renal: SCr trending down to 1.36 (peaked at 1.94), CrCl ~32ml/min. UOP low at 0.13ml/kg/hr. MIVF: NS w/ 20 of K @ 74ml/hr oxybutynin  Pulm: 1L of [place]  Cards: amio, aspirin, furosemide, lopressor  Hepatobil: Albumin low at 2.4, Prealbumin low at 10.1. LFTs wnl. Tbili slightly elevated at 1.4. Triglycerides 128 on 9/2  Neuro: Pain score 0. GCS 15. Fentanyl prn  ID: Afebrile, WBC wnl.  9/7 Imipenem >>    Best Practices:  PPI, Lovenox [place]  TPN Access: PICC line ordered yesterday but not in place yet  TPN start date: 9/8 >>  Plan:  Initiate Clinimix E 5/15 at 8ml/hr Hold IVFE for first 7 days for ICU patients per ASPEN guidelines (Day 1) TPN will provide 48 g of protein and 682 kcal which meets 32% of protein needs and 57% of kcal needs Will be unable to meet patient nutrition goals with Clinimix but will plan to maximize protein Add MVI and trace elements in TPN Add sensitive SSI and adjust as needed Decrease NS w/ 20 of K to 47ml/hr Give 25mEq of K x 4 runs peripherally as patient does not have PICC line placed yet Order TPN labs F/U Bmet, Phos, Mg tomorrow  Reginia Naas 08/17/2015,7:26 AM

## 2015-08-17 NOTE — Progress Notes (Signed)
6 Days Post-Op Procedure(s) (LRB): CORONARY ARTERY BYPASS GRAFTING (CABG) X4 UTILIZING THE LEFT INTERNAL MAMMARY ARTERY TO LAD AND ENDOSCOPICALLY HARVESTED BILATERAL SAPHENEOUS VEINS TO DIAGONAL,OM1 AND OM2 (N/A) TRANSESOPHAGEAL ECHOCARDIOGRAM (TEE) (N/A) Subjective: Feels better with NG drainage CT shows prob ileus Diminished bowel sounds TNA via PICC to start today Maintaining NSR CXR with small L effusion Objective: Vital signs in last 24 hours: Temp:  [97.6 F (36.4 C)-98.5 F (36.9 C)] 97.8 F (36.6 C) (09/08 0807) Pulse Rate:  [82-96] 93 (09/08 1000) Cardiac Rhythm:  [-] Normal sinus rhythm (09/08 0700) Resp:  [12-21] 21 (09/08 1000) BP: (118-161)/(63-108) 143/77 mmHg (09/08 1000) SpO2:  [93 %-97 %] 95 % (09/08 1000) Weight:  [234 lb 5.6 oz (106.3 kg)] 234 lb 5.6 oz (106.3 kg) (09/08 0600)  Hemodynamic parameters for last 24 hours:  stable  Intake/Output from previous day: 09/07 0701 - 09/08 0700 In: 680 [P.O.:200; I.V.:320; NG/GT:60; IV Piggyback:100] Out: 2450 [Urine:1050; Emesis/NG output:1400] Intake/Output this shift: Total I/O In: 450 [I.V.:450] Out: 500 [Urine:500]  Neuro intact abd soft  Lab Results:  Recent Labs  08/16/15 0457 08/17/15 0227  WBC 10.1 6.2  HGB 10.6* 10.2*  HCT 32.7* 31.3*  PLT 214 223   BMET:  Recent Labs  08/16/15 0457 08/17/15 0227  NA 139 140  K 3.7 3.7  CL 100* 97*  CO2 28 31  GLUCOSE 104* 115*  BUN 41* 41*  CREATININE 1.33* 1.36*  CALCIUM 9.0 8.5*    PT/INR: No results for input(s): LABPROT, INR in the last 72 hours. ABG    Component Value Date/Time   PHART 7.386 08/12/2015 0500   HCO3 24.9* 08/12/2015 0500   TCO2 21 08/12/2015 1715   ACIDBASEDEF 2.0 08/11/2015 1938   O2SAT 94.8 08/12/2015 0500   CBG (last 3)   Recent Labs  08/16/15 1643 08/16/15 2213 08/17/15 0805  GLUCAP 111* 107* 121*    Assessment/Plan: S/P Procedure(s) (LRB): CORONARY ARTERY BYPASS GRAFTING (CABG) X4 UTILIZING THE LEFT  INTERNAL MAMMARY ARTERY TO LAD AND ENDOSCOPICALLY HARVESTED BILATERAL SAPHENEOUS VEINS TO DIAGONAL,OM1 AND OM2 (N/A) TRANSESOPHAGEAL ECHOCARDIOGRAM (TEE) (N/A) Cont NG drainage,  And start TNA today   LOS: 8 days    Tharon Aquas Trigt III 08/17/2015

## 2015-08-17 NOTE — Progress Notes (Addendum)
TCTS DAILY ICU PROGRESS NOTE                   [address]            [place] [id number]          [phone]   6 Days Post-Op Procedure(s) (LRB): CORONARY ARTERY BYPASS GRAFTING (CABG) X4 UTILIZING THE LEFT INTERNAL MAMMARY ARTERY TO LAD AND ENDOSCOPICALLY HARVESTED BILATERAL SAPHENEOUS VEINS TO DIAGONAL,OM1 AND OM2 (N/A) TRANSESOPHAGEAL ECHOCARDIOGRAM (TEE) (N/A)  Total Length of Stay:  LOS: 8 days   Subjective: Feeling better this am, no more nausea or vomiting. Had 2 soft BMs earlier.    Objective: Vital signs in last 24 hours: Temp:  [97.6 F (36.4 C)-98.5 F (36.9 C)] 98.4 F (36.9 C) (09/08 0351) Pulse Rate:  [82-96] 96 (09/08 0700) Cardiac Rhythm:  [-] Normal sinus rhythm (09/08 0700) Resp:  [12-18] 15 (09/08 0600) BP: (118-161)/(63-108) 144/65 mmHg (09/08 0700) SpO2:  [93 %-97 %] 93 % (09/08 0700) Weight:  [234 lb 5.6 oz (106.3 kg)] 234 lb 5.6 oz (106.3 kg) (09/08 0600)  Filed Weights   08/15/15 0500 08/16/15 0354 08/17/15 0600  Weight: 241 lb 6.5 oz (109.5 kg) 244 lb 4.8 oz (110.814 kg) 234 lb 5.6 oz (106.3 kg)    Weight change: -9 lb 15.2 oz (-4.514 kg)   Hemodynamic parameters for last 24 hours:    Intake/Output from previous day: 09/07 0701 - 09/08 0700 In: 680 [P.O.:200; I.V.:320; NG/GT:60; IV Piggyback:100] Out: 2450 [Urine:1050; Emesis/NG output:1400]  Intake/Output this shift:    Current Meds: Scheduled Meds: . amiodarone  400 mg Oral BID  . antiseptic oral rinse  7 mL Mouth Rinse q12n4p  . antiseptic oral rinse  7 mL Mouth Rinse QID  . aspirin EC  325 mg Oral Daily   Or  . aspirin  324 mg Per Tube Daily  . bisacodyl  10 mg Oral Daily   Or  . bisacodyl  10 mg Rectal Daily  . chlorhexidine  15 mL Mouth/Throat BID  . docusate sodium  200 mg Oral Daily  . enoxaparin (LOVENOX) injection  40 mg Subcutaneous QHS  . furosemide  40 mg Intravenous Daily  . imipenem-cilastatin  500 mg Intravenous 3 times per day  . lactulose   30 g Oral Daily  . metoCLOPramide (REGLAN) injection  10 mg Intravenous 4 times per day  . metoprolol tartrate  12.5 mg Oral BID   Or  . metoprolol tartrate  12.5 mg Per Tube BID  . oxybutynin  10 mg Oral QHS  . pantoprazole (PROTONIX) IV  40 mg Intravenous Q24H  . sodium chloride  3 mL Intravenous Q12H  . sodium chloride  3 mL Intravenous Q12H   Continuous Infusions: . 0.9 % NaCl with KCl 20 mEq / L 75 mL/hr at 08/17/15 0400   PRN Meds:.sodium chloride, fentaNYL (SUBLIMAZE) injection, levalbuterol, magnesium hydroxide, metoprolol, ondansetron (ZOFRAN) IV, sodium chloride, sodium chloride   Physical Exam: General appearance: alert, cooperative and no distress Heart: regular rate and rhythm Lungs: clear to auscultation bilaterally Abdomen: Soft, still somewhat distended and tender, +BS though decreased Extremities: Mild LE edema Wound: Clean and dry  Lab Results: CBC: Recent Labs  08/16/15 0457 08/17/15 0227  WBC 10.1 6.2  HGB 10.6* 10.2*  HCT 32.7* 31.3*  PLT 214 223   BMET:  Recent Labs  08/16/15 0457 08/17/15 0227  NA 139 140  K 3.7 3.7  CL 100* 97*  CO2  28 31  GLUCOSE 104* 115*  BUN 41* 41*  CREATININE 1.33* 1.36*  CALCIUM 9.0 8.5*    PT/INR: No results for input(s): LABPROT, INR in the last 72 hours. Radiology: Ct Abdomen Pelvis Wo Contrast  08/16/2015   CLINICAL DATA:  Jacqulynn Cadet x-ray 30 oz stable breasts stable likely however lack effusion wake structures are air visualized lower item LL junction. The periphery is fifth image viewing stop  EXAM: CT ABDOMEN AND PELVIS WITHOUT CONTRAST  TECHNIQUE: Multidetector CT imaging of the abdomen and pelvis was performed following the standard protocol without IV contrast.  COMPARISON:  Abdominal radiograph dated 08/16/2015  FINDINGS: Evaluation of this exam is limited in the absence of intravenous contrast. Evaluation is also limited due to streak artifact caused by patient's arms.  Partially visualized small left pleural  effusion. There is associated partial compressive atelectasis of the visualized left lower lobe. Superimposed pneumonia is not excluded. Clinical correlation and follow-up to resolution is recommended. Trace right pleural effusion may be present. Median sternotomy wires a transcutaneous cardiac leads noted with small pockets of air in the anterior cardiophrenic fat pad likely related to recent surgery. Trace fluid noted along the posterior mediastinum. No significant fluid collection identified.  No intra-abdominal free air or free fluid.  The liver, gallbladder, pancreas, spleen, adrenal glands, kidneys, visualized ureters, and urinary bladder appear unremarkable. Small pocket of air within the urinary bladder likely related to recent instrumentation. The prostate and seminal vesicles are grossly unremarkable.  An enteric tube is noted with tip in the stomach. Multiple mildly dilated fluid-filled loops of small bowel noted in the upper and mid abdomen. The distal small bowel and terminal ileum are collapsed. A transition zone may be present in the right anterior abdomen. Findings compatible is an elderly versus low-grade small bowel obstruction or segmental ileus. Clinical correlation and follow-up recommended. Sigmoid and colonic diverticulosis without active inflammation. Appendectomy.  There is aortoiliac atherosclerotic disease. There is a 2.6 cm ectasia of the distal aorta. 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 No portal venous gas identified. There is no lymphadenopathy.  There is mild subcutaneous soft tissue stranding of the gluteal regions. Degenerative changes of the spine. Bilateral L5 pars defects. Multilevel disc desiccation with vacuum phenomenon most prominent at L5-S1. No acute fracture.  IMPRESSION: An early/ low grade small bowel obstruction versus an ileus. Clinical correlation and follow-up recommended.  Diverticulosis.  Partially visualized small left pleural  effusion with associated compressive atelectasis of the left lower lobe. Pneumonia is not excluded. Clinical correlation is recommended.   Electronically Signed   By: Anner Crete M.D.   On: 08/16/2015 21:55   Dg Chest 2 View  08/16/2015   CLINICAL DATA:  Status post CABG  EXAM: CHEST  2 VIEW  COMPARISON:  08/15/2015  FINDINGS: Status post CABG with enlarged cardiac silhouette. NG tube crosses the gastroesophageal junction. Mild atelectasis right base. Opacity left base suggesting effusion and consolidation likely atelectasis.  Mild 15 years prominence suggesting mild pulmonary edema.  IMPRESSION: Stable bibasilar opacities.  Mild interstitial pulmonary edema.   Electronically Signed   By: Skipper Cliche M.D.   On: 08/16/2015 23:45   Dg Abd 1 View  08/16/2015   CLINICAL DATA:  Nasogastric tube placement  EXAM: ABDOMEN - 1 VIEW  COMPARISON:  08/16/2015  FINDINGS: Dilated small bowel loops again suggest possibility of obstruction. NG tube projects with tip over the stomach.  IMPRESSION: NG tube as described  Electronically Signed   By: Skipper Cliche M.D.   On: 08/16/2015 21:54   Dg Abd Portable 1v  08/16/2015   CLINICAL DATA:  Irritable bowel syndrome.  EXAM: PORTABLE ABDOMEN - 1 VIEW  COMPARISON:  None.  FINDINGS: Soft tissue structures are unremarkable. Air-filled loops of bowel, possibly small bowel are noted over the mid abdomen. Follow-up abdominal series is suggested to exclude developing bowel obstruction. No free air. No pneumatosis. Degenerative changes lumbar spine.  IMPRESSION: Air-filled loops of slightly distended bowel, possibly small bowel, are noted over the mid abdomen. Follow-up abdominal series is suggested to exclude developing small bowel obstruction . No free air.   Electronically Signed   By: Marcello Moores  Register   On: 08/16/2015 08:48     Assessment/Plan: S/P Procedure(s) (LRB): CORONARY ARTERY BYPASS GRAFTING (CABG) X4 UTILIZING THE LEFT INTERNAL MAMMARY ARTERY TO LAD AND  ENDOSCOPICALLY HARVESTED BILATERAL SAPHENEOUS VEINS TO DIAGONAL,OM1 AND OM2 (N/A) TRANSESOPHAGEAL ECHOCARDIOGRAM (TEE) (N/A)  Ileus- subjectively improved after BMs. NG still with increased output (1400 ml total overnight).  Continue NPO, NG decompression for now. Abdominal XR ordered this am, but not done yet.  CV- SR, BPs generally stable. Continue low dose beta blocker.  Pulm- IS, pulm toilet. CXR ordered, not done yet.  Renal- Cr mildly elevated but stable. Continue to monitor.  Nutrition- TPN per pharmacy.  Mobilize as tolerated.    Tyler Mejia H 08/17/2015 8:01 AM

## 2015-08-17 NOTE — Plan of Care (Signed)
Problem: Phase II - Intermediate Post-Op Goal: Advance Diet Outcome: Not Progressing Post op ileus, NG in to LIWS, pt NPO

## 2015-08-17 NOTE — Progress Notes (Signed)
Initial Nutrition Assessment  DOCUMENTATION CODES:   Obesity unspecified  INTERVENTION:    TPN per pharmacy  NUTRITION DIAGNOSIS:   Inadequate oral intake related to altered GI function as evidenced by NPO status  GOAL:   Patient will meet greater than or equal to 90% of their needs  MONITOR:   Diet advancement, PO intake, Labs, Weight trends, I & O's  REASON FOR ASSESSMENT:   Consult New TPN/TNA  ASSESSMENT:   79 y.o. Male admitted with severe CAD.  Patient s/p procedure 9/2: CORONARY ARTERY BYPASS GRAFTING x 4  Pt currently in [address].  Pt with post-op ileus.  + severe abdominal pain, projectile vomiting.  NGT in place to LIS.    Patient to receive TPN via PICC with Clinimix E 5/15 @ 40 ml/hr.  No IVFE at this time.  Provides 682 kcal and 48 grams protein per day. Meets 30% minimum estimated energy needs and 38% minimum estimated protein needs.  Diet Order:  Diet NPO time specified Except for: Ice Chips .TPN (CLINIMIX-E) Adult  Skin:  Reviewed, no issues  Last BM:  9/8  Height:   Ht Readings from Last 1 Encounters:  08/09/15 5\' 9"  (1.753 m)    Weight:   Wt Readings from Last 1 Encounters:  08/17/15 234 lb 5.6 oz (106.3 kg)    Ideal Body Weight:  73 kg  BMI:  Body mass index is 34.59 kg/(m^2).  Estimated Nutritional Needs:   Kcal:  9728-2060  Protein:  125-135 gm  Fluid:  per MD  EDUCATION NEEDS:   No education needs identified at this time  Arthur Holms, RD, LDN Pager #: [phone] After-Hours Pager #: [phone]

## 2015-08-17 NOTE — Progress Notes (Signed)
TCTS BRIEF SICU PROGRESS NOTE  6 Days Post-Op  S/P Procedure(s) (LRB): CORONARY ARTERY BYPASS GRAFTING (CABG) X4 UTILIZING THE LEFT INTERNAL MAMMARY ARTERY TO LAD AND ENDOSCOPICALLY HARVESTED BILATERAL SAPHENEOUS VEINS TO DIAGONAL,OM1 AND OM2 (N/A) TRANSESOPHAGEAL ECHOCARDIOGRAM (TEE) (N/A)   Stable day  Maintaining NSR w/ stable BP Diuresing well   Plan: Continue current plan  Tyler Mejia 08/17/2015 7:46 PM

## 2015-08-17 NOTE — Progress Notes (Signed)
Peripherally Inserted Central Catheter/Midline Placement  The IV Nurse has discussed with the patient and/or persons authorized to consent for the patient, the purpose of this procedure and the potential benefits and risks involved with this procedure.  The benefits include less needle sticks, lab draws from the catheter and patient may be discharged home with the catheter.  Risks include, but not limited to, infection, bleeding, blood clot (thrombus formation), and puncture of an artery; nerve damage and irregular heat beat.  Alternatives to this procedure were also discussed.  PICC/Midline Placement Documentation        Tyler Mejia, Nicolette Bang 08/17/2015, 2:52 PM

## 2015-08-17 NOTE — Progress Notes (Signed)
Physical Therapy Treatment Patient Details Name: Tyler Mejia MRN: [id number] DOB: 1936/04/07 Today's Date: 08/17/2015    History of Present Illness Patient is a 79 yo male s/p CORONARY ARTERY BYPASS GRAFTING (CABG) X4 ; developed ileus with severe vomiting; moved back to ICU with NG tube    PT Comments    Patient continues to require education regarding safe and proper use of DME and to adhere to sternal precautions during mobility.   Follow Up Recommendations  SNF     Equipment Recommendations  Rolling walker with 5" wheels    Recommendations for Other Services OT consult     Precautions / Restrictions Precautions Precautions: Sternal;Fall Precaution Comments: Patient able to recall precautions, however requires cues to maintain precautions with functional mobility.  Restrictions Weight Bearing Restrictions:  (sternal prec.)    Mobility  Bed Mobility Overal bed mobility: Needs Assistance Bed Mobility: Supine to Sit     Supine to sit: Mod assist;HOB elevated     General bed mobility comments: Moderate assist to elevate trunk and rotate to EOB (as pt had kicked legs off bed despite instruction to roll to his side).  Transfers Overall transfer level: Needs assistance Equipment used: Pushed w/c Transfers: Sit to/from Stand Sit to Stand: Min assist         General transfer comment: Min assist to power up from EOB. VCs for positioning and use of LEs to power up holding sternal pillow for splinting of chest during transition  Ambulation/Gait Ambulation/Gait assistance: Min guard Ambulation Distance (Feet): 450 Feet Assistive device:  (push w/c) Gait Pattern/deviations: Step-through pattern;Decreased stride length;Trunk flexed Gait velocity: decreased   General Gait Details: On 2L, pt steady and encouraged to put less weight through his arms (holding w/c handles). vc for upright posture; encouragement to incr distance   Stairs            Wheelchair  Mobility    Modified Rankin (Stroke Patients Only)       Balance     Sitting balance-Leahy Scale: Fair Sitting balance - Comments: able to maintain EOb without physical assist   Standing balance support: Bilateral upper extremity supported Standing balance-Leahy Scale: Poor                      Cognition Arousal/Alertness: Awake/alert Behavior During Therapy: WFL for tasks assessed/performed Overall Cognitive Status: Within Functional Limits for tasks assessed                      Exercises      General Comments        Pertinent Vitals/Pain Pain Assessment: No/denies pain    Home Living                      Prior Function            PT Goals (current goals can now be found in the care plan section) Acute Rehab PT Goals Patient Stated Goal: to get stronger Time For Goal Achievement: 08/29/15 Progress towards PT goals: Progressing toward goals (after medical set-back)    Frequency  Min 3X/week    PT Plan Current plan remains appropriate    Co-evaluation             End of Session Equipment Utilized During Treatment: Gait belt;Oxygen Activity Tolerance: Patient tolerated treatment well Patient left: in chair;with call bell/phone within reach (VSS on room air >93%, on 2 liters >95%)  Time: 2841-3244 PT Time Calculation (min) (ACUTE ONLY): 31 min  Charges:  $Gait Training: 23-37 mins                    G Codes:      Nyella Eckels 08-19-15, 1:31 PM Pager [phone]

## 2015-08-17 NOTE — Progress Notes (Signed)
0850 to xray via WC, o2 and monitor 0915 from xray same fashion

## 2015-08-18 ENCOUNTER — Inpatient Hospital Stay (HOSPITAL_COMMUNITY): Payer: Medicare Other

## 2015-08-18 DIAGNOSIS — R633 Feeding difficulties: Secondary | ICD-10-CM

## 2015-08-18 LAB — CBC
HCT: 30.2 % — ABNORMAL LOW (ref 39.0–52.0)
Hemoglobin: 9.9 g/dL — ABNORMAL LOW (ref 13.0–17.0)
MCH: 28.4 pg (ref 26.0–34.0)
MCHC: 32.8 g/dL (ref 30.0–36.0)
MCV: 86.8 fL (ref 78.0–100.0)
Platelets: 224 10*3/uL (ref 150–400)
RBC: 3.48 MIL/uL — ABNORMAL LOW (ref 4.22–5.81)
RDW: 13.7 % (ref 11.5–15.5)
WBC: 7.6 10*3/uL (ref 4.0–10.5)

## 2015-08-18 LAB — COMPREHENSIVE METABOLIC PANEL
ALT: 17 U/L (ref 17–63)
AST: 24 U/L (ref 15–41)
Albumin: 2.2 g/dL — ABNORMAL LOW (ref 3.5–5.0)
Alkaline Phosphatase: 43 U/L (ref 38–126)
Anion gap: 5 (ref 5–15)
BUN: 35 mg/dL — ABNORMAL HIGH (ref 6–20)
CO2: 35 mmol/L — ABNORMAL HIGH (ref 22–32)
Calcium: 8.3 mg/dL — ABNORMAL LOW (ref 8.9–10.3)
Chloride: 106 mmol/L (ref 101–111)
Creatinine, Ser: 1.14 mg/dL (ref 0.61–1.24)
GFR calc Af Amer: >60 mL/min (ref >60)
GFR calc non Af Amer: >60 mL/min (ref >60)
Glucose, Bld: 149 mg/dL — ABNORMAL HIGH (ref 65–99)
Potassium: 3.4 mmol/L — ABNORMAL LOW (ref 3.5–5.1)
Sodium: 146 mmol/L — ABNORMAL HIGH (ref 135–145)
Total Bilirubin: 0.9 mg/dL (ref 0.3–1.2)
Total Protein: 5.8 g/dL — ABNORMAL LOW (ref 6.5–8.1)

## 2015-08-18 LAB — GLUCOSE, CAPILLARY
GLUCOSE-CAPILLARY: 141 mg/dL — AB (ref 65–99)
GLUCOSE-CAPILLARY: 142 mg/dL — AB (ref 65–99)
GLUCOSE-CAPILLARY: 142 mg/dL — AB (ref 65–99)
Glucose-Capillary: 143 mg/dL — ABNORMAL HIGH (ref 65–99)
Glucose-Capillary: 136 mg/dL — ABNORMAL HIGH (ref 65–99)
Glucose-Capillary: 132 mg/dL — ABNORMAL HIGH (ref 65–99)

## 2015-08-18 LAB — MAGNESIUM: Magnesium: 2.4 mg/dL (ref 1.7–2.4)

## 2015-08-18 LAB — PHOSPHORUS: Phosphorus: 2.5 mg/dL (ref 2.5–4.6)

## 2015-08-18 MED ORDER — POTASSIUM CHLORIDE 10 MEQ/50ML IV SOLN
10.0000 meq | INTRAVENOUS | Status: AC
  Administered 2015-08-18 (×4): 10 meq via INTRAVENOUS
  Filled 2015-08-18 (×4): qty 50

## 2015-08-18 MED ORDER — TRACE MINERALS CR-CU-MN-SE-ZN 10-1000-500-60 MCG/ML IV SOLN
INTRAVENOUS | Status: AC
  Administered 2015-08-18: 17:00:00 via INTRAVENOUS
  Filled 2015-08-18: qty 1440

## 2015-08-18 MED ORDER — SODIUM CHLORIDE 0.9 % IV SOLN
INTRAVENOUS | Status: DC
  Administered 2015-08-18: 10 mL/h via INTRAVENOUS

## 2015-08-18 MED FILL — Dexmedetomidine HCl in NaCl 0.9% IV Soln 400 MCG/100ML: INTRAVENOUS | Qty: 100 | Status: AC

## 2015-08-18 MED FILL — Heparin Sodium (Porcine) Inj 1000 Unit/ML: INTRAMUSCULAR | Qty: 30 | Status: AC

## 2015-08-18 MED FILL — Potassium Chloride Inj 2 mEq/ML: INTRAVENOUS | Qty: 40 | Status: AC

## 2015-08-18 MED FILL — Magnesium Sulfate Inj 50%: INTRAMUSCULAR | Qty: 10 | Status: AC

## 2015-08-18 NOTE — Progress Notes (Signed)
[place] for TPN Indication: Probably post-op ileus  Allergies  Allergen Reactions  . Codeine Other (See Comments)    Does not know     Patient Measurements: Height: 5\' 9"  (175.3 cm) Weight: 229 lb 8 oz (104.1 kg) IBW/kg (Calculated) : 70.7  Adj BW: 80 kg Usual Weight: 109 kg  Vital Signs: Temp: 98 F (36.7 C) (09/09 0756) Temp Source: Oral (09/09 0756) BP: 159/80 mmHg (09/09 0800) Pulse Rate: 78 (09/09 0800) Intake/Output from previous day: 09/08 0701 - 09/09 0700 In: 2274.5 [I.V.:1459.2; NG/GT:30; IV Piggyback:250; TPN:535.3] Out: 2475 [Urine:1775; Emesis/NG output:700] Intake/Output from this shift: Total I/O In: 91.7 [I.V.:51.7; TPN:40] Out: 200 [Urine:200]  Labs:  Recent Labs  08/16/15 0457 08/17/15 0227 08/17/15 1544 08/18/15 0334  WBC 10.1 6.2  --  7.6  HGB 10.6* 10.2* 10.5* 9.9*  HCT 32.7* 31.3* 31.0* 30.2*  PLT 214 223  --  224     Recent Labs  08/16/15 0457 08/16/15 0929 08/17/15 0227 08/17/15 1544 08/18/15 0334  NA 139  --  140 143 146*  K 3.7  --  3.7 3.5 3.4*  CL 100*  --  97* 100* 106  CO2 28  --  31  --  35*  GLUCOSE 104*  --  115* 121* 149*  BUN 41*  --  41* 38* 35*  CREATININE 1.33*  --  1.36* 1.40* 1.14  CALCIUM 9.0  --  8.5*  --  8.3*  MG  --   --  2.1  --  2.4  PHOS  --   --  3.8  --  2.5  PROT  --  6.2* 5.6*  --  5.8*  ALBUMIN  --  2.7* 2.4*  --  2.2*  AST  --  41 32  --  24  ALT  --  24 23  --  17  ALKPHOS  --  54 48  --  43  BILITOT  --  1.4* 1.4*  --  0.9  BILIDIR  --  0.3  --   --   --   IBILI  --  1.1*  --   --   --   PREALBUMIN  --   --  10.1*  --   --    Estimated Creatinine Clearance: 65.6 mL/min (by C-G formula based on Cr of 1.14).    Recent Labs  08/17/15 2104 08/17/15 2338 08/18/15 0342  GLUCAP 136* 141* 143*   Insulin Requirements in the past 24 hours:  6 units SSI  Nutritional Goals: per RD 9/8 2300-2500 kcal and 125 - 135 gm protein   Current  Nutrition:  NPO Clinimix E 5/15 at 40 ml/hr  Assessment: 79 yo M presents on 8/29 with chest pain and CAD. PMH includes GERD, HTN, NSTEMI, HLD, COPD, CAD, IBS. Found to have unstable angina with severe multivessel CAD. Now s/p CABG x 4 on 9/2 with severe abdominal pain and projectile vomiting. Pt does not have any bowel sounds on exam and imaging shows probably ileus and possibility of SBO. Pharmacy consulted to start TPN on 9/8 for probable ileus. PICC line placed 9/8 for TPN. 9/9: POD #7,  Passing flatus, no more N/V, NGO down to 700 from 1400 mls. 9/9 KUB: persistent mild small bowel distention, but clinically improving. To continue NPO and NGT for today.    Surgeries/Procedures: 9/2 CABG x 4  GI: ileus is clinically improving, NPO/NGT continues  Endo: Was on Levemir and SSI for  a few days. Got 6 units SSI after TPN started at 40 ml/hr but all CBGs < 150.   Lytes: Na 146,  K 3.4 - got 2 runs K yesterday per TCTS (goal > 4)- to get runs per TCTS.  Phos and Mg ok (goal > 2). CoCa 9.74 . Ca x Phos < 55  Renal: SCr trending down to 1.14  (peaked at 1.94), UOP 0.7 ml/kg/hr. MIVF: NS w/ 20 of K @35ml /hr, on oxybutynin  Pulm: 1L of [place]  Cards: amio, aspirin, furosemide, lopressor  Hepatobil: Albumin low at 2.2, Prealbumin low at 10.1. LFTs wnl. Tbili improved to 0.9 from 1.4. Triglycerides 128 on 9/2  Neuro: Pain score 0. GCS 15. Fentanyl prn  ID: Afebrile, WBC wnl.  9/7 Imipenem >>    Best Practices: PPI, Lovenox Natural Steps  TPN Access: PICC placed 9/8 for TPN  TPN start date: 9/8 >>  Plan:  Needs K runs but has TPN in one lumen and IV amio in the other lumen so no place to give runs, d/w RN - she will ask MD about placing a new CL Increase Clinimix E 5/15 to 60 ml/hr Hold IVFE for first 7 days for ICU patients per ASPEN guidelines (Day 2) TPN will provide 72 g of protein and 1022 kcal  Will be unable to meet patient nutrition goals with Clinimix but will plan to maximize  protein Continue MVI and trace elements in TPN continue sensitive SSI   DC MIVF ( ends at 11 am, only ordered x 18 hrs) AM BMET, Mag, Phos  Eudelia Bunch, Pharm.D. [phone] 08/18/2015 8:50 AM

## 2015-08-18 NOTE — Progress Notes (Signed)
Physical Therapy Treatment Patient Details Name: Tyler Mejia MRN: [id number] DOB: 09/30/1936 Today's Date: 08/18/2015    History of Present Illness Patient is a 79 yo male s/p CORONARY ARTERY BYPASS GRAFTING (CABG) X4 ; developed ileus with severe vomiting; moved back to ICU with NG tube    PT Comments    Patient with NG tube clamped and TPN has been started since last seen for PT. Pt is very motivated, however did not feel as well as last visit and walked shorter distance. Agree with plan for SNF on discharge as he lives alone.   Follow Up Recommendations  SNF     Equipment Recommendations  Rolling walker with 5" wheels    Recommendations for Other Services OT consult     Precautions / Restrictions Precautions Precautions: Sternal;Fall Precaution Comments: Patient able to recall precautions, however requires cues to maintain precautions with functional mobility.  Restrictions Weight Bearing Restrictions:  (sternal prec.)    Mobility  Bed Mobility Overal bed mobility: Needs Assistance Bed Mobility: Rolling;Sidelying to Sit Rolling: Min assist Sidelying to sit: Mod assist;HOB elevated (HOB 15)       General bed mobility comments: pt holds pillow to chest to prevent use of UEs and splint sternum; assist to use torso (instead of arms) to complete motions  Transfers Overall transfer level: Needs assistance Equipment used: Rolling walker (2 wheeled) Transfers: Sit to/from Stand Sit to Stand: Min assist;Min guard         General transfer comment: VCs for positioning and use of LEs to power up holding sternal pillow for splinting of chest during transition  Ambulation/Gait Ambulation/Gait assistance: Min assist;+2 safety/equipment Ambulation Distance (Feet): 220 Feet Assistive device: Rolling walker (2 wheeled) (push w/c) Gait Pattern/deviations: Step-through pattern;Trunk flexed Gait velocity: decreased   General Gait Details: initial dizziness, checked standing  BP and not orthostatic; dizziness improved and tolerated walking on room air very well   Stairs            Wheelchair Mobility    Modified Rankin (Stroke Patients Only)       Balance     Sitting balance-Leahy Scale: Good Sitting balance - Comments: able to maintain EOb without physical assist   Standing balance support: No upper extremity supported Standing balance-Leahy Scale: Poor Standing balance comment: stagger step when standing as lines arranged and pt holding pillow to chest; assist to recover                    Cognition Arousal/Alertness: Awake/alert Behavior During Therapy: WFL for tasks assessed/performed Overall Cognitive Status: Within Functional Limits for tasks assessed                      Exercises      General Comments        Pertinent Vitals/Pain Pain Assessment: 0-10 Pain Score: 4  Pain Location: chest Pain Descriptors / Indicators: Sore Pain Intervention(s): Limited activity within patient's tolerance;Monitored during session;Repositioned    Home Living                      Prior Function            PT Goals (current goals can now be found in the care plan section) Acute Rehab PT Goals Patient Stated Goal: to get stronger Time For Goal Achievement: 08/29/15 Progress towards PT goals: Not progressing toward goals - comment (cont's with medical setbacks; very motivated)    Frequency  Min 3X/week  PT Plan Current plan remains appropriate    Co-evaluation             End of Session Equipment Utilized During Treatment: Gait belt Activity Tolerance: Patient tolerated treatment well Patient left: in chair;with call bell/phone within reach     Time: 1326-1358 PT Time Calculation (min) (ACUTE ONLY): 32 min  Charges:  $Gait Training: 23-37 mins                    G Codes:      Tyler Mejia 2015-09-13, 2:12 PM  Pager [phone]

## 2015-08-18 NOTE — Progress Notes (Signed)
Patient ID: Tyler Mejia, male   DOB: 01/04/1936, 79 y.o.   MRN: [id number]  SICU Evening rounds:  Hemodynamically stable in sinus rhythm.  NG removed and taking some ice chips  Urine output ok  Stable day.

## 2015-08-18 NOTE — Progress Notes (Addendum)
TCTS DAILY ICU PROGRESS NOTE                   [address]            [place] [address]          [phone]   7 Days Post-Op Procedure(s) (LRB): CORONARY ARTERY BYPASS GRAFTING (CABG) X4 UTILIZING THE LEFT INTERNAL MAMMARY ARTERY TO LAD AND ENDOSCOPICALLY HARVESTED BILATERAL SAPHENEOUS VEINS TO DIAGONAL,OM1 AND OM2 (N/A) TRANSESOPHAGEAL ECHOCARDIOGRAM (TEE) (N/A)  Total Length of Stay:  LOS: 9 days   Subjective: Continues to slowly improved. Passing flatus, no more nausea/vomiting.    Objective: Vital signs in last 24 hours: Temp:  [97.8 F (36.6 C)-98.5 F (36.9 C)] 98 F (36.7 C) (09/09 0756) Pulse Rate:  [75-95] 75 (09/09 0700) Cardiac Rhythm:  [-] Normal sinus rhythm (09/09 0400) Resp:  [14-21] 15 (09/09 0700) BP: (133-172)/(62-110) 155/75 mmHg (09/09 0700) SpO2:  [91 %-99 %] 97 % (09/09 0700) Weight:  [229 lb 8 oz (104.1 kg)] 229 lb 8 oz (104.1 kg) (09/09 0500)  Filed Weights   08/16/15 0354 08/17/15 0600 08/18/15 0500  Weight: 244 lb 4.8 oz (110.814 kg) 234 lb 5.6 oz (106.3 kg) 229 lb 8 oz (104.1 kg)    Weight change: -4 lb 13.6 oz (-2.2 kg)   Hemodynamic parameters for last 24 hours:    Intake/Output from previous day: 09/08 0701 - 09/09 0700 In: 2274.5 [I.V.:1459.2; NG/GT:30; IV Piggyback:250; TPN:535.3] Out: 2475 [Urine:1775; Emesis/NG output:700]  Intake/Output this shift: Total I/O In: -  Out: 200 [Urine:200]  Current Meds: Scheduled Meds: . antiseptic oral rinse  7 mL Mouth Rinse q12n4p  . antiseptic oral rinse  7 mL Mouth Rinse QID  . aspirin EC  325 mg Oral Daily   Or  . aspirin  324 mg Per Tube Daily  . bisacodyl  10 mg Oral Daily   Or  . bisacodyl  10 mg Rectal Daily  . chlorhexidine  15 mL Mouth/Throat BID  . docusate sodium  200 mg Oral Daily  . enoxaparin (LOVENOX) injection  40 mg Subcutaneous QHS  . furosemide  40 mg Intravenous Daily  . imipenem-cilastatin  500 mg Intravenous 3 times per day  . insulin aspart   0-9 Units Subcutaneous 6 times per day  . metoCLOPramide (REGLAN) injection  10 mg Intravenous 4 times per day  . metoprolol tartrate  12.5 mg Oral BID   Or  . metoprolol tartrate  12.5 mg Per Tube BID  . oxybutynin  10 mg Oral QHS  . pantoprazole (PROTONIX) IV  40 mg Intravenous Q24H  . sodium chloride  10-40 mL Intracatheter Q12H  . sodium chloride  3 mL Intravenous Q12H  . sodium chloride  3 mL Intravenous Q12H   Continuous Infusions: . Marland KitchenTPN (CLINIMIX-E) Adult 40 mL/hr at 08/17/15 1737  . 0.9 % NaCl with KCl 20 mEq / L 35 mL/hr at 08/17/15 2245  . amiodarone 30 mg/hr (08/18/15 0337)   PRN Meds:.sodium chloride, fentaNYL (SUBLIMAZE) injection, levalbuterol, magnesium hydroxide, metoprolol, ondansetron (ZOFRAN) IV, sodium chloride, sodium chloride, sodium chloride  Physical Exam: General appearance: alert, cooperative and no distress Heart: regular rate and rhythm Lungs: clear to auscultation bilaterally Abdomen: Still mildly distended and tender but soft, BS more active Extremities: Mild LE edema Wound: Clean and dry   Lab Results: CBC: Recent Labs  08/17/15 0227 08/17/15 1544 08/18/15 0334  WBC 6.2  --  7.6  HGB 10.2* 10.5* 9.9*  HCT 31.3* 31.0* 30.2*  PLT 223  --  224   BMET:  Recent Labs  08/17/15 0227 08/17/15 1544 08/18/15 0334  NA 140 143 146*  K 3.7 3.5 3.4*  CL 97* 100* 106  CO2 31  --  35*  GLUCOSE 115* 121* 149*  BUN 41* 38* 35*  CREATININE 1.36* 1.40* 1.14  CALCIUM 8.5*  --  8.3*    PT/INR: No results for input(s): LABPROT, INR in the last 72 hours. Radiology: Dg Chest 2 View  08/17/2015   CLINICAL DATA:  CABG.  EXAM: CHEST  2 VIEW  COMPARISON:  None.  FINDINGS: NG tube noted with tip projected over the stomach. Prior CABG . Cardiomegaly. No pulmonary venous congestion. Small left lower lobe infiltrate and left pleural effusion. No pneumothorax . No acute bony abnormality.  IMPRESSION: 1. Prior CABG.  Cardiomegaly. 2. Left lower lobe infiltrate.  Pneumonia cannot be excluded. Associated small left pleural effusion.   Electronically Signed   By: Marcello Moores  Register   On: 08/17/2015 09:18   Dg Chest Port 1 View  08/18/2015   CLINICAL DATA:  Status post CABG on August 11, 2015  EXAM: PORTABLE CHEST - 1 VIEW  COMPARISON:  PA and lateral chest x-ray of August 17, 2015  FINDINGS: The lungs are well-expanded. There is persistent small left pleural effusion. The retrocardiac region remains dense on the left. The interstitial markings of both lungs remain increased. The cardiac silhouette remains enlarged and there is persistent central pulmonary vascular prominence. The right-sided PICC line and the esophagogastric tube are unchanged in appearance.  IMPRESSION: Persistent CHF and left lower lobe atelectasis and probable small left pleural effusion. Allowing for differences in radiographic technique there has not been significant interval change.   Electronically Signed   By: David  Martinique M.D.   On: 08/18/2015 07:44   Dg Abd 2 Views  08/17/2015   CLINICAL DATA:  Status post coronary bypass grafting, check nasogastric catheter placement  EXAM: ABDOMEN - 2 VIEW  COMPARISON:  08/16/2015  FINDINGS: A nasogastric catheter is again noted extending into the stomach. The proximal side port lies approximately at the level of the gastroesophageal junction but the overall appearance is stable. There is some slight decrease in the degree of gaseous distension of the small bowel. No free air is seen. A bone island is again noted in the left iliac wing. Degenerative changes of the lumbar spine are noted.  IMPRESSION: Nasogastric catheter within the stomach as described.   Electronically Signed   By: Inez Catalina M.D.   On: 08/17/2015 09:38   Dg Abd Portable 1v  08/18/2015   CLINICAL DATA:  Ileus.  EXAM: PORTABLE ABDOMEN - 1 VIEW  COMPARISON:  08/17/2015.  FINDINGS: NG tube noted with its tip projected over the upper stomach. Soft tissue structures are unremarkable.  Persistent mild small bowel distention. No free air. Degenerative changes lumbar spine and both hips. Scoliosis lumbar spine concave right. Calcific density noted over the left iliac crest. Bibasilar atelectasis and left pleural effusion.  IMPRESSION: NG tube noted with its tip projected over the upper stomach. Persistent mild small bowel distention.   Electronically Signed   By: Marcello Moores  Register   On: 08/18/2015 07:45     Assessment/Plan: S/P Procedure(s) (LRB): CORONARY ARTERY BYPASS GRAFTING (CABG) X4 UTILIZING THE LEFT INTERNAL MAMMARY ARTERY TO LAD AND ENDOSCOPICALLY HARVESTED BILATERAL SAPHENEOUS VEINS TO DIAGONAL,OM1 AND OM2 (N/A) TRANSESOPHAGEAL ECHOCARDIOGRAM (TEE) (N/A)  Ileus- X-ray about the same but clinically improving.  NG output remains high, around 700 ml out overnight.  Continue NPO, NG decompression for now.  CV- Maintaining SR, BPs generally stable. Continue low dose beta blocker.  Pulm- IS, pulm toilet.  Renal- Cr back down today. Continue to monitor.  Hypokalemia- replace K+.  Nutrition- TPN per pharmacy.  Mobilize as tolerated.   COLLINS,GINA H 08/18/2015 8:10 AM   Patient examined, morning x-rays reviewed The ileus is improved. NG drainage is diminished. Abdominal exam is soft and he denies nausea. We'll clamp NG tube and then remove if he remains asymptomatic. Start ice chips later today. Start clear liquids tomorrow.  Central line placed for IV access for his intravenous medications he needs while he is nothing by mouth. Continue TNA until he is established back on a regular diet. We will start ambulating and use physical therapy again. He will be deconditioned.  Chest x-ray shows persistent small left pleural effusion and will continue daily Lasix.  patient examined and medical record reviewed,agree with above note. Tharon Aquas Trigt III 08/18/2015

## 2015-08-19 LAB — GLUCOSE, CAPILLARY
GLUCOSE-CAPILLARY: 119 mg/dL — AB (ref 65–99)
GLUCOSE-CAPILLARY: 123 mg/dL — AB (ref 65–99)
GLUCOSE-CAPILLARY: 128 mg/dL — AB (ref 65–99)
GLUCOSE-CAPILLARY: 143 mg/dL — AB (ref 65–99)
Glucose-Capillary: 125 mg/dL — ABNORMAL HIGH (ref 65–99)
Glucose-Capillary: 129 mg/dL — ABNORMAL HIGH (ref 65–99)

## 2015-08-19 LAB — POCT I-STAT, CHEM 8
BUN: 30 mg/dL — ABNORMAL HIGH (ref 6–20)
CHLORIDE: 102 mmol/L (ref 101–111)
CREATININE: 1 mg/dL (ref 0.61–1.24)
Calcium, Ion: 1.14 mmol/L (ref 1.13–1.30)
GLUCOSE: 128 mg/dL — AB (ref 65–99)
HEMATOCRIT: 30 % — AB (ref 39.0–52.0)
HEMOGLOBIN: 10.2 g/dL — AB (ref 13.0–17.0)
POTASSIUM: 3.7 mmol/L (ref 3.5–5.1)
Sodium: 144 mmol/L (ref 135–145)
TCO2: 29 mmol/L (ref 0–100)

## 2015-08-19 LAB — CBC
HCT: 31.3 % — ABNORMAL LOW (ref 39.0–52.0)
Hemoglobin: 10.2 g/dL — ABNORMAL LOW (ref 13.0–17.0)
MCH: 28.5 pg (ref 26.0–34.0)
MCHC: 32.6 g/dL (ref 30.0–36.0)
MCV: 87.4 fL (ref 78.0–100.0)
Platelets: 270 10*3/uL (ref 150–400)
RBC: 3.58 MIL/uL — ABNORMAL LOW (ref 4.22–5.81)
RDW: 13.7 % (ref 11.5–15.5)
WBC: 10.5 10*3/uL (ref 4.0–10.5)

## 2015-08-19 LAB — BASIC METABOLIC PANEL
Anion gap: 7 (ref 5–15)
BUN: 28 mg/dL — ABNORMAL HIGH (ref 6–20)
CO2: 32 mmol/L (ref 22–32)
Calcium: 8.1 mg/dL — ABNORMAL LOW (ref 8.9–10.3)
Chloride: 104 mmol/L (ref 101–111)
Creatinine, Ser: 1.14 mg/dL (ref 0.61–1.24)
GFR calc Af Amer: >60 mL/min (ref >60)
GFR calc non Af Amer: >60 mL/min (ref >60)
Glucose, Bld: 144 mg/dL — ABNORMAL HIGH (ref 65–99)
Potassium: 3.2 mmol/L — ABNORMAL LOW (ref 3.5–5.1)
Sodium: 143 mmol/L (ref 135–145)

## 2015-08-19 LAB — PHOSPHORUS: Phosphorus: 3.4 mg/dL (ref 2.5–4.6)

## 2015-08-19 LAB — MAGNESIUM: Magnesium: 2.2 mg/dL (ref 1.7–2.4)

## 2015-08-19 MED ORDER — POTASSIUM CHLORIDE 10 MEQ/50ML IV SOLN
10.0000 meq | INTRAVENOUS | Status: AC
  Administered 2015-08-19 (×2): 10 meq via INTRAVENOUS

## 2015-08-19 MED ORDER — POTASSIUM CHLORIDE 10 MEQ/50ML IV SOLN
10.0000 meq | INTRAVENOUS | Status: AC
  Filled 2015-08-19 (×3): qty 50

## 2015-08-19 MED ORDER — POTASSIUM CHLORIDE 10 MEQ/50ML IV SOLN
10.0000 meq | INTRAVENOUS | Status: AC
  Administered 2015-08-19 (×4): 10 meq via INTRAVENOUS
  Filled 2015-08-19 (×4): qty 50

## 2015-08-19 MED ORDER — TRACE MINERALS CR-CU-MN-SE-ZN 10-1000-500-60 MCG/ML IV SOLN
INTRAVENOUS | Status: DC
  Administered 2015-08-19: 18:00:00 via INTRAVENOUS
  Filled 2015-08-19: qty 1920

## 2015-08-19 MED ORDER — POTASSIUM CHLORIDE 10 MEQ/50ML IV SOLN
10.0000 meq | INTRAVENOUS | Status: AC
  Administered 2015-08-19 (×3): 10 meq via INTRAVENOUS
  Filled 2015-08-19 (×2): qty 50

## 2015-08-19 NOTE — Progress Notes (Signed)
8 Days Post-Op Procedure(s) (LRB): CORONARY ARTERY BYPASS GRAFTING (CABG) X4 UTILIZING THE LEFT INTERNAL MAMMARY ARTERY TO LAD AND ENDOSCOPICALLY HARVESTED BILATERAL SAPHENEOUS VEINS TO DIAGONAL,OM1 AND OM2 (N/A) TRANSESOPHAGEAL ECHOCARDIOGRAM (TEE) (N/A) Subjective:  No complaints. Passing flatus but no BM yet.  Has not walked today yet. No abdominal pain  Objective: Vital signs in last 24 hours: Temp:  [97.4 F (36.3 C)-98.2 F (36.8 C)] 98.1 F (36.7 C) (09/10 0700) Pulse Rate:  [69-86] 73 (09/10 0700) Cardiac Rhythm:  [-] Normal sinus rhythm (09/10 0400) Resp:  [13-22] 20 (09/10 0700) BP: (128-156)/(60-77) 139/77 mmHg (09/10 0700) SpO2:  [92 %-96 %] 94 % (09/10 0700) Weight:  [103.783 kg (228 lb 12.8 oz)] 103.783 kg (228 lb 12.8 oz) (09/10 0500)  Hemodynamic parameters for last 24 hours:    Intake/Output from previous day: 09/09 0701 - 09/10 0700 In: 2596.3 [P.O.:250; I.V.:611.3; NG/GT:90; IV Piggyback:550; TPN:1095] Out: 2625 [Urine:2525; Emesis/NG output:100] Intake/Output this shift: Total I/O In: -  Out: 250 [Urine:250]  General appearance: alert and cooperative Heart: regular rate and rhythm, S1, S2 normal, no murmur, click, rub or gallop Lungs: clear to auscultation bilaterally Abdomen: soft, non-tender; bowel sounds normal; no masses,  no organomegaly Wound: incisions ok  Lab Results:  Recent Labs  08/18/15 0334 08/19/15 0403  WBC 7.6 10.5  HGB 9.9* 10.2*  HCT 30.2* 31.3*  PLT 224 270   BMET:  Recent Labs  08/18/15 0334 08/19/15 0403  NA 146* 143  K 3.4* 3.2*  CL 106 104  CO2 35* 32  GLUCOSE 149* 144*  BUN 35* 28*  CREATININE 1.14 1.14  CALCIUM 8.3* 8.1*    PT/INR: No results for input(s): LABPROT, INR in the last 72 hours. ABG    Component Value Date/Time   PHART 7.386 08/12/2015 0500   HCO3 24.9* 08/12/2015 0500   TCO2 30 08/17/2015 1544   ACIDBASEDEF 2.0 08/11/2015 1938   O2SAT 94.8 08/12/2015 0500   CBG (last 3)   Recent  Labs  08/18/15 2352 08/19/15 0417 08/19/15 0836  GLUCAP 123* 143* 125*    Assessment/Plan: S/P Procedure(s) (LRB): CORONARY ARTERY BYPASS GRAFTING (CABG) X4 UTILIZING THE LEFT INTERNAL MAMMARY ARTERY TO LAD AND ENDOSCOPICALLY HARVESTED BILATERAL SAPHENEOUS VEINS TO DIAGONAL,OM1 AND OM2 (N/A) TRANSESOPHAGEAL ECHOCARDIOGRAM (TEE) (N/A)  He is hemodynamically stable in sinus rhythm  Postoperative ileus appears to be resolving. Will start sips of clear liquids today.   Hypokalemia: replete K+  Continue ambulation and IS.   LOS: 10 days    Gaye Pollack 08/19/2015

## 2015-08-19 NOTE — Progress Notes (Signed)
Discussed d/c planning arrangements with pt/daughter.  Pt would like to go to the [facility] if bed available.  CSW will continue follow for d/c planning.

## 2015-08-19 NOTE — Op Note (Signed)
[name], Tyler Mejia NO.:  [id number]  MEDICAL RECORD NO.:  [id number]  LOCATION:  2S05C                        FACILITY:  [facility]  PHYSICIAN:  Ivin Poot, M.D.  DATE OF BIRTH:  Feb 22, 1936  DATE OF PROCEDURE:  08/18/2015 DATE OF DISCHARGE:                              OPERATIVE REPORT   OPERATION:  Placement of triple-lumen central venous IV catheter for IV access.  SURGEON:  Ivin Poot, M.D.  ANESTHESIA:  Local 1% lidocaine.  PREOPERATIVE DIAGNOSIS:  Status post urgent multivessel coronary artery bypass grafting for severe coronary disease.  POSTOPERATIVE DIAGNOSIS:  Status post urgent multivessel coronary artery bypass grafting for severe coronary disease.  DESCRIPTION OF PROCEDURE:  The patient is in the ICU after multivessel CABG.  He has developed postoperative ileus and has required multiple intravenous medications since he cannot absorb oral medications. Central IV access was needed.  Informed consent was obtained.  A proper time-out was performed at the bedside.  The left chest was prepped and draped as a sterile field.  Local 1% lidocaine was infiltrated beneath the left clavicle.  The left subclavian vein was cannulated and a guidewire was passed using the Seldinger technique.  Over the guidewire, dilator was passed, then the triple-lumen catheter.  The guidewire was removed and the catheter lumens were aspirated and flushed with saline.  They were functioning normally.  The catheter was secured to the skin with silk suture. Sterile dressing was applied.  A portable chest x-ray is pending.  No apparent complications.     Ivin Poot, M.D.     PV/MEDQ  D:  08/18/2015  T:  08/19/2015  Job:  [id number]

## 2015-08-19 NOTE — Progress Notes (Addendum)
[place] for TPN Indication: Probably post-op ileus  Allergies  Allergen Reactions  . Codeine Other (See Comments)    Does not know     Patient Measurements: Height: 5\' 9"  (175.3 cm) Weight: 228 lb 12.8 oz (103.783 kg) IBW/kg (Calculated) : 70.7  Adj BW: 80 kg Usual Weight: 109 kg  Vital Signs: Temp: 97.9 F (36.6 C) (09/10 0400) Temp Source: Oral (09/10 0400) BP: 142/70 mmHg (09/10 0600) Pulse Rate: 74 (09/10 0600) Intake/Output from previous day: 09/09 0701 - 09/10 0700 In: 2596.3 [P.O.:250; I.V.:611.3; NG/GT:90; IV Piggyback:550; WUJ:8119] Out: 2625 [Urine:2525; Emesis/NG output:100] Intake/Output from this shift: Total I/O In: 1210.4 [I.V.:300.4; IV Piggyback:250; TPN:660] Out: 850 [Urine:850]  Labs:  Recent Labs  08/17/15 0227 08/17/15 1544 08/18/15 0334 08/19/15 0403  WBC 6.2  --  7.6 10.5  HGB 10.2* 10.5* 9.9* 10.2*  HCT 31.3* 31.0* 30.2* 31.3*  PLT 223  --  224 270     Recent Labs  08/16/15 0929  08/17/15 0227 08/17/15 1544 08/18/15 0334 08/19/15 0403  NA  --   < > 140 143 146* 143  K  --   < > 3.7 3.5 3.4* 3.2*  CL  --   < > 97* 100* 106 104  CO2  --   --  31  --  35* 32  GLUCOSE  --   < > 115* 121* 149* 144*  BUN  --   < > 41* 38* 35* 28*  CREATININE  --   < > 1.36* 1.40* 1.14 1.14  CALCIUM  --   --  8.5*  --  8.3* 8.1*  MG  --   --  2.1  --  2.4 2.2  PHOS  --   --  3.8  --  2.5 3.4  PROT 6.2*  --  5.6*  --  5.8*  --   ALBUMIN 2.7*  --  2.4*  --  2.2*  --   AST 41  --  32  --  24  --   ALT 24  --  23  --  17  --   ALKPHOS 54  --  48  --  43  --   BILITOT 1.4*  --  1.4*  --  0.9  --   BILIDIR 0.3  --   --   --   --   --   IBILI 1.1*  --   --   --   --   --   PREALBUMIN  --   --  10.1*  --   --   --   < > = values in this interval not displayed. Estimated Creatinine Clearance: 65.4 mL/min (by C-G formula based on Cr of 1.14).    Recent Labs  08/18/15 2004 08/18/15 2352 08/19/15 0417   GLUCAP 132* 123* 143*   Insulin Requirements in the past 24 hours:  9 units SSI  Nutritional Goals: per RD 9/8 2300-2500 kcal and 125 - 135 gm protein   Goal Clinimix E 5/15 is @ 173ml/hr (Provides 132g of protein and 2354 kcal)  Current Nutrition:  NPO Clinimix E 5/15 at 60 ml/hr  Assessment: 79 yo M presents on 8/29 with chest pain and CAD. PMH includes GERD, HTN, NSTEMI, HLD, COPD, CAD, IBS. Found to have unstable angina with severe multivessel CAD. Now s/p CABG x 4 on 9/2 with severe abdominal pain and projectile vomiting. Pt does not have any bowel sounds on exam and imaging  shows probably ileus and possibility of SBO. Pharmacy consulted to start TPN on 9/8 for probable ileus. PICC line placed 9/8 for TPN. 9/9: POD #7,  Passing flatus, no more N/V, NGO down to 700 from 1400 mls. 9/9 KUB: persistent mild small bowel distention, but clinically improving. To continue NPO and NGT for today.    Surgeries/Procedures: 9/2 CABG x 4  GI: ileus is clinically improving, 9/9 image of the abdomen shows persistent mild distention of bowel. NPO/NGT continued to drain about 847ml yesteday. CVTS removed NG tube on 9/9 and patient is taking some ice chips with no issues. Patient also has no more N/V. Plan to start clear liquids today  Endo: Was on Levemir and SSI earlier this admit but now only on sensitive SSI. Got 9 units SSI yesterday but all CBGs < 150.   Lytes: Lytes wnl exc K 3.2 (goal > 4) - replaced 67mEq x 3. Will need more K. Phos and Mg ok (goal > 2). CoCa 9.4 . Ca x Phos < 55  Renal: SCr trending down to 1.14  (peaked at 1.94), UOP 0.7 ml/kg/hr. MIVF: NS @ 45ml/hr, on oxybutynin  Pulm: 1L of [place]  Cards: amio gtt, aspirin, furosemide, lopressor  Hepatobil: Albumin low at 2.2, Prealbumin low at 10.1. LFTs wnl. Tbili improved to 0.9 from 1.4. Triglycerides 128 on 9/2  Neuro: Pain score 0. GCS 15. Fentanyl prn  ID: Afebrile, WBC wnl.  9/7 Imipenem >>    Best Practices: PPI,  Lovenox Home  TPN Access: PICC / CVC  TPN start date: 9/8 >>  Plan:  Increase Clinimix E 5/15 to 80 ml/hr Hold IVFE for first 7 days for ICU patients per ASPEN guidelines (Day 3) TPN will provide 96 g of protein and 1363 kcal meeting 74% of protein and 57% of kcal needs Continue MVI and trace elements in TPN Continue sensitive SSI  Potassium replacement per CVTS (TPN pharmacist will not replace) F/U [facility] tomorrow  Elenor Quinones, PharmD Clinical Pharmacist Pager [phone] 08/19/2015 7:00 AM

## 2015-08-19 NOTE — Progress Notes (Signed)
Bed offers given to pt. 

## 2015-08-20 LAB — CBC
HCT: 30.6 % — ABNORMAL LOW (ref 39.0–52.0)
Hemoglobin: 9.6 g/dL — ABNORMAL LOW (ref 13.0–17.0)
MCH: 27.3 pg (ref 26.0–34.0)
MCHC: 31.4 g/dL (ref 30.0–36.0)
MCV: 86.9 fL (ref 78.0–100.0)
Platelets: 289 10*3/uL (ref 150–400)
RBC: 3.52 MIL/uL — ABNORMAL LOW (ref 4.22–5.81)
RDW: 13.7 % (ref 11.5–15.5)
WBC: 10.6 10*3/uL — ABNORMAL HIGH (ref 4.0–10.5)

## 2015-08-20 LAB — GLUCOSE, CAPILLARY
GLUCOSE-CAPILLARY: 107 mg/dL — AB (ref 65–99)
GLUCOSE-CAPILLARY: 123 mg/dL — AB (ref 65–99)
GLUCOSE-CAPILLARY: 138 mg/dL — AB (ref 65–99)
GLUCOSE-CAPILLARY: 139 mg/dL — AB (ref 65–99)
Glucose-Capillary: 138 mg/dL — ABNORMAL HIGH (ref 65–99)
Glucose-Capillary: 93 mg/dL (ref 65–99)

## 2015-08-20 LAB — BASIC METABOLIC PANEL
Anion gap: 5 (ref 5–15)
BUN: 26 mg/dL — ABNORMAL HIGH (ref 6–20)
CO2: 29 mmol/L (ref 22–32)
Calcium: 8 mg/dL — ABNORMAL LOW (ref 8.9–10.3)
Chloride: 107 mmol/L (ref 101–111)
Creatinine, Ser: 1.06 mg/dL (ref 0.61–1.24)
GFR calc Af Amer: >60 mL/min (ref >60)
GFR calc non Af Amer: >60 mL/min (ref >60)
Glucose, Bld: 133 mg/dL — ABNORMAL HIGH (ref 65–99)
Potassium: 3.9 mmol/L (ref 3.5–5.1)
Sodium: 141 mmol/L (ref 135–145)

## 2015-08-20 MED ORDER — POTASSIUM CHLORIDE CRYS ER 20 MEQ PO TBCR
20.0000 meq | EXTENDED_RELEASE_TABLET | Freq: Two times a day (BID) | ORAL | Status: DC
  Administered 2015-08-20 – 2015-08-24 (×9): 20 meq via ORAL
  Filled 2015-08-20 (×13): qty 1

## 2015-08-20 MED ORDER — AMIODARONE HCL 200 MG PO TABS
200.0000 mg | ORAL_TABLET | Freq: Two times a day (BID) | ORAL | Status: DC
  Administered 2015-08-20 – 2015-08-22 (×6): 200 mg via ORAL
  Filled 2015-08-20 (×10): qty 1

## 2015-08-20 MED ORDER — TRACE MINERALS CR-CU-MN-SE-ZN 10-1000-500-60 MCG/ML IV SOLN
INTRAVENOUS | Status: DC
  Filled 2015-08-20: qty 2640

## 2015-08-20 NOTE — Progress Notes (Signed)
9 Days Post-Op Procedure(s) (LRB): CORONARY ARTERY BYPASS GRAFTING (CABG) X4 UTILIZING THE LEFT INTERNAL MAMMARY ARTERY TO LAD AND ENDOSCOPICALLY HARVESTED BILATERAL SAPHENEOUS VEINS TO DIAGONAL,OM1 AND OM2 (N/A) TRANSESOPHAGEAL ECHOCARDIOGRAM (TEE) (N/A) Subjective:  Some hallucinations.  Passing flatus, no nausea, no BM yet  Objective: Vital signs in last 24 hours: Temp:  [97.5 F (36.4 C)-98.5 F (36.9 C)] 97.7 F (36.5 C) (09/11 0749) Pulse Rate:  [70-87] 78 (09/11 1100) Cardiac Rhythm:  [-] Normal sinus rhythm (09/11 0800) Resp:  [12-27] 12 (09/11 1100) BP: (127-167)/(55-112) 147/81 mmHg (09/11 1100) SpO2:  [92 %-97 %] 93 % (09/11 1100) Weight:  [105.9 kg (233 lb 7.5 oz)] 105.9 kg (233 lb 7.5 oz) (09/11 0600)  Hemodynamic parameters for last 24 hours:    Intake/Output from previous day: 09/10 0701 - 09/11 0700 In: 3328.5 [P.O.:540; I.V.:580.8; IV Piggyback:500; TPN:1707.7] Out: 1375 [Urine:1375] Intake/Output this shift: Total I/O In: 576.8 [P.O.:180; I.V.:76.8; TPN:320] Out: 225 [Urine:225]  General appearance: alert and cooperative Heart: regular rate and rhythm, S1, S2 normal, no murmur, click, rub or gallop Lungs: clear to auscultation bilaterally Abdomen: soft, non-tender; bowel sounds normal; no masses,  no organomegaly Extremities: extremities normal, atraumatic, no cyanosis or edema Wound: incisions ok  Lab Results:  Recent Labs  08/19/15 0403 08/19/15 1632 08/20/15 0428  WBC 10.5  --  10.6*  HGB 10.2* 10.2* 9.6*  HCT 31.3* 30.0* 30.6*  PLT 270  --  289   BMET:  Recent Labs  08/19/15 0403 08/19/15 1632 08/20/15 0428  NA 143 144 141  K 3.2* 3.7 3.9  CL 104 102 107  CO2 32  --  29  GLUCOSE 144* 128* 133*  BUN 28* 30* 26*  CREATININE 1.14 1.00 1.06  CALCIUM 8.1*  --  8.0*    PT/INR: No results for input(s): LABPROT, INR in the last 72 hours. ABG    Component Value Date/Time   PHART 7.386 08/12/2015 0500   HCO3 24.9* 08/12/2015 0500   TCO2 29 08/19/2015 1632   ACIDBASEDEF 2.0 08/11/2015 1938   O2SAT 94.8 08/12/2015 0500   CBG (last 3)   Recent Labs  08/20/15 0018 08/20/15 0408 08/20/15 0747  GLUCAP 138* 123* 139*    Assessment/Plan: S/P Procedure(s) (LRB): CORONARY ARTERY BYPASS GRAFTING (CABG) X4 UTILIZING THE LEFT INTERNAL MAMMARY ARTERY TO LAD AND ENDOSCOPICALLY HARVESTED BILATERAL SAPHENEOUS VEINS TO DIAGONAL,OM1 AND OM2 (N/A) TRANSESOPHAGEAL ECHOCARDIOGRAM (TEE) (N/A)  He remains hemodynamically stable  Abdomen is benign with good bowel sounds, passing flatus and tolerated clear liquids yesterday. Advance to soft diet.  Stop TNA.  Continue mobilization, IS.   LOS: 11 days    Tyler Mejia 08/20/2015

## 2015-08-20 NOTE — Progress Notes (Addendum)
[place] for TPN Indication: Probably post-op ileus  Allergies  Allergen Reactions  . Codeine Other (See Comments)    Does not know     Patient Measurements: Height: 5\' 9"  (175.3 cm) Weight: 233 lb 7.5 oz (105.9 kg) IBW/kg (Calculated) : 70.7  Adj BW: 80 kg Usual Weight: 109 kg  Vital Signs: Temp: 98.2 F (36.8 C) (09/11 0400) Temp Source: Oral (09/11 0400) BP: 140/66 mmHg (09/11 0600) Pulse Rate: 73 (09/11 0600) Intake/Output from previous day: 09/10 0701 - 09/11 0700 In: 3221.8 [P.O.:540; I.V.:554.1; IV Piggyback:500; TPN:1627.7] Out: 1375 [Urine:1375] Intake/Output from this shift: Total I/O In: 1613.7 [P.O.:120; I.V.:313.7; IV Piggyback:300; TPN:880] Out: 750 [Urine:750]  Labs:  Recent Labs  08/18/15 0334 08/19/15 0403 08/19/15 1632 08/20/15 0428  WBC 7.6 10.5  --  10.6*  HGB 9.9* 10.2* 10.2* 9.6*  HCT 30.2* 31.3* 30.0* 30.6*  PLT 224 270  --  289     Recent Labs  08/18/15 0334 08/19/15 0403 08/19/15 1632 08/20/15 0428  NA 146* 143 144 141  K 3.4* 3.2* 3.7 3.9  CL 106 104 102 107  CO2 35* 32  --  29  GLUCOSE 149* 144* 128* 133*  BUN 35* 28* 30* 26*  CREATININE 1.14 1.14 1.00 1.06  CALCIUM 8.3* 8.1*  --  8.0*  MG 2.4 2.2  --   --   PHOS 2.5 3.4  --   --   PROT 5.8*  --   --   --   ALBUMIN 2.2*  --   --   --   AST 24  --   --   --   ALT 17  --   --   --   ALKPHOS 43  --   --   --   BILITOT 0.9  --   --   --    Estimated Creatinine Clearance: 71.1 mL/min (by C-G formula based on Cr of 1.06).    Recent Labs  08/19/15 2020 08/20/15 0018 08/20/15 0408  GLUCAP 119* 138* 123*   Insulin Requirements in the past 24 hours:  4 units SSI  Nutritional Goals: per RD 9/8 2300-2500 kcal and 125 - 135 gm protein   Goal Clinimix E 5/15 is @ 150ml/hr (Provides 132g of protein and 2354 kcal)  Current Nutrition:  Clear liquids Clinimix E 5/15 at 80 ml/hr  Assessment: 79 yo M presents on 8/29 with  chest pain and CAD. PMH includes GERD, HTN, NSTEMI, HLD, COPD, CAD, IBS. Found to have unstable angina with severe multivessel CAD. Now s/p CABG x 4 on 9/2 with severe abdominal pain and projectile vomiting. Pt does not have any bowel sounds on exam and imaging shows probably ileus and possibility of SBO. Pharmacy consulted to start TPN on 9/8 for probable ileus. PICC line placed 9/8 for TPN.    Surgeries/Procedures: 9/2 CABG x 4  GI: Now POD #9, ileus is clinically improving, 9/9 image of the abdomen shows persistent mild distention of bowel. NPO/NGT continued to drain about 814ml yesteday. CVTS removed NG tube on 9/9. Patient also has no more N/V. Started clear liquids yesterday, appetite charted as good but charted 0% intake.  Endo: Was on Levemir and SSI earlier this admit but now only on sensitive SSI. Got 4 units SSI yesterday but all CBGs < 150.   Lytes: Lytes wnl exc K 3.9 (goal > 4) - Discussed with Dr. Cyndia Bent, CVTS will order K replacement each day if needed. Phos  and Mg ok (goal > 2) on 9/9. CoCa 9.3 . Ca x Phos < 55  Renal: SCr trending down to 1.06  (peaked at 1.94), UOP 0.5 ml/kg/hr. MIVF: NS @ 40ml/hr, on oxybutynin  Pulm: 1L of [place]  Cards: VSS. amio gtt, aspirin, furosemide, lopressor  Hepatobil: Albumin low at 2.2, Prealbumin low at 10.1. LFTs wnl. Tbili improved to 0.9 from 1.4. Triglycerides 128 on 9/2  Neuro: Pain score 6. GCS 15. Fentanyl prn  ID: Day# 5 of imipenem. Afebrile, WBC wnl.  9/7 Imipenem >>    Best Practices: PPI, Lovenox [place]  TPN Access: PICC / CVC  TPN start date: 9/8 >>  Plan:  Change and increase Clinimix to E 5/20 at 110 ml/hr Hold IVFE for first 7 days for ICU patients per ASPEN guidelines (Day 4) TPN will provide 132 g of protein and 2323 kcal meeting 100% of protein and kcal needs Continue MVI and trace elements in TPN Continue sensitive SSI  F/U TPN labs tomorrow, advancement of diet, may be able to wean and stop TPN soon  Elenor Quinones, PharmD Clinical Pharmacist Pager [phone] 08/20/2015 6:57 AM   ADDENDUM:  Patient advanced to soft diet today. Stopped TPN

## 2015-08-21 LAB — CBC
HCT: 30.9 % — ABNORMAL LOW (ref 39.0–52.0)
Hemoglobin: 9.8 g/dL — ABNORMAL LOW (ref 13.0–17.0)
MCH: 27.5 pg (ref 26.0–34.0)
MCHC: 31.7 g/dL (ref 30.0–36.0)
MCV: 86.8 fL (ref 78.0–100.0)
Platelets: 274 10*3/uL (ref 150–400)
RBC: 3.56 MIL/uL — ABNORMAL LOW (ref 4.22–5.81)
RDW: 13.8 % (ref 11.5–15.5)
WBC: 13.3 10*3/uL — ABNORMAL HIGH (ref 4.0–10.5)

## 2015-08-21 LAB — GLUCOSE, CAPILLARY
GLUCOSE-CAPILLARY: 81 mg/dL (ref 65–99)
GLUCOSE-CAPILLARY: 93 mg/dL (ref 65–99)
GLUCOSE-CAPILLARY: 96 mg/dL (ref 65–99)
Glucose-Capillary: 77 mg/dL (ref 65–99)
Glucose-Capillary: 90 mg/dL (ref 65–99)
Glucose-Capillary: 92 mg/dL (ref 65–99)

## 2015-08-21 LAB — DIFFERENTIAL
Basophils Absolute: 0.1 10*3/uL (ref 0.0–0.1)
Basophils Relative: 0 % (ref 0–1)
Eosinophils Absolute: 0.7 10*3/uL (ref 0.0–0.7)
Eosinophils Relative: 5 % (ref 0–5)
Lymphocytes Relative: 13 % (ref 12–46)
Lymphs Abs: 1.7 10*3/uL (ref 0.7–4.0)
Monocytes Absolute: 1 10*3/uL (ref 0.1–1.0)
Monocytes Relative: 8 % (ref 3–12)
Neutro Abs: 9.8 10*3/uL — ABNORMAL HIGH (ref 1.7–7.7)
Neutrophils Relative %: 74 % (ref 43–77)

## 2015-08-21 NOTE — Progress Notes (Addendum)
[facility] DAILY ICU PROGRESS NOTE                   [address]            [place] [address]          [phone]   10 Days Post-Op Procedure(s) (LRB): CORONARY ARTERY BYPASS GRAFTING (CABG) X4 UTILIZING THE LEFT INTERNAL MAMMARY ARTERY TO LAD AND ENDOSCOPICALLY HARVESTED BILATERAL SAPHENEOUS VEINS TO DIAGONAL,OM1 AND OM2 (N/A) TRANSESOPHAGEAL ECHOCARDIOGRAM (TEE) (N/A)  Total Length of Stay:  LOS: 12 days   Subjective: Feels better. Bowels working. No appetite, but tolerating soft diet without nausea or vomiting.   Objective: Vital signs in last 24 hours: Temp:  [97.8 F (36.6 C)-98.4 F (36.9 C)] 98.1 F (36.7 C) (09/12 0700) Pulse Rate:  [65-83] 80 (09/12 0700) Cardiac Rhythm:  [-] Normal sinus rhythm (09/12 0400) Resp:  [11-21] 20 (09/12 0700) BP: (97-147)/(53-91) 119/75 mmHg (09/12 0700) SpO2:  [90 %-97 %] 93 % (09/12 0700) Weight:  [233 lb 0.4 oz (105.7 kg)] 233 lb 0.4 oz (105.7 kg) (09/12 0500)  Filed Weights   08/19/15 0500 08/20/15 0600 08/21/15 0500  Weight: 228 lb 12.8 oz (103.783 kg) 233 lb 7.5 oz (105.9 kg) 233 lb 0.4 oz (105.7 kg)    Weight change: -7.1 oz (-0.2 kg)   Hemodynamic parameters for last 24 hours:    Intake/Output from previous day: 09/11 0701 - 09/12 0700 In: 1270.2 [P.O.:420; I.V.:110.2; IV Piggyback:300; TPN:440] Out: 2425 [Urine:2425]  Intake/Output this shift: Total I/O In: -  Out: 100 [Urine:100]  Current Meds: Scheduled Meds: . amiodarone  200 mg Oral BID  . antiseptic oral rinse  7 mL Mouth Rinse q12n4p  . antiseptic oral rinse  7 mL Mouth Rinse QID  . aspirin EC  325 mg Oral Daily   Or  . aspirin  324 mg Per Tube Daily  . bisacodyl  10 mg Oral Daily   Or  . bisacodyl  10 mg Rectal Daily  . chlorhexidine  15 mL Mouth/Throat BID  . docusate sodium  200 mg Oral Daily  . enoxaparin (LOVENOX) injection  40 mg Subcutaneous QHS  . furosemide  40 mg Intravenous Daily  . imipenem-cilastatin  500 mg Intravenous 3  times per day  . insulin aspart  0-9 Units Subcutaneous 6 times per day  . metoCLOPramide (REGLAN) injection  10 mg Intravenous 4 times per day  . metoprolol tartrate  12.5 mg Oral BID   Or  . metoprolol tartrate  12.5 mg Per Tube BID  . oxybutynin  10 mg Oral QHS  . pantoprazole (PROTONIX) IV  40 mg Intravenous Q24H  . potassium chloride  20 mEq Oral BID  . sodium chloride  10-40 mL Intracatheter Q12H  . sodium chloride  3 mL Intravenous Q12H   Continuous Infusions: . sodium chloride Stopped (08/20/15 0800)   PRN Meds:.sodium chloride, fentaNYL (SUBLIMAZE) injection, levalbuterol, magnesium hydroxide, metoprolol, ondansetron (ZOFRAN) IV, sodium chloride, sodium chloride  CBGs 107-81-77   Physical Exam: General appearance: alert, cooperative and no distress Heart: regular rate and rhythm Lungs: clear to auscultation bilaterally Abdomen: soft, mildly tender, not distended; + bowel sounds  Extremities: no edema Wound: Clean and dry    Lab Results: CBC: Recent Labs  08/20/15 0428 08/21/15 0509  WBC 10.6* 13.3*  HGB 9.6* 9.8*  HCT 30.6* 30.9*  PLT 289 274   BMET:  Recent Labs  08/19/15 0403 08/19/15 1632 08/20/15 0428  NA 143  144 141  K 3.2* 3.7 3.9  CL 104 102 107  CO2 32  --  29  GLUCOSE 144* 128* 133*  BUN 28* 30* 26*  CREATININE 1.14 1.00 1.06  CALCIUM 8.1*  --  8.0*    PT/INR: No results for input(s): LABPROT, INR in the last 72 hours. Radiology: No results found.   Assessment/Plan: S/P Procedure(s) (LRB): CORONARY ARTERY BYPASS GRAFTING (CABG) X4 UTILIZING THE LEFT INTERNAL MAMMARY ARTERY TO LAD AND ENDOSCOPICALLY HARVESTED BILATERAL SAPHENEOUS VEINS TO DIAGONAL,OM1 AND OM2 (N/A) TRANSESOPHAGEAL ECHOCARDIOGRAM (TEE) (N/A)  Ileus- Clinically resolved. Continue to monitor.  CV- Maintaining SR, BPs generally stable. Continue Amio, Lopressor.  Pulm- IS, pulm toilet..  Nutrition- Continue soft diet. Off TNA.  Mobilize as tolerated.  Hopefully  can tx back to stepdown soon.  Disp- pt and family request [place] at d/c. SW assisting with placement.  COLLINS,GINA H 08/21/2015 8:39 AM   Doing well abd -ileus better tx to stepdown  patient examined and medical record reviewed,agree with above note. Tharon Aquas Trigt III 08/21/2015

## 2015-08-21 NOTE — Progress Notes (Signed)
Patient ID: Tyler Mejia, male   DOB: 01-24-1936, 79 y.o.   MRN: [id number] EVENING ROUNDS NOTE :     [address]       [place] [address]             [phone]                 10 Days Post-Op Procedure(s) (LRB): CORONARY ARTERY BYPASS GRAFTING (CABG) X4 UTILIZING THE LEFT INTERNAL MAMMARY ARTERY TO LAD AND ENDOSCOPICALLY HARVESTED BILATERAL SAPHENEOUS VEINS TO DIAGONAL,OM1 AND OM2 (N/A) TRANSESOPHAGEAL ECHOCARDIOGRAM (TEE) (N/A)  Total Length of Stay:  LOS: 12 days  BP 119/69 mmHg  Pulse 76  Temp(Src) 98.7 F (37.1 C) (Oral)  Resp 18  Ht 5\' 9"  (1.753 m)  Wt 233 lb 0.4 oz (105.7 kg)  BMI 34.40 kg/m2  SpO2 95%  .Intake/Output      09/12 0701 - 09/13 0700   P.O.    I.V. (mL/kg)    IV Piggyback 100   TPN    Total Intake(mL/kg) 100 (0.9)   Urine (mL/kg/hr) 1375 (1.1)   Total Output 1375   Net -1275       Urine Occurrence 1 x     . sodium chloride Stopped (08/20/15 0800)     Lab Results  Component Value Date   WBC 13.3* 08/21/2015   HGB 9.8* 08/21/2015   HCT 30.9* 08/21/2015   PLT 274 08/21/2015   GLUCOSE 133* 08/20/2015   CHOL 138 08/11/2015   TRIG 128 08/11/2015   HDL 27* 08/11/2015   LDLCALC 85 08/11/2015   ALT 17 08/18/2015   AST 24 08/18/2015   NA 141 08/20/2015   K 3.9 08/20/2015   CL 107 08/20/2015   CREATININE 1.06 08/20/2015   BUN 26* 08/20/2015   CO2 29 08/20/2015   INR 1.51* 08/11/2015   HGBA1C 6.0* 08/10/2015   Walking around unit tonight Stable day  Grace Isaac MD  Beeper [phone] Office [phone] 08/21/2015 7:07 PM

## 2015-08-21 NOTE — Progress Notes (Signed)
Physical Therapy Treatment Patient Details Name: Tyler Mejia MRN: [id number] DOB: 1936/03/26 Today's Date: 08/21/2015    History of Present Illness Patient is a 79 yo male s/p CORONARY ARTERY BYPASS GRAFTING (CABG) X4 ; developed ileus with severe vomiting; moved back to ICU with NG tube    PT Comments    Patient reporting feeling short of breath (SaO2 92-95% on RA with activity) and c/o "hurting all over." Abdomen remains distended and pt visibly uncomfortable when seated. Ambulation slightly weaker today (Rt knee instability and needed assist to direct RW--different than on 9/9). Did do well with static standing exercises for balance with no UE support.   Follow Up Recommendations  SNF     Equipment Recommendations  Rolling walker with 5" wheels    Recommendations for Other Services OT consult     Precautions / Restrictions Precautions Precautions: Sternal;Fall Precaution Comments: Patient able to recall precautions, however requires cues to maintain precautions with functional mobility.  Restrictions Weight Bearing Restrictions:  (sternal prec.)    Mobility  Bed Mobility Overal bed mobility: Needs Assistance Bed Mobility: Sit to Sidelying   Sidelying to sit:  (HOB 15)     Sit to sidelying: Mod assist General bed mobility comments: pt holds pillow to chest to prevent use of UEs and splint sternum; vc and assist to raise legs onto bed (pt initiates going to side and then prematurely roles onto his back and unable to pull his legs up  Transfers Overall transfer level: Needs assistance Equipment used: Rolling walker (2 wheeled);None Transfers: Sit to/from [employer] to Stand: Min guard;From elevated surface Stand pivot transfers: Min assist       General transfer comment: x3; VCs for positioning and use of LEs to power up holding sternal pillow for splinting of chest during transition  Ambulation/Gait Ambulation/Gait assistance: Min  guard Ambulation Distance (Feet): 350 Feet Assistive device: Rolling walker (2 wheeled) Gait Pattern/deviations: Step-through pattern;Decreased stride length;Trunk flexed (?Rt knee hyperextension (difficult to view)) Gait velocity: decreased   General Gait Details: seemed to feel pt's Rt knee hyperextending after ~50 ft of ambulation (unable to view from pt's Lt side and attempts to lift gown); slightly more unsteady with vc to avoid rail and then door    Stairs            Wheelchair Mobility    Modified Rankin (Stroke Patients Only)       Balance     Sitting balance-Leahy Scale: Good Sitting balance - Comments: able to maintain EOb without physical assist   Standing balance support: No upper extremity supported Standing balance-Leahy Scale: Fair Standing balance comment: stood static x 1 minute with narrrowed BOS (not quite Romberg); very little sway when eyes closed; able to raise each arm up to shoulder height without LOB; required HHA for stepping forward and backward (pt felt he needed support)                     Cognition Arousal/Alertness: Awake/alert Behavior During Therapy: WFL for tasks assessed/performed Overall Cognitive Status: Within Functional Limits for tasks assessed                      Exercises      General Comments        Pertinent Vitals/Pain Pain Assessment: 0-10 Pain Score: 8  Pain Location: chest and abd Pain Descriptors / Indicators: Discomfort;Sore Pain Intervention(s): Limited activity within patient's tolerance;Monitored during session;Repositioned    Home  Living                      Prior Function            PT Goals (current goals can now be found in the care plan section) Acute Rehab PT Goals Patient Stated Goal: to get stronger Time For Goal Achievement: 08/29/15 Progress towards PT goals: Progressing toward goals    Frequency  Min 3X/week    PT Plan Current plan remains appropriate     Co-evaluation             End of Session Equipment Utilized During Treatment: Gait belt Activity Tolerance: Patient tolerated treatment well Patient left: with call bell/phone within reach;in bed;with SCD's reapplied     Time: 1139-1207 PT Time Calculation (min) (ACUTE ONLY): 28 min  Charges:  $Gait Training: 8-22 mins $Therapeutic Activity: 8-22 mins                    G Codes:      Vollie Aaron 2015-09-05, 12:28 PM Pager [phone]

## 2015-08-22 LAB — CBC
HCT: 30.9 % — ABNORMAL LOW (ref 39.0–52.0)
Hemoglobin: 10 g/dL — ABNORMAL LOW (ref 13.0–17.0)
MCH: 28.1 pg (ref 26.0–34.0)
MCHC: 32.4 g/dL (ref 30.0–36.0)
MCV: 86.8 fL (ref 78.0–100.0)
Platelets: 375 10*3/uL (ref 150–400)
RBC: 3.56 MIL/uL — ABNORMAL LOW (ref 4.22–5.81)
RDW: 13.9 % (ref 11.5–15.5)
WBC: 16 10*3/uL — ABNORMAL HIGH (ref 4.0–10.5)

## 2015-08-22 LAB — GLUCOSE, CAPILLARY
GLUCOSE-CAPILLARY: 107 mg/dL — AB (ref 65–99)
GLUCOSE-CAPILLARY: 90 mg/dL (ref 65–99)
GLUCOSE-CAPILLARY: 95 mg/dL (ref 65–99)
Glucose-Capillary: 82 mg/dL (ref 65–99)
Glucose-Capillary: 77 mg/dL (ref 65–99)
Glucose-Capillary: 103 mg/dL — ABNORMAL HIGH (ref 65–99)

## 2015-08-22 MED ORDER — PANTOPRAZOLE SODIUM 40 MG PO TBEC
40.0000 mg | DELAYED_RELEASE_TABLET | Freq: Every day | ORAL | Status: DC
  Administered 2015-08-22 – 2015-08-23 (×2): 40 mg via ORAL
  Filled 2015-08-22 (×2): qty 1

## 2015-08-22 MED ORDER — FUROSEMIDE 40 MG PO TABS
40.0000 mg | ORAL_TABLET | Freq: Every day | ORAL | Status: DC
  Administered 2015-08-22 – 2015-08-24 (×3): 40 mg via ORAL
  Filled 2015-08-22 (×4): qty 1

## 2015-08-22 NOTE — Progress Notes (Signed)
Patient and daughter Tyler Mejia concerned that Dr. Prescott Gum said he would be ready to go to [facility] on Thursday 08/24/15. Pt and daughter would to make sure this is discussed tomorrow and that [name] is updated. Pt resting with call bell within reach.  Will continue to monitor. Payton Emerald, RN

## 2015-08-22 NOTE — Progress Notes (Signed)
CARDIAC REHAB PHASE I   PRE:  Rate/Rhythm: 77 SR    BP: sitting 118/60    SaO2: 93 RA  MODE:  Ambulation: 350 ft   POST:  Rate/Rhythm: 88 SR    BP: sitting 116/93     SaO2: 93 RA  Pt OOB with min assist and to BR for BM. Ambulated with RW, assist x1. DOE however SaO2 stable. Rest x3. To recliner for breakfast. No major c/o however pt does not seem to complain. Will f/u. [place], [place], ACSM 08/22/2015 8:55 AM

## 2015-08-22 NOTE — Clinical Social Work Note (Signed)
Patient transferred to 2W23, verbal handoff given to unit CSW, this CSW to sign off.  Jones Broom. [place], MSW, [place] 08/22/2015 11:04 AM

## 2015-08-22 NOTE — Progress Notes (Signed)
Patient ambulated in hallway with RW, on room air and with minimal assistance approximately 350'. Patient tolerated well. Pt returned to room to sit in recliner for dinner. Pt resting with call bell within reach.  Will continue to monitor. Payton Emerald, RN

## 2015-08-22 NOTE — Progress Notes (Signed)
I spoke with patient concerning pain medication.  Patient only has fentanyl IV for pain. Patient consistently states pain 7-9 approximately every 3 hours.  Patient says pain is generalized.  Girlfriend present and states that patient does not need all the pain medication he is getting. Patient agrees that his pain is generalized stiffness from being in the bed.  Patient understands that he needs to ambulate more and sit in chair for his meals. Pt says he feel better now that he was able to get some sleep today. Patient has ambulated in hallway with encouragement.  Pt resting with call bell within reach.  Will continue to monitor.

## 2015-08-22 NOTE — Progress Notes (Addendum)
Tyler Mejia       Tyler Mejia, Tyler Mejia             [phone]      11 Days Post-Op Procedure(s) (LRB): CORONARY ARTERY BYPASS GRAFTING (CABG) X4 UTILIZING THE LEFT INTERNAL MAMMARY ARTERY TO LAD AND ENDOSCOPICALLY HARVESTED BILATERAL SAPHENEOUS VEINS TO DIAGONAL,OM1 AND OM2 (N/A) TRANSESOPHAGEAL ECHOCARDIOGRAM (TEE) (N/A)   Subjective:  Tyler Mejia is tired and short of breath this morning.  He had just come back from the bathroom and states it wore him out.  He continues to move his bowels.  He denies nausea and vomiting and is tolerating his diet.  Objective: Vital signs in last 24 hours: Temp:  [97.5 F (36.4 C)-98.7 F (37.1 C)] 98.3 F (36.8 C) (09/13 0541) Pulse Rate:  [70-84] 72 (09/13 0541) Cardiac Rhythm:  [-] Normal sinus rhythm (09/13 0700) Resp:  [18-21] 18 (09/13 0541) BP: (106-135)/(52-70) 110/62 mmHg (09/13 0541) SpO2:  [93 %-96 %] 94 % (09/13 0541) Weight:  [229 lb 8 oz (104.101 kg)] 229 lb 8 oz (104.101 kg) (09/13 0541)  Intake/Output from previous day: 09/12 0701 - 09/13 0700 In: 610 [P.O.:500; I.V.:10; IV Piggyback:100] Out: 2150 [Urine:2150]  General appearance: alert, cooperative and no distress Heart: regular rate and rhythm Lungs: clear to auscultation bilaterally Abdomen: soft, non-tender; bowel sounds normal; no masses,  no organomegaly Extremities: edema R.L Wound: clean and dry, ecchymosis RLE  Lab Results:  Recent Labs  08/21/15 0509 08/22/15 0415  WBC 13.3* 16.0*  HGB 9.8* 10.0*  HCT 30.9* 30.9*  PLT 274 375   BMET:  Recent Labs  08/19/15 1632 08/20/15 0428  NA 144 141  K 3.7 3.9  CL 102 107  CO2  --  29  GLUCOSE 128* 133*  BUN 30* 26*  CREATININE 1.00 1.06  CALCIUM  --  8.0*    PT/INR: No results for input(s): LABPROT, INR in the last 72 hours. ABG    Component Value Date/Time   PHART 7.386 08/12/2015 0500   HCO3 24.9* 08/12/2015 0500   TCO2 29 08/19/2015 1632   ACIDBASEDEF 2.0 08/11/2015 1938     O2SAT 94.8 08/12/2015 0500   CBG (last 3)   Recent Labs  08/22/15 0009 08/22/15 0445 08/22/15 0649  GLUCAP 95 90 82    Assessment/Plan: S/P Procedure(s) (LRB): CORONARY ARTERY BYPASS GRAFTING (CABG) X4 UTILIZING THE LEFT INTERNAL MAMMARY ARTERY TO LAD AND ENDOSCOPICALLY HARVESTED BILATERAL SAPHENEOUS VEINS TO DIAGONAL,OM1 AND OM2 (N/A) TRANSESOPHAGEAL ECHOCARDIOGRAM (TEE) (N/A)  1. Ileus- resolving, continues to move bowels without difficulty, no nausea or vomiting- currently on soft diet, if no issues to day can try regular diet tomorrow 2. CV- hemodynamically stable continue Amiodarone, Lopressor 3. Pulm- no acute issues, continue IS 4. Renal- creatinine has been stable, weight is at baseline, will switch Lasix to PO 5. ID- + leukocytosis, afebrile- on Primaxin for suspected intra-abdominal infection, however could be early cellulitis from RLE- significant ecchymosis, warm to touch 6. Deconditioning- SNF at discharge 7. Dispo- patient is stable, ileus is resolving, new Leukocytosis on Primaxin, continue ABX, will discuss with staff... May benefit to remove some IV sites, repeat CBC in AM  LOS: 13 days    BARRETT, ERIN 08/22/2015  Prolonged ileus after urgent CABG Patient now starting name Sarina Ill but he is very deconditioned White count is elevated-finishing a course of Primaxin for possible intra-abdominal sepsis Check urine culture and chest x-ray-no fever and patient remaining sinus Regular heart healthy  diet to start tomorrow Patient's ultimate plan is for SNF  at [facility]  patient examined and medical record reviewed,agree with above note. Tharon Aquas Trigt III 08/22/2015

## 2015-08-23 ENCOUNTER — Inpatient Hospital Stay (HOSPITAL_COMMUNITY): Payer: Medicare Other

## 2015-08-23 DIAGNOSIS — K567 Ileus, unspecified: Secondary | ICD-10-CM

## 2015-08-23 DIAGNOSIS — K9189 Other postprocedural complications and disorders of digestive system: Secondary | ICD-10-CM

## 2015-08-23 LAB — URINALYSIS, ROUTINE W REFLEX MICROSCOPIC
Bilirubin Urine: NEGATIVE
Glucose, UA: NEGATIVE mg/dL
Hgb urine dipstick: NEGATIVE
Ketones, ur: NEGATIVE mg/dL
Leukocytes, UA: NEGATIVE
Nitrite: NEGATIVE
Protein, ur: NEGATIVE mg/dL
Specific Gravity, Urine: 1.007 (ref 1.005–1.030)
Urobilinogen, UA: 0.2 mg/dL (ref 0.0–1.0)
pH: 6.5 (ref 5.0–8.0)

## 2015-08-23 LAB — GLUCOSE, CAPILLARY
GLUCOSE-CAPILLARY: 96 mg/dL (ref 65–99)
GLUCOSE-CAPILLARY: 130 mg/dL — AB (ref 65–99)
GLUCOSE-CAPILLARY: 116 mg/dL — AB (ref 65–99)
GLUCOSE-CAPILLARY: 108 mg/dL — AB (ref 65–99)
Glucose-Capillary: 105 mg/dL — ABNORMAL HIGH (ref 65–99)
Glucose-Capillary: 96 mg/dL (ref 65–99)

## 2015-08-23 LAB — CBC
HCT: 31.3 % — ABNORMAL LOW (ref 39.0–52.0)
Hemoglobin: 9.9 g/dL — ABNORMAL LOW (ref 13.0–17.0)
MCH: 27.5 pg (ref 26.0–34.0)
MCHC: 31.6 g/dL (ref 30.0–36.0)
MCV: 86.9 fL (ref 78.0–100.0)
Platelets: 285 10*3/uL (ref 150–400)
RBC: 3.6 MIL/uL — ABNORMAL LOW (ref 4.22–5.81)
RDW: 13.7 % (ref 11.5–15.5)
WBC: 13.2 10*3/uL — ABNORMAL HIGH (ref 4.0–10.5)

## 2015-08-23 MED ORDER — AMIODARONE HCL 200 MG PO TABS
200.0000 mg | ORAL_TABLET | Freq: Every day | ORAL | Status: DC
  Administered 2015-08-23 – 2015-08-24 (×2): 200 mg via ORAL
  Filled 2015-08-23: qty 1

## 2015-08-23 MED ORDER — ENSURE ENLIVE PO LIQD
237.0000 mL | Freq: Two times a day (BID) | ORAL | Status: DC
  Administered 2015-08-23 – 2015-08-24 (×2): 237 mL via ORAL

## 2015-08-23 MED ORDER — TRAMADOL HCL 50 MG PO TABS
50.0000 mg | ORAL_TABLET | Freq: Four times a day (QID) | ORAL | Status: DC | PRN
  Administered 2015-08-23 – 2015-08-24 (×2): 50 mg via ORAL
  Filled 2015-08-23 (×2): qty 1

## 2015-08-23 MED ORDER — ACETAMINOPHEN 325 MG PO TABS
650.0000 mg | ORAL_TABLET | Freq: Four times a day (QID) | ORAL | Status: DC | PRN
  Administered 2015-08-23: 650 mg via ORAL
  Filled 2015-08-23: qty 2

## 2015-08-23 NOTE — Progress Notes (Signed)
Nutrition Follow-up  DOCUMENTATION CODES:   Obesity unspecified  INTERVENTION:   Ensure Enlive po BID, each supplement provides 350 kcal and 20 grams of protein  NUTRITION DIAGNOSIS:   Inadequate oral intake related to poor appetite as evidenced by meal completion < 25%. Ongoing  GOAL:   Patient will meet greater than or equal to 90% of their needs Not met.   MONITOR:   PO intake, Supplement acceptance, Labs, I & O's  ASSESSMENT:   78 y.o. Male admitted with severe CAD.  Pt s/p CABG x 4 on 9/2 developed post op ileus with abdominal pain and projectile vomiting and started on TPN 9/8. TPN d/c'ed 9/12.  PO intake has been decreased, consuming < 25% of all meals.  Pt is agreeable to ensure.  Plan for SNF tomorrow.   Diet Order:  Diet Heart Room service appropriate?: Yes; Fluid consistency:: Thin  Skin:  Reviewed, no issues  Last BM:  9/14  Height:   Ht Readings from Last 1 Encounters:  08/09/15 5' 9" (1.753 m)    Weight:   Wt Readings from Last 1 Encounters:  08/23/15 227 lb 1.6 oz (103.012 kg)    BMI:  Body mass index is 33.52 kg/(m^2).  Estimated Nutritional Needs:   Kcal:  2300-2500  Protein:  125-135 gm  Fluid:  per MD  EDUCATION NEEDS:   No education needs identified at this time    RD, LDN, CNSC [phone] Pager [phone] After Hours Pager  

## 2015-08-23 NOTE — Care Management Important Message (Signed)
Important Message  Patient Details  Name: Tyler Mejia MRN: [id number] Date of Birth: 1936-04-29   Medicare Important Message Given:  Yes-third notification given    Nathen May 08/23/2015, 12:04 PM

## 2015-08-23 NOTE — Progress Notes (Signed)
D/c pt's central line. VS stable. BP 118/67. Pt tolerated procedure well. Applied pressure dressing. No bleeding from the site. Will continue to monitor.

## 2015-08-23 NOTE — Progress Notes (Signed)
Patient ambulated 150 ft. With a rolling walker on RA, patient tolerated well, pt. Back in bed resting comfortably. Will continue to monitor.

## 2015-08-23 NOTE — Progress Notes (Addendum)
      [address]       [place] [address]             [phone]      12 Days Post-Op Procedure(s) (LRB): CORONARY ARTERY BYPASS GRAFTING (CABG) X4 UTILIZING THE LEFT INTERNAL MAMMARY ARTERY TO LAD AND ENDOSCOPICALLY HARVESTED BILATERAL SAPHENEOUS VEINS TO DIAGONAL,OM1 AND OM2 (N/A) TRANSESOPHAGEAL ECHOCARDIOGRAM (TEE) (N/A)   Subjective:  Tyler Mejia feels tired and weak this morning.  He states going to get a CXR wore him out.  He is ambulating with assistance.  + BM  Objective: Vital signs in last 24 hours: Temp:  [98.2 F (36.8 C)-98.3 F (36.8 C)] 98.2 F (36.8 C) (09/14 0417) Pulse Rate:  [69-77] 70 (09/14 0417) Cardiac Rhythm:  [-] Normal sinus rhythm (09/14 0700) Resp:  [17-19] 19 (09/14 0417) BP: (117-120)/(58-76) 117/59 mmHg (09/14 0417) SpO2:  [93 %-97 %] 93 % (09/14 0417) Weight:  [227 lb 1.6 oz (103.012 kg)] 227 lb 1.6 oz (103.012 kg) (09/14 0500)  Intake/Output from previous day: 09/13 0701 - 09/14 0700 In: 460 [P.O.:460] Out: 1250 [Urine:1250]  General appearance: alert, cooperative and no distress Heart: regular rate and rhythm Lungs: diminished breath sounds left base Abdomen: soft, non-tender; bowel sounds normal; no masses,  no organomegaly Extremities: edema trace Wound: clean and dry  Lab Results:  Recent Labs  08/22/15 0415 08/23/15 0447  WBC 16.0* 13.2*  HGB 10.0* 9.9*  HCT 30.9* 31.3*  PLT 375 285   BMET: No results for input(s): NA, K, CL, CO2, GLUCOSE, BUN, CREATININE, CALCIUM in the last 72 hours.  PT/INR: No results for input(s): LABPROT, INR in the last 72 hours. ABG    Component Value Date/Time   PHART 7.386 08/12/2015 0500   HCO3 24.9* 08/12/2015 0500   TCO2 29 08/19/2015 1632   ACIDBASEDEF 2.0 08/11/2015 1938   O2SAT 94.8 08/12/2015 0500   CBG (last 3)   Recent Labs  08/22/15 2031 08/23/15 0015 08/23/15 0418  GLUCAP 103* 96 96    Assessment/Plan: S/P Procedure(s) (LRB): CORONARY ARTERY BYPASS  GRAFTING (CABG) X4 UTILIZING THE LEFT INTERNAL MAMMARY ARTERY TO LAD AND ENDOSCOPICALLY HARVESTED BILATERAL SAPHENEOUS VEINS TO DIAGONAL,OM1 AND OM2 (N/A) TRANSESOPHAGEAL ECHOCARDIOGRAM (TEE) (N/A)  1. CV- hemodynamically stable- continue Amiodarone and Lopressor 2. Ileus- resolving, no nausea and vomiting, moving bowels, + BS 3. Pulm- CXR with LLL infiltrate/effusion, no acute issues, continue IS 4. Renal- weight continues to trend down, continue Lasix 5. Pain control- d/c IV Fentanyl, patient not with significant pain, mainly stiffness will start Tylenol and Tramadol prn 6. Deconditioning- SNF at discharge 7. Dispo- patient stable, Ileus resolving, advance diet to heart healthy- if remains stable for SNF tomorrow   LOS: 14 days    BARRETT, ERIN 08/23/2015  cxr reviewed- ok nsr wbc better Incisions clean Reduce amio to 200 daily [facility] tomorrow  patient examined and medical record reviewed,agree with above note. Tyler Mejia 08/23/2015

## 2015-08-23 NOTE — Progress Notes (Signed)
CARDIAC REHAB PHASE I   PRE:  Rate/Rhythm: 75 SR  BP:  Sitting: 103/59        SaO2: 94 RA  MODE:  Ambulation: 150 ft   POST:  Rate/Rhythm: 86 SR  BP:  Sitting: 120/96         SaO2: 95 RA  Pt states he is not feeling well today, states he is just tired and weak. Pt ambulated 150 ft on RA, rolling walker, gait belt, fairly steady gait, tolerated fair. Pt c/o fatigue, weakness, mild DOE, denies CP, dizziness, declined rest stop. Sats 95% on RA. Pt to bed per pt request after walk, call bell within reach.      [phone]  Lenna Sciara, RN, BSN 08/23/2015 1:54 PM

## 2015-08-23 NOTE — Progress Notes (Signed)
Physical Therapy Treatment Patient Details Name: Tyler Mejia MRN: [id number] DOB: [birth year] Today's Date: 08/23/2015    History of Present Illness Patient is a 79 yo male s/p CORONARY ARTERY BYPASS GRAFTING (CABG) X4 ; developed ileus with severe vomiting; moved back to ICU with NG tube    PT Comments    Pt reports that he feels more weak, tired, and SOB today though continues to progress with mobility, ambulating 150' x2 with min-guard A and RW, O2 sats 92% and HR 79 bpm. PT will continue to follow.   Follow Up Recommendations  SNF     Equipment Recommendations  Rolling walker with 5" wheels    Recommendations for Other Services OT consult     Precautions / Restrictions Precautions Precautions: Sternal;Fall Precaution Comments: pt able to verbalize sternal precautions and keep with mobility    Mobility  Bed Mobility Overal bed mobility: Needs Assistance Bed Mobility: Supine to Sit;Sit to Supine Rolling: Supervision Sidelying to sit: Supervision   Sit to supine: Min assist   General bed mobility comments: vc's for rolling first as pt tends to slide legs off bed first and sitting straight up from that position contraindicated with sternal precautions. Once rolled to side, pt able to sit up with supervision. Needed min A for legs back into bed due to soreness  Transfers Overall transfer level: Needs assistance Equipment used: Rolling walker (2 wheeled);None Transfers: Sit to/from Stand Sit to Stand: Min guard;Min assist         General transfer comment: from bed, pt performed sit to stand 2x with min-guard A, from lower chair, pt needed min A for power up since he is not using UE's  Ambulation/Gait Ambulation/Gait assistance: Min assist;Min guard Ambulation Distance (Feet): 300 Feet (150' x2) Assistive device: Rolling walker (2 wheeled) Gait Pattern/deviations: Step-through pattern;Trunk flexed Gait velocity: decreased Gait velocity interpretation: Below  normal speed for age/gender General Gait Details: vc's for posture. Seated rest break taken between 150'. Pt felt fatigued throughout ambualtion. Pt somewhat impulsive with second return to room and turning around to sit, needed min A to control RW   Stairs            Wheelchair Mobility    Modified Rankin (Stroke Patients Only)       Balance Overall balance assessment: Needs assistance Sitting-balance support: No upper extremity supported Sitting balance-Leahy Scale: Good     Standing balance support: Bilateral upper extremity supported Standing balance-Leahy Scale: Fair Standing balance comment: pt can let go of RW and stand statically but needs UE support for dynamic acitvity                    Cognition Arousal/Alertness: Awake/alert Behavior During Therapy: Flat affect;WFL for tasks assessed/performed Overall Cognitive Status: Within Functional Limits for tasks assessed                      Exercises      General Comments General comments (skin integrity, edema, etc.): O2 sats 92%, HR 79 bpm with ambulation. After session, pt first sat up in chair but reported that he felt too weak to remain there and returned to bed      Pertinent Vitals/Pain Pain Assessment: Faces Faces Pain Scale: Hurts little more Pain Location: legs Pain Descriptors / Indicators: Sore Pain Intervention(s): Limited activity within patient's tolerance    Home Living  Prior Function            PT Goals (current goals can now be found in the care plan section) Acute Rehab PT Goals Patient Stated Goal: to get stronger PT Goal Formulation: With patient Time For Goal Achievement: 08/29/15 Potential to Achieve Goals: Good Progress towards PT goals: Progressing toward goals    Frequency  Min 3X/week    PT Plan Current plan remains appropriate    Co-evaluation             End of Session Equipment Utilized During Treatment: Gait  belt Activity Tolerance: Patient tolerated treatment well Patient left: in bed;with call bell/phone within reach     Time: 0959-1024 PT Time Calculation (min) (ACUTE ONLY): 25 min  Charges:  $Gait Training: 23-37 mins                    G Codes:     Leighton Roach, PT  Acute Rehab Services  [phone]  Leighton Roach 08/23/2015, 12:13 PM

## 2015-08-23 NOTE — Progress Notes (Signed)
CSW initiated insurance auth for [facility] with anticipated DC tomorrow (9/15) if pt is stable.  Insurance auth pending- CSW will continue to follow.  Domenica Reamer, [place] Social Worker [phone]

## 2015-08-23 NOTE — Discharge Summary (Signed)
Physician Discharge Summary  Patient ID: Tyler Mejia MRN: [id number] DOB/AGE: 1936/05/31 79 y.o.  Admit date: 08/09/2015 Discharge date: 08/24/2015  Admission Diagnoses:  Patient Active Problem List   Diagnosis Date Noted  . Coronary artery disease involving native coronary artery of native heart without angina pectoris   . S/P CABG x 4 08/11/2015  . Obesity 08/10/2015  . Accelerating angina 08/09/2015  . Coronary artery disease involving native coronary artery of native heart with angina pectoris   . Mucosal abnormality of esophagus   . Hiatal hernia   . Dysphagia 03/15/2015  . Schatzki's ring 03/15/2015  . COPD (chronic obstructive pulmonary disease) 08/21/2012  . Coronary artery disease involving native coronary artery 05/20/2012  . HLD (hyperlipidemia) 04/10/2012  . GERD (gastroesophageal reflux disease) 02/28/2012  . Shortness of breath 02/28/2012  . Chest pain 02/28/2012  . Essential hypertension 02/28/2012   Discharge Diagnoses:   Patient Active Problem List   Diagnosis Date Noted  . Ileus, postoperative 08/23/2015  . S/P CABG (coronary artery bypass graft)   . S/P CABG x 3   . Coronary artery disease involving native coronary artery of native heart without angina pectoris   . S/P CABG x 4 08/11/2015  . Obesity 08/10/2015  . Accelerating angina 08/09/2015  . Coronary artery disease involving native coronary artery of native heart with angina pectoris   . Mucosal abnormality of esophagus   . Hiatal hernia   . Dysphagia 03/15/2015  . Schatzki's ring 03/15/2015  . COPD (chronic obstructive pulmonary disease) 08/21/2012  . Coronary artery disease involving native coronary artery 05/20/2012  . HLD (hyperlipidemia) 04/10/2012  . GERD (gastroesophageal reflux disease) 02/28/2012  . Shortness of breath 02/28/2012  . Chest pain 02/28/2012  . Essential hypertension 02/28/2012   Discharged Condition: good  History of Present:  Tyler Mejia is a 79 yo obese white male  who presented with exertional chest pain which had developed over the past several weeks.  He has a known history of CAD with placement of drug eluting stent to his RCA in 2013.  He was evaluated by Dr. Domenic Polite who recommended the patient undergo a stress test which was low risk for ischemia.  However, due to his history there was concern that there may still be disease progression causing his symptoms.  He underwent cardiac catheterization which showed multivessel CAD with patent stents in the RCA.  It was felt coronary bypass would be indicated.  He was admitted and TCTS consult was obtained.  Hospital Course:   The patient was chest pain free during his admission.  He was evaluated by Dr. Prescott Gum who was in agreement he would benefit from coronary bypass procedure.  The risks and benefits of the procedure were explained to the patient and he was agreeable to proceed.  He was taken to the operating room on 08/11/2015.  He underwent CABG x 4 utilizing LIMA to LAD, SVG to Diagonal, SVG to OM1, and SVG to OM2.  He also underwent endoscopic harvest of the greater saphenous vein from the right thigh and mid calf.  He tolerated the procedure well and was taken to the SICU in stable condition.  He was extubated the evening of surgery.  During his stay in the SICU the patient was weaned off Milrinone and Dopamine.  He developed post operative Atrial Fibrillation and was subsequently treated with IV Amiodarone with successful conversion to NSR.  The patient was felt medically stable for transfer to the step down unit on POD #  4.  The patient developed nausea and vomiting.  He was made NPO and started on IV fluids for likely early Ileus.  This worsened throughout the day and became projectile with abdominal distention.  The patient had an NG tube placed with immediate return of 1400 cc of gastric contents.  He was transferred back to the SICU.  He underwent CT scan which confirmed Ileus vs. Early bowel obstruction.  He  was started on TPN.  The patients symptoms improved over the next several days.  His NG tube was later removed.  His bowels began to move and he was started on a liquid diet.  He was again medically stable and transferred to the step down unit on POD #10.  The patient continues to progress.  He is maintaining NSR.  His diet has been advanced and he is now tolerating a heart healthy diet with no nausea and vomiting.  He is deconditioned and will require SNF placement at discharge.  This has been arranged for [facility].  The patient's bowels continues to move without difficulty.  He is medically stable at this time and will be discharged to SNF today.        Significant Diagnostic Studies:   Cardiac Catheterization  The left ventricular systolic function is normal.  Patent stents in the distal RCA. Mild disease in the mid RCA. No significant RCA disease.  Severe disease in the mid LAD a bifurcation with a large diagonal. Up to 90% stenosis past the origin of the diagonal in the main LAD. 80% ostial diagonal stenosis.  Severe disease in the OM1 and moderate disease in the OM 2.  Treatments:  IV hydration, antibiotics, TPN  Surgery:   1. Coronary artery bypass grafting x4 (left internal mammary artery to  LAD, saphenous vein graft to diagonal, saphenous vein graft to OM1,  and saphenous vein graft to OM2). 2. Bilateral endoscopic harvest of saphenous vein.  Disposition: SNF  Discharge Medications:  The patient has been discharged on:   1.Beta Blocker:  Yes [ x  ]                              No   [   ]                              If No, reason:  2.Ace Inhibitor/ARB: Yes [   ]                                     No  [ x   ]                                     If No, reason: Labile blood pressure  3.Statin:   Yes [ x  ]                  No  [   ]                  If No, reason:  4.Ecasa:  Yes  [ x  ]                  No   [   ]  If No,  reason:        Medication List    STOP taking these medications        amLODipine 5 MG tablet  Commonly known as:  NORVASC     nitroGLYCERIN 0.4 MG SL tablet  Commonly known as:  NITROSTAT     pantoprazole 40 MG tablet  Commonly known as:  PROTONIX      TAKE these medications        acetaminophen 325 MG tablet  Commonly known as:  TYLENOL  Take 2 tablets (650 mg total) by mouth every 6 (six) hours as needed for mild pain, fever or headache.     amiodarone 200 MG tablet  Commonly known as:  PACERONE  Take 1 tablet (200 mg total) by mouth daily.     aspirin 325 MG EC tablet  Take 1 tablet (325 mg total) by mouth daily.     atorvastatin 40 MG tablet  Commonly known as:  LIPITOR  Take 1 tablet (40 mg total) by mouth daily at 6 PM.     cephALEXin 500 MG capsule  Commonly known as:  KEFLEX  Take 1 capsule (500 mg total) by mouth 3 (three) times daily. For 7 days     dexlansoprazole 60 MG capsule  Commonly known as:  DEXILANT  Take 1 capsule (60 mg total) by mouth daily.     docusate sodium 100 MG capsule  Commonly known as:  COLACE  Take 2 capsules (200 mg total) by mouth daily.     furosemide 40 MG tablet  Commonly known as:  LASIX  Take 1 tablet (40 mg total) by mouth daily. For 1 week     MECLIZINE HCL PO  Take by mouth.     metoprolol tartrate 25 MG tablet  Commonly known as:  LOPRESSOR  Take 0.5 tablets (12.5 mg total) by mouth 2 (two) times daily.     oxybutynin 10 MG 24 hr tablet  Commonly known as:  DITROPAN-XL  Take 1 tablet (10 mg total) by mouth at bedtime.     potassium chloride SA 20 MEQ tablet  Commonly known as:  K-DUR,KLOR-CON  Take 1 tablet (20 mEq total) by mouth daily. For 7 Days     traMADol 50 MG tablet  Commonly known as:  ULTRAM  Take 1 tablet (50 mg total) by mouth every 6 (six) hours as needed for moderate pain.       Follow-up Information    Follow up with Ivin Poot III, MD On 09/13/2015.   Specialty:   Cardiothoracic Surgery   Contact information:   [address] [place] [address] [phone]       Follow up with [facility] On 09/13/2015.   Contact information:   [place]       Follow-up Information    Follow up with Len Childs, MD On 09/13/2015.   Specialty:  Cardiothoracic Surgery   Contact information:   [address] [place] [address] [phone]       Follow up with [facility] On 09/13/2015.   Contact information:   [employer]        Signed: Ellwood Handler 08/24/2015, 10:56 AM

## 2015-08-24 ENCOUNTER — Inpatient Hospital Stay
Admission: RE | Admit: 2015-08-24 | Discharge: 2015-09-10 | Disposition: A | Discharge status: Home / Self Care | Payer: Medicare Other | Source: Ambulatory Visit | Attending: Internal Medicine | Admitting: Internal Medicine

## 2015-08-24 LAB — CBC
HCT: 31.1 % — ABNORMAL LOW (ref 39.0–52.0)
Hemoglobin: 9.8 g/dL — ABNORMAL LOW (ref 13.0–17.0)
MCH: 27.4 pg (ref 26.0–34.0)
MCHC: 31.5 g/dL (ref 30.0–36.0)
MCV: 86.9 fL (ref 78.0–100.0)
Platelets: 288 10*3/uL (ref 150–400)
RBC: 3.58 MIL/uL — ABNORMAL LOW (ref 4.22–5.81)
RDW: 13.8 % (ref 11.5–15.5)
WBC: 10.4 10*3/uL (ref 4.0–10.5)

## 2015-08-24 LAB — GLUCOSE, CAPILLARY
Glucose-Capillary: 88 mg/dL (ref 65–99)
Glucose-Capillary: 106 mg/dL — ABNORMAL HIGH (ref 65–99)
Glucose-Capillary: 99 mg/dL (ref 65–99)

## 2015-08-24 MED ORDER — ATORVASTATIN CALCIUM 40 MG PO TABS: 40.0000 mg | ORAL_TABLET | Freq: Every day | ORAL | Status: DC

## 2015-08-24 MED ORDER — FUROSEMIDE 40 MG PO TABS: 40.0000 mg | ORAL_TABLET | Freq: Every day | ORAL | Status: DC

## 2015-08-24 MED ORDER — CEPHALEXIN 500 MG PO CAPS: 500.0000 mg | ORAL_CAPSULE | Freq: Three times a day (TID) | ORAL | Status: DC

## 2015-08-24 MED ORDER — AMIODARONE HCL 200 MG PO TABS: 200.0000 mg | ORAL_TABLET | Freq: Every day | ORAL | Status: DC

## 2015-08-24 MED ORDER — ACETAMINOPHEN 325 MG PO TABS: 650.0000 mg | ORAL_TABLET | Freq: Four times a day (QID) | ORAL | Status: DC | PRN

## 2015-08-24 MED ORDER — METOPROLOL TARTRATE 25 MG PO TABS: 12.5000 mg | ORAL_TABLET | Freq: Two times a day (BID) | ORAL | Status: DC

## 2015-08-24 MED ORDER — TRAMADOL HCL 50 MG PO TABS: 50.0000 mg | ORAL_TABLET | Freq: Four times a day (QID) | ORAL | Status: DC | PRN

## 2015-08-24 MED ORDER — ATORVASTATIN CALCIUM 40 MG PO TABS
40.0000 mg | ORAL_TABLET | Freq: Every day | ORAL | Status: DC
  Administered 2015-08-24: 40 mg via ORAL
  Filled 2015-08-24: qty 1

## 2015-08-24 MED ORDER — POTASSIUM CHLORIDE CRYS ER 20 MEQ PO TBCR: 20.0000 meq | EXTENDED_RELEASE_TABLET | Freq: Every day | ORAL | Status: DC

## 2015-08-24 MED ORDER — ASPIRIN 325 MG PO TBEC: 325.0000 mg | DELAYED_RELEASE_TABLET | Freq: Every day | ORAL | Status: DC

## 2015-08-24 MED ORDER — DOCUSATE SODIUM 100 MG PO CAPS: 200.0000 mg | ORAL_CAPSULE | Freq: Every day | ORAL | Status: DC

## 2015-08-24 MED ORDER — OXYBUTYNIN CHLORIDE ER 10 MG PO TB24: 10.0000 mg | ORAL_TABLET | Freq: Every day | ORAL | Status: DC

## 2015-08-24 NOTE — Progress Notes (Signed)
Pt has [employer] approval for SN[facility] is able to accept today.  Pt daughter informed  CSW will continue to follow  Domenica Reamer, [place] Social Worker [phone]

## 2015-08-24 NOTE — Progress Notes (Signed)
Patient will discharge to [facility] Anticipated discharge date:08/24/15 Family notified: pt dtr Transportation by [facility]- RN to call when pt ready  CSW signing off.  Domenica Reamer, [place] Social Worker [phone]

## 2015-08-24 NOTE — Care Management Note (Signed)
Case Management Note   Patient Details  Name: Tyler Mejia MRN: [id number] Date of Birth: 1936/05/22  Subjective/Objective:   Pt admitted s/p cath with severe vessel diease found- plan for CABG 08/11/15 am                 Action/Plan: PTA pt lived at home- NCM to follow post op for d/c needs  Expected Discharge Date:     08/24/15             Expected Discharge Plan:  Skilled Nursing Facility  In-House Referral:  Clinical Social Work  Discharge planning Services  CM Consult  Post Acute Care Choice:    Choice offered to:     DME Arranged:    DME Agency:     HH Arranged:    [place] Agency:     Status of Service:  Completed, signed off  Medicare Important Message Given:  Yes-third notification given Date Medicare IM Given:    Medicare IM give by:    Date Additional Medicare IM Given:    Additional Medicare Important Message give by:     If discussed at [place] of Stay Meetings, dates discussed:  08/24/15  Additional Comments:  9/151/6- pt with post op ileus now resolved- for d/c today to [facility]- CSW following for placement needs- plan for [facility].   Dawayne Patricia, RN 08/24/2015, 11:44 AM

## 2015-08-24 NOTE — Discharge Instructions (Signed)
Coronary Artery Bypass Grafting, Care After °Refer to this sheet in the next few weeks. These instructions provide you with information on caring for yourself after your procedure. Your health care provider may also give you more specific instructions. Your treatment has been planned according to current medical practices, but problems sometimes occur. Call your health care provider if you have any problems or questions after your procedure. °WHAT TO EXPECT AFTER THE PROCEDURE °Recovery from surgery will be different for everyone. Some people feel well after 3 or 4 weeks, while for others it takes longer. After your procedure, it is typical to have the following: °· Nausea and a lack of appetite.   °· Constipation. °· Weakness and fatigue.   °· Depression or irritability.   °· Pain or discomfort at your incision site. °HOME CARE INSTRUCTIONS °· Take medicines only as directed by your health care provider. Do not stop taking medicines or start any new medicines without first checking with your health care provider. °· Take your pulse as directed by your health care provider. °· Perform deep breathing as directed by your health care provider. If you were given a device called an incentive spirometer, use it to practice deep breathing several times a day. Support your chest with a pillow or your arms when you take deep breaths or cough. °· Keep incision areas clean, dry, and protected. Remove or change any bandages (dressings) only as directed by your health care provider. You may have skin adhesive strips over the incision areas. Do not take the strips off. They will fall off on their own. °· Check incision areas daily for any swelling, redness, or drainage. °· If incisions were made in your legs, do the following: °¨ Avoid crossing your legs.   °¨ Avoid sitting for long periods of time. Change positions every 30 minutes.   °¨ Elevate your legs when you are sitting. °· Wear compression stockings as directed by your  health care provider. These stockings help keep blood clots from forming in your legs. °· Take showers once your health care provider approves. Until then, only take sponge baths. Pat incisions dry. Do not rub incisions with a washcloth or towel. Do not take baths, swim, or use a hot tub until your health care provider approves. °· Eat foods that are high in fiber, such as raw fruits and vegetables, whole grains, beans, and nuts. Meats should be lean cut. Avoid canned, processed, and fried foods. °· Drink enough fluid to keep your urine clear or pale yellow. °· Weigh yourself every day. This helps identify if you are retaining fluid that may make your heart and lungs work harder. °· Rest and limit activity as directed by your health care provider. You may be instructed to: °¨ Stop any activity at once if you have chest pain, shortness of breath, irregular heartbeats, or dizziness. Get help right away if you have any of these symptoms. °¨ Move around frequently for short periods or take short walks as directed by your health care provider. Increase your activities gradually. You may need physical therapy or cardiac rehabilitation to help strengthen your muscles and build your endurance. °¨ Avoid lifting, pushing, or pulling anything heavier than 10 lb (4.5 kg) for at least 6 weeks after surgery. °· Do not drive until your health care provider approves.  °· Ask your health care provider when you may return to work. °· Ask your health care provider when you may resume sexual activity. °· Keep all follow-up visits as directed by your health care   provider. This is important. °SEEK MEDICAL CARE IF: °· You have swelling, redness, increasing pain, or drainage at the site of an incision. °· You have a fever. °· You have swelling in your ankles or legs. °· You have pain in your legs.   °· You gain 2 or more pounds (0.9 kg) a day. °· You are nauseous or vomit. °· You have diarrhea.  °SEEK IMMEDIATE MEDICAL CARE IF: °· You have  chest pain that goes to your jaw or arms. °· You have shortness of breath.   °· You have a fast or irregular heartbeat.   °· You notice a "clicking" in your breastbone (sternum) when you move.   °· You have numbness or weakness in your arms or legs. °· You feel dizzy or light-headed.   °MAKE SURE YOU: °· Understand these instructions. °· Will watch your condition. °· Will get help right away if you are not doing well or get worse. °Document Released: 06/14/2005 Document Revised: 04/11/2014 Document Reviewed: 05/04/2013 °Exit[employer]® Patient Information ©2015 [employer]. This information is not intended to replace advice given to you by your health care provider. Make sure you discuss any questions you have with your health care provider. ° °Endoscopic Saphenous Vein Harvesting °Care After °Refer to this sheet in the next few weeks. These instructions provide you with information on caring for yourself after your procedure. Your health care provider may also give you more specific instructions. Your treatment has been planned according to current medical practices, but problems sometimes occur. Call your health care provider if you have any problems or questions after your procedure. °HOME CARE INSTRUCTIONS °Medicine °· Take whatever pain medicine your surgeon prescribes. Follow the directions carefully. Do not take over-the-counter pain medicine unless your surgeon says it is okay. Some pain medicine can cause bleeding problems for several weeks after surgery. °· Follow your surgeon's instructions about driving. You will probably not be permitted to drive after heart surgery. °· Take any medicines your surgeon prescribes. Any medicines you took before your heart surgery should be checked with your health care provider before you start taking them again. °Wound care °· If your surgeon has prescribed an elastic bandage or stocking, ask how long you should wear it. °· Check the area around your surgical cuts  (incisions) whenever your bandages (dressings) are changed. Look for any redness or swelling. °· You will need to return to have the stitches (sutures) or staples taken out. Ask your surgeon when to do that. °· Ask your surgeon when you can shower or bathe. °Activity °· Try to keep your legs raised when you are sitting. °· Do any exercises your health care providers have given you. These may include deep breathing exercises, coughing, walking, or other exercises. °SEEK MEDICAL CARE IF: °· You have any questions about your medicines. °· You have more leg pain, especially if your pain medicine stops working. °· New or growing bruises develop on your leg. °· Your leg swells, feels tight, or becomes red. °· You have numbness in your leg. °SEEK IMMEDIATE MEDICAL CARE IF: °· Your pain gets much worse. °· Blood or fluid leaks from any of the incisions. °· Your incisions become warm, swollen, or red. °· You have chest pain. °· You have trouble breathing. °· You have a fever. °· You have more pain near your leg incision. °MAKE SURE YOU: °· Understand these instructions. °· Will watch your condition. °· Will get help right away if you are not doing well or get worse. °Document Released: 08/07/2011   Document Revised: 11/30/2013 Document Reviewed: 08/07/2011 °ExitCare® Patient Information ©2015 [employer]. This information is not intended to replace advice given to you by your health care provider. Make sure you discuss any questions you have with your health care provider. ° ° °

## 2015-08-24 NOTE — Progress Notes (Signed)
Patient EPW removed. Patient VSS. Patient tolerated procedure well. Patient on bedrest for an hour with vitals every 15 minutes. Will continue to monitor.   Domingo Dimes RN

## 2015-08-24 NOTE — Progress Notes (Addendum)
      [address]       ,[place] [address]             [phone]      13 Days Post-Op Procedure(s) (LRB): CORONARY ARTERY BYPASS GRAFTING (CABG) X4 UTILIZING THE LEFT INTERNAL MAMMARY ARTERY TO LAD AND ENDOSCOPICALLY HARVESTED BILATERAL SAPHENEOUS VEINS TO DIAGONAL,OM1 AND OM2 (N/A) TRANSESOPHAGEAL ECHOCARDIOGRAM (TEE) (N/A)   Subjective:  Mr. Beilke has no complaints this morning.  He states he tolerated the adjustment in his diet.  He ambulated 3x yesterday.  + BM  Objective: Vital signs in last 24 hours: Temp:  [97.9 F (36.6 C)-98.2 F (36.8 C)] 97.9 F (36.6 C) (09/15 0086) Pulse Rate:  [67-80] 67 (09/15 7619) Cardiac Rhythm:  [-] Normal sinus rhythm (09/14 2025) Resp:  [17-20] 20 (09/15 0633) BP: (113-130)/(58-61) 122/58 mmHg (09/15 0633) SpO2:  [94 %-97 %] 94 % (09/15 0633) Weight:  [225 lb 15.5 oz (102.5 kg)] 225 lb 15.5 oz (102.5 kg) (09/15 5093)  Intake/Output from previous day: 09/14 0701 - 09/15 0700 In: 650 [P.O.:630; I.V.:20] Out: 375 [Urine:375]  General appearance: alert, cooperative, no distress Heart: regular rate and rhythm Lungs: clear to auscultation bilaterally Abdomen: soft, non-tender; bowel sounds normal; no masses,  no organomegaly Extremities: edema trace Wound: clean and dry  Lab Results:  Recent Labs  08/23/15 0447 08/24/15 0537  WBC 13.2* 10.4  HGB 9.9* 9.8*  HCT 31.3* 31.1*  PLT 285 288   BMET: No results for input(s): NA, K, CL, CO2, GLUCOSE, BUN, CREATININE, CALCIUM in the last 72 hours.  PT/INR: No results for input(s): LABPROT, INR in the last 72 hours. ABG    Component Value Date/Time   PHART 7.386 08/12/2015 0500   HCO3 24.9* 08/12/2015 0500   TCO2 29 08/19/2015 1632   ACIDBASEDEF 2.0 08/11/2015 1938   O2SAT 94.8 08/12/2015 0500   CBG (last 3)   Recent Labs  08/23/15 1705 08/23/15 2121 08/24/15 0628  GLUCAP 116* 108* 88    Assessment/Plan: S/P Procedure(s) (LRB): CORONARY ARTERY BYPASS  GRAFTING (CABG) X4 UTILIZING THE LEFT INTERNAL MAMMARY ARTERY TO LAD AND ENDOSCOPICALLY HARVESTED BILATERAL SAPHENEOUS VEINS TO DIAGONAL,OM1 AND OM2 (N/A) TRANSESOPHAGEAL ECHOCARDIOGRAM (TEE) (N/A)  1. CV- hemodynamically stable- continue amiodarone, lopressor 2. Pulm- no acute issues, continue IS 3. Renal- wegiht continues to trend down, will give 1 more week of Lasix 4. GI- Ileus resolved, tolerating diet, moving bowels 5. ID- leukocytosis resolving, remains afebrile, UA negative- on Primaxin for presumed intra-abdominal infection 6. Deconditioning- SNF placement, insurance pending 7. Dispo- patient stable, plan to d/c to SNF today if bed available   LOS: 15 days    BARRETT, ERIN 08/24/2015  patient examined and medical record reviewed,agree with above note. Tharon Aquas Trigt III 08/24/2015

## 2015-08-24 NOTE — Progress Notes (Signed)
[id number] Education completed with pt and daughter who voiced understanding. Encouraged IS and flutter valve. Discussed CRP 2 and referring to [facility] program. Graylon Good RN BSN 08/24/2015 3:21 PM

## 2015-08-24 NOTE — Progress Notes (Signed)
Patient D/C with [facility] service to [facility]. Report called to [facility]. Patient IV removed. Tele box removed.   Domingo Dimes RN

## 2015-08-25 LAB — URINE CULTURE: Culture: 50000

## 2015-08-30 ENCOUNTER — Other Ambulatory Visit (HOSPITAL_COMMUNITY)
Admission: RE | Admit: 2015-08-30 | Discharge: 2015-08-30 | Disposition: A | Discharge status: Home / Self Care | Payer: Medicare Other | Source: Skilled Nursing Facility | Attending: Pulmonary Disease | Admitting: Pulmonary Disease

## 2015-08-30 DIAGNOSIS — J029 Acute pharyngitis, unspecified: Secondary | ICD-10-CM | POA: Diagnosis present

## 2015-08-30 LAB — RAPID STREP SCREEN (MED CTR MEBANE ONLY): STREPTOCOCCUS, GROUP A SCREEN (DIRECT): NEGATIVE

## 2015-09-01 LAB — CULTURE, GROUP A STREP: Strep A Culture: NEGATIVE

## 2015-09-04 ENCOUNTER — Encounter (HOSPITAL_COMMUNITY)
Admission: RE | Admit: 2015-09-04 | Discharge: 2015-09-04 | Disposition: A | Discharge status: Home / Self Care | Payer: Medicare Other | Source: Skilled Nursing Facility | Attending: Internal Medicine | Admitting: Internal Medicine

## 2015-09-04 LAB — HEPATIC FUNCTION PANEL
AST: 27 U/L (ref 15–41)
Albumin: 3.1 g/dL — ABNORMAL LOW (ref 3.5–5.0)
Alkaline Phosphatase: 79 U/L (ref 38–126)
BILIRUBIN DIRECT: 0.2 mg/dL (ref 0.1–0.5)
BILIRUBIN TOTAL: 0.8 mg/dL (ref 0.3–1.2)
Indirect Bilirubin: 0.6 mg/dL (ref 0.3–0.9)
Total Protein: 6.6 g/dL (ref 6.5–8.1)

## 2015-09-04 LAB — TSH: TSH: 3.464 u[IU]/mL (ref 0.350–4.500)

## 2015-09-11 ENCOUNTER — Encounter: Payer: Self-pay | Admitting: *Deleted

## 2015-09-11 NOTE — Progress Notes (Deleted)
Cardiology Office Note Date:  09/11/2015  Patient ID:  Mejia, Tyler [birth year], MRN [id number] PCP:  [name], MD  Cardiologist:  Dr. Domenic Polite  ***refresh   Chief Complaint: f/u CABG  History of Present Illness: Tyler Mejia is a 79 y.o. male with history of CAD (s/p DES to RCA in 2013 with residual dz initially treated medically, s/p CABGx4 08/11/2015 w/ transient post-op AF), GERD, HTN, HLD with multiple statin intolerances, COPD, obesity, Schatzki's ring, hiatal hernia who presents for post-hospital follow-up.  He was recently evaluated for progressive exertional angina. Although noninvasive imaging was unremarkable earlier in the year, he underwent LHC due to concern for progressive ischemia not picked up by exam. Cath 08/09/15 showed patent stents but progressive CAD, normal EF 55-65%. He was evaluated by CVTS and underwent CABG x4 (left internal mammary artery to LAD, saphenous vein graft to diagonal, saphenous vein graft to OM1, and saphenous vein graft to OM2). He developed post-op AF transiently treated with IV Amiodarone with successful conversion to NSR. Hospital course also notable for ileus vs bowel obstruction requiring NGT and TPN. TSH and baseline LFTs ok on 09/04/15.   cardiac rehab lipids post-op AF - amio per tcts   Past Medical History  Diagnosis Date  . GERD (gastroesophageal reflux disease)   . Essential hypertension   . Hx of adenomatous colonic polyps   . Nephrolithiasis   . NSTEMI (non-ST elevated myocardial infarction) [facility]) March 2013  . Obesity   . HLD (hyperlipidemia)     a. H/o multiple statin intolerances.  Marland Kitchen COPD (chronic obstructive pulmonary disease) ([place])   . CAD (coronary artery disease)     a. DES to the RCA March 2013 with residual dz rx medically. b. s/p CABGx4 in 08/2015.  . IBS (irritable bowel syndrome)   . Atrial fibrillation, transient ([place])     a. Post-op from CABG 08/2015.    Past Surgical History  Procedure Laterality Date  .  Appendectomy    . Esophagogastroduodenoscopy  2003    distal erosion/small hiatal hernia  . Colonoscopy  08/21/2004    Pedunculated polyp at the splenic flexure  . Colonoscopy  09/2009    Dr. Rourk--> Cecal and hepatic flexure polyps, tubular adenomas. Next colonoscopy October 2013  . Esophagogastroduodenoscopy  09/2009    Dr. Evalee Mutton ring, mild distal esophageal erosions, status post Maloney dilation, small hiatal hernia.  . Back surgery    . Colonoscopy N/A 05/07/2013    RMR: colonic polyp/colonic diverticulosis  . Left heart catheterization with coronary angiogram N/A 03/02/2012    Procedure: LEFT HEART CATHETERIZATION WITH CORONARY ANGIOGRAM;  Surgeon: Peter M Martinique, MD;  Location: [facility] CATH LAB;  Service: Cardiovascular;  Laterality: N/A;  . Percutaneous coronary stent intervention (pci-s) N/A 03/02/2012    Procedure: PERCUTANEOUS CORONARY STENT INTERVENTION (PCI-S);  Surgeon: Peter M Martinique, MD;  Location: [facility] CATH LAB;  Service: Cardiovascular;  Laterality: N/A;  . Esophagogastroduodenoscopy N/A 03/16/2015    PYK:DXIPJ HH/focal area of distal esophageal s/p bx  . Cardiac catheterization N/A 08/09/2015    Procedure: Left Heart Cath and Coronary Angiography;  Surgeon: Jettie Booze, MD;  Location: [facility] CV LAB;  Service: Cardiovascular;  Laterality: N/A;  . Coronary artery bypass graft N/A 08/11/2015    Procedure: CORONARY ARTERY BYPASS GRAFTING (CABG) X4 UTILIZING THE LEFT INTERNAL MAMMARY ARTERY TO LAD AND ENDOSCOPICALLY HARVESTED BILATERAL SAPHENEOUS VEINS TO DIAGONAL,OM1 AND OM2;  Surgeon: Ivin Poot, MD;  Location: [place];  Service: Open Heart  Surgery;  Laterality: N/A;  . Tee without cardioversion N/A 08/11/2015    Procedure: TRANSESOPHAGEAL ECHOCARDIOGRAM (TEE);  Surgeon: Ivin Poot, MD;  Location: [place];  Service: Open Heart Surgery;  Laterality: N/A;    No current outpatient prescriptions on file.   No current facility-administered medications for this  visit.    Allergies:   Codeine   Social History:  The patient  reports that he quit smoking about 13 years ago. His smoking use included Cigarettes. He has a 67.5 pack-year smoking history. He has never used smokeless tobacco. He reports that he does not drink alcohol or use illicit drugs.   Family History:  The patient's family history includes Breast cancer in his sister; Diabetes in his mother; Esophageal cancer in his brother; Heart disease in his mother; Pneumonia in his father. There is no history of Colon cancer.***  ROS:  Please see the history of present illness. Otherwise, review of systems is positive for ***.   All other systems are reviewed and otherwise negative.   PHYSICAL EXAM: *** VS:  There were no vitals taken for this visit. BMI: There is no weight on file to calculate BMI. Well nourished, well developed, in no acute distress HEENT: normocephalic, atraumatic Neck: no JVD, carotid bruits or masses Cardiac:  normal S1, S2; RRR; no murmurs, rubs, or gallops Lungs:  clear to auscultation bilaterally, no wheezing, rhonchi or rales Abd: soft, nontender, no hepatomegaly, + BS MS: no deformity or atrophy Ext: no edema Skin: warm and dry, no rash Neuro:  moves all extremities spontaneously, no focal abnormalities noted, follows commands Psych: euthymic mood, full affect   EKG:  Done today shows ***  Recent Labs: 08/19/2015: Magnesium 2.2 08/20/2015: BUN 26*; Creatinine, Ser 1.06; Potassium 3.9; Sodium 141 08/24/2015: Hemoglobin 9.8*; Platelets 288 09/04/2015: ALT <5*; TSH 3.464  08/11/2015: Cholesterol 138; HDL 27*; LDL Cholesterol 85; Total CHOL/HDL Ratio 5.1; Triglycerides 128; VLDL 26   CrCl cannot be calculated (Unknown ideal weight.).   Wt Readings from Last 3 Encounters:  08/24/15 225 lb 15.5 oz (102.5 kg)  08/07/15 244 lb (110.678 kg)  03/16/15 243 lb (110.224 kg)     Other studies reviewed: Additional studies/records reviewed today include: summarized  above***  ASSESSMENT AND PLAN:  1. CAD s/p prior PCI; s/p CABGx4 in 08/2015 2. Transient post-op AF 3. Essential HTN 4. Hyperlipidemia 5. Obesity  Disposition: F/u with ***  Current medicines are reviewed at length with the patient today.  The patient did not have any concerns regarding medicines.***  Signed, Melina Copa PA-C 09/11/2015 1:32 PM     [address] [place] [address] [phone] (office)  [phone] (fax)

## 2015-09-12 ENCOUNTER — Encounter: Payer: Medicare Other | Admitting: Physician Assistant

## 2015-09-12 ENCOUNTER — Emergency Department (HOSPITAL_COMMUNITY): Payer: Medicare Other

## 2015-09-12 ENCOUNTER — Other Ambulatory Visit: Payer: Self-pay | Admitting: Cardiothoracic Surgery

## 2015-09-12 ENCOUNTER — Emergency Department (HOSPITAL_COMMUNITY)
Admission: EM | Admit: 2015-09-12 | Discharge: 2015-09-12 | Disposition: A | Discharge status: Home / Self Care | Payer: Medicare Other | Attending: Emergency Medicine | Admitting: Emergency Medicine

## 2015-09-12 ENCOUNTER — Encounter (HOSPITAL_COMMUNITY): Payer: Self-pay

## 2015-09-12 DIAGNOSIS — J449 Chronic obstructive pulmonary disease, unspecified: Secondary | ICD-10-CM | POA: Insufficient documentation

## 2015-09-12 DIAGNOSIS — Z79899 Other long term (current) drug therapy: Secondary | ICD-10-CM | POA: Insufficient documentation

## 2015-09-12 DIAGNOSIS — K219 Gastro-esophageal reflux disease without esophagitis: Secondary | ICD-10-CM | POA: Diagnosis not present

## 2015-09-12 DIAGNOSIS — E785 Hyperlipidemia, unspecified: Secondary | ICD-10-CM | POA: Diagnosis not present

## 2015-09-12 DIAGNOSIS — I252 Old myocardial infarction: Secondary | ICD-10-CM | POA: Diagnosis not present

## 2015-09-12 DIAGNOSIS — E669 Obesity, unspecified: Secondary | ICD-10-CM | POA: Insufficient documentation

## 2015-09-12 DIAGNOSIS — Z87891 Personal history of nicotine dependence: Secondary | ICD-10-CM | POA: Diagnosis not present

## 2015-09-12 DIAGNOSIS — Z91040 Latex allergy status: Secondary | ICD-10-CM | POA: Diagnosis not present

## 2015-09-12 DIAGNOSIS — I10 Essential (primary) hypertension: Secondary | ICD-10-CM | POA: Diagnosis not present

## 2015-09-12 DIAGNOSIS — R079 Chest pain, unspecified: Secondary | ICD-10-CM | POA: Diagnosis present

## 2015-09-12 DIAGNOSIS — R1011 Right upper quadrant pain: Secondary | ICD-10-CM | POA: Diagnosis not present

## 2015-09-12 DIAGNOSIS — I2510 Atherosclerotic heart disease of native coronary artery without angina pectoris: Secondary | ICD-10-CM | POA: Insufficient documentation

## 2015-09-12 DIAGNOSIS — Z951 Presence of aortocoronary bypass graft: Secondary | ICD-10-CM

## 2015-09-12 DIAGNOSIS — Z87442 Personal history of urinary calculi: Secondary | ICD-10-CM | POA: Diagnosis not present

## 2015-09-12 HISTORY — DX: Systemic involvement of connective tissue, unspecified: M35.9

## 2015-09-12 LAB — BASIC METABOLIC PANEL
ANION GAP: 9 (ref 5–15)
BUN: 16 mg/dL (ref 6–20)
CALCIUM: 8.7 mg/dL — AB (ref 8.9–10.3)
CO2: 28 mmol/L (ref 22–32)
CREATININE: 1.25 mg/dL — AB (ref 0.61–1.24)
Chloride: 98 mmol/L — ABNORMAL LOW (ref 101–111)
GFR, EST NON AFRICAN AMERICAN: 54 mL/min — AB (ref >60)
Glucose, Bld: 112 mg/dL — ABNORMAL HIGH (ref 65–99)
Potassium: 4.3 mmol/L (ref 3.5–5.1)
SODIUM: 135 mmol/L (ref 135–145)

## 2015-09-12 LAB — HEPATIC FUNCTION PANEL
ALBUMIN: 3.2 g/dL — AB (ref 3.5–5.0)
ALK PHOS: 89 U/L (ref 38–126)
ALT: 16 U/L — ABNORMAL LOW (ref 17–63)
AST: 22 U/L (ref 15–41)
BILIRUBIN INDIRECT: 0.7 mg/dL (ref 0.3–0.9)
Bilirubin, Direct: 0.2 mg/dL (ref 0.1–0.5)
TOTAL PROTEIN: 6.5 g/dL (ref 6.5–8.1)
Total Bilirubin: 0.9 mg/dL (ref 0.3–1.2)

## 2015-09-12 LAB — CBC
HCT: 36.9 % — ABNORMAL LOW (ref 39.0–52.0)
HEMOGLOBIN: 11.4 g/dL — AB (ref 13.0–17.0)
MCH: 26.6 pg (ref 26.0–34.0)
MCHC: 30.9 g/dL (ref 30.0–36.0)
MCV: 86 fL (ref 78.0–100.0)
PLATELETS: 176 10*3/uL (ref 150–400)
RBC: 4.29 MIL/uL (ref 4.22–5.81)
RDW: 14.3 % (ref 11.5–15.5)
WBC: 8.4 10*3/uL (ref 4.0–10.5)

## 2015-09-12 LAB — I-STAT TROPONIN, ED
TROPONIN I, POC: 0 ng/mL (ref 0.00–0.08)
Troponin i, poc: 0.01 ng/mL (ref 0.00–0.08)

## 2015-09-12 LAB — LIPASE, BLOOD: Lipase: 37 U/L (ref 22–51)

## 2015-09-12 MED ORDER — HYDROCODONE-ACETAMINOPHEN 5-325 MG PO TABS: 1.0000 | ORAL_TABLET | Freq: Four times a day (QID) | ORAL | Status: DC | PRN

## 2015-09-12 MED ORDER — SODIUM CHLORIDE 0.9 % IV BOLUS (SEPSIS)
1000.0000 mL | Freq: Once | INTRAVENOUS | Status: AC
  Administered 2015-09-12: 1000 mL via INTRAVENOUS

## 2015-09-12 MED ORDER — HYDROMORPHONE HCL 1 MG/ML IJ SOLN
0.5000 mg | Freq: Once | INTRAMUSCULAR | Status: AC
  Administered 2015-09-12: 0.5 mg via INTRAVENOUS
  Filled 2015-09-12: qty 1

## 2015-09-12 MED ORDER — ONDANSETRON 4 MG PO TBDP: 4.0000 mg | ORAL_TABLET | Freq: Three times a day (TID) | ORAL | Status: DC | PRN

## 2015-09-12 MED ORDER — IOHEXOL 300 MG/ML  SOLN
100.0000 mL | Freq: Once | INTRAMUSCULAR | Status: AC | PRN
  Administered 2015-09-12: 100 mL via INTRAVENOUS

## 2015-09-12 MED ORDER — ASPIRIN 81 MG PO CHEW
324.0000 mg | CHEWABLE_TABLET | Freq: Once | ORAL | Status: AC
  Administered 2015-09-12: 324 mg via ORAL
  Filled 2015-09-12: qty 4

## 2015-09-12 MED ORDER — MORPHINE SULFATE (PF) 4 MG/ML IV SOLN
4.0000 mg | Freq: Once | INTRAVENOUS | Status: AC
  Administered 2015-09-12: 4 mg via INTRAVENOUS
  Filled 2015-09-12: qty 1

## 2015-09-12 MED ORDER — ONDANSETRON HCL 4 MG/2ML IJ SOLN
4.0000 mg | Freq: Once | INTRAMUSCULAR | Status: AC
  Administered 2015-09-12: 4 mg via INTRAVENOUS
  Filled 2015-09-12: qty 2

## 2015-09-12 NOTE — ED Notes (Signed)
Patient transported to X-ray 

## 2015-09-12 NOTE — ED Notes (Signed)
[phone]. Tyler Mejia - son

## 2015-09-12 NOTE — Discharge Instructions (Signed)
Biliary Colic  °Biliary colic is a steady or irregular pain in the upper abdomen. It is usually under the right side of the rib cage. It happens when gallstones interfere with the normal flow of bile from the gallbladder. Bile is a liquid that helps to digest fats. Bile is made in the liver and stored in the gallbladder. When you eat a meal, bile passes from the gallbladder through the cystic duct and the common bile duct into the small intestine. There, it mixes with partially digested food. If a gallstone blocks either of these ducts, the normal flow of bile is blocked. The muscle cells in the bile duct contract forcefully to try to move the stone. This causes the pain of biliary colic.  °SYMPTOMS  °· A person with biliary colic usually complains of pain in the upper abdomen. This pain can be: °¨ In the center of the upper abdomen just below the breastbone. °¨ In the upper-right part of the abdomen, near the gallbladder and liver. °¨ Spread back toward the right shoulder blade. °· Nausea and vomiting. °· The pain usually occurs after eating. °· Biliary colic is usually triggered by the digestive system's demand for bile. The demand for bile is high after fatty meals. Symptoms can also occur when a person who has been fasting suddenly eats a very large meal. Most episodes of biliary colic pass after 1 to 5 hours. After the most intense pain passes, your abdomen may continue to ache mildly for about 24 hours. °DIAGNOSIS  °After you describe your symptoms, your caregiver will perform a physical exam. He or she will pay attention to the upper right portion of your belly (abdomen). This is the area of your liver and gallbladder. An ultrasound will help your caregiver look for gallstones. Specialized scans of the gallbladder may also be done. Blood tests may be done, especially if you have fever or if your pain persists. °PREVENTION  °Biliary colic can be prevented by controlling the risk factors for gallstones. Some of  these risk factors, such as heredity, increasing age, and pregnancy are a normal part of life. Obesity and a high-fat diet are risk factors you can change through a healthy lifestyle. Women going through menopause who take hormone replacement therapy (estrogen) are also more likely to develop biliary colic. °TREATMENT  °· Pain medication may be prescribed. °· You may be encouraged to eat a fat-free diet. °· If the first episode of biliary colic is severe, or episodes of colic keep retuning, surgery to remove the gallbladder (cholecystectomy) is usually recommended. This procedure can be done through small incisions using an instrument called a laparoscope. The procedure often requires a brief stay in the hospital. Some people can leave the hospital the same day. It is the most widely used treatment in people troubled by painful gallstones. It is effective and safe, with no complications in more than 90% of cases. °· If surgery cannot be done, medication that dissolves gallstones may be used. This medication is expensive and can take months or years to work. Only small stones will dissolve. °· Rarely, medication to dissolve gallstones is combined with a procedure called shock-wave lithotripsy. This procedure uses carefully aimed shock waves to break up gallstones. In many people treated with this procedure, gallstones form again within a few years. °PROGNOSIS  °If gallstones block your cystic duct or common bile duct, you are at risk for repeated episodes of biliary colic. There is also a 25% chance that you will develop   a gallbladder infection(acute cholecystitis), or some other complication of gallstones within 10 to 20 years. If you have surgery, schedule it at a time that is convenient for you and at a time when you are not sick. °HOME CARE INSTRUCTIONS  °· Drink plenty of clear fluids. °· Avoid fatty, greasy or fried foods, or any foods that make your pain worse. °· Take medications as directed. °SEEK MEDICAL  CARE IF:  °· You develop a fever over 100.5° F (38.1° C). °· Your pain gets worse over time. °· You develop nausea that prevents you from eating and drinking. °· You develop vomiting. °SEEK IMMEDIATE MEDICAL CARE IF:  °· You have continuous or severe belly (abdominal) pain which is not relieved with medications. °· You develop nausea and vomiting which is not relieved with medications. °· You have symptoms of biliary colic and you suddenly develop a fever and shaking chills. This may signal cholecystitis. Call your caregiver immediately. °· You develop a yellow color to your skin or the white part of your eyes (jaundice). °Document Released: 04/28/2006 Document Revised: 02/17/2012 Document Reviewed: 07/07/2008 °[employer]® Patient Information ©2015 [employer]. This information is not intended to replace advice given to you by your health care provider. Make sure you discuss any questions you have with your health care provider. ° °

## 2015-09-12 NOTE — ED Provider Notes (Signed)
CSN: [id number]     Arrival date & time 09/12/15  0636 History   First MD Initiated Contact with Patient 09/12/15 0701     Chief Complaint  Patient presents with  . Chest Pain     (Consider location/radiation/quality/duration/timing/severity/associated sxs/prior Treatment) HPI Comments: 79 year old male with extensive past medical history including CAD status post CABG, hypertension, hyperlipidemia, COPD who presents with abdominal pain. Patient states that at approximately 8:30 PM last night, he began having aching upper abdominal pain which feels like a pressure that starts in his upper abdomen and moves into his chest. The pain is moderate to severe and constant. He feels mildly distended and has had problems with constipation recently despite taking milk of magnesia and a stool softener. Last bowel movement was yesterday morning. He reports that his symptoms are consistent with the postoperative ileus that he had while hospitalized. He denies any sharp chest pain or anginal symptoms that he had before his surgery. He has mild chronic shortness of breath that has not changed recently. He endorses nausea and had a few episodes of vomiting yesterday. No fevers or cough/cold symptoms.  Patient is a 79 y.o. male presenting with chest pain. The history is provided by the patient.  Chest Pain   Past Medical History  Diagnosis Date  . GERD (gastroesophageal reflux disease)   . Essential hypertension   . Hx of adenomatous colonic polyps   . Nephrolithiasis   . NSTEMI (non-ST elevated myocardial infarction) [facility]) March 2013  . Obesity   . HLD (hyperlipidemia)     a. H/o multiple statin intolerances.  Marland Kitchen COPD (chronic obstructive pulmonary disease) ([place])   . CAD (coronary artery disease)     a. DES to the RCA March 2013 with residual dz rx medically. b. s/p CABGx4 in 08/2015.  . IBS (irritable bowel syndrome)   . Atrial fibrillation, transient ([place])     a. Post-op from CABG 08/2015.  . Collagen  vascular disease [facility])    Past Surgical History  Procedure Laterality Date  . Appendectomy    . Esophagogastroduodenoscopy  2003    distal erosion/small hiatal hernia  . Colonoscopy  08/21/2004    Pedunculated polyp at the splenic flexure  . Colonoscopy  09/2009    Dr. Rourk--> Cecal and hepatic flexure polyps, tubular adenomas. Next colonoscopy October 2013  . Esophagogastroduodenoscopy  09/2009    Dr. Evalee Mutton ring, mild distal esophageal erosions, status post Maloney dilation, small hiatal hernia.  . Back surgery    . Colonoscopy N/A 05/07/2013    RMR: colonic polyp/colonic diverticulosis  . Left heart catheterization with coronary angiogram N/A 03/02/2012    Procedure: LEFT HEART CATHETERIZATION WITH CORONARY ANGIOGRAM;  Surgeon: Peter M Martinique, MD;  Location: [facility] CATH LAB;  Service: Cardiovascular;  Laterality: N/A;  . Percutaneous coronary stent intervention (pci-s) N/A 03/02/2012    Procedure: PERCUTANEOUS CORONARY STENT INTERVENTION (PCI-S);  Surgeon: Peter M Martinique, MD;  Location: [facility] CATH LAB;  Service: Cardiovascular;  Laterality: N/A;  . Esophagogastroduodenoscopy N/A 03/16/2015    FBP:[facility] HH/focal area of distal esophageal s/p bx  . Cardiac catheterization N/A 08/09/2015    Procedure: Left Heart Cath and Coronary Angiography;  Surgeon: Jettie Booze, MD;  Location: [place] CV LAB;  Service: Cardiovascular;  Laterality: N/A;  . Coronary artery bypass graft N/A 08/11/2015    Procedure: CORONARY ARTERY BYPASS GRAFTING (CABG) X4 UTILIZING THE LEFT INTERNAL MAMMARY ARTERY TO LAD AND ENDOSCOPICALLY HARVESTED BILATERAL SAPHENEOUS VEINS TO DIAGONAL,OM1 AND OM2;  Surgeon:  Ivin Poot, MD;  Location: [place];  Service: Open Heart Surgery;  Laterality: N/A;  . Tee without cardioversion N/A 08/11/2015    Procedure: TRANSESOPHAGEAL ECHOCARDIOGRAM (TEE);  Surgeon: Ivin Poot, MD;  Location: [place];  Service: Open Heart Surgery;  Laterality: N/A;   Family History  Problem  Relation Age of Onset  . Pneumonia Father   . Heart disease Mother   . Breast cancer Sister     2 sisters  . Esophageal cancer Brother   . Diabetes Mother   . Colon cancer Neg Hx    Social History  Substance Use Topics  . Smoking status: Former Smoker -- 1.50 packs/day for 45 years    Types: Cigarettes    Quit date: 12/09/2001  . Smokeless tobacco: Never Used     Comment: quit about 9 years ago  . Alcohol Use: No     Comment: Very rarely    Review of Systems  Cardiovascular: Positive for chest pain.   10 Systems reviewed and are negative for acute change except as noted in the HPI.    Allergies  Codeine  Home Medications   Prior to Admission medications   Medication Sig Start Date End Date Taking? Authorizing Provider  acetaminophen (TYLENOL) 325 MG tablet Take 2 tablets (650 mg total) by mouth every 6 (six) hours as needed for mild pain, fever or headache. 08/24/15  Yes Erin R Barrett, PA-C  amiodarone (PACERONE) 200 MG tablet Take 1 tablet (200 mg total) by mouth daily. 08/24/15  Yes Erin R Barrett, PA-C  aspirin EC 325 MG EC tablet Take 1 tablet (325 mg total) by mouth daily. 08/24/15  Yes Erin R Barrett, PA-C  dexlansoprazole (DEXILANT) 60 MG capsule Take 1 capsule (60 mg total) by mouth daily. 03/31/15  Yes Daneil Dolin, MD  docusate sodium (COLACE) 100 MG capsule Take 2 capsules (200 mg total) by mouth daily. 08/24/15  Yes Erin R Barrett, PA-C  meclizine (ANTIVERT) 25 MG tablet Take 25 mg by mouth 3 (three) times daily as needed for dizziness.   Yes Historical Provider, MD  metoprolol tartrate (LOPRESSOR) 25 MG tablet Take 0.5 tablets (12.5 mg total) by mouth 2 (two) times daily. 08/24/15  Yes Erin R Barrett, PA-C  oxybutynin (DITROPAN-XL) 10 MG 24 hr tablet Take 1 tablet (10 mg total) by mouth at bedtime. 08/24/15  Yes Erin R Barrett, PA-C  atorvastatin (LIPITOR) 40 MG tablet Take 1 tablet (40 mg total) by mouth daily at 6 PM. Patient not taking: Reported on 09/12/2015  08/24/15   Erin R Barrett, PA-C   BP 169/57 mmHg  Pulse 86  Resp 17  SpO2 93% Physical Exam  Constitutional: He is oriented to person, place, and time. He appears well-developed and well-nourished. No distress.  HENT:  Head: Normocephalic and atraumatic.  Dry mucous membranes  Eyes: Conjunctivae are normal. Pupils are equal, round, and reactive to light.  Neck: Neck supple.  Cardiovascular: Normal rate, regular rhythm and normal heart sounds.   No murmur heard. Healing sternotomy scar  Pulmonary/Chest: Effort normal. He has no wheezes.  Crackles in bilateral bases  Abdominal: Soft. Bowel sounds are normal.  Mild abdominal distention, tenderness to palpation of the midepigastrium with no rebound or guarding; no lower abdominal tenderness  Musculoskeletal: He exhibits no edema.  Neurological: He is alert and oriented to person, place, and time.  Fluent speech  Skin: Skin is warm and dry.  Psychiatric: He has a normal mood and affect. Judgment  normal.  Nursing note and vitals reviewed.   ED Course  Procedures (including critical care time) Labs Review Labs Reviewed  BASIC METABOLIC PANEL - Abnormal; Notable for the following:    Chloride 98 (*)    Glucose, Bld 112 (*)    Creatinine, Ser 1.25 (*)    Calcium 8.7 (*)    GFR calc non Af Amer 54 (*)    All other components within normal limits  CBC - Abnormal; Notable for the following:    Hemoglobin 11.4 (*)    HCT 36.9 (*)    All other components within normal limits  HEPATIC FUNCTION PANEL - Abnormal; Notable for the following:    Albumin 3.2 (*)    ALT 16 (*)    All other components within normal limits  LIPASE, BLOOD  I-STAT TROPOININ, ED  Randolm Idol, ED    Imaging Review Dg Chest 2 View  09/12/2015   CLINICAL DATA:  Chest pain.  EXAM: CHEST  2 VIEW  COMPARISON:  August 23, 2015  FINDINGS: There is mild bibasilar atelectasis. There is also mild bilateral apical pleural thickening. There is no edema or  consolidation. The heart size and pulmonary vascularity are normal. Patient is status post coronary artery bypass grafting. No adenopathy. No pneumothorax. There is mild degenerative change in the thoracic spine.  IMPRESSION: Areas of mild scarring.  No edema or consolidation.   Electronically Signed   By: Lowella Grip III M.D.   On: 09/12/2015 08:11   Dg Abd 1 View  09/12/2015   CLINICAL DATA:  Diarrhea for 2 days.  EXAM: ABDOMEN - 1 VIEW  COMPARISON:  08/18/2015.  FINDINGS: The bowel gas pattern is nonobstructive. Mid abdominal small bowel loops appear nondistended. No upright film was obtained. No radio-opaque calculi or other significant radiographic abnormality are seen. Lumbar degenerative change.  IMPRESSION: Improved appearance from priors. Mid abdominal small bowel loops appear nondistended. No evidence for bowel obstruction with air throughout the colon.   Electronically Signed   By: Staci Righter M.D.   On: 09/12/2015 08:11   Ct Abdomen Pelvis W Contrast  09/12/2015   CLINICAL DATA:  79 year old with mid abdominal pain and distention. Postop CABG 1 month ago. Initial encounter.  EXAM: CT ABDOMEN AND PELVIS WITH CONTRAST  TECHNIQUE: Multidetector CT imaging of the abdomen and pelvis was performed using the standard protocol following bolus administration of intravenous contrast.  CONTRAST:  192mL OMNIPAQUE IOHEXOL 300 MG/ML  SOLN  COMPARISON:  Noncontrast CT 08/16/2015.  FINDINGS: Lower chest: Small left pleural effusion appears improved. There is improved atelectasis at both lung bases. Minimal pericardial fluid is noted status post recent open heart surgery. The sternotomy appears intact.  Hepatobiliary: The liver is normal in density without focal abnormality. The gallbladder is distended, although demonstrates no wall thickening, surrounding inflammation or definite stones.  Pancreas: Atrophy without focal abnormality or surrounding inflammation.  Spleen: Normal in size without focal  abnormality.  Adrenals/Urinary Tract: Both adrenal glands appear normal. The kidneys appear normal without evidence of urinary tract calculus, suspicious lesion or hydronephrosis. No bladder abnormalities are seen.  Stomach/Bowel: No evidence of bowel wall thickening, distention or surrounding inflammatory change. Mild sigmoid diverticulosis. Probable previous appendectomy.  Vascular/Lymphatic: There are no enlarged abdominal or pelvic lymph nodes. Stable small lymph nodes in the porta hepatis, likely reactive. There is atherosclerosis of the aorta, its branches and the iliac arteries. There is stable mild dilatation of the right common iliac artery.  Reproductive: There are stable  dystrophic calcifications within the prostate gland.  Other: Stable postsurgical changes in the infrasternal anterior abdominal wall. No ascites.  Musculoskeletal: No acute abdominal findings demonstrated. There are degenerative changes throughout the lumbar spine and a grade 1 anterolisthesis at L5-S1 secondary to underlying bilateral L5 pars defects.  IMPRESSION: 1. No definite acute findings or explanation for the patient's symptoms. 2. The gallbladder is mildly distended without apparent wall thickening or surrounding inflammation. This could be further evaluated with ultrasound if the patient has right upper quadrant pain. 3. Improving aeration of both lung bases and improved left pleural effusion related to recent CABG. 4. Stable atherosclerosis.   Electronically Signed   By: Richardean Sale M.D.   On: 09/12/2015 10:25   US Abdomen Limited Ruq  09/12/2015   CLINICAL DATA:  Gallbladder distention, noted on recent CT examination  EXAM: US ABDOMEN LIMITED - RIGHT UPPER QUADRANT  COMPARISON:  CT abdomen and pelvis September 12, 2015  FINDINGS: Gallbladder:  Gallbladder is mildly distended. There is moderate sludge throughout the gallbladder. There are no echogenic foci which move and shadow as is expected with gallstones. No gallbladder  wall thickening or pericholecystic fluid. No sonographic Murphy sign noted.  Common bile duct:  Diameter: 6 mm. No intrahepatic or extrahepatic biliary duct dilatation.  Liver:  No focal lesion identified. Within normal limits in parenchymal echogenicity.  IMPRESSION: Gallbladder is mildly distended and contains moderate sludge. Gallbladder appearance otherwise unremarkable. Study otherwise unremarkable.   Electronically Signed   By: Lowella Grip III M.D.   On: 09/12/2015 12:22   I have personally reviewed and evaluated these  lab results as part of my medical decision-making.   EKG Interpretation   Date/Time:  Tuesday September 12 2015 06:42:44 EDT Ventricular Rate:  68 PR Interval:  164 QRS Duration: 103 QT Interval:  424 QTC Calculation: 451 R Axis:   70 Text Interpretation:  Sinus or ectopic atrial rhythm borderline ST  depression in V4-V5 Confirmed by Yeilin Zweber MD, Jaiana Sheffer [phone]) on 09/12/2015  7:08:40 AM     Medications  aspirin chewable tablet 324 mg (324 mg Oral Given 09/12/15 0735)  ondansetron (ZOFRAN) injection 4 mg (4 mg Intravenous Given 09/12/15 0737)  morphine 4 MG/ML injection 4 mg (4 mg Intravenous Given 09/12/15 0737)  sodium chloride 0.9 % bolus 1,000 mL (0 mLs Intravenous Stopped 09/12/15 1053)  iohexol (OMNIPAQUE) 300 MG/ML solution 100 mL (100 mLs Intravenous Contrast Given 09/12/15 0954)  HYDROmorphone (DILAUDID) injection 0.5 mg (0.5 mg Intravenous Given 09/12/15 1137)    MDM   Final diagnoses:  RUQ pain   mild AKI Gallbladder sludge constipation   79 year old male who is one month postoperative from CABG , located by ileus who presents with one day of upper abdominal pain and pressure radiating into his chest. Patient mildly uncomfortable but in no acute distress. O2 sat 94% on RA. Crackles in bases and mild tenderness of midepigastrium. Obtained above labs as well as CXR and KUB. EKG shows no significant changes from previous. Gave pt ASA 324mg  on arrival.  Later gave zofran and morphine, dilaudid.  Chest x-ray and KUB without acute findings. Because of the patient's ongoing pain and concern for postoperative complication such as bowel obstruction or ileus, obtained CT of the abdomen and pelvis which showed a mildly distended gallbladder. Obtained right upper quadrant ultrasound which showed mild distention of gallbladder with sludge but no evidence of cholecystitis. Lab work notable for creatinine of 1.25 which is mildly elevated from baseline. CMP and lipase  unremarkable otherwise. Gave the patient an IV fluid bolus.  On reexamination, the patient remains comfortable. He states that his pain is reproducible with right upper quadrant palpation and he had significant reproduction of pain during ultrasound. I feel that with negative serial troponins and reassuring EKG that patient's abdominal pain is unlikely to be cardiac in nature. Discussed ultrasound findings with the patient is signed and emphasized the importance of follow-up with a general surgeon. Also inform them of elevated creatinine and need for continued hydration at home. Reviewed supportive care instructions as well as treatment of constipation including Colace, MiraLAX, and senna as needed. Patient has follow-up appointments with cardiothoracic surgery and cardiology tomorrow. Return precautions reviewed and the patient and his son voiced understanding. Patient provided with pain medications and nausea medicines to use at home. Patient discharged in satisfactory condition.  Sharlett Iles, MD 09/12/15 [phone]

## 2015-09-12 NOTE — ED Notes (Signed)
Pt arrived from home by POV son at bedside.  C/O aching central chest pain starting around 2030 last night.  Pt had bypass a month ago September 2nd has been in rehab for 2 weeks.

## 2015-09-12 NOTE — ED Notes (Signed)
Patient transported to Ultrasound 

## 2015-09-12 NOTE — Progress Notes (Signed)
Cardiology Office Note   Date:  09/13/2015   ID:  Tyler Mejia, Tyler Mejia [birth year], MRN [id number]  PCP:  [name], MD  Cardiologist:  Dr. Rozann Lesches   Electrophysiologist:  n/a  Chief Complaint  Patient presents with  . Hospitalization Follow-up    s/p CABG  . Coronary Artery Disease     History of Present Illness: Tyler Mejia is a 78 y.o. male with a hx of CAD s/p DES to the RCA in 3/13, HTN, HL, GERD.  He was recently seen for increasing anginal symptoms. Myoview was low risk and medical Rx was advanced.  He continued to have exertional angina despite medical Rx and was referred for [facility].  This demonstrated 3 v CAD with severe LAD, Dx, LCx/OM disease.  RCA stent was patent.  He was seen by Dr. Prescott Gum and underwent CABG with L-LAD, S-Dx, S-OM1, S-OM2 (R LE vein harvesting).  Post op course notable for AFib and symptomatic ileus.  He converted to NSR on Amiodarone.  He required NG tube, antibiotics for presumed intra-abdominal infection and TPN.  Ileus resolved and he was DC to [facility].    Patient went to the ED yesterday with abdominal pain radiating into his chest.  CT demonstrated mildly distended GB and [place] confirmed distention of GB with associated sludge but no evidence of cholecystitis.    Returns for FU.  He is now at home. Chest is not that sore.  Breathing is stable. No orthopnea, PND, edema. No syncope or near syncope.  He has an occasional wheeze.     Studies/Reports Reviewed Today:  TEE 08/11/15 EF 55-60%, Echo contrast study showed no right-to-left atrial level shunt  Echo 08/10/15 Mod LVH, EF 50-55%, inf HK, Gr 1 DD  Carotid US 9/16 Bilateral ICA 1-39%  LHC 08/09/15 LAD:  Mid 90%, D1 ostial 80% LCx: dist 60%, OM1 95% RCA:  Mid 25%, dist stent patent EF: 55-65% Given the severity of his disease, particularly in the LAD, I think it is wise to admit the patient and complete revascularization while he is in the hospital. He would be at high risk of  large anterior MI if he was discharged before revascularization. .  Myoview 3/16 IMPRESSION: 1. No reversible ischemia or infarction. 2. Normal left ventricular wall motion. 3. Left ventricular ejection fraction 58% 4. Low-risk stress test findings*.   Past Medical History  Diagnosis Date  . GERD (gastroesophageal reflux disease)   . Essential hypertension   . Hx of adenomatous colonic polyps   . Nephrolithiasis   . NSTEMI (non-ST elevated myocardial infarction) [facility]) March 2013  . Obesity   . HLD (hyperlipidemia)     a. H/o multiple statin intolerances.  Marland Kitchen COPD (chronic obstructive pulmonary disease) ([place])   . CAD (coronary artery disease)     a. DES to the RCA March 2013 with residual dz rx medically. b. s/p CABGx4 in 08/2015.  . IBS (irritable bowel syndrome)   . Atrial fibrillation, transient ([place])     a. Post-op from CABG 08/2015.  . Collagen vascular disease [facility])     Past Surgical History  Procedure Laterality Date  . Appendectomy    . Esophagogastroduodenoscopy  2003    distal erosion/small hiatal hernia  . Colonoscopy  08/21/2004    Pedunculated polyp at the splenic flexure  . Colonoscopy  09/2009    Dr. Rourk--> Cecal and hepatic flexure polyps, tubular adenomas. Next colonoscopy October 2013  . Esophagogastroduodenoscopy  09/2009  Dr. Evalee Mutton ring, mild distal esophageal erosions, status post Maloney dilation, small hiatal hernia.  . Back surgery    . Colonoscopy N/A 05/07/2013    RMR: colonic polyp/colonic diverticulosis  . Left heart catheterization with coronary angiogram N/A 03/02/2012    Procedure: LEFT HEART CATHETERIZATION WITH CORONARY ANGIOGRAM;  Surgeon: Peter M Martinique, MD;  Location: [facility] C[facility] LAB;  Service: Cardiovascular;  Laterality: N/A;  . Percutaneous coronary stent intervention (pci-s) N/A 03/02/2012    Procedure: PERCUTANEOUS CORONARY STENT INTERVENTION (PCI-S);  Surgeon: Peter M Martinique, MD;  Location: [facility] CATH LAB;  Service:  Cardiovascular;  Laterality: N/A;  . Esophagogastroduodenoscopy N/A 03/16/2015    DDU:KGURK HH/focal area of distal esophageal s/p bx  . Cardiac catheterization N/A 08/09/2015    Procedure: Left Heart Cath and Coronary Angiography;  Surgeon: Jettie Booze, MD;  Location: [place] CV LAB;  Service: Cardiovascular;  Laterality: N/A;  . Coronary artery bypass graft N/A 08/11/2015    Procedure: CORONARY ARTERY BYPASS GRAFTING (CABG) X4 UTILIZING THE LEFT INTERNAL MAMMARY ARTERY TO LAD AND ENDOSCOPICALLY HARVESTED BILATERAL SAPHENEOUS VEINS TO DIAGONAL,OM1 AND OM2;  Surgeon: Ivin Poot, MD;  Location: [place];  Service: Open Heart Surgery;  Laterality: N/A;  . Tee without cardioversion N/A 08/11/2015    Procedure: TRANSESOPHAGEAL ECHOCARDIOGRAM (TEE);  Surgeon: Ivin Poot, MD;  Location: [place];  Service: Open Heart Surgery;  Laterality: N/A;     Current Outpatient Prescriptions  Medication Sig Dispense Refill  . acetaminophen (TYLENOL) 325 MG tablet Take 2 tablets (650 mg total) by mouth every 6 (six) hours as needed for mild pain, fever or headache.    Marland Kitchen aspirin EC 325 MG EC tablet Take 1 tablet (325 mg total) by mouth daily. 30 tablet 0  . dexlansoprazole (DEXILANT) 60 MG capsule Take 1 capsule (60 mg total) by mouth daily. 30 capsule 11  . docusate sodium (COLACE) 100 MG capsule Take 2 capsules (200 mg total) by mouth daily. 10 capsule 0  . HYDROcodone-acetaminophen (NORCO/VICODIN) 5-325 MG tablet Take 1-2 tablets by mouth every 6 (six) hours as needed for severe pain. 15 tablet 0  . metoprolol tartrate (LOPRESSOR) 25 MG tablet Take 0.5 tablets (12.5 mg total) by mouth 2 (two) times daily.    . ondansetron (ZOFRAN ODT) 4 MG disintegrating tablet Take 1 tablet (4 mg total) by mouth every 8 (eight) hours as needed for nausea or vomiting. 8 tablet 0   No current facility-administered medications for this visit.    Allergies:   Codeine    Social History:  The patient  reports that he  quit smoking about 13 years ago. His smoking use included Cigarettes. He has a 67.5 pack-year smoking history. He has never used smokeless tobacco. He reports that he does not drink alcohol or use illicit drugs.   Family History:  The patient's family history includes Breast cancer in his sister; Diabetes in his mother; Esophageal cancer in his brother; Heart disease in his mother; Pneumonia in his father. There is no history of Colon cancer.    ROS:   Please see the history of present illness.   Review of Systems  Constitution: Positive for chills and decreased appetite.  Cardiovascular: Positive for chest pain.  Respiratory: Positive for snoring and wheezing.   Hematologic/Lymphatic: Bruises/bleeds easily.  All other systems reviewed and are negative.     PHYSICAL EXAM: VS:  BP 102/58 mmHg  Pulse 68  Ht 5\' 9"  (1.753 m)  Wt 224 lb 6.4 oz (  101.787 kg)  BMI 33.12 kg/m2    Wt Readings from Last 3 Encounters:  09/13/15 224 lb 6.4 oz (101.787 kg)  09/13/15 225 lb (102.059 kg)  08/24/15 225 lb 15.5 oz (102.5 kg)     GEN: Well nourished, well developed, in no acute distress HEENT: normal Neck: no JVD,  no masses Cardiac:  Normal S1/S2, RRR; no murmur ,  no rubs or gallops, no edema   Respiratory:  clear to auscultation bilaterally, no wheezing, rhonchi or rales. GI: soft, mild RUQ tenderness, nondistended, + BS MS: no deformity or atrophy Skin: warm and dry  Neuro:  CNs II-XII intact, Strength and sensation are intact Psych: Normal affect   EKG:  EKG is ordered today.  It demonstrates:   NSR, HR 68, normal axis, nonspecific ST-T wave changes    Recent Labs: 08/19/2015: Magnesium 2.2 09/04/2015: TSH 3.464 09/12/2015: ALT 16*; BUN 16; Creatinine, Ser 1.25*; Hemoglobin 11.4*; Platelets 176; Potassium 4.3; Sodium 135    Lipid Panel    Component Value Date/Time   CHOL 138 08/11/2015 0430   TRIG 128 08/11/2015 0430   HDL 27* 08/11/2015 0430   CHOLHDL 5.1 08/11/2015 0430    VLDL 26 08/11/2015 0430   LDLCALC 85 08/11/2015 0430      ASSESSMENT AND PLAN:  1. CAD:  Hx of DES to the RCA and recent CABG for 3 v CAD.  He was DC to SNF for rehab.  Now back at home.  He is progressing slowly with poor appetite and recent trip to the ED with abdominal pain.  CEs were neg.    -  Continue ASA, beta-blocker.  He has been intol of statins.    -  He has been referred to Cardia Rehab  -  FU with Dr. Prescott Gum this AM  2. PAF:  He has post op AFib that converted to NSR with Amiodarone.  Dr. Prescott Gum stopped Amiodarone this AM b/c of abdominal issues. He has remained in NSR.  Hopefully appetite will improve off of Amiodarone.    3. HTN:  BP somewhat low.  Appetite is poor.  Recommend he try to eat as well as he can.  Try to add Ensure or Boost for dietary supplement.   4. Hyperlipidemia:  Intol to statins.  He had severe myalgias and hearing difficulty with statins in the past.  Could try Zetia or consider PCSK9 inhibitor.  With abdominal issues, would hold off on that right now.  Consider referral to Lipid Clinic at FU visit.   5. Biliary Colic:  Patient with RUQ pain and GB sludge on recent studies done yesterday in the ED.  ALT and Lipase were normal.  Would recommend FU with GI to manage.  He has seen Dr. Gala Romney in the past.  Will refer back.       Medication Changes: Current medicines are reviewed at length with the patient today.  Concerns regarding medicines are as outlined above.  The following changes have been made:   Discontinued Medications   AMIODARONE (PACERONE) 200 MG TABLET    Take 1 tablet (200 mg total) by mouth daily.   MECLIZINE (ANTIVERT) 25 MG TABLET    Take 25 mg by mouth 3 (three) times daily as needed for dizziness.   OXYBUTYNIN (DITROPAN-XL) 10 MG 24 HR TABLET    Take 1 tablet (10 mg total) by mouth at bedtime.   Modified Medications   No medications on file   New Prescriptions   No medications  on file   Labs/ tests ordered today include:     Orders Placed This Encounter  Procedures  . EKG 12-Lead     Disposition:    FU with Dr. Rozann Lesches 6 weeks.     Signed, Versie Starks, MHS 09/13/2015 4:34 PM    [facility] [place], [place], [place]  [address] Phone: [phone]; Fax: [phone]

## 2015-09-12 NOTE — Progress Notes (Signed)
This encounter was created in error - please disregard.

## 2015-09-13 ENCOUNTER — Encounter: Payer: Self-pay | Admitting: Physician Assistant

## 2015-09-13 ENCOUNTER — Encounter: Payer: Self-pay | Admitting: Internal Medicine

## 2015-09-13 ENCOUNTER — Encounter: Payer: Self-pay | Admitting: Cardiothoracic Surgery

## 2015-09-13 ENCOUNTER — Ambulatory Visit (INDEPENDENT_AMBULATORY_CARE_PROVIDER_SITE_OTHER): Payer: Self-pay | Admitting: Cardiothoracic Surgery

## 2015-09-13 ENCOUNTER — Ambulatory Visit
Admission: RE | Admit: 2015-09-13 | Discharge: 2015-09-13 | Disposition: A | Discharge status: Home / Self Care | Payer: Medicare Other | Source: Ambulatory Visit | Attending: Cardiothoracic Surgery | Admitting: Cardiothoracic Surgery

## 2015-09-13 ENCOUNTER — Encounter: Payer: Self-pay | Admitting: *Deleted

## 2015-09-13 ENCOUNTER — Ambulatory Visit (INDEPENDENT_AMBULATORY_CARE_PROVIDER_SITE_OTHER): Payer: Medicare Other | Admitting: Physician Assistant

## 2015-09-13 VITALS — BP 110/66 | HR 72 | Resp 20 | Ht 69.0 in | Wt 225.0 lb

## 2015-09-13 VITALS — BP 102/58 | HR 68 | Ht 69.0 in | Wt 224.4 lb

## 2015-09-13 DIAGNOSIS — Z951 Presence of aortocoronary bypass graft: Secondary | ICD-10-CM

## 2015-09-13 DIAGNOSIS — I480 Paroxysmal atrial fibrillation: Secondary | ICD-10-CM

## 2015-09-13 DIAGNOSIS — E785 Hyperlipidemia, unspecified: Secondary | ICD-10-CM

## 2015-09-13 DIAGNOSIS — K8050 Calculus of bile duct without cholangitis or cholecystitis without obstruction: Secondary | ICD-10-CM

## 2015-09-13 DIAGNOSIS — I2510 Atherosclerotic heart disease of native coronary artery without angina pectoris: Secondary | ICD-10-CM | POA: Diagnosis not present

## 2015-09-13 DIAGNOSIS — I10 Essential (primary) hypertension: Secondary | ICD-10-CM

## 2015-09-13 NOTE — Patient Instructions (Signed)
Medication Instructions:  Your physician recommends that you continue on your current medications as directed. Please refer to the Current Medication list given to you today.   Labwork: NONE  Testing/Procedures: NONE  Follow-Up: 1. DR. MCDOWELL 6 WEEKS   2. YOU WILL NEED TO FOLLOW UP DR. ROURK IN 2 WEEKS; DX BILIARY COLIC ; PHONE [phone]   Any Other Special Instructions Will Be Listed Below (If Applicable).  TRY SOME ENSURE OR BOOST

## 2015-09-13 NOTE — Progress Notes (Signed)
PCP is [name], MD Referring Provider is Jettie Booze, MD  Chief Complaint  Patient presents with  . Routine Post Op    f/u from surgery with CXR, s/p CABG x 4 08/11/15    HPI: One month after multivessel CABG for unstable angina He is doing well from a cardiac standpoint but still is having significant abdominal complaints and was evaluated with a CT of the abdomen recently which was nonspecific other than some mild gallbladder thickening. He denies shortness of breath or angina. The sternal incision is well-healed. There is no peripheral edema. He is maintained sinus rhythm on amiodarone. Because of the GI complaints we will stop the amiodarone. The patient had a significant postoperative ileus following CABG which has resolved. Chest x-ray today shows clear lung fields no significant pleural effusion. The patient was encouraged to start outpatient cardiac rehabilitation.  Past Medical History  Diagnosis Date  . GERD (gastroesophageal reflux disease)   . Essential hypertension   . Hx of adenomatous colonic polyps   . Nephrolithiasis   . NSTEMI (non-ST elevated myocardial infarction) [facility]) March 2013  . Obesity   . HLD (hyperlipidemia)     a. H/o multiple statin intolerances.  Marland Kitchen COPD (chronic obstructive pulmonary disease) ([place])   . CAD (coronary artery disease)     a. DES to the RCA March 2013 with residual dz rx medically. b. s/p CABGx4 in 08/2015.  . IBS (irritable bowel syndrome)   . Atrial fibrillation, transient ([place])     a. Post-op from CABG 08/2015.  . Collagen vascular disease [facility])     Past Surgical History  Procedure Laterality Date  . Appendectomy    . Esophagogastroduodenoscopy  2003    distal erosion/small hiatal hernia  . Colonoscopy  08/21/2004    Pedunculated polyp at the splenic flexure  . Colonoscopy  09/2009    Dr. Rourk--> Cecal and hepatic flexure polyps, tubular adenomas. Next colonoscopy October 2013  . Esophagogastroduodenoscopy  09/2009     Dr. Evalee Mutton ring, mild distal esophageal erosions, status post Maloney dilation, small hiatal hernia.  . Back surgery    . Colonoscopy N/A 05/07/2013    RMR: colonic polyp/colonic diverticulosis  . Left heart catheterization with coronary angiogram N/A 03/02/2012    Procedure: LEFT HEART CATHETERIZATION WITH CORONARY ANGIOGRAM;  Surgeon: Peter M Martinique, MD;  Location: [facility] CATH LAB;  Service: Cardiovascular;  Laterality: N/A;  . Percutaneous coronary stent intervention (pci-s) N/A 03/02/2012    Procedure: PERCUTANEOUS CORONARY STENT INTERVENTION (PCI-S);  Surgeon: Peter M Martinique, MD;  Location: [facility] CATH LAB;  Service: Cardiovascular;  Laterality: N/A;  . Esophagogastroduodenoscopy N/A 03/16/2015    RCV:ELFYB HH/focal area of distal esophageal s/p bx  . Cardiac catheterization N/A 08/09/2015    Procedure: Left Heart Cath and Coronary Angiography;  Surgeon: Jettie Booze, MD;  Location: [place] CV LAB;  Service: Cardiovascular;  Laterality: N/A;  . Coronary artery bypass graft N/A 08/11/2015    Procedure: CORONARY ARTERY BYPASS GRAFTING (CABG) X4 UTILIZING THE LEFT INTERNAL MAMMARY ARTERY TO LAD AND ENDOSCOPICALLY HARVESTED BILATERAL SAPHENEOUS VEINS TO DIAGONAL,OM1 AND OM2;  Surgeon: Ivin Poot, MD;  Location: [place];  Service: Open Heart Surgery;  Laterality: N/A;  . Tee without cardioversion N/A 08/11/2015    Procedure: TRANSESOPHAGEAL ECHOCARDIOGRAM (TEE);  Surgeon: Ivin Poot, MD;  Location: [place];  Service: Open Heart Surgery;  Laterality: N/A;    Family History  Problem Relation Age of Onset  . Pneumonia Father   .  Heart disease Mother   . Breast cancer Sister     2 sisters  . Esophageal cancer Brother   . Diabetes Mother   . Colon cancer Neg Hx     Social History Social History  Substance Use Topics  . Smoking status: Former Smoker -- 1.50 packs/day for 45 years    Types: Cigarettes    Quit date: 12/09/2001  . Smokeless tobacco: Never Used     Comment: quit  about 9 years ago  . Alcohol Use: No     Comment: Very rarely    Current Outpatient Prescriptions  Medication Sig Dispense Refill  . acetaminophen (TYLENOL) 325 MG tablet Take 2 tablets (650 mg total) by mouth every 6 (six) hours as needed for mild pain, fever or headache.    Marland Kitchen amiodarone (PACERONE) 200 MG tablet Take 1 tablet (200 mg total) by mouth daily.    Marland Kitchen aspirin EC 325 MG EC tablet Take 1 tablet (325 mg total) by mouth daily. 30 tablet 0  . dexlansoprazole (DEXILANT) 60 MG capsule Take 1 capsule (60 mg total) by mouth daily. 30 capsule 11  . docusate sodium (COLACE) 100 MG capsule Take 2 capsules (200 mg total) by mouth daily. 10 capsule 0  . HYDROcodone-acetaminophen (NORCO/VICODIN) 5-325 MG tablet Take 1-2 tablets by mouth every 6 (six) hours as needed for severe pain. 15 tablet 0  . meclizine (ANTIVERT) 25 MG tablet Take 25 mg by mouth 3 (three) times daily as needed for dizziness.    . metoprolol tartrate (LOPRESSOR) 25 MG tablet Take 0.5 tablets (12.5 mg total) by mouth 2 (two) times daily.    . ondansetron (ZOFRAN ODT) 4 MG disintegrating tablet Take 1 tablet (4 mg total) by mouth every 8 (eight) hours as needed for nausea or vomiting. 8 tablet 0  . oxybutynin (DITROPAN-XL) 10 MG 24 hr tablet Take 1 tablet (10 mg total) by mouth at bedtime.     No current facility-administered medications for this visit.    Allergies  Allergen Reactions  . Codeine Other (See Comments)    Pt does not know     Review of Systems  He has lost approximately 20 pounds since surgery. He actually looks much better now. No fever or drainage from the incisions. No problems with edema. He understands he needs to walk more. He is a nonsmoker.  BP 110/66 mmHg  Pulse 72  Resp 20  Ht 5\' 9"  (1.753 m)  Wt 225 lb (102.059 kg)  BMI 33.21 kg/m2  SpO2 94% Physical Exam Alert and comfortable Lungs clear Sternum stable and well-healed Heart rate regular without gallop or murmur Abdomen  soft Extremities without edema  Diagnostic Tests: Chest x-ray clear  Impression: Postop GI problems-we'll stop amiodarone as he is maintained sinus rhythm and this template his GI side effects. We will refer him to outpatient cardiac rehabilitation he can start driving and left up to 20 pounds. He knows not lift more than 20 pounds until 3 months after surgery.  Plan: Return as needed  Len Childs, MD [facility] [phone]

## 2015-09-13 NOTE — Progress Notes (Signed)
Patient ID: Tyler Mejia, male   DOB: Apr 07, 1936, 79 y.o.   MRN: [id number] Mr. Tyler Mejia has been referred to [facility] Phase II per Dr. Lucianne Lei Trigt's request. Their referral was signed and faxed.

## 2015-09-29 ENCOUNTER — Ambulatory Visit: Payer: Medicare Other | Admitting: Gastroenterology

## 2015-10-25 ENCOUNTER — Encounter: Payer: Self-pay | Admitting: Cardiology

## 2015-10-25 ENCOUNTER — Ambulatory Visit (INDEPENDENT_AMBULATORY_CARE_PROVIDER_SITE_OTHER): Payer: Medicare Other | Admitting: Cardiology

## 2015-10-25 VITALS — BP 140/80 | HR 68 | Ht 69.0 in | Wt 226.0 lb

## 2015-10-25 DIAGNOSIS — E785 Hyperlipidemia, unspecified: Secondary | ICD-10-CM

## 2015-10-25 DIAGNOSIS — I10 Essential (primary) hypertension: Secondary | ICD-10-CM | POA: Diagnosis not present

## 2015-10-25 DIAGNOSIS — I2510 Atherosclerotic heart disease of native coronary artery without angina pectoris: Secondary | ICD-10-CM | POA: Diagnosis not present

## 2015-10-25 DIAGNOSIS — Z951 Presence of aortocoronary bypass graft: Secondary | ICD-10-CM | POA: Diagnosis not present

## 2015-10-25 MED ORDER — EZETIMIBE 10 MG PO TABS: 10.0000 mg | ORAL_TABLET | Freq: Every day | ORAL | Status: DC

## 2015-10-25 NOTE — Patient Instructions (Addendum)
Medication Instructions:  START ZETIA 10 mg daily  Labwork: NONE  Testing/Procedures: WE PUT ANOTHER REFERRAL IN TO CARDIAC REHAB- THEY WILL CONTACT YOU   Follow-Up: Your physician recommends that you schedule a follow-up appointment in: 3 MONTHS WITH DR. MCDOWELL    Any Other Special Instructions Will Be Listed Below (If Applicable).     If you need a refill on your cardiac medications before your next appointment, please call your pharmacy.  Thanks for choosing [facility]!!!

## 2015-10-25 NOTE — Progress Notes (Signed)
Cardiology Office Note  Date: 10/25/2015   ID: Tyler Mejia, Tyler Mejia August 20, 1936, MRN [id number]  PCP: [name], MD  Primary Cardiologist: Rozann Lesches, MD   Chief Complaint  Patient presents with  . Coronary Artery Disease    History of Present Illness: Tyler Mejia is a 79 y.o. male last seen in August of this year. Subsequent records reviewed, he was referred for a cardiac catheterization which revealed multivessel CAD, evaluated for CABG by Dr. Prescott Gum and underwent surgery including LIMA to LAD, SVG to diagonal, SVG to OM1, and SVG to OM 2 in September. Postoperative course was notable for atrial fibrillation and ileus. He was managed with amiodarone. He convalesced in a nursing facility after discharge, has been back at home since October. He has had office follow-up with Mr. Jorene Minors and also Dr. Prescott Gum in October.  He presents today for a routine follow-up visit. Although initially having some difficulties with nausea and poor appetite, this has improved significantly. We discussed his medications which are outlined below. He does have a history of significant statin intolerance. We talked about trying Zetia, his last LDL was 85. He has not started cardiac rehabilitation, offices verifying that he does have a referral.   Past Medical History  Diagnosis Date  . GERD (gastroesophageal reflux disease)   . Essential hypertension   . Hx of adenomatous colonic polyps   . Nephrolithiasis   . NSTEMI (non-ST elevated myocardial infarction) [facility]) March 2013  . Obesity   . HLD (hyperlipidemia)     a. H/o multiple statin intolerances.  Marland Kitchen COPD (chronic obstructive pulmonary disease) ([place])   . CAD (coronary artery disease)     a. DES to the RCA March 2013 with residual dz rx medically. b. s/p CABGx4 in 08/2015.  . IBS (irritable bowel syndrome)   . Atrial fibrillation, transient ([place])     a. Post-op from CABG 08/2015.  . Collagen vascular disease ([place])     Current  Outpatient Prescriptions  Medication Sig Dispense Refill  . acetaminophen (TYLENOL) 325 MG tablet Take 2 tablets (650 mg total) by mouth every 6 (six) hours as needed for mild pain, fever or headache.    Marland Kitchen aspirin EC 325 MG EC tablet Take 1 tablet (325 mg total) by mouth daily. 30 tablet 0  . docusate sodium (COLACE) 100 MG capsule Take 2 capsules (200 mg total) by mouth daily. 10 capsule 0  . metoprolol tartrate (LOPRESSOR) 25 MG tablet Take 0.5 tablets (12.5 mg total) by mouth 2 (two) times daily.    . pantoprazole (PROTONIX) 40 MG tablet   11   No current facility-administered medications for this visit.    Allergies:  Codeine   Social History: The patient  reports that he quit smoking about 13 years ago. His smoking use included Cigarettes. He has a 67.5 pack-year smoking history. He has never used smokeless tobacco. He reports that he does not drink alcohol or use illicit drugs.   ROS:  Please see the history of present illness. Otherwise, complete review of systems is positive for occasional headaches.  All other systems are reviewed and negative.   Physical Exam: VS:  BP 140/80 mmHg  Pulse 68  Ht 5\' 9"  (1.753 m)  Wt 226 lb (102.513 kg)  BMI 33.36 kg/m2  SpO2 94%, BMI Body mass index is 33.36 kg/(m^2).  Wt Readings from Last 3 Encounters:  10/25/15 226 lb (102.513 kg)  09/13/15 224 lb 6.4 oz (101.787 kg)  09/13/15 225 lb (102.059 kg)     General: Patient appears comfortable at rest. HEENT: Conjunctiva and lids normal, oropharynx clear. Neck: Supple, no elevated JVP or carotid bruits, no thyromegaly. Lungs: Clear to auscultation, nonlabored breathing at rest. Thorax: Well-healed sternal incision. Cardiac: Regular rate and rhythm, no S3 or significant systolic murmur, no pericardial rub. Abdomen: Soft, nontender, bowel sounds present. Extremities: No pitting edema, distal pulses 2+.   ECG: Tracing from 09/14/2015 showed sinus rhythm with nonspecific T-wave  changes.  Recent Labwork: 08/19/2015: Magnesium 2.2 09/04/2015: TSH 3.464 09/12/2015: ALT 16*; AST 22; BUN 16; Creatinine, Ser 1.25*; Hemoglobin 11.4*; Platelets 176; Potassium 4.3; Sodium 135     Component Value Date/Time   CHOL 138 08/11/2015 0430   TRIG 128 08/11/2015 0430   HDL 27* 08/11/2015 0430   CHOLHDL 5.1 08/11/2015 0430   VLDL 26 08/11/2015 0430   LDLCALC 85 08/11/2015 0430    Other Studies Reviewed Today:  Echocardiogram 08/10/2015: Study Conclusions  - Left ventricle: The cavity size was normal. There was moderate concentric hypertrophy. Systolic function was normal. The estimated ejection fraction was in the range of 50% to 55%. There is hypokinesis of the basalinferior myocardium. Doppler parameters are consistent with abnormal left ventricular relaxation (grade 1 diastolic dysfunction).  Impressions:  - EF is improved when compared to prior echocardiogram.  ASSESSMENT AND PLAN:  1. Multivessel CAD status post CABG in September. We are checking on referral to cardiac rehabilitation, he should be able to proceed with this. Plan to continue medical therapy and arrange follow-up in 3 months.  2. History of hyperlipidemia and statin intolerance. We will try Zetia 10 mg daily.  3. Essential hypertension, no changes made to current regimen. Keep an eye on blood pressure trend. Might consider adding low-dose ARB.  Current medicines were reviewed at length with the patient today.  Disposition: FU with me in 3 months.   Signed, [name], MD, [facility] 10/25/2015 3:08 PM    [place] [facility] [address], [place], [place] [address] Phone: [phone]; Fax: [phone]

## 2015-12-21 ENCOUNTER — Encounter (HOSPITAL_COMMUNITY): Payer: Self-pay

## 2015-12-21 ENCOUNTER — Inpatient Hospital Stay (HOSPITAL_COMMUNITY): Payer: Medicare Other

## 2015-12-21 ENCOUNTER — Emergency Department (HOSPITAL_COMMUNITY): Payer: Medicare Other

## 2015-12-21 ENCOUNTER — Inpatient Hospital Stay (HOSPITAL_COMMUNITY)
Admission: EM | Admit: 2015-12-21 | Discharge: 2015-12-26 | DRG: 917 | Disposition: A | Discharge status: Other Acute Inpatient Hospital | Payer: Medicare Other | Attending: Internal Medicine | Admitting: Internal Medicine

## 2015-12-21 DIAGNOSIS — Z800 Family history of malignant neoplasm of digestive organs: Secondary | ICD-10-CM

## 2015-12-21 DIAGNOSIS — Z8249 Family history of ischemic heart disease and other diseases of the circulatory system: Secondary | ICD-10-CM

## 2015-12-21 DIAGNOSIS — J449 Chronic obstructive pulmonary disease, unspecified: Secondary | ICD-10-CM | POA: Diagnosis present

## 2015-12-21 DIAGNOSIS — I10 Essential (primary) hypertension: Secondary | ICD-10-CM | POA: Diagnosis present

## 2015-12-21 DIAGNOSIS — R4182 Altered mental status, unspecified: Secondary | ICD-10-CM | POA: Diagnosis present

## 2015-12-21 DIAGNOSIS — G9340 Encephalopathy, unspecified: Secondary | ICD-10-CM | POA: Diagnosis present

## 2015-12-21 DIAGNOSIS — D72829 Elevated white blood cell count, unspecified: Secondary | ICD-10-CM | POA: Diagnosis present

## 2015-12-21 DIAGNOSIS — E785 Hyperlipidemia, unspecified: Secondary | ICD-10-CM

## 2015-12-21 DIAGNOSIS — I2510 Atherosclerotic heart disease of native coronary artery without angina pectoris: Secondary | ICD-10-CM | POA: Diagnosis present

## 2015-12-21 DIAGNOSIS — Z951 Presence of aortocoronary bypass graft: Secondary | ICD-10-CM

## 2015-12-21 DIAGNOSIS — T5891XA Toxic effect of carbon monoxide from unspecified source, accidental (unintentional), initial encounter: Secondary | ICD-10-CM | POA: Diagnosis present

## 2015-12-21 DIAGNOSIS — R410 Disorientation, unspecified: Secondary | ICD-10-CM

## 2015-12-21 DIAGNOSIS — Z833 Family history of diabetes mellitus: Secondary | ICD-10-CM | POA: Diagnosis not present

## 2015-12-21 DIAGNOSIS — I639 Cerebral infarction, unspecified: Secondary | ICD-10-CM | POA: Diagnosis present

## 2015-12-21 DIAGNOSIS — Z87891 Personal history of nicotine dependence: Secondary | ICD-10-CM | POA: Diagnosis not present

## 2015-12-21 DIAGNOSIS — K219 Gastro-esophageal reflux disease without esophagitis: Secondary | ICD-10-CM | POA: Diagnosis present

## 2015-12-21 DIAGNOSIS — I252 Old myocardial infarction: Secondary | ICD-10-CM

## 2015-12-21 DIAGNOSIS — R32 Unspecified urinary incontinence: Secondary | ICD-10-CM | POA: Diagnosis present

## 2015-12-21 DIAGNOSIS — Z803 Family history of malignant neoplasm of breast: Secondary | ICD-10-CM | POA: Diagnosis not present

## 2015-12-21 DIAGNOSIS — A8100 Creutzfeldt-Jakob disease, unspecified: Secondary | ICD-10-CM | POA: Diagnosis present

## 2015-12-21 DIAGNOSIS — Z955 Presence of coronary angioplasty implant and graft: Secondary | ICD-10-CM | POA: Diagnosis not present

## 2015-12-21 LAB — COMPREHENSIVE METABOLIC PANEL
ALBUMIN: 4.1 g/dL (ref 3.5–5.0)
ALK PHOS: 73 U/L (ref 38–126)
ALT: 14 U/L — AB (ref 17–63)
ANION GAP: 9 (ref 5–15)
AST: 23 U/L (ref 15–41)
BILIRUBIN TOTAL: 1.5 mg/dL — AB (ref 0.3–1.2)
BUN: 14 mg/dL (ref 6–20)
CALCIUM: 9.4 mg/dL (ref 8.9–10.3)
CO2: 28 mmol/L (ref 22–32)
CREATININE: 1.11 mg/dL (ref 0.61–1.24)
Chloride: 104 mmol/L (ref 101–111)
GFR calc Af Amer: >60 mL/min (ref >60)
GFR calc non Af Amer: >60 mL/min (ref >60)
GLUCOSE: 95 mg/dL (ref 65–99)
Potassium: 3.9 mmol/L (ref 3.5–5.1)
Sodium: 141 mmol/L (ref 135–145)
TOTAL PROTEIN: 7.5 g/dL (ref 6.5–8.1)

## 2015-12-21 LAB — URINALYSIS, ROUTINE W REFLEX MICROSCOPIC
BILIRUBIN URINE: NEGATIVE
Glucose, UA: NEGATIVE mg/dL
HGB URINE DIPSTICK: NEGATIVE
KETONES UR: NEGATIVE mg/dL
Leukocytes, UA: NEGATIVE
Nitrite: NEGATIVE
PROTEIN: 100 mg/dL — AB
SPECIFIC GRAVITY, URINE: 1.02 (ref 1.005–1.030)
pH: 6.5 (ref 5.0–8.0)

## 2015-12-21 LAB — CBC
HCT: 45.6 % (ref 39.0–52.0)
HEMOGLOBIN: 14.4 g/dL (ref 13.0–17.0)
MCH: 25.9 pg — AB (ref 26.0–34.0)
MCHC: 31.6 g/dL (ref 30.0–36.0)
MCV: 82 fL (ref 78.0–100.0)
PLATELETS: 174 10*3/uL (ref 150–400)
RBC: 5.56 MIL/uL (ref 4.22–5.81)
RDW: 14.5 % (ref 11.5–15.5)
WBC: 7.6 10*3/uL (ref 4.0–10.5)

## 2015-12-21 LAB — TROPONIN I

## 2015-12-21 LAB — URINE MICROSCOPIC-ADD ON: RBC / HPF: NONE SEEN RBC/hpf (ref 0–5)

## 2015-12-21 LAB — LIPASE, BLOOD: LIPASE: 30 U/L (ref 11–51)

## 2015-12-21 MED ORDER — ENOXAPARIN SODIUM 40 MG/0.4ML ~~LOC~~ SOLN
40.0000 mg | SUBCUTANEOUS | Status: DC
  Administered 2015-12-21 – 2015-12-22 (×2): 40 mg via SUBCUTANEOUS
  Filled 2015-12-21 (×2): qty 0.4

## 2015-12-21 MED ORDER — ACETAMINOPHEN 325 MG PO TABS
650.0000 mg | ORAL_TABLET | ORAL | Status: DC | PRN
  Administered 2015-12-23: 650 mg via ORAL
  Filled 2015-12-21: qty 2

## 2015-12-21 MED ORDER — SODIUM CHLORIDE 0.9 % IV SOLN
INTRAVENOUS | Status: DC
  Administered 2015-12-21: 19:00:00 via INTRAVENOUS

## 2015-12-21 MED ORDER — SODIUM CHLORIDE 0.9 % IV BOLUS (SEPSIS)
250.0000 mL | Freq: Once | INTRAVENOUS | Status: AC
  Administered 2015-12-21: 250 mL via INTRAVENOUS

## 2015-12-21 MED ORDER — ACETAMINOPHEN 650 MG RE SUPP: 650.0000 mg | RECTAL | Status: DC | PRN

## 2015-12-21 MED ORDER — STROKE: EARLY STAGES OF RECOVERY BOOK
Freq: Once | Status: DC
  Filled 2015-12-21: qty 1

## 2015-12-21 MED ORDER — SODIUM CHLORIDE 0.9 % IV SOLN
INTRAVENOUS | Status: DC
  Administered 2015-12-21: 12:00:00 via INTRAVENOUS

## 2015-12-21 MED ORDER — METOPROLOL TARTRATE 25 MG PO TABS
12.5000 mg | ORAL_TABLET | Freq: Two times a day (BID) | ORAL | Status: DC
  Administered 2015-12-21 – 2015-12-26 (×10): 12.5 mg via ORAL
  Filled 2015-12-21 (×10): qty 1

## 2015-12-21 MED ORDER — SENNOSIDES-DOCUSATE SODIUM 8.6-50 MG PO TABS: 1.0000 | ORAL_TABLET | Freq: Every evening | ORAL | Status: DC | PRN

## 2015-12-21 MED ORDER — ASPIRIN EC 325 MG PO TBEC
325.0000 mg | DELAYED_RELEASE_TABLET | Freq: Every day | ORAL | Status: DC
  Administered 2015-12-21 – 2015-12-23 (×3): 325 mg via ORAL
  Filled 2015-12-21 (×3): qty 1

## 2015-12-21 MED ORDER — DOCUSATE SODIUM 100 MG PO CAPS
200.0000 mg | ORAL_CAPSULE | Freq: Every day | ORAL | Status: DC
  Administered 2015-12-23: 200 mg via ORAL
  Filled 2015-12-21 (×5): qty 2

## 2015-12-21 NOTE — ED Notes (Signed)
Son reports pt had a cold last week and saw Dr. Willey Blade.  Reports unsure what Dr. Willey Blade gave him but has noticed pt has been confused.  Reports has been incontinent and not realizing it.  Reports bazaar behavior.  Son says pt has had similar symptoms in the past on certain medications.  Son unsure if Dr. Willey Blade prescribed him anything new.

## 2015-12-21 NOTE — ED Provider Notes (Signed)
CSN: [id number]     Arrival date & time 12/21/15  1006 History  By signing my name below, I, Tyler Mejia, attest that this documentation has been prepared under the direction and in the presence of Tyler Sorrow, MD. Electronically Signed: Terressa Mejia, ED Scribe. 12/21/2015. 11:11 AM.   LEVEL 5 CAVEAT: AMS  Chief Complaint  Patient presents with  . Altered Mental Status   The history is provided by a relative. No language interpreter was used.   PCP:: Tyler Noble, MD  Cardiologist: Tyler Sark, MD HPI Comments: Tyler Mejia is a 80 y.o. male, accompanied by his family, who lives alone, with PMHx noted below, who presents to the Emergency Department complaining of subacute, intermittent confusion onset last week. Pt's son suspects pt's Sx began after pt's PCP started him on a new med-- the identity of the med is unknown by pt's son. Pt's son reports pt is unable to recall street names, cannot differentiate between his cell phone and TV remote, and is not responding to door bell/phone calls.  Associated Sx include multiple episodes of fecal incontinence onset couple of days ago. Pt's son reports a Hx of similar Sx after pt was started on certain meds after a stent placement a few years ago. Pt's son denies cough, vomiting, or fever.   Past Medical History  Diagnosis Date  . GERD (gastroesophageal reflux disease)   . Essential hypertension   . Hx of adenomatous colonic polyps   . Nephrolithiasis   . NSTEMI (non-ST elevated myocardial infarction) [facility]) March 2013  . Obesity   . HLD (hyperlipidemia)     a. H/o multiple statin intolerances.  Marland Kitchen COPD (chronic obstructive pulmonary disease) ([place])   . CAD (coronary artery disease)     a. DES to the RCA March 2013 with residual dz rx medically. b. s/p CABGx4 in 08/2015.  . IBS (irritable bowel syndrome)   . Atrial fibrillation, transient ([place])     a. Post-op from CABG 08/2015.  . Collagen vascular disease [facility])    Past Surgical  History  Procedure Laterality Date  . Appendectomy    . Esophagogastroduodenoscopy  2003    distal erosion/small hiatal hernia  . Colonoscopy  08/21/2004    Pedunculated polyp at the splenic flexure  . Colonoscopy  09/2009    Dr. Rourk--> Cecal and hepatic flexure polyps, tubular adenomas. Next colonoscopy October 2013  . Esophagogastroduodenoscopy  09/2009    Dr. Evalee Mutton ring, mild distal esophageal erosions, status post Maloney dilation, small hiatal hernia.  . Back surgery    . Colonoscopy N/A 05/07/2013    RMR: colonic polyp/colonic diverticulosis  . Left heart catheterization with coronary angiogram N/A 03/02/2012    Procedure: LEFT HEART CATHETERIZATION WITH CORONARY ANGIOGRAM;  Surgeon: Peter M Martinique, MD;  Location: [facility] CATH LAB;  Service: Cardiovascular;  Laterality: N/A;  . Percutaneous coronary stent intervention (pci-s) N/A 03/02/2012    Procedure: PERCUTANEOUS CORONARY STENT INTERVENTION (PCI-S);  Surgeon: Peter M Martinique, MD;  Location: [facility] CATH LAB;  Service: Cardiovascular;  Laterality: N/A;  . Esophagogastroduodenoscopy N/A 03/16/2015    [id number] HH/focal area of distal esophageal s/p bx  . Cardiac catheterization N/A 08/09/2015    Procedure: Left Heart Cath and Coronary Angiography;  Surgeon: Tyler Booze, MD;  Location: [place] CV LAB;  Service: Cardiovascular;  Laterality: N/A;  . Coronary artery bypass graft N/A 08/11/2015    Procedure: CORONARY ARTERY BYPASS GRAFTING (CABG) X4 UTILIZING THE LEFT INTERNAL MAMMARY ARTERY TO LAD AND ENDOSCOPICALLY  HARVESTED BILATERAL SAPHENEOUS VEINS TO DIAGONAL,OM1 AND OM2;  Surgeon: Tyler Poot, MD;  Location: [place];  Service: Open Heart Surgery;  Laterality: N/A;  . Tee without cardioversion N/A 08/11/2015    Procedure: TRANSESOPHAGEAL ECHOCARDIOGRAM (TEE);  Surgeon: Tyler Poot, MD;  Location: [place];  Service: Open Heart Surgery;  Laterality: N/A;   Family History  Problem Relation Age of Onset  . Pneumonia Father    . Heart disease Mother   . Breast cancer Sister     2 sisters  . Esophageal cancer Brother   . Diabetes Mother   . Colon cancer Neg Hx    Social History  Substance Use Topics  . Smoking status: Former Smoker -- 1.50 packs/day for 45 years    Types: Cigarettes    Quit date: 12/09/2001  . Smokeless tobacco: Never Used     Comment: quit about 9 years ago  . Alcohol Use: No     Comment: Very rarely    Review of Systems  Unable to perform ROS: Mental status change      Allergies  Codeine  Home Medications   Prior to Admission medications   Medication Sig Start Date End Date Taking? Authorizing Provider  acetaminophen (TYLENOL) 325 MG tablet Take 2 tablets (650 mg total) by mouth every 6 (six) hours as needed for mild pain, fever or headache. 08/24/15  Yes Erin R Barrett, PA-C  aspirin EC 325 MG EC tablet Take 1 tablet (325 mg total) by mouth daily. 08/24/15  Yes Erin R Barrett, PA-C  docusate sodium (COLACE) 100 MG capsule Take 2 capsules (200 mg total) by mouth daily. 08/24/15  Yes Erin R Barrett, PA-C  metoprolol tartrate (LOPRESSOR) 25 MG tablet Take 0.5 tablets (12.5 mg total) by mouth 2 (two) times daily. 08/24/15  Yes Erin R Barrett, PA-C  pantoprazole (PROTONIX) 40 MG tablet Take 40 mg by mouth daily.  10/04/15  Yes Historical Provider, MD  sildenafil (REVATIO) 20 MG tablet Take 60-100 mg by mouth daily as needed.   Yes Historical Provider, MD  ezetimibe (ZETIA) 10 MG tablet Take 1 tablet (10 mg total) by mouth daily. Patient not taking: Reported on 12/21/2015 10/25/15   Tyler Sark, MD   Triage Vitals: BP 154/81 mmHg  Pulse 62  Temp(Src) 97.9 F (36.6 C) (Oral)  Resp 18  Ht 5\' 9"  (1.753 m)  Wt 210 lb (95.255 kg)  BMI 31.00 kg/m2  SpO2 97% Physical Exam  Constitutional: He appears well-developed and well-nourished.  HENT:  Head: Normocephalic.  Mouth/Throat: Mucous membranes are normal.  Eyes: EOM are normal. Pupils are equal, round, and reactive to light.  No scleral icterus.  Neck: Normal range of motion.  Cardiovascular: Normal rate and regular rhythm.   Pulmonary/Chest: Effort normal and breath sounds normal. No respiratory distress.  Abdominal: Bowel sounds are normal. He exhibits no distension. There is no tenderness.  Musculoskeletal: Normal range of motion. He exhibits edema (trace edema BLE).  Neurological: He is alert.  Alert and responds to verbal commands   Nursing note and vitals reviewed.   ED Course  Procedures (including critical care time) DIAGNOSTIC STUDIES: Oxygen Saturation is 97% on ra, nl by my interpretation.    COORDINATION OF CARE: 11:08 AM: Discussed treatment plan which includes full workup, f/u with PCP regarding meds, with pt's family at bedside; family verbalizes understanding and agrees with treatment plan.  Labs Review Labs Reviewed  COMPREHENSIVE METABOLIC PANEL - Abnormal; Notable for the following:  ALT 14 (*)    Total Bilirubin 1.5 (*)    All other components within normal limits  CBC - Abnormal; Notable for the following:    MCH 25.9 (*)    All other components within normal limits  LIPASE, BLOOD  TROPONIN I  URINALYSIS, ROUTINE W REFLEX MICROSCOPIC (NOT AT [facility])   Results for orders placed or performed during the hospital encounter of 12/21/15  Comprehensive metabolic panel  Result Value Ref Range   Sodium 141 135 - 145 mmol/L   Potassium 3.9 3.5 - 5.1 mmol/L   Chloride 104 101 - 111 mmol/L   CO2 28 22 - 32 mmol/L   Glucose, Bld 95 65 - 99 mg/dL   BUN 14 6 - 20 mg/dL   Creatinine, Ser 1.11 0.61 - 1.24 mg/dL   Calcium 9.4 8.9 - 10.3 mg/dL   Total Protein 7.5 6.5 - 8.1 g/dL   Albumin 4.1 3.5 - 5.0 g/dL   AST 23 15 - 41 U/L   ALT 14 (L) 17 - 63 U/L   Alkaline Phosphatase 73 38 - 126 U/L   Total Bilirubin 1.5 (H) 0.3 - 1.2 mg/dL   GFR calc non Af Amer >60 >60 mL/min   GFR calc Af Amer >60 >60 mL/min   Anion gap 9 5 - 15  CBC  Result Value Ref Range   WBC 7.6 4.0 - 10.5 K/uL   RBC  5.56 4.22 - 5.81 MIL/uL   Hemoglobin 14.4 13.0 - 17.0 g/dL   HCT 45.6 39.0 - 52.0 %   MCV 82.0 78.0 - 100.0 fL   MCH 25.9 (L) 26.0 - 34.0 pg   MCHC 31.6 30.0 - 36.0 g/dL   RDW 14.5 11.5 - 15.5 %   Platelets 174 150 - 400 K/uL  Lipase, blood  Result Value Ref Range   Lipase 30 11 - 51 U/L  Troponin I  Result Value Ref Range   Troponin I <0.03 <0.031 ng/mL     Imaging Review Dg Chest 2 View  12/21/2015  CLINICAL DATA:  Altered mental status. EXAM: CHEST - 2 VIEW COMPARISON:  Two-view chest x-ray 09/12/2015 FINDINGS: Heart is enlarged. There is no edema or effusion to suggest failure. Median sternotomy for CABG is noted. The lung volumes are low. Degenerative changes of the thoracic spine are stable. IMPRESSION: Stable cardiomegaly without failure. Electronically Signed   By: San Morelle M.D.   On: 12/21/2015 11:58   Ct Head Wo Contrast  12/21/2015  CLINICAL DATA:  Altered mental status for 1 week EXAM: CT HEAD WITHOUT CONTRAST TECHNIQUE: Contiguous axial images were obtained from the base of the skull through the vertex without intravenous contrast. COMPARISON:  None. FINDINGS: Mild diffuse atrophy is present. There is no intracranial mass, hemorrhage, extra-axial fluid collection, or midline shift. There is patchy small vessel disease throughout the anterior centra semiovale bilaterally. There is a prior infarct in the genu of the right internal capsule. There is ill-defined decreased attenuation involving portions of the anterior limb and genu of the left internal capsule as well as the immediately adjacent left anterior lentiform nucleus, concerning for recent infarct in this area. There is probable chronic small vessel disease in the lateral left thalamus. No other findings concerning for recent infarct seen on this study. Bony calvarium appears intact. The mastoid air cells are clear. There is mucosal thickening in each maxillary antrum. No intraorbital lesions are identified.  IMPRESSION: Suspect recent infarct in the anterior left lentiform nucleus with involvement  of a portion of the anterior aspect in genu of the left internal capsule. Prior infarct genu right internal capsule. There is atrophy with patchy periventricular small vessel disease, primarily anteriorly. Small vessel disease is also noted in the lateral left thalamus. No hemorrhage or mass effect. Electronically Signed   By: Lowella Grip III M.D.   On: 12/21/2015 12:09   US Abdomen Limited  12/21/2015  CLINICAL DATA:  Gallbladder history, mid epigastric pain for 1 day EXAM: US ABDOMEN LIMITED - RIGHT UPPER QUADRANT COMPARISON:  09/12/2015 ; correlation CT abdomen 09/12/2015 FINDINGS: Gallbladder: Distended by echogenic non shadowing material question sludge. No discrete shadowing calculi. Gallbladder wall upper normal thickness 3 mm thick. No sonographic Murphy sign or pericholecystic fluid. Common bile duct: Diameter: Normal caliber 5 mm diameter Liver: Slightly coarsened increased echogenicity which could be seen with fatty infiltration, cirrhosis and certain infiltrative disorders. Hepatic margins appear smooth without gross nodularity or focal mass. Hepatopetal portal venous flow. Liver did not appear cirrhotic by prior CT. No RIGHT upper quadrant ascites. IMPRESSION: Gallbladder filled with sludge with upper normal gallbladder wall thickness. Slightly coarsened increased echogenicity of the liver question fatty infiltration as above. Electronically Signed   By: Lavonia Dana M.D.   On: 12/21/2015 12:28   I have personally reviewed and evaluated these images and lab results as part of my medical decision-making.   EKG Interpretation   Date/Time:  Thursday December 21 2015 10:13:28 EST Ventricular Rate:  61 PR Interval:  161 QRS Duration: 97 QT Interval:  408 QTC Calculation: 411 R Axis:   88 Text Interpretation:  Sinus rhythm Borderline right axis deviation  Borderline repol abnormality, diffuse leads  Baseline wander in lead(s) V4  Confirmed by Donie Lemelin  MD, Charles Andringa ([id number]) on 12/21/2015 10:33:00 AM      MDM   Final diagnoses:  Cerebral infarction due to unspecified mechanism  Altered mental status, unspecified altered mental status type    CT scan consistent with a new left internal capsule infarct. This will probably explain patient's recent the altered mental status. Patient also with known gallbladder disease ultrasound today shows no evidence of acute cholecystitis. Labs without significant abnormalities. Urinalysis still pending. Discussed with hospitalist regarding admission.  I personally performed the services described in this documentation, which was scribed in my presence. The recorded information has been reviewed and is accurate.      Tyler Sorrow, MD 12/21/15 1252

## 2015-12-21 NOTE — H&P (Signed)
[facility] History and Physical  Tyler Mejia [id number] DOB: 1936-09-04 DOA: 12/21/2015  Referring physician: Dr. Rogene Houston PCP: [name], MD   Chief Complaint: altered mental status  HPI: Tyler Mejia is a 80 y.o. male with past medical history of hypertension, coronary artery disease status post CABG in 08/2015, was reportedly in his usual state of health when he reportedly became confused on 1/10. Respiratory confusion has progressively gotten worse. His behavior has been bizarre. For example, he was using his cell phone to try and change channels on the television and using the television on how to make calls. His family feels that he may have had some slurred speech. He did not have any difficulty walking. Denies any unilateral weakness or numbness. Denies any changes in vision or headache. He continues to be confused. He was brought to the ER for evaluation where CT scan of the head showed possible subacute infarct. He is being admitted for treatments.   Review of Systems:  Pertinent positives as per HPI, otherwise negative  Past Medical History  Diagnosis Date  . GERD (gastroesophageal reflux disease)   . Essential hypertension   . Hx of adenomatous colonic polyps   . Nephrolithiasis   . NSTEMI (non-ST elevated myocardial infarction) [facility]) March 2013  . Obesity   . HLD (hyperlipidemia)     a. H/o multiple statin intolerances.  Marland Kitchen COPD (chronic obstructive pulmonary disease) ([place])   . CAD (coronary artery disease)     a. DES to the RCA March 2013 with residual dz rx medically. b. s/p CABGx4 in 08/2015.  . IBS (irritable bowel syndrome)   . Atrial fibrillation, transient ([place])     a. Post-op from CABG 08/2015.  . Collagen vascular disease [facility])    Past Surgical History  Procedure Laterality Date  . Appendectomy    . Esophagogastroduodenoscopy  2003    distal erosion/small hiatal hernia  . Colonoscopy  08/21/2004    Pedunculated polyp at the splenic flexure  .  Colonoscopy  09/2009    Dr. Rourk--> Cecal and hepatic flexure polyps, tubular adenomas. Next colonoscopy October 2013  . Esophagogastroduodenoscopy  09/2009    Dr. Evalee Mutton ring, mild distal esophageal erosions, status post Maloney dilation, small hiatal hernia.  . Back surgery    . Colonoscopy N/A 05/07/2013    RMR: colonic polyp/colonic diverticulosis  . Left heart catheterization with coronary angiogram N/A 03/02/2012    Procedure: LEFT HEART CATHETERIZATION WITH CORONARY ANGIOGRAM;  Surgeon: Peter M Martinique, MD;  Location: [place] CATH LAB;  Service: Cardiovascular;  Laterality: N/A;  . Percutaneous coronary stent intervention (pci-s) N/A 03/02/2012    Procedure: PERCUTANEOUS CORONARY STENT INTERVENTION (PCI-S);  Surgeon: Peter M Martinique, MD;  Location: [facility] CATH LAB;  Service: Cardiovascular;  Laterality: N/A;  . Esophagogastroduodenoscopy N/A 03/16/2015    [id number] HH/focal area of distal esophageal s/p bx  . Cardiac catheterization N/A 08/09/2015    Procedure: Left Heart Cath and Coronary Angiography;  Surgeon: Jettie Booze, MD;  Location: [place] CV LAB;  Service: Cardiovascular;  Laterality: N/A;  . Coronary artery bypass graft N/A 08/11/2015    Procedure: CORONARY ARTERY BYPASS GRAFTING (CABG) X4 UTILIZING THE LEFT INTERNAL MAMMARY ARTERY TO LAD AND ENDOSCOPICALLY HARVESTED BILATERAL SAPHENEOUS VEINS TO DIAGONAL,OM1 AND OM2;  Surgeon: Ivin Poot, MD;  Location: [place];  Service: Open Heart Surgery;  Laterality: N/A;  . Tee without cardioversion N/A 08/11/2015    Procedure: TRANSESOPHAGEAL ECHOCARDIOGRAM (TEE);  Surgeon: Ivin Poot, MD;  Location:  [place] OR;  Service: Open Heart Surgery;  Laterality: N/A;   Social History:  reports that he quit smoking about 14 years ago. His smoking use included Cigarettes. He has a 67.5 pack-year smoking history. He has never used smokeless tobacco. He reports that he does not drink alcohol or use illicit drugs.  Allergies  Allergen Reactions   . Codeine Other (See Comments)    Pt does not know     Family History  Problem Relation Age of Onset  . Pneumonia Father   . Heart disease Mother   . Breast cancer Sister     2 sisters  . Esophageal cancer Brother   . Diabetes Mother   . Colon cancer Neg Hx     Prior to Admission medications   Medication Sig Start Date End Date Taking? Authorizing Provider  acetaminophen (TYLENOL) 325 MG tablet Take 2 tablets (650 mg total) by mouth every 6 (six) hours as needed for mild pain, fever or headache. 08/24/15  Yes Erin R Barrett, PA-C  aspirin EC 325 MG EC tablet Take 1 tablet (325 mg total) by mouth daily. 08/24/15  Yes Erin R Barrett, PA-C  docusate sodium (COLACE) 100 MG capsule Take 2 capsules (200 mg total) by mouth daily. 08/24/15  Yes Erin R Barrett, PA-C  metoprolol tartrate (LOPRESSOR) 25 MG tablet Take 0.5 tablets (12.5 mg total) by mouth 2 (two) times daily. 08/24/15  Yes Erin R Barrett, PA-C  pantoprazole (PROTONIX) 40 MG tablet Take 40 mg by mouth daily.  10/04/15  Yes Historical Provider, MD  sildenafil (REVATIO) 20 MG tablet Take 60-100 mg by mouth daily as needed.   Yes Historical Provider, MD  ezetimibe (ZETIA) 10 MG tablet Take 1 tablet (10 mg total) by mouth daily. Patient not taking: Reported on 12/21/2015 10/25/15   [name], MD   Physical Exam: Filed Vitals:   12/21/15 1300 12/21/15 1330 12/21/15 1440 12/21/15 1640  BP: 162/84 166/84 175/79 182/81  Pulse: 57 57 62 72  Temp:   98.6 F (37 C) 98.3 F (36.8 C)  TempSrc:   Oral Axillary  Resp: 13 21 20 20   Height:   5\' 9"  (1.753 m)   Weight:   102.74 kg (226 lb 8 oz)   SpO2: 95% 94% 97% 96%    Wt Readings from Last 3 Encounters:  12/21/15 102.74 kg (226 lb 8 oz)  10/25/15 102.513 kg (226 lb)  09/13/15 101.787 kg (224 lb 6.4 oz)    General:  Appears calm and comfortable Eyes: PERRL, normal lids, irises & conjunctiva ENT: grossly normal hearing, lips & tongue Neck: no LAD, masses or  thyromegaly Cardiovascular: RRR, no m/r/g. No LE edema. Telemetry: SR, no arrhythmias  Respiratory: CTA bilaterally, no w/r/r. Normal respiratory effort. Abdomen: soft, ntnd Skin: no rash or induration seen on limited exam Musculoskeletal: grossly normal tone BUE/BLE Psychiatric: pleasant, confused Neurologic: grossly non-focal.          Labs on Admission:  Basic Metabolic Panel:  Recent Labs Lab 12/21/15 1040  NA 141  K 3.9  CL 104  CO2 28  GLUCOSE 95  BUN 14  CREATININE 1.11  CALCIUM 9.4   Liver Function Tests:  Recent Labs Lab 12/21/15 1040  AST 23  ALT 14*  ALKPHOS 73  BILITOT 1.5*  PROT 7.5  ALBUMIN 4.1    Recent Labs Lab 12/21/15 1109  LIPASE 30   No results for input(s): AMMONIA in the last 168 hours. CBC:  Recent Labs Lab  12/21/15 1040  WBC 7.6  HGB 14.4  HCT 45.6  MCV 82.0  PLT 174   Cardiac Enzymes:  Recent Labs Lab 12/21/15 1109  TROPONINI <0.03    BNP (last 3 results) No results for input(s): BNP in the last 8760 hours.  ProBNP (last 3 results) No results for input(s): PROBNP in the last 8760 hours.  CBG: No results for input(s): GLUCAP in the last 168 hours.  Radiological Exams on Admission: Dg Chest 2 View  12/21/2015  CLINICAL DATA:  Altered mental status. EXAM: CHEST - 2 VIEW COMPARISON:  Two-view chest x-ray 09/12/2015 FINDINGS: Heart is enlarged. There is no edema or effusion to suggest failure. Median sternotomy for CABG is noted. The lung volumes are low. Degenerative changes of the thoracic spine are stable. IMPRESSION: Stable cardiomegaly without failure. Electronically Signed   By: San Morelle M.D.   On: 12/21/2015 11:58   Ct Head Wo Contrast  12/21/2015  CLINICAL DATA:  Altered mental status for 1 week EXAM: CT HEAD WITHOUT CONTRAST TECHNIQUE: Contiguous axial images were obtained from the base of the skull through the vertex without intravenous contrast. COMPARISON:  None. FINDINGS: Mild diffuse atrophy  is present. There is no intracranial mass, hemorrhage, extra-axial fluid collection, or midline shift. There is patchy small vessel disease throughout the anterior centra semiovale bilaterally. There is a prior infarct in the genu of the right internal capsule. There is ill-defined decreased attenuation involving portions of the anterior limb and genu of the left internal capsule as well as the immediately adjacent left anterior lentiform nucleus, concerning for recent infarct in this area. There is probable chronic small vessel disease in the lateral left thalamus. No other findings concerning for recent infarct seen on this study. Bony calvarium appears intact. The mastoid air cells are clear. There is mucosal thickening in each maxillary antrum. No intraorbital lesions are identified. IMPRESSION: Suspect recent infarct in the anterior left lentiform nucleus with involvement of a portion of the anterior aspect in genu of the left internal capsule. Prior infarct genu right internal capsule. There is atrophy with patchy periventricular small vessel disease, primarily anteriorly. Small vessel disease is also noted in the lateral left thalamus. No hemorrhage or mass effect. Electronically Signed   By: Lowella Grip III M.D.   On: 12/21/2015 12:09   US Abdomen Limited  12/21/2015  CLINICAL DATA:  Gallbladder history, mid epigastric pain for 1 day EXAM: US ABDOMEN LIMITED - RIGHT UPPER QUADRANT COMPARISON:  09/12/2015 ; correlation CT abdomen 09/12/2015 FINDINGS: Gallbladder: Distended by echogenic non shadowing material question sludge. No discrete shadowing calculi. Gallbladder wall upper normal thickness 3 mm thick. No sonographic Murphy sign or pericholecystic fluid. Common bile duct: Diameter: Normal caliber 5 mm diameter Liver: Slightly coarsened increased echogenicity which could be seen with fatty infiltration, cirrhosis and certain infiltrative disorders. Hepatic margins appear smooth without gross  nodularity or focal mass. Hepatopetal portal venous flow. Liver did not appear cirrhotic by prior CT. No RIGHT upper quadrant ascites. IMPRESSION: Gallbladder filled with sludge with upper normal gallbladder wall thickness. Slightly coarsened increased echogenicity of the liver question fatty infiltration as above. Electronically Signed   By: Lavonia Dana M.D.   On: 12/21/2015 12:28    EKG: Independently reviewed. No acute findings  Assessment/Plan Principal Problem:   Altered mental status Active Problems:   Essential hypertension   HLD (hyperlipidemia)   Coronary artery disease involving native coronary artery   S/P CABG x 4   CVA (cerebral infarction)  1. Possible subacute left internal capsule infarct. We'll evaluate further with MRI of the brain/MRA head. Check carotid Dopplers. He recently had echocardiogram in 08/2015. This will not be repeated. Check lipid panel and hemoglobin A1c. Continue on aspirin. 2. Altered mental status. Possibly related to #1. His family also reports episodes of urinary incontinence. Will check EEG. Medications may also be playing a role since his family reports he's been taking over-the-counter cold medicine which also contains antihistamines leading to confusion. He does not report being on any narcotics or other sedative medications. Will request neurology input. 3. Hyperlipidemia. zetia listed in his medications prior to admission. This time. He has been taking this. Will check lipid panel. 4. Hypertension. Continue metoprolol.    Code Status: full code DVT Prophylaxis: lovenox Family Communication: discussed with son and daughter in law at the bedside Disposition Plan: discharge home once improved  Time spent: 43mins  MEMON,JEHANZEB [facility] Pager [phone]

## 2015-12-22 ENCOUNTER — Other Ambulatory Visit (HOSPITAL_COMMUNITY): Payer: Medicare Other

## 2015-12-22 ENCOUNTER — Inpatient Hospital Stay (HOSPITAL_COMMUNITY): Payer: Medicare Other

## 2015-12-22 ENCOUNTER — Inpatient Hospital Stay (HOSPITAL_COMMUNITY)
Admit: 2015-12-22 | Discharge: 2015-12-22 | Disposition: A | Discharge status: Home / Self Care | Payer: Medicare Other | Attending: Internal Medicine | Admitting: Internal Medicine

## 2015-12-22 LAB — VITAMIN B12: VITAMIN B 12: 315 pg/mL (ref 180–914)

## 2015-12-22 LAB — LIPID PANEL
Cholesterol: 128 mg/dL (ref 0–200)
HDL: 29 mg/dL — ABNORMAL LOW (ref >40)
LDL CALC: 73 mg/dL (ref 0–99)
Total CHOL/HDL Ratio: 4.4 RATIO
Triglycerides: 129 mg/dL (ref <150)
VLDL: 26 mg/dL (ref 0–40)

## 2015-12-22 LAB — TSH: TSH: 1.217 u[IU]/mL (ref 0.350–4.500)

## 2015-12-22 LAB — SEDIMENTATION RATE: SED RATE: 7 mm/h (ref 0–16)

## 2015-12-22 LAB — C-REACTIVE PROTEIN: CRP: 0.7 mg/dL (ref <1.0)

## 2015-12-22 NOTE — Consult Note (Signed)
Tyler A. Merlene Laughter, MD     [email or web address]          Tyler Mejia is an 80 y.o. male.   ASSESSMENT/PLAN:  DDX: The differential diagnosis includes several possibilities including manganese poisoning, carbon monoxide poisoning, cyanide poisoning, methanol poisoning, hepatocellular degeneration due to chronic alcoholic cirrhosis and non- alcoholic liver cirrhosis, deep venous thrombosis, nutritional deficiencies/Wernicke encephalopathy, extra-pontine myelinolysis and Creutzfeldt-Jakob disease. Anoxic brain injury and hypoglycemia are also possibilities but less likely in this situation. Mitochondrial disorders are also possibility but seems unlikely in this clinical situation. The patient's MRI findings are almost limited exclusively to the internal capsule bilaterally which I think suggest ischemic infarcts. 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   Likely undiagnosed obstructive sleep apnea syndrome.  RECOMMENDATION: EEG. The patient will need to have a spinal tap. Serologies should should include prior protein 14-3-3 and tau. Additional labs for the following: RPR, HIV, ESR, TSH, ANA, C-reactive protein  The patient is a 80 year old white male who at baseline is constantly attack and quite functional. Patient lives by himself but does have a girlfriend that visits him often. History is obtained from her and also from the son over the phone. Though girlfriend reports that about 2-1/2 weeks ago he started becoming really sleepy often dozing off doing things. This has been associated with confusion, memory impairment and nonsensical speech. The son reports that he has noticed a issue over the last week or so and that it has gotten worse. He has done inappropriate and on things such as trying to use his cell phone to function as the TV remote. The girlfriend reports that he is very talkative but recently has been withdrawn and not talking to anyone.  The patient himself is reserved in the has very limited spontaneous speech. He does not complain of any issues at this time. They report that he has had some significant headaches after his bypass surgery in September 2016 but this has resolved recently. No reports of focal numbness, weakness, headaches, chest pain or shortness of breath. The patient does not use wood logs for heating his home. 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   GENERAL: Moderate weight no acute distress.  HEENT: Supple. Atraumatic normocephalic. Short stocky neck.  ABDOMEN: soft  EXTREMITIES: No edema   BACK: Normal.  SKIN: Normal by inspection.    MENTAL STATUS: The patient is awake and alert. He is oriented to hospital and month but thinks is 2016. He does has reduced spontaneous speech. Sometimes he has speech is nonsensical especially related to why he is in the hospital. He reports that he has been placed here because of issues with urination. There appears to be subtle issues with naming and language along with subtle issues with repetition. No dysarthria.  CRANIAL NERVES: Pupils are equal, round and reactive to light and accommodation; extra ocular movements are full, there is no significant nystagmus; visual fields are full; upper and lower facial muscles are normal in strength and symmetric, there is no flattening of the nasolabial folds; tongue is midline; uvula is midline; shoulder elevation is normal.  MOTOR: Normal tone, bulk and strength; no pronator drift.  COORDINATION: Left finger to nose is normal, right finger to nose is normal, No rest tremor; no intention tremor; no postural tremor; no bradykinesia.  REFLEXES: Deep tendon reflexes are symmetrical and normal. Babinski reflexes are flexor bilaterally.   SENSATION: Normal to light touch.  DR MEMONS NOTE HPI: Tyler Mejia is a 80 y.o. male with past medical history of hypertension, coronary  artery disease status post CABG in 08/2015, was reportedly in his usual state of health when he reportedly became confused on 1/10. Respiratory confusion has progressively gotten worse. His behavior has been bizarre. For example, he was using his cell phone to try and change channels on the television and using the television on how to make calls. His family feels that he may have had some slurred speech. He did not have any difficulty walking. Denies any unilateral weakness or numbness. Denies any changes in vision or headache. He continues to be confused. He was brought to the ER for evaluation where CT scan of the head showed possible subacute infarct. He is being admitted for treatments ER MD- NOTE Tyler Mejia is a 80 y.o. male, accompanied by his family, who lives alone, with PMHx noted below, who presents to the Emergency Department complaining of subacute, intermittent confusion onset last week. Pt's son suspects pt's Sx began after pt's PCP started him on a new med-- the identity of the med is unknown by pt's son. Pt's son reports pt is unable to recall street names, cannot differentiate between his cell phone and TV remote, and is not responding to door bell/phone calls.  Associated Sx include multiple episodes of fecal incontinence onset couple of days ago. Pt's son reports a Hx of similar Sx after pt was started on certain meds after a stent placement a few years ago. Pt's son denies cough, vomiting, or fever.   Blood pressure 148/73, pulse 63, temperature 98.4 F (36.9 C), temperature source Oral, resp. rate 20, height 5' 9"  (1.753 m), weight 102.74 kg (226 lb 8 oz), SpO2 95 %.  Past Medical History  Diagnosis Date  . GERD (gastroesophageal reflux disease)   . Essential hypertension   . Hx of adenomatous colonic polyps   . Nephrolithiasis   . NSTEMI (non-ST elevated myocardial infarction) [facility]) March 2013  . Obesity   . HLD (hyperlipidemia)     a. H/o multiple statin intolerances.  Marland Kitchen  COPD (chronic obstructive pulmonary disease) ([place])   . CAD (coronary artery disease)     a. DES to the RCA March 2013 with residual dz rx medically. b. s/p CABGx4 in 08/2015.  . IBS (irritable bowel syndrome)   . Atrial fibrillation, transient ([place])     a. Post-op from CABG 08/2015.  . Collagen vascular disease [facility])     Past Surgical History  Procedure Laterality Date  . Appendectomy    . Esophagogastroduodenoscopy  2003    distal erosion/small hiatal hernia  . Colonoscopy  08/21/2004    Pedunculated polyp at the splenic flexure  . Colonoscopy  09/2009    Dr. Rourk--> Cecal and hepatic flexure polyps, tubular adenomas. Next colonoscopy October 2013  . Esophagogastroduodenoscopy  09/2009    Dr. Evalee Mutton ring, mild distal esophageal erosions, status post Maloney dilation, small hiatal hernia.  . Back surgery    . Colonoscopy N/A 05/07/2013    RMR: colonic polyp/colonic diverticulosis  . Left heart catheterization with coronary angiogram N/A 03/02/2012    Procedure: LEFT HEART CATHETERIZATION WITH CORONARY ANGIOGRAM;  Surgeon: Peter M Martinique, MD;  Location: [facility] CATH LAB;  Service: Cardiovascular;  Laterality: N/A;  . Percutaneous coronary stent intervention (pci-s) N/A 03/02/2012    Procedure: PERCUTANEOUS CORONARY STENT INTERVENTION (PCI-S);  Surgeon: Peter M Martinique, MD;  Location: [facility] CATH LAB;  Service: Cardiovascular;  Laterality:  N/A;  . Esophagogastroduodenoscopy N/A 03/16/2015    JWJ:[id number] HH/focal area of distal esophageal s/p bx  . Cardiac catheterization N/A 08/09/2015    Procedure: Left Heart Cath and Coronary Angiography;  Surgeon: Jettie Booze, MD;  Location: [place] CV LAB;  Service: Cardiovascular;  Laterality: N/A;  . Coronary artery bypass graft N/A 08/11/2015    Procedure: CORONARY ARTERY BYPASS GRAFTING (CABG) X4 UTILIZING THE LEFT INTERNAL MAMMARY ARTERY TO LAD AND ENDOSCOPICALLY HARVESTED BILATERAL SAPHENEOUS VEINS TO DIAGONAL,OM1 AND OM2;  Surgeon: Ivin Poot, MD;  Location: [place];  Service: Open Heart Surgery;  Laterality: N/A;  . Tee without cardioversion N/A 08/11/2015    Procedure: TRANSESOPHAGEAL ECHOCARDIOGRAM (TEE);  Surgeon: Ivin Poot, MD;  Location: [place];  Service: Open Heart Surgery;  Laterality: N/A;    Family History  Problem Relation Age of Onset  . Pneumonia Father   . Heart disease Mother   . Breast cancer Sister     2 sisters  . Esophageal cancer Brother   . Diabetes Mother   . Colon cancer Neg Hx     Social History:  reports that he quit smoking about 14 years ago. His smoking use included Cigarettes. He has a 67.5 pack-year smoking history. He has never used smokeless tobacco. He reports that he does not drink alcohol or use illicit drugs.  Allergies:  Allergies  Allergen Reactions  . Codeine Other (See Comments)    Pt does not know     Medications: Prior to Admission medications   Medication Sig Start Date End Date Taking? Authorizing Provider  acetaminophen (TYLENOL) 325 MG tablet Take 2 tablets (650 mg total) by mouth every 6 (six) hours as needed for mild pain, fever or headache. 08/24/15  Yes Erin R Barrett, PA-C  aspirin EC 325 MG EC tablet Take 1 tablet (325 mg total) by mouth daily. 08/24/15  Yes Erin R Barrett, PA-C  docusate sodium (COLACE) 100 MG capsule Take 2 capsules (200 mg total) by mouth daily. 08/24/15  Yes Erin R Barrett, PA-C  metoprolol tartrate (LOPRESSOR) 25 MG tablet Take 0.5 tablets (12.5 mg total) by mouth 2 (two) times daily. 08/24/15  Yes Erin R Barrett, PA-C  pantoprazole (PROTONIX) 40 MG tablet Take 40 mg by mouth daily.  10/04/15  Yes Historical Provider, MD  sildenafil (REVATIO) 20 MG tablet Take 60-100 mg by mouth daily as needed.   Yes Historical Provider, MD  ezetimibe (ZETIA) 10 MG tablet Take 1 tablet (10 mg total) by mouth daily. Patient not taking: Reported on 12/21/2015 10/25/15   [name], MD    Scheduled Meds: .  stroke: mapping our early stages of recovery  book   Does not apply Once  . aspirin EC  325 mg Oral Daily  . docusate sodium  200 mg Oral Daily  . enoxaparin (LOVENOX) injection  40 mg Subcutaneous Q24H  . metoprolol tartrate  12.5 mg Oral BID   Continuous Infusions:  PRN Meds:.acetaminophen **OR** acetaminophen, senna-docusate     Results for orders placed or performed during the hospital encounter of 12/21/15 (from the past 48 hour(s))  Comprehensive metabolic panel     Status: Abnormal   Collection Time: 12/21/15 10:40 AM  Result Value Ref Range   Sodium 141 135 - 145 mmol/L   Potassium 3.9 3.5 - 5.1 mmol/L   Chloride 104 101 - 111 mmol/L   CO2 28 22 - 32 mmol/L   Glucose, Bld 95 65 - 99 mg/dL  BUN 14 6 - 20 mg/dL   Creatinine, Ser 1.11 0.61 - 1.24 mg/dL   Calcium 9.4 8.9 - 10.3 mg/dL   Total Protein 7.5 6.5 - 8.1 g/dL   Albumin 4.1 3.5 - 5.0 g/dL   AST 23 15 - 41 U/L   ALT 14 (L) 17 - 63 U/L   Alkaline Phosphatase 73 38 - 126 U/L   Total Bilirubin 1.5 (H) 0.3 - 1.2 mg/dL   GFR calc non Af Amer >60 >60 mL/min   GFR calc Af Amer >60 >60 mL/min    Comment: (NOTE) The eGFR has been calculated using the CKD EPI equation. This calculation has not been validated in all clinical situations. eGFR's persistently <60 mL/min signify possible Chronic Kidney Disease.    Anion gap 9 5 - 15  CBC     Status: Abnormal   Collection Time: 12/21/15 10:40 AM  Result Value Ref Range   WBC 7.6 4.0 - 10.5 K/uL   RBC 5.56 4.22 - 5.81 MIL/uL   Hemoglobin 14.4 13.0 - 17.0 g/dL   HCT 45.6 39.0 - 52.0 %   MCV 82.0 78.0 - 100.0 fL   MCH 25.9 (L) 26.0 - 34.0 pg   MCHC 31.6 30.0 - 36.0 g/dL   RDW 14.5 11.5 - 15.5 %   Platelets 174 150 - 400 K/uL  Urinalysis, Routine w reflex microscopic (not at [facility])     Status: Abnormal   Collection Time: 12/21/15 10:40 AM  Result Value Ref Range   Color, Urine YELLOW YELLOW   APPearance CLEAR CLEAR   Specific Gravity, Urine 1.020 1.005 - 1.030   pH 6.5 5.0 - 8.0   Glucose, UA NEGATIVE NEGATIVE  mg/dL   Hgb urine dipstick NEGATIVE NEGATIVE   Bilirubin Urine NEGATIVE NEGATIVE   Ketones, ur NEGATIVE NEGATIVE mg/dL   Protein, ur 100 (A) NEGATIVE mg/dL   Nitrite NEGATIVE NEGATIVE   Leukocytes, UA NEGATIVE NEGATIVE  Urine microscopic-add on     Status: Abnormal   Collection Time: 12/21/15 10:40 AM  Result Value Ref Range   Squamous Epithelial / LPF 0-5 (A) NONE SEEN   WBC, UA 0-5 0 - 5 WBC/hpf   RBC / HPF NONE SEEN 0 - 5 RBC/hpf   Bacteria, UA RARE (A) NONE SEEN  Lipase, blood     Status: None   Collection Time: 12/21/15 11:09 AM  Result Value Ref Range   Lipase 30 11 - 51 U/L  Troponin I     Status: None   Collection Time: 12/21/15 11:09 AM  Result Value Ref Range   Troponin I <0.03 <0.031 ng/mL    Comment:        NO INDICATION OF MYOCARDIAL INJURY.   Lipid panel     Status: Abnormal   Collection Time: 12/22/15  6:53 AM  Result Value Ref Range   Cholesterol 128 0 - 200 mg/dL   Triglycerides 129 <150 mg/dL   HDL 29 (L) >40 mg/dL   Total CHOL/HDL Ratio 4.4 RATIO   VLDL 26 0 - 40 mg/dL   LDL Cholesterol 73 0 - 99 mg/dL    Comment:        Total Cholesterol/HDL:CHD Risk Coronary Heart Disease Risk Table                     Men   Women  1/2 Average Risk   3.4   3.3  Average Risk       5.0   4.4  2 X Average Risk   9.6   7.1  3 X Average Risk  23.4   11.0        Use the calculated Patient Ratio above and the CHD Risk Table to determine the patient's CHD Risk.        ATP III CLASSIFICATION (LDL):  <100     mg/dL   Optimal  100-129  mg/dL   Near or Above                    Optimal  130-159  mg/dL   Borderline  160-189  mg/dL   High  >190     mg/dL   Very High     Studies/Results: The patient's brain MRI is reviewed in person. There is increased signal seen on diffusion images involving the left internal capsule at the genu and anterior portion. There is some involvement of the associated basal ganglia but the epicenter and the primary focus is internal capsule.  Similar findings are seen on the contralateral side but only one third as severe involving and with brightness been less intense suggestive of more subacute onset on the right side. There is corresponding reduced signal seen on the ADC scan on the left side but not on the right side. Chronic lacunar infarction is seen in involving the genu of the internal capsule on the right side extending into the centrum semiovale. There is also a chronic left thalamic lacunar infarct. 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    IMPRESSION: MRI HEAD IMPRESSION:   1. Abnormal diffusion abnormality within the bilateral lentiform nuclei (globus pallidi), left worse than right. Primary differential consideration includes toxic metabolic derangement or anoxic injury, with carbon monoxide poisoning being the most classical example to cause this abnormality. 2. Moderate chronic small vessel ischemic disease with remote lacunar infarcts within the right internal capsule and left thalamus.   MRA HEAD IMPRESSION:   1. No large or proximal arterial branch occlusion within the intracranial circulation. 2. Single short segment mild to moderate focal stenosis within the distal left M1 segment. Otherwise negative intracranial MRA.        Tessy Pawelski A. Merlene Mejia, M.D.  Diplomate, Tax adviser of Psychiatry and Neurology ( Neurology). 12/22/2015, 2:27 PM

## 2015-12-22 NOTE — Progress Notes (Signed)
Subjective: Tyler Mejia was admitted yesterday by the hospitalist after presenting with altered mental status. He was initially felt to have had a stroke based on CT findings. 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 He lives alone so no others have been similarly affected. He has not used a generator or space heater during the recent cold weather. His only complaint is that he has had some pain in his eyes. He has been confused and has had loss of control of his bowel and his bladder at times.  Objective: Vital signs in last 24 hours: Filed Vitals:   12/22/15 0040 12/22/15 0240 12/22/15 0440 12/22/15 0640  BP: 178/71 151/74 160/71 152/80  Pulse: 61 60 60 58  Temp: 97.8 F (36.6 C) 97.8 F (36.6 C) 97.8 F (36.6 C) 98 F (36.7 C)  TempSrc: Oral Oral Oral Oral  Resp: 15 15 17 18   Height:      Weight:      SpO2: 97% 97% 96% 96%   Weight change:   Intake/Output Summary (Last 24 hours) at 12/22/15 0747 Last data filed at 12/21/15 1800  Gross per 24 hour  Intake    300 ml  Output    250 ml  Net     50 ml    Physical Exam: Alert. No distress. He is unable to correctly tell me the month or year or day. Eyes unremarkable. Lungs clear. Heart regular with no murmurs. Abdomen soft and nontender with no palpable organomegaly. Extremities reveal no edema. Neuro exam reveals no focal weakness.  Lab Results:    Results for orders placed or performed during the hospital encounter of 12/21/15 (from the past 24 hour(s))  Comprehensive metabolic panel     Status: Abnormal   Collection Time: 12/21/15 10:40 AM  Result Value Ref Range   Sodium 141 135 - 145 mmol/L   Potassium 3.9 3.5 - 5.1 mmol/L   Chloride 104 101 - 111 mmol/L   CO2 28 22 - 32 mmol/L   Glucose, Bld 95 65 - 99 mg/dL   BUN 14 6 - 20 mg/dL   Creatinine, Ser 1.11 0.61 - 1.24 mg/dL   Calcium 9.4 8.9 - 10.3 mg/dL   Total Protein  7.5 6.5 - 8.1 g/dL   Albumin 4.1 3.5 - 5.0 g/dL   AST 23 15 - 41 U/L   ALT 14 (L) 17 - 63 U/L   Alkaline Phosphatase 73 38 - 126 U/L   Total Bilirubin 1.5 (H) 0.3 - 1.2 mg/dL   GFR calc non Af Amer >60 >60 mL/min   GFR calc Af Amer >60 >60 mL/min   Anion gap 9 5 - 15  CBC     Status: Abnormal   Collection Time: 12/21/15 10:40 AM  Result Value Ref Range   WBC 7.6 4.0 - 10.5 K/uL   RBC 5.56 4.22 - 5.81 MIL/uL   Hemoglobin 14.4 13.0 - 17.0 g/dL   HCT 45.6 39.0 - 52.0 %   MCV 82.0 78.0 - 100.0 fL   MCH 25.9 (L) 26.0 - 34.0 pg   MCHC 31.6 30.0 - 36.0 g/dL   RDW 14.5 11.5 - 15.5 %   Platelets 174 150 - 400 K/uL  Urinalysis, Routine w reflex microscopic (not at [facility])     Status: Abnormal   Collection Time: 12/21/15 10:40 AM  Result Value Ref Range   Color, Urine YELLOW YELLOW  APPearance CLEAR CLEAR   Specific Gravity, Urine 1.020 1.005 - 1.030   pH 6.5 5.0 - 8.0   Glucose, UA NEGATIVE NEGATIVE mg/dL   Hgb urine dipstick NEGATIVE NEGATIVE   Bilirubin Urine NEGATIVE NEGATIVE   Ketones, ur NEGATIVE NEGATIVE mg/dL   Protein, ur 100 (A) NEGATIVE mg/dL   Nitrite NEGATIVE NEGATIVE   Leukocytes, UA NEGATIVE NEGATIVE  Urine microscopic-add on     Status: Abnormal   Collection Time: 12/21/15 10:40 AM  Result Value Ref Range   Squamous Epithelial / LPF 0-5 (A) NONE SEEN   WBC, UA 0-5 0 - 5 WBC/hpf   RBC / HPF NONE SEEN 0 - 5 RBC/hpf   Bacteria, UA RARE (A) NONE SEEN  Lipase, blood     Status: None   Collection Time: 12/21/15 11:09 AM  Result Value Ref Range   Lipase 30 11 - 51 U/L  Troponin I     Status: None   Collection Time: 12/21/15 11:09 AM  Result Value Ref Range   Troponin I <0.03 <0.031 ng/mL     ABGS No results for input(s): PHART, PO2ART, TCO2, HCO3 in the last 72 hours.  Invalid input(s): PCO2 CULTURES No results found for this or any previous visit (from the past 240 hour(s)). Studies/Results: Dg Chest 2 View  12/21/2015  CLINICAL DATA:  Altered mental status.  EXAM: CHEST - 2 VIEW COMPARISON:  Two-view chest x-ray 09/12/2015 FINDINGS: Heart is enlarged. There is no edema or effusion to suggest failure. Median sternotomy for CABG is noted. The lung volumes are low. Degenerative changes of the thoracic spine are stable. IMPRESSION: Stable cardiomegaly without failure. Electronically Signed   By: San Morelle M.D.   On: 12/21/2015 11:58   Ct Head Wo Contrast  12/21/2015  CLINICAL DATA:  Altered mental status for 1 week EXAM: CT HEAD WITHOUT CONTRAST TECHNIQUE: Contiguous axial images were obtained from the base of the skull through the vertex without intravenous contrast. COMPARISON:  None. FINDINGS: Mild diffuse atrophy is present. There is no intracranial mass, hemorrhage, extra-axial fluid collection, or midline shift. There is patchy small vessel disease throughout the anterior centra semiovale bilaterally. There is a prior infarct in the genu of the right internal capsule. There is ill-defined decreased attenuation involving portions of the anterior limb and genu of the left internal capsule as well as the immediately adjacent left anterior lentiform nucleus, concerning for recent infarct in this area. There is probable chronic small vessel disease in the lateral left thalamus. No other findings concerning for recent infarct seen on this study. Bony calvarium appears intact. The mastoid air cells are clear. There is mucosal thickening in each maxillary antrum. No intraorbital lesions are identified. IMPRESSION: Suspect recent infarct in the anterior left lentiform nucleus with involvement of a portion of the anterior aspect in genu of the left internal capsule. Prior infarct genu right internal capsule. There is atrophy with patchy periventricular small vessel disease, primarily anteriorly. Small vessel disease is also noted in the lateral left thalamus. No hemorrhage or mass effect. Electronically Signed   By: Lowella Grip III M.D.   On: 12/21/2015  12:09   Mr Jodene Nam Head Wo Contrast  12/21/2015  CLINICAL DATA:  Initial evaluation for acute confusion. EXAM: MRI HEAD WITHOUT CONTRAST MRA HEAD WITHOUT CONTRAST TECHNIQUE: Multiplanar, multiecho pulse sequences of the brain and surrounding structures were obtained without intravenous contrast. Angiographic images of the head were obtained using MRA technique without contrast. COMPARISON:  Prior CT from 12/21/2015.  FINDINGS: MRI HEAD FINDINGS Cerebral volume within normal limits for patient age. Patchy and confluent T2/FLAIR hyperintensity within the periventricular and deep white matter most likely related to chronic small vessel ischemic disease, moderate in nature. Remote lacunar infarcts within the genu of the right internal capsule and left thalamus. There is abnormal restricted diffusion involving the left globus pallidus. Associated hypo intense signal intensity on the ADC map. Additional abnormal diffusion signal intensity present within the right globus pallidus as well, although this is slightly less prominent and signal intensity. Associated ADC signal intensity not definitely seen on the right. No intrinsic T1 signal intensity to suggest hemorrhage. No significant mass effect. Foci of restricted diffusion to suggest acute infarct. Major intracranial vascular flow voids are maintained. Gray-white matter differentiation otherwise preserved. No mass lesion, midline shift, or mass effect. No hydrocephalus. No extra-axial fluid collection. Craniocervical junction normal. Pituitary gland within normal limits. No acute abnormality about the orbits. Scattered mucosal thickening within the ethmoidal air cells and maxillary sinuses. Small fluid level present within the left maxillary sinus. No mastoid effusion. Inner ear structures grossly normal. Bone marrow signal intensity normal. No scalp soft tissue abnormality. MRA HEAD FINDINGS ANTERIOR CIRCULATION: Visualized distal cervical segments of the internal  carotid arteries are widely patent with antegrade flow. Petrous, cavernous, and supraclinoid segments are widely patent. A1 segments and anterior cerebral arteries well opacified. Right M1 segment widely patent without stenosis or occlusion. Right MCA bifurcation normal. Distal right MCA branches well opacified. Left M1 segment somewhat attenuated, felt to likely be at least partly related to motion on this exam. There is a possible short segment mild to moderate stenosis within the distal left M1 segment (series 108, image 19). MCA bifurcation normal on the left. Left MCA branches well opacified distally. POSTERIOR CIRCULATION: Vertebral arteries patent to the vertebrobasilar junction. Posterior inferior cerebral arteries patent bilaterally. Basilar artery well opacified. Superior cerebellar arteries patent proximally. Both posterior cerebral arteries arise in the basilar artery and are well opacified to their distal aspects. No aneurysm. IMPRESSION: MRI HEAD IMPRESSION: 1. Abnormal diffusion abnormality within the bilateral lentiform nuclei (globus pallidi), left worse than right. Primary differential consideration includes toxic metabolic derangement or anoxic injury, with carbon monoxide poisoning being the most classical example to cause this abnormality. 2. Moderate chronic small vessel ischemic disease with remote lacunar infarcts within the right internal capsule and left thalamus. MRA HEAD IMPRESSION: 1. No large or proximal arterial branch occlusion within the intracranial circulation. 2. Single short segment mild to moderate focal stenosis within the distal left M1 segment. Otherwise negative intracranial MRA. Electronically Signed   By: Jeannine Boga M.D.   On: 12/21/2015 22:31   Mr Brain Wo Contrast  12/21/2015  CLINICAL DATA:  Initial evaluation for acute confusion. EXAM: MRI HEAD WITHOUT CONTRAST MRA HEAD WITHOUT CONTRAST TECHNIQUE: Multiplanar, multiecho pulse sequences of the brain and  surrounding structures were obtained without intravenous contrast. Angiographic images of the head were obtained using MRA technique without contrast. COMPARISON:  Prior CT from 12/21/2015. FINDINGS: MRI HEAD FINDINGS Cerebral volume within normal limits for patient age. Patchy and confluent T2/FLAIR hyperintensity within the periventricular and deep white matter most likely related to chronic small vessel ischemic disease, moderate in nature. Remote lacunar infarcts within the genu of the right internal capsule and left thalamus. There is abnormal restricted diffusion involving the left globus pallidus. Associated hypo intense signal intensity on the ADC map. Additional abnormal diffusion signal intensity present within the right globus pallidus as well,  although this is slightly less prominent and signal intensity. Associated ADC signal intensity not definitely seen on the right. No intrinsic T1 signal intensity to suggest hemorrhage. No significant mass effect. Foci of restricted diffusion to suggest acute infarct. Major intracranial vascular flow voids are maintained. Gray-white matter differentiation otherwise preserved. No mass lesion, midline shift, or mass effect. No hydrocephalus. No extra-axial fluid collection. Craniocervical junction normal. Pituitary gland within normal limits. No acute abnormality about the orbits. Scattered mucosal thickening within the ethmoidal air cells and maxillary sinuses. Small fluid level present within the left maxillary sinus. No mastoid effusion. Inner ear structures grossly normal. Bone marrow signal intensity normal. No scalp soft tissue abnormality. MRA HEAD FINDINGS ANTERIOR CIRCULATION: Visualized distal cervical segments of the internal carotid arteries are widely patent with antegrade flow. Petrous, cavernous, and supraclinoid segments are widely patent. A1 segments and anterior cerebral arteries well opacified. Right M1 segment widely patent without stenosis or  occlusion. Right MCA bifurcation normal. Distal right MCA branches well opacified. Left M1 segment somewhat attenuated, felt to likely be at least partly related to motion on this exam. There is a possible short segment mild to moderate stenosis within the distal left M1 segment (series 108, image 19). MCA bifurcation normal on the left. Left MCA branches well opacified distally. POSTERIOR CIRCULATION: Vertebral arteries patent to the vertebrobasilar junction. Posterior inferior cerebral arteries patent bilaterally. Basilar artery well opacified. Superior cerebellar arteries patent proximally. Both posterior cerebral arteries arise in the basilar artery and are well opacified to their distal aspects. No aneurysm. IMPRESSION: MRI HEAD IMPRESSION: 1. Abnormal diffusion abnormality within the bilateral lentiform nuclei (globus pallidi), left worse than right. Primary differential consideration includes toxic metabolic derangement or anoxic injury, with carbon monoxide poisoning being the most classical example to cause this abnormality. 2. Moderate chronic small vessel ischemic disease with remote lacunar infarcts within the right internal capsule and left thalamus. MRA HEAD IMPRESSION: 1. No large or proximal arterial branch occlusion within the intracranial circulation. 2. Single short segment mild to moderate focal stenosis within the distal left M1 segment. Otherwise negative intracranial MRA. Electronically Signed   By: Jeannine Boga M.D.   On: 12/21/2015 22:31   US Abdomen Limited  12/21/2015  CLINICAL DATA:  Gallbladder history, mid epigastric pain for 1 day EXAM: US ABDOMEN LIMITED - RIGHT UPPER QUADRANT COMPARISON:  09/12/2015 ; correlation CT abdomen 09/12/2015 FINDINGS: Gallbladder: Distended by echogenic non shadowing material question sludge. No discrete shadowing calculi. Gallbladder wall upper normal thickness 3 mm thick. No sonographic Murphy sign or pericholecystic fluid. Common bile duct:  Diameter: Normal caliber 5 mm diameter Liver: Slightly coarsened increased echogenicity which could be seen with fatty infiltration, cirrhosis and certain infiltrative disorders. Hepatic margins appear smooth without gross nodularity or focal mass. Hepatopetal portal venous flow. Liver did not appear cirrhotic by prior CT. No RIGHT upper quadrant ascites. IMPRESSION: Gallbladder filled with sludge with upper normal gallbladder wall thickness. Slightly coarsened increased echogenicity of the liver question fatty infiltration as above. Electronically Signed   By: Lavonia Dana M.D.   On: 12/21/2015 12:28   Micro Results: No results found for this or any previous visit (from the past 240 hour(s)). Studies/Results: Dg Chest 2 View  12/21/2015  CLINICAL DATA:  Altered mental status. EXAM: CHEST - 2 VIEW COMPARISON:  Two-view chest x-ray 09/12/2015 FINDINGS: Heart is enlarged. There is no edema or effusion to suggest failure. Median sternotomy for CABG is noted. The lung volumes are low. Degenerative changes of  the thoracic spine are stable. IMPRESSION: Stable cardiomegaly without failure. Electronically Signed   By: San Morelle M.D.   On: 12/21/2015 11:58   Ct Head Wo Contrast  12/21/2015  CLINICAL DATA:  Altered mental status for 1 week EXAM: CT HEAD WITHOUT CONTRAST TECHNIQUE: Contiguous axial images were obtained from the base of the skull through the vertex without intravenous contrast. COMPARISON:  None. FINDINGS: Mild diffuse atrophy is present. There is no intracranial mass, hemorrhage, extra-axial fluid collection, or midline shift. There is patchy small vessel disease throughout the anterior centra semiovale bilaterally. There is a prior infarct in the genu of the right internal capsule. There is ill-defined decreased attenuation involving portions of the anterior limb and genu of the left internal capsule as well as the immediately adjacent left anterior lentiform nucleus, concerning for recent  infarct in this area. There is probable chronic small vessel disease in the lateral left thalamus. No other findings concerning for recent infarct seen on this study. Bony calvarium appears intact. The mastoid air cells are clear. There is mucosal thickening in each maxillary antrum. No intraorbital lesions are identified. IMPRESSION: Suspect recent infarct in the anterior left lentiform nucleus with involvement of a portion of the anterior aspect in genu of the left internal capsule. Prior infarct genu right internal capsule. There is atrophy with patchy periventricular small vessel disease, primarily anteriorly. Small vessel disease is also noted in the lateral left thalamus. No hemorrhage or mass effect. Electronically Signed   By: Lowella Grip III M.D.   On: 12/21/2015 12:09   Mr Jodene Nam Head Wo Contrast  12/21/2015  CLINICAL DATA:  Initial evaluation for acute confusion. EXAM: MRI HEAD WITHOUT CONTRAST MRA HEAD WITHOUT CONTRAST TECHNIQUE: Multiplanar, multiecho pulse sequences of the brain and surrounding structures were obtained without intravenous contrast. Angiographic images of the head were obtained using MRA technique without contrast. COMPARISON:  Prior CT from 12/21/2015. FINDINGS: MRI HEAD FINDINGS Cerebral volume within normal limits for patient age. Patchy and confluent T2/FLAIR hyperintensity within the periventricular and deep white matter most likely related to chronic small vessel ischemic disease, moderate in nature. Remote lacunar infarcts within the genu of the right internal capsule and left thalamus. There is abnormal restricted diffusion involving the left globus pallidus. Associated hypo intense signal intensity on the ADC map. Additional abnormal diffusion signal intensity present within the right globus pallidus as well, although this is slightly less prominent and signal intensity. Associated ADC signal intensity not definitely seen on the right. No intrinsic T1 signal intensity to  suggest hemorrhage. No significant mass effect. Foci of restricted diffusion to suggest acute infarct. Major intracranial vascular flow voids are maintained. Gray-white matter differentiation otherwise preserved. No mass lesion, midline shift, or mass effect. No hydrocephalus. No extra-axial fluid collection. Craniocervical junction normal. Pituitary gland within normal limits. No acute abnormality about the orbits. Scattered mucosal thickening within the ethmoidal air cells and maxillary sinuses. Small fluid level present within the left maxillary sinus. No mastoid effusion. Inner ear structures grossly normal. Bone marrow signal intensity normal. No scalp soft tissue abnormality. MRA HEAD FINDINGS ANTERIOR CIRCULATION: Visualized distal cervical segments of the internal carotid arteries are widely patent with antegrade flow. Petrous, cavernous, and supraclinoid segments are widely patent. A1 segments and anterior cerebral arteries well opacified. Right M1 segment widely patent without stenosis or occlusion. Right MCA bifurcation normal. Distal right MCA branches well opacified. Left M1 segment somewhat attenuated, felt to likely be at least partly related to motion on this  exam. There is a possible short segment mild to moderate stenosis within the distal left M1 segment (series 108, image 19). MCA bifurcation normal on the left. Left MCA branches well opacified distally. POSTERIOR CIRCULATION: Vertebral arteries patent to the vertebrobasilar junction. Posterior inferior cerebral arteries patent bilaterally. Basilar artery well opacified. Superior cerebellar arteries patent proximally. Both posterior cerebral arteries arise in the basilar artery and are well opacified to their distal aspects. No aneurysm. IMPRESSION: MRI HEAD IMPRESSION: 1. Abnormal diffusion abnormality within the bilateral lentiform nuclei (globus pallidi), left worse than right. Primary differential consideration includes toxic metabolic  derangement or anoxic injury, with carbon monoxide poisoning being the most classical example to cause this abnormality. 2. Moderate chronic small vessel ischemic disease with remote lacunar infarcts within the right internal capsule and left thalamus. MRA HEAD IMPRESSION: 1. No large or proximal arterial branch occlusion within the intracranial circulation. 2. Single short segment mild to moderate focal stenosis within the distal left M1 segment. Otherwise negative intracranial MRA. Electronically Signed   By: Jeannine Boga M.D.   On: 12/21/2015 22:31   Mr Brain Wo Contrast  12/21/2015  CLINICAL DATA:  Initial evaluation for acute confusion. EXAM: MRI HEAD WITHOUT CONTRAST MRA HEAD WITHOUT CONTRAST TECHNIQUE: Multiplanar, multiecho pulse sequences of the brain and surrounding structures were obtained without intravenous contrast. Angiographic images of the head were obtained using MRA technique without contrast. COMPARISON:  Prior CT from 12/21/2015. FINDINGS: MRI HEAD FINDINGS Cerebral volume within normal limits for patient age. Patchy and confluent T2/FLAIR hyperintensity within the periventricular and deep white matter most likely related to chronic small vessel ischemic disease, moderate in nature. Remote lacunar infarcts within the genu of the right internal capsule and left thalamus. There is abnormal restricted diffusion involving the left globus pallidus. Associated hypo intense signal intensity on the ADC map. Additional abnormal diffusion signal intensity present within the right globus pallidus as well, although this is slightly less prominent and signal intensity. Associated ADC signal intensity not definitely seen on the right. No intrinsic T1 signal intensity to suggest hemorrhage. No significant mass effect. Foci of restricted diffusion to suggest acute infarct. Major intracranial vascular flow voids are maintained. Gray-white matter differentiation otherwise preserved. No mass lesion,  midline shift, or mass effect. No hydrocephalus. No extra-axial fluid collection. Craniocervical junction normal. Pituitary gland within normal limits. No acute abnormality about the orbits. Scattered mucosal thickening within the ethmoidal air cells and maxillary sinuses. Small fluid level present within the left maxillary sinus. No mastoid effusion. Inner ear structures grossly normal. Bone marrow signal intensity normal. No scalp soft tissue abnormality. MRA HEAD FINDINGS ANTERIOR CIRCULATION: Visualized distal cervical segments of the internal carotid arteries are widely patent with antegrade flow. Petrous, cavernous, and supraclinoid segments are widely patent. A1 segments and anterior cerebral arteries well opacified. Right M1 segment widely patent without stenosis or occlusion. Right MCA bifurcation normal. Distal right MCA branches well opacified. Left M1 segment somewhat attenuated, felt to likely be at least partly related to motion on this exam. There is a possible short segment mild to moderate stenosis within the distal left M1 segment (series 108, image 19). MCA bifurcation normal on the left. Left MCA branches well opacified distally. POSTERIOR CIRCULATION: Vertebral arteries patent to the vertebrobasilar junction. Posterior inferior cerebral arteries patent bilaterally. Basilar artery well opacified. Superior cerebellar arteries patent proximally. Both posterior cerebral arteries arise in the basilar artery and are well opacified to their distal aspects. No aneurysm. IMPRESSION: MRI HEAD IMPRESSION: 1. Abnormal  diffusion abnormality within the bilateral lentiform nuclei (globus pallidi), left worse than right. Primary differential consideration includes toxic metabolic derangement or anoxic injury, with carbon monoxide poisoning being the most classical example to cause this abnormality. 2. Moderate chronic small vessel ischemic disease with remote lacunar infarcts within the right internal capsule  and left thalamus. MRA HEAD IMPRESSION: 1. No large or proximal arterial branch occlusion within the intracranial circulation. 2. Single short segment mild to moderate focal stenosis within the distal left M1 segment. Otherwise negative intracranial MRA. Electronically Signed   By: Jeannine Boga M.D.   On: 12/21/2015 22:31   US Abdomen Limited  12/21/2015  CLINICAL DATA:  Gallbladder history, mid epigastric pain for 1 day EXAM: US ABDOMEN LIMITED - RIGHT UPPER QUADRANT COMPARISON:  09/12/2015 ; correlation CT abdomen 09/12/2015 FINDINGS: Gallbladder: Distended by echogenic non shadowing material question sludge. No discrete shadowing calculi. Gallbladder wall upper normal thickness 3 mm thick. No sonographic Murphy sign or pericholecystic fluid. Common bile duct: Diameter: Normal caliber 5 mm diameter Liver: Slightly coarsened increased echogenicity which could be seen with fatty infiltration, cirrhosis and certain infiltrative disorders. Hepatic margins appear smooth without gross nodularity or focal mass. Hepatopetal portal venous flow. Liver did not appear cirrhotic by prior CT. No RIGHT upper quadrant ascites. IMPRESSION: Gallbladder filled with sludge with upper normal gallbladder wall thickness. Slightly coarsened increased echogenicity of the liver question fatty infiltration as above. Electronically Signed   By: Lavonia Dana M.D.   On: 12/21/2015 12:28   Medications:  I have reviewed the patient's current medications Scheduled Meds: .  stroke: mapping our early stages of recovery book   Does not apply Once  . aspirin EC  325 mg Oral Daily  . docusate sodium  200 mg Oral Daily  . enoxaparin (LOVENOX) injection  40 mg Subcutaneous Q24H  . metoprolol tartrate  12.5 mg Oral BID   Continuous Infusions: . sodium chloride 75 mL/hr at 12/21/15 1850   PRN Meds:.acetaminophen **OR** acetaminophen, senna-docusate   Assessment/Plan: #1. Altered mental status. Possible anoxic injury. Neurology  consultation pending. #2. Coronary artery disease. Asymptomatic. #3. Hypertension. #4. Right upper quadrant pain. Ultrasound reveals gallbladder sludge but no findings of acute cholecystitis. He has not had fever or leukocytosis. Principal Problem:   Altered mental status Active Problems:   Essential hypertension   HLD (hyperlipidemia)   Coronary artery disease involving native coronary artery   S/P CABG x 4   CVA (cerebral infarction)     LOS: 1 day   Tyler Mejia 12/22/2015, 7:47 AM

## 2015-12-22 NOTE — Evaluation (Signed)
Occupational Therapy Evaluation Patient Details Name: Tyler Mejia MRN: [id number] DOB: 03-Jul-1936 Today's Date: 12/22/2015    History of Present Illness Tyler Mejia is a 80 y.o. male with past medical history of hypertension, coronary artery disease status post CABG in 08/2015, was reportedly in his usual state of health when he reportedly became confused on 1/10. Respiratory confusion has progressively gotten worse. His behavior has been bizarre. For example, he was using his cell phone to try and change channels on the television and using the television on how to make calls. His family feels that he may have had some slurred speech. He did not have any difficulty walking. Denies any unilateral weakness or numbness. Denies any changes in vision or headache. He continues to be confused. He was brought to the ER for evaluation where CT scan of the head showed possible subacute infarct. He is being admitted for treatments.   Clinical Impression   Pt awake, alert, and oriented to person and place. Pt stated president as Tawni Pummel and month as August. Pt son present for part of evaluation, reports pt is "a world better than yesterday." Pt able to complete ADL tasks independently, may occasionally require cuing for sequencing. Discussed potential need for supervision with pt and son for near future. No further acute OT services required at this time.     Follow Up Recommendations  No OT follow up;Supervision/Assistance - 24 hour    Equipment Recommendations  None recommended by OT       Precautions / Restrictions Precautions Precautions: None Restrictions Weight Bearing Restrictions: No      Mobility Bed Mobility Overal bed mobility: Independent                Transfers Overall transfer level: Independent                         ADL Overall ADL's : Modified independent                                       General ADL Comments: Pt able to complete  all ADL tasks, cuing occasionally for sequencing     Vision Vision Assessment?: No apparent visual deficits          Pertinent Vitals/Pain Pain Assessment: No/denies pain     Hand Dominance Right   Extremity/Trunk Assessment Upper Extremity Assessment Upper Extremity Assessment: Overall WFL for tasks assessed   Lower Extremity Assessment Lower Extremity Assessment: Defer to PT evaluation       Communication Communication Communication: HOH   Cognition Arousal/Alertness: Awake/alert Behavior During Therapy: WFL for tasks assessed/performed Overall Cognitive Status: Impaired/Different from baseline Area of Impairment: Orientation;Memory Orientation Level: Disoriented to;Time   Memory: Decreased short-term memory                        Home Living Family/patient expects to be discharged to:: Private residence Living Arrangements: Alone Available Help at Discharge: Family               Bathroom Shower/Tub: Teacher, early years/pre: Standard     Home Equipment: None          Prior Functioning/Environment Level of Independence: Independent             OT Diagnosis: Altered mental status       End of Session  Activity Tolerance: Patient tolerated treatment well Patient left: in bed;with call bell/phone within reach;with family/visitor present   Time: [id number] OT Time Calculation (min): 24 min Charges:  OT General Charges $OT Visit: 1 Procedure OT Evaluation $OT Eval Low Complexity: 1 Procedure  Guadelupe Sabin, OTR/L  [phone]  12/22/2015, 9:20 AM

## 2015-12-22 NOTE — Clinical Documentation Improvement (Signed)
Hospitalist  Can the diagnosis of altered mental status be further specified?   Confusion/delirium (including drug induced)  Drowsiness/somnolence  Stupor/Semi-coma  Transient alteration of awareness  Encephalopathy - Alcoholic, Anoxic/Hypoxia, Drug Induced/Toxic (specify drug), Hepatic, Hypertensive, Hypoglycemic, Metabolic/Septic, Traumatic/post concussive, Wernicke, Other  Other  Clinically Undetermined  Document any associated diagnoses/conditions.   Supporting Information: AMS per 12/21/15 progress notes.   Please exercise your independent, professional judgment when responding. A specific answer is not anticipated or expected.   Thank You,  [place] [phone]

## 2015-12-22 NOTE — Progress Notes (Signed)
EEG Completed; Results Pending  

## 2015-12-22 NOTE — Evaluation (Signed)
Physical Therapy Evaluation Patient Details Name: Tyler Mejia MRN: [id number] DOB: January 23, 1936 Today's Date: 12/22/2015   History of Present Illness  Tyler Mejia is a 80 y.o. male with past medical history of hypertension, coronary artery disease status post CABG in 08/2015, was reportedly in his usual state of health when he reportedly became confused on 1/10. Respiratory confusion has progressively gotten worse. His behavior has been bizarre. For example, he was using his cell phone to try and change channels on the television and using the television on how to make calls. His family feels that he may have had some slurred speech. He did not have any difficulty walking. Denies any unilateral weakness or numbness. Denies any changes in vision or headache. He continues to be confused. He was brought to the ER for evaluation where CT scan of the head showed possible subacute infarct. He is being admitted for treatments.  MRI reports possible carbon monoxide poisoning.  Clinical Impression   Pt was seen for evaluation.  He continues to be mildly confused (thinks that he is currently in [place], [place]) but is able to converse and follow all directions.  The history he provides appears to be correct.  Evaluation reveals no functional abnormalities.  Strength,  Balance , gait are all WNL.  If his confusion does not resolve, the family will need to obtain some assistance for him or transition him to ACLF.  Otherwise, he has no PT needs.    Follow Up Recommendations No PT follow up    Equipment Recommendations  None recommended by PT    Recommendations for Other Services   none    Precautions / Restrictions Precautions Precautions: None Restrictions Weight Bearing Restrictions: No      Mobility  Bed Mobility Overal bed mobility: Independent                Transfers Overall transfer level: Independent                  Ambulation/Gait Ambulation/Gait assistance: Modified  independent (Device/Increase time) Ambulation Distance (Feet): 200 Feet Assistive device: None Gait Pattern/deviations: WFL(Within Functional Limits)   Gait velocity interpretation: >2.62 ft/sec, indicative of independent community Conservation officer, historic buildings Rankin (Stroke Patients Only)       Balance Overall balance assessment: Modified Independent                                           Pertinent Vitals/Pain Pain Assessment: No/denies pain    Home Living Family/patient expects to be discharged to:: Private residence Living Arrangements: Alone Available Help at Discharge: Family;Available PRN/intermittently Type of Home: House Home Access: Stairs to enter Entrance Stairs-Rails: None Entrance Stairs-Number of Steps: 4 Home Layout: Two level Home Equipment: None      Prior Function Level of Independence: Independent               Hand Dominance   Dominant Hand: Right    Extremity/Trunk Assessment   Upper Extremity Assessment: Defer to OT evaluation           Lower Extremity Assessment: Overall WFL for tasks assessed         Communication   Communication: HOH  Cognition Arousal/Alertness: Lethargic Behavior During Therapy: WFL for tasks assessed/performed Overall Cognitive Status: Impaired/Different  from baseline Area of Impairment: Orientation Orientation Level: Disoriented to;Place;Time   Memory: Decreased short-term memory              General Comments      Exercises        Assessment/Plan    PT Assessment Patent does not need any further PT services  PT Diagnosis     PT Problem List    PT Treatment Interventions     PT Goals (Current goals can be found in the Care Plan section) Acute Rehab PT Goals PT Goal Formulation: All assessment and education complete, DC therapy    Frequency     Barriers to discharge        Co-evaluation               End  of Session Equipment Utilized During Treatment: Gait belt Activity Tolerance: Patient tolerated treatment well Patient left: in bed;with call bell/phone within reach;with bed alarm set           Time: 1350-1417 PT Time Calculation (min) (ACUTE ONLY): 27 min   Charges:   PT Evaluation $PT Eval Low Complexity: 1 Procedure     PT G CodesDemetrios Isaacs L  PT 12/22/2015, 2:25 PM [phone]

## 2015-12-22 NOTE — Care Management Important Message (Signed)
Important Message  Patient Details  Name: Tyler Mejia MRN: [id number] Date of Birth: June 11, 1936   Medicare Important Message Given:  Yes    Alvie Heidelberg, RN 12/22/2015, 10:09 AM

## 2015-12-23 LAB — RPR: RPR: NONREACTIVE

## 2015-12-23 LAB — HEMOGLOBIN A1C
Hgb A1c MFr Bld: 6 % — ABNORMAL HIGH (ref 4.8–5.6)
MEAN PLASMA GLUCOSE: 126 mg/dL

## 2015-12-23 NOTE — Progress Notes (Signed)
Subjective: Royel continues to exhibit significant confusion. He has been evaluated by neurology. Case discussed with neurology. He has no complaints.  Objective: Vital signs in last 24 hours: Filed Vitals:   12/22/15 2027 12/22/15 2249 12/23/15 0249 12/23/15 0649  BP:  161/81 165/82 181/80  Pulse:  61 60 59  Temp:  98 F (36.7 C) 98.1 F (36.7 C) 97.5 F (36.4 C)  TempSrc:  Oral Oral Oral  Resp:  16 16 18   Height:      Weight:      SpO2: 93% 90% 96% 97%   Weight change:   Intake/Output Summary (Last 24 hours) at 12/23/15 1048 Last data filed at 12/23/15 0800  Gross per 24 hour  Intake    480 ml  Output    200 ml  Net    280 ml    Physical Exam: Alert. He is unable to tell me the correct month. He is unable to correctly describe his breakfast. He is aware that he is in [place]. Lungs clear. Heart regular with no murmurs. Abdomen nontender with no palpable organomegaly. Extremities reveal no edema. No focal weakness noted.  He has had assessments by physical therapy and occupational therapy.  Lab Results:    Results for orders placed or performed during the hospital encounter of 12/21/15 (from the past 24 hour(s))  Vitamin B12     Status: None   Collection Time: 12/22/15  3:27 PM  Result Value Ref Range   Vitamin B-12 315 180 - 914 pg/mL  TSH     Status: None   Collection Time: 12/22/15  3:27 PM  Result Value Ref Range   TSH 1.217 0.350 - 4.500 uIU/mL  RPR     Status: None   Collection Time: 12/22/15  3:27 PM  Result Value Ref Range   RPR Ser Ql Non Reactive Non Reactive  C-reactive protein     Status: None   Collection Time: 12/22/15  3:27 PM  Result Value Ref Range   CRP 0.7 <1.0 mg/dL  Sedimentation rate     Status: None   Collection Time: 12/22/15  3:27 PM  Result Value Ref Range   Sed Rate 7 0 - 16 mm/hr     ABGS No results for input(s): PHART, PO2ART, TCO2, HCO3 in the last 72 hours.  Invalid input(s): PCO2 CULTURES No results found for this  or any previous visit (from the past 240 hour(s)). Studies/Results: Dg Chest 2 View  12/21/2015  CLINICAL DATA:  Altered mental status. EXAM: CHEST - 2 VIEW COMPARISON:  Two-view chest x-ray 09/12/2015 FINDINGS: Heart is enlarged. There is no edema or effusion to suggest failure. Median sternotomy for CABG is noted. The lung volumes are low. Degenerative changes of the thoracic spine are stable. IMPRESSION: Stable cardiomegaly without failure. Electronically Signed   By: San Morelle M.D.   On: 12/21/2015 11:58   Ct Head Wo Contrast  12/21/2015  CLINICAL DATA:  Altered mental status for 1 week EXAM: CT HEAD WITHOUT CONTRAST TECHNIQUE: Contiguous axial images were obtained from the base of the skull through the vertex without intravenous contrast. COMPARISON:  None. FINDINGS: Mild diffuse atrophy is present. There is no intracranial mass, hemorrhage, extra-axial fluid collection, or midline shift. There is patchy small vessel disease throughout the anterior centra semiovale bilaterally. There is a prior infarct in the genu of the right internal capsule. There is ill-defined decreased attenuation involving portions of the anterior limb and genu of the left internal capsule as well  as the immediately adjacent left anterior lentiform nucleus, concerning for recent infarct in this area. There is probable chronic small vessel disease in the lateral left thalamus. No other findings concerning for recent infarct seen on this study. Bony calvarium appears intact. The mastoid air cells are clear. There is mucosal thickening in each maxillary antrum. No intraorbital lesions are identified. IMPRESSION: Suspect recent infarct in the anterior left lentiform nucleus with involvement of a portion of the anterior aspect in genu of the left internal capsule. Prior infarct genu right internal capsule. There is atrophy with patchy periventricular small vessel disease, primarily anteriorly. Small vessel disease is also  noted in the lateral left thalamus. No hemorrhage or mass effect. Electronically Signed   By: Lowella Grip III M.D.   On: 12/21/2015 12:09   Mr Jodene Nam Head Wo Contrast  12/21/2015  CLINICAL DATA:  Initial evaluation for acute confusion. EXAM: MRI HEAD WITHOUT CONTRAST MRA HEAD WITHOUT CONTRAST TECHNIQUE: Multiplanar, multiecho pulse sequences of the brain and surrounding structures were obtained without intravenous contrast. Angiographic images of the head were obtained using MRA technique without contrast. COMPARISON:  Prior CT from 12/21/2015. FINDINGS: MRI HEAD FINDINGS Cerebral volume within normal limits for patient age. Patchy and confluent T2/FLAIR hyperintensity within the periventricular and deep white matter most likely related to chronic small vessel ischemic disease, moderate in nature. Remote lacunar infarcts within the genu of the right internal capsule and left thalamus. There is abnormal restricted diffusion involving the left globus pallidus. Associated hypo intense signal intensity on the ADC map. Additional abnormal diffusion signal intensity present within the right globus pallidus as well, although this is slightly less prominent and signal intensity. Associated ADC signal intensity not definitely seen on the right. No intrinsic T1 signal intensity to suggest hemorrhage. No significant mass effect. Foci of restricted diffusion to suggest acute infarct. Major intracranial vascular flow voids are maintained. Gray-white matter differentiation otherwise preserved. No mass lesion, midline shift, or mass effect. No hydrocephalus. No extra-axial fluid collection. Craniocervical junction normal. Pituitary gland within normal limits. No acute abnormality about the orbits. Scattered mucosal thickening within the ethmoidal air cells and maxillary sinuses. Small fluid level present within the left maxillary sinus. No mastoid effusion. Inner ear structures grossly normal. Bone marrow signal intensity  normal. No scalp soft tissue abnormality. MRA HEAD FINDINGS ANTERIOR CIRCULATION: Visualized distal cervical segments of the internal carotid arteries are widely patent with antegrade flow. Petrous, cavernous, and supraclinoid segments are widely patent. A1 segments and anterior cerebral arteries well opacified. Right M1 segment widely patent without stenosis or occlusion. Right MCA bifurcation normal. Distal right MCA branches well opacified. Left M1 segment somewhat attenuated, felt to likely be at least partly related to motion on this exam. There is a possible short segment mild to moderate stenosis within the distal left M1 segment (series 108, image 19). MCA bifurcation normal on the left. Left MCA branches well opacified distally. POSTERIOR CIRCULATION: Vertebral arteries patent to the vertebrobasilar junction. Posterior inferior cerebral arteries patent bilaterally. Basilar artery well opacified. Superior cerebellar arteries patent proximally. Both posterior cerebral arteries arise in the basilar artery and are well opacified to their distal aspects. No aneurysm. IMPRESSION: MRI HEAD IMPRESSION: 1. Abnormal diffusion abnormality within the bilateral lentiform nuclei (globus pallidi), left worse than right. Primary differential consideration includes toxic metabolic derangement or anoxic injury, with carbon monoxide poisoning being the most classical example to cause this abnormality. 2. Moderate chronic small vessel ischemic disease with remote lacunar infarcts within the  right internal capsule and left thalamus. MRA HEAD IMPRESSION: 1. No large or proximal arterial branch occlusion within the intracranial circulation. 2. Single short segment mild to moderate focal stenosis within the distal left M1 segment. Otherwise negative intracranial MRA. Electronically Signed   By: Jeannine Boga M.D.   On: 12/21/2015 22:31   Mr Brain Wo Contrast  12/21/2015  CLINICAL DATA:  Initial evaluation for acute  confusion. EXAM: MRI HEAD WITHOUT CONTRAST MRA HEAD WITHOUT CONTRAST TECHNIQUE: Multiplanar, multiecho pulse sequences of the brain and surrounding structures were obtained without intravenous contrast. Angiographic images of the head were obtained using MRA technique without contrast. COMPARISON:  Prior CT from 12/21/2015. FINDINGS: MRI HEAD FINDINGS Cerebral volume within normal limits for patient age. Patchy and confluent T2/FLAIR hyperintensity within the periventricular and deep white matter most likely related to chronic small vessel ischemic disease, moderate in nature. Remote lacunar infarcts within the genu of the right internal capsule and left thalamus. There is abnormal restricted diffusion involving the left globus pallidus. Associated hypo intense signal intensity on the ADC map. Additional abnormal diffusion signal intensity present within the right globus pallidus as well, although this is slightly less prominent and signal intensity. Associated ADC signal intensity not definitely seen on the right. No intrinsic T1 signal intensity to suggest hemorrhage. No significant mass effect. Foci of restricted diffusion to suggest acute infarct. Major intracranial vascular flow voids are maintained. Gray-white matter differentiation otherwise preserved. No mass lesion, midline shift, or mass effect. No hydrocephalus. No extra-axial fluid collection. Craniocervical junction normal. Pituitary gland within normal limits. No acute abnormality about the orbits. Scattered mucosal thickening within the ethmoidal air cells and maxillary sinuses. Small fluid level present within the left maxillary sinus. No mastoid effusion. Inner ear structures grossly normal. Bone marrow signal intensity normal. No scalp soft tissue abnormality. MRA HEAD FINDINGS ANTERIOR CIRCULATION: Visualized distal cervical segments of the internal carotid arteries are widely patent with antegrade flow. Petrous, cavernous, and supraclinoid  segments are widely patent. A1 segments and anterior cerebral arteries well opacified. Right M1 segment widely patent without stenosis or occlusion. Right MCA bifurcation normal. Distal right MCA branches well opacified. Left M1 segment somewhat attenuated, felt to likely be at least partly related to motion on this exam. There is a possible short segment mild to moderate stenosis within the distal left M1 segment (series 108, image 19). MCA bifurcation normal on the left. Left MCA branches well opacified distally. POSTERIOR CIRCULATION: Vertebral arteries patent to the vertebrobasilar junction. Posterior inferior cerebral arteries patent bilaterally. Basilar artery well opacified. Superior cerebellar arteries patent proximally. Both posterior cerebral arteries arise in the basilar artery and are well opacified to their distal aspects. No aneurysm. IMPRESSION: MRI HEAD IMPRESSION: 1. Abnormal diffusion abnormality within the bilateral lentiform nuclei (globus pallidi), left worse than right. Primary differential consideration includes toxic metabolic derangement or anoxic injury, with carbon monoxide poisoning being the most classical example to cause this abnormality. 2. Moderate chronic small vessel ischemic disease with remote lacunar infarcts within the right internal capsule and left thalamus. MRA HEAD IMPRESSION: 1. No large or proximal arterial branch occlusion within the intracranial circulation. 2. Single short segment mild to moderate focal stenosis within the distal left M1 segment. Otherwise negative intracranial MRA. Electronically Signed   By: Jeannine Boga M.D.   On: 12/21/2015 22:31   US Carotid Bilateral  12/22/2015  CLINICAL DATA:  CVA. EXAM: BILATERAL CAROTID DUPLEX ULTRASOUND TECHNIQUE: Pearline Cables scale imaging, color Doppler and duplex ultrasound were performed  of bilateral carotid and vertebral arteries in the neck. COMPARISON:  MR 12/21/2015. FINDINGS: Criteria: Quantification of carotid  stenosis is based on velocity parameters that correlate the residual internal carotid diameter with NASCET-based stenosis levels, using the diameter of the distal internal carotid lumen as the denominator for stenosis measurement. The following velocity measurements were obtained: RIGHT ICA:  73/28 cm/sec CCA:  123456 cm/sec SYSTOLIC ICA/CCA RATIO:  0.8 DIASTOLIC ICA/CCA RATIO:  1.7 ECA:  69 cm/sec LEFT ICA:  83/24 cm/sec CCA:  99991111 cm/sec SYSTOLIC ICA/CCA RATIO:  1.3 DIASTOLIC ICA/CCA RATIO:  2.0 ECA:  92 cm/sec RIGHT CAROTID ARTERY: Mild right carotid bifurcation plaque. No flow limiting stenosis. RIGHT VERTEBRAL ARTERY:  Patent with antegrade flow. LEFT CAROTID ARTERY:  Mild left carotid bifurcation plaque. LEFT VERTEBRAL ARTERY:  Patent with antegrade flow. IMPRESSION: 1. Mild bilateral carotid bifurcation plaque. No flow limiting stenosis. Degree of stenosis less than 50%. 2. Vertebral arteries are patent with antegrade flow. Electronically Signed   By: Marcello Moores  Register   On: 12/22/2015 13:52   US Abdomen Limited  12/21/2015  CLINICAL DATA:  Gallbladder history, mid epigastric pain for 1 day EXAM: US ABDOMEN LIMITED - RIGHT UPPER QUADRANT COMPARISON:  09/12/2015 ; correlation CT abdomen 09/12/2015 FINDINGS: Gallbladder: Distended by echogenic non shadowing material question sludge. No discrete shadowing calculi. Gallbladder wall upper normal thickness 3 mm thick. No sonographic Murphy sign or pericholecystic fluid. Common bile duct: Diameter: Normal caliber 5 mm diameter Liver: Slightly coarsened increased echogenicity which could be seen with fatty infiltration, cirrhosis and certain infiltrative disorders. Hepatic margins appear smooth without gross nodularity or focal mass. Hepatopetal portal venous flow. Liver did not appear cirrhotic by prior CT. No RIGHT upper quadrant ascites. IMPRESSION: Gallbladder filled with sludge with upper normal gallbladder wall thickness. Slightly coarsened increased  echogenicity of the liver question fatty infiltration as above. Electronically Signed   By: Lavonia Dana M.D.   On: 12/21/2015 12:28   Micro Results: No results found for this or any previous visit (from the past 240 hour(s)). Studies/Results: Dg Chest 2 View  12/21/2015  CLINICAL DATA:  Altered mental status. EXAM: CHEST - 2 VIEW COMPARISON:  Two-view chest x-ray 09/12/2015 FINDINGS: Heart is enlarged. There is no edema or effusion to suggest failure. Median sternotomy for CABG is noted. The lung volumes are low. Degenerative changes of the thoracic spine are stable. IMPRESSION: Stable cardiomegaly without failure. Electronically Signed   By: San Morelle M.D.   On: 12/21/2015 11:58   Ct Head Wo Contrast  12/21/2015  CLINICAL DATA:  Altered mental status for 1 week EXAM: CT HEAD WITHOUT CONTRAST TECHNIQUE: Contiguous axial images were obtained from the base of the skull through the vertex without intravenous contrast. COMPARISON:  None. FINDINGS: Mild diffuse atrophy is present. There is no intracranial mass, hemorrhage, extra-axial fluid collection, or midline shift. There is patchy small vessel disease throughout the anterior centra semiovale bilaterally. There is a prior infarct in the genu of the right internal capsule. There is ill-defined decreased attenuation involving portions of the anterior limb and genu of the left internal capsule as well as the immediately adjacent left anterior lentiform nucleus, concerning for recent infarct in this area. There is probable chronic small vessel disease in the lateral left thalamus. No other findings concerning for recent infarct seen on this study. Bony calvarium appears intact. The mastoid air cells are clear. There is mucosal thickening in each maxillary antrum. No intraorbital lesions are identified. IMPRESSION: Suspect recent infarct in  the anterior left lentiform nucleus with involvement of a portion of the anterior aspect in genu of the left  internal capsule. Prior infarct genu right internal capsule. There is atrophy with patchy periventricular small vessel disease, primarily anteriorly. Small vessel disease is also noted in the lateral left thalamus. No hemorrhage or mass effect. Electronically Signed   By: Lowella Grip III M.D.   On: 12/21/2015 12:09   Mr Jodene Nam Head Wo Contrast  12/21/2015  CLINICAL DATA:  Initial evaluation for acute confusion. EXAM: MRI HEAD WITHOUT CONTRAST MRA HEAD WITHOUT CONTRAST TECHNIQUE: Multiplanar, multiecho pulse sequences of the brain and surrounding structures were obtained without intravenous contrast. Angiographic images of the head were obtained using MRA technique without contrast. COMPARISON:  Prior CT from 12/21/2015. FINDINGS: MRI HEAD FINDINGS Cerebral volume within normal limits for patient age. Patchy and confluent T2/FLAIR hyperintensity within the periventricular and deep white matter most likely related to chronic small vessel ischemic disease, moderate in nature. Remote lacunar infarcts within the genu of the right internal capsule and left thalamus. There is abnormal restricted diffusion involving the left globus pallidus. Associated hypo intense signal intensity on the ADC map. Additional abnormal diffusion signal intensity present within the right globus pallidus as well, although this is slightly less prominent and signal intensity. Associated ADC signal intensity not definitely seen on the right. No intrinsic T1 signal intensity to suggest hemorrhage. No significant mass effect. Foci of restricted diffusion to suggest acute infarct. Major intracranial vascular flow voids are maintained. Gray-white matter differentiation otherwise preserved. No mass lesion, midline shift, or mass effect. No hydrocephalus. No extra-axial fluid collection. Craniocervical junction normal. Pituitary gland within normal limits. No acute abnormality about the orbits. Scattered mucosal thickening within the ethmoidal  air cells and maxillary sinuses. Small fluid level present within the left maxillary sinus. No mastoid effusion. Inner ear structures grossly normal. Bone marrow signal intensity normal. No scalp soft tissue abnormality. MRA HEAD FINDINGS ANTERIOR CIRCULATION: Visualized distal cervical segments of the internal carotid arteries are widely patent with antegrade flow. Petrous, cavernous, and supraclinoid segments are widely patent. A1 segments and anterior cerebral arteries well opacified. Right M1 segment widely patent without stenosis or occlusion. Right MCA bifurcation normal. Distal right MCA branches well opacified. Left M1 segment somewhat attenuated, felt to likely be at least partly related to motion on this exam. There is a possible short segment mild to moderate stenosis within the distal left M1 segment (series 108, image 19). MCA bifurcation normal on the left. Left MCA branches well opacified distally. POSTERIOR CIRCULATION: Vertebral arteries patent to the vertebrobasilar junction. Posterior inferior cerebral arteries patent bilaterally. Basilar artery well opacified. Superior cerebellar arteries patent proximally. Both posterior cerebral arteries arise in the basilar artery and are well opacified to their distal aspects. No aneurysm. IMPRESSION: MRI HEAD IMPRESSION: 1. Abnormal diffusion abnormality within the bilateral lentiform nuclei (globus pallidi), left worse than right. Primary differential consideration includes toxic metabolic derangement or anoxic injury, with carbon monoxide poisoning being the most classical example to cause this abnormality. 2. Moderate chronic small vessel ischemic disease with remote lacunar infarcts within the right internal capsule and left thalamus. MRA HEAD IMPRESSION: 1. No large or proximal arterial branch occlusion within the intracranial circulation. 2. Single short segment mild to moderate focal stenosis within the distal left M1 segment. Otherwise negative  intracranial MRA. Electronically Signed   By: Jeannine Boga M.D.   On: 12/21/2015 22:31   Mr Brain Wo Contrast  12/21/2015  CLINICAL DATA:  Initial evaluation for acute confusion. EXAM: MRI HEAD WITHOUT CONTRAST MRA HEAD WITHOUT CONTRAST TECHNIQUE: Multiplanar, multiecho pulse sequences of the brain and surrounding structures were obtained without intravenous contrast. Angiographic images of the head were obtained using MRA technique without contrast. COMPARISON:  Prior CT from 12/21/2015. FINDINGS: MRI HEAD FINDINGS Cerebral volume within normal limits for patient age. Patchy and confluent T2/FLAIR hyperintensity within the periventricular and deep white matter most likely related to chronic small vessel ischemic disease, moderate in nature. Remote lacunar infarcts within the genu of the right internal capsule and left thalamus. There is abnormal restricted diffusion involving the left globus pallidus. Associated hypo intense signal intensity on the ADC map. Additional abnormal diffusion signal intensity present within the right globus pallidus as well, although this is slightly less prominent and signal intensity. Associated ADC signal intensity not definitely seen on the right. No intrinsic T1 signal intensity to suggest hemorrhage. No significant mass effect. Foci of restricted diffusion to suggest acute infarct. Major intracranial vascular flow voids are maintained. Gray-white matter differentiation otherwise preserved. No mass lesion, midline shift, or mass effect. No hydrocephalus. No extra-axial fluid collection. Craniocervical junction normal. Pituitary gland within normal limits. No acute abnormality about the orbits. Scattered mucosal thickening within the ethmoidal air cells and maxillary sinuses. Small fluid level present within the left maxillary sinus. No mastoid effusion. Inner ear structures grossly normal. Bone marrow signal intensity normal. No scalp soft tissue abnormality. MRA HEAD  FINDINGS ANTERIOR CIRCULATION: Visualized distal cervical segments of the internal carotid arteries are widely patent with antegrade flow. Petrous, cavernous, and supraclinoid segments are widely patent. A1 segments and anterior cerebral arteries well opacified. Right M1 segment widely patent without stenosis or occlusion. Right MCA bifurcation normal. Distal right MCA branches well opacified. Left M1 segment somewhat attenuated, felt to likely be at least partly related to motion on this exam. There is a possible short segment mild to moderate stenosis within the distal left M1 segment (series 108, image 19). MCA bifurcation normal on the left. Left MCA branches well opacified distally. POSTERIOR CIRCULATION: Vertebral arteries patent to the vertebrobasilar junction. Posterior inferior cerebral arteries patent bilaterally. Basilar artery well opacified. Superior cerebellar arteries patent proximally. Both posterior cerebral arteries arise in the basilar artery and are well opacified to their distal aspects. No aneurysm. IMPRESSION: MRI HEAD IMPRESSION: 1. Abnormal diffusion abnormality within the bilateral lentiform nuclei (globus pallidi), left worse than right. Primary differential consideration includes toxic metabolic derangement or anoxic injury, with carbon monoxide poisoning being the most classical example to cause this abnormality. 2. Moderate chronic small vessel ischemic disease with remote lacunar infarcts within the right internal capsule and left thalamus. MRA HEAD IMPRESSION: 1. No large or proximal arterial branch occlusion within the intracranial circulation. 2. Single short segment mild to moderate focal stenosis within the distal left M1 segment. Otherwise negative intracranial MRA. Electronically Signed   By: Jeannine Boga M.D.   On: 12/21/2015 22:31   US Carotid Bilateral  12/22/2015  CLINICAL DATA:  CVA. EXAM: BILATERAL CAROTID DUPLEX ULTRASOUND TECHNIQUE: Pearline Cables scale imaging, color  Doppler and duplex ultrasound were performed of bilateral carotid and vertebral arteries in the neck. COMPARISON:  MR 12/21/2015. FINDINGS: Criteria: Quantification of carotid stenosis is based on velocity parameters that correlate the residual internal carotid diameter with NASCET-based stenosis levels, using the diameter of the distal internal carotid lumen as the denominator for stenosis measurement. The following velocity measurements were obtained: RIGHT ICA:  73/28 cm/sec CCA:  123456 cm/sec SYSTOLIC  ICA/CCA RATIO:  0.8 DIASTOLIC ICA/CCA RATIO:  1.7 ECA:  69 cm/sec LEFT ICA:  83/24 cm/sec CCA:  99991111 cm/sec SYSTOLIC ICA/CCA RATIO:  1.3 DIASTOLIC ICA/CCA RATIO:  2.0 ECA:  92 cm/sec RIGHT CAROTID ARTERY: Mild right carotid bifurcation plaque. No flow limiting stenosis. RIGHT VERTEBRAL ARTERY:  Patent with antegrade flow. LEFT CAROTID ARTERY:  Mild left carotid bifurcation plaque. LEFT VERTEBRAL ARTERY:  Patent with antegrade flow. IMPRESSION: 1. Mild bilateral carotid bifurcation plaque. No flow limiting stenosis. Degree of stenosis less than 50%. 2. Vertebral arteries are patent with antegrade flow. Electronically Signed   By: Marcello Moores  Register   On: 12/22/2015 13:52   US Abdomen Limited  12/21/2015  CLINICAL DATA:  Gallbladder history, mid epigastric pain for 1 day EXAM: US ABDOMEN LIMITED - RIGHT UPPER QUADRANT COMPARISON:  09/12/2015 ; correlation CT abdomen 09/12/2015 FINDINGS: Gallbladder: Distended by echogenic non shadowing material question sludge. No discrete shadowing calculi. Gallbladder wall upper normal thickness 3 mm thick. No sonographic Murphy sign or pericholecystic fluid. Common bile duct: Diameter: Normal caliber 5 mm diameter Liver: Slightly coarsened increased echogenicity which could be seen with fatty infiltration, cirrhosis and certain infiltrative disorders. Hepatic margins appear smooth without gross nodularity or focal mass. Hepatopetal portal venous flow. Liver did not appear  cirrhotic by prior CT. No RIGHT upper quadrant ascites. IMPRESSION: Gallbladder filled with sludge with upper normal gallbladder wall thickness. Slightly coarsened increased echogenicity of the liver question fatty infiltration as above. Electronically Signed   By: Lavonia Dana M.D.   On: 12/21/2015 12:28   Medications:  I have reviewed the patient's current medications Scheduled Meds: .  stroke: mapping our early stages of recovery book   Does not apply Once  . aspirin EC  325 mg Oral Daily  . docusate sodium  200 mg Oral Daily  . enoxaparin (LOVENOX) injection  40 mg Subcutaneous Q24H  . metoprolol tartrate  12.5 mg Oral BID   Continuous Infusions:  PRN Meds:.acetaminophen **OR** acetaminophen, senna-docusate   Assessment/Plan: #1. Encephalopathy. Possible anoxic injury based on MRI. EEG pending. LP pending. Further evaluation per neurology. Sedimentation rate and CRP normal. He is euthyroid. Vitamin B-12 level was normal. RPR is nonreactive. Discussed with family. Daughter and grandson are within this morning. #2. Coronary artery disease. Asymptomatic. #3. Hypertension. Continue metoprolol. Principal Problem:   Altered mental status Active Problems:   Essential hypertension   HLD (hyperlipidemia)   Coronary artery disease involving native coronary artery   S/P CABG x 4   CVA (cerebral infarction)     LOS: 2 days   Bellarae Lizer 12/23/2015, 10:48 AM

## 2015-12-23 NOTE — Procedures (Signed)
  [place] A. Merlene Laughter, MD     [email or web address]           HISTORY: The patient presents with confusion. The study is being done to evaluate for possible complex partial seizures and other epileptiform discharges.  MEDICATIONS: Scheduled Meds: .  stroke: mapping our early stages of recovery book   Does not apply Once  . aspirin EC  325 mg Oral Daily  . docusate sodium  200 mg Oral Daily  . enoxaparin (LOVENOX) injection  40 mg Subcutaneous Q24H  . metoprolol tartrate  12.5 mg Oral BID   Continuous Infusions:  PRN Meds:.acetaminophen **OR** acetaminophen, senna-docusate  Prior to Admission medications   Medication Sig Start Date End Date Taking? Authorizing Provider  acetaminophen (TYLENOL) 325 MG tablet Take 2 tablets (650 mg total) by mouth every 6 (six) hours as needed for mild pain, fever or headache. 08/24/15  Yes Erin R Barrett, PA-C  aspirin EC 325 MG EC tablet Take 1 tablet (325 mg total) by mouth daily. 08/24/15  Yes Erin R Barrett, PA-C  docusate sodium (COLACE) 100 MG capsule Take 2 capsules (200 mg total) by mouth daily. 08/24/15  Yes Erin R Barrett, PA-C  metoprolol tartrate (LOPRESSOR) 25 MG tablet Take 0.5 tablets (12.5 mg total) by mouth 2 (two) times daily. 08/24/15  Yes Erin R Barrett, PA-C  pantoprazole (PROTONIX) 40 MG tablet Take 40 mg by mouth daily.  10/04/15  Yes Historical Provider, MD  sildenafil (REVATIO) 20 MG tablet Take 60-100 mg by mouth daily as needed.   Yes Historical Provider, MD  ezetimibe (ZETIA) 10 MG tablet Take 1 tablet (10 mg total) by mouth daily. Patient not taking: Reported on 12/21/2015 10/25/15   [name], MD      ANALYSIS: A 16 channel recording using standard 10 20 measurements is conducted for 21 minutes. There is a well-formed posterior dominant rhythm of 8-1/2 Hz which attenuates with eye opening. There is beta activity observed in the frontal areas. Awake and some sleep activities are observed.  Rudimentary K complexes and sleep spindles are recorded. Photic stimulation and hyperventilation were not carried out. There is no focal or lateral slowing. There is no epileptiform activity is observed.   IMPRESSION: This is a normal recording of awake and sleep states.      Roanna Reaves A. Merlene Laughter, M.D.  Diplomate, Tax adviser of Psychiatry and Neurology ( Neurology).

## 2015-12-23 NOTE — Progress Notes (Signed)
Patient ambulated halls with stand by assist.  Tolerated well.  Appears steady.  Now in chair resting.  Call bell within reach and family at bedside.

## 2015-12-24 LAB — HIV ANTIBODY (ROUTINE TESTING W REFLEX): HIV SCREEN 4TH GENERATION: NONREACTIVE

## 2015-12-24 NOTE — Progress Notes (Signed)
Subjective: Terance has continued to exhibit confusion. He had hallucinations yesterday. He was seeing chickens. He has been able to ambulate with assistance. He is eating without difficulty. He is having difficulty with writing and remembering his phone number and address. He has had no fever.  Objective: Vital signs in last 24 hours: Filed Vitals:   12/23/15 1849 12/23/15 2249 12/24/15 0249 12/24/15 0649  BP: 137/113 155/69 135/72 149/67  Pulse: 70 62 63 62  Temp: 97.8 F (36.6 C) 97 F (36.1 C) 97.6 F (36.4 C) 97.8 F (36.6 C)  TempSrc:  Oral Oral Oral  Resp: 18 17 18 18   Height:      Weight:      SpO2: 96% 97% 96% 94%   Weight change:   Intake/Output Summary (Last 24 hours) at 12/24/15 1048 Last data filed at 12/23/15 1800  Gross per 24 hour  Intake    480 ml  Output      0 ml  Net    480 ml    Physical Exam: Confused. He tells me he is in the hospital in [place]. He tells me the month is February. He is unable to correctly described his breakfast. Speech intact. Face symmetric. Lungs clear. Heart regular with no murmurs. Abdomen soft and nontender. Extremities reveal no edema. No focal weakness identified.  Lab Results:   No results found for this or any previous visit (from the past 24 hour(s)).   ABGS No results for input(s): PHART, PO2ART, TCO2, HCO3 in the last 72 hours.  Invalid input(s): PCO2 CULTURES No results found for this or any previous visit (from the past 240 hour(s)). Studies/Results: US Carotid Bilateral  12/22/2015  CLINICAL DATA:  CVA. EXAM: BILATERAL CAROTID DUPLEX ULTRASOUND TECHNIQUE: Pearline Cables scale imaging, color Doppler and duplex ultrasound were performed of bilateral carotid and vertebral arteries in the neck. COMPARISON:  MR 12/21/2015. FINDINGS: Criteria: Quantification of carotid stenosis is based on velocity parameters that correlate the residual internal carotid diameter with NASCET-based stenosis levels, using the diameter of the distal  internal carotid lumen as the denominator for stenosis measurement. The following velocity measurements were obtained: RIGHT ICA:  73/28 cm/sec CCA:  123456 cm/sec SYSTOLIC ICA/CCA RATIO:  0.8 DIASTOLIC ICA/CCA RATIO:  1.7 ECA:  69 cm/sec LEFT ICA:  83/24 cm/sec CCA:  99991111 cm/sec SYSTOLIC ICA/CCA RATIO:  1.3 DIASTOLIC ICA/CCA RATIO:  2.0 ECA:  92 cm/sec RIGHT CAROTID ARTERY: Mild right carotid bifurcation plaque. No flow limiting stenosis. RIGHT VERTEBRAL ARTERY:  Patent with antegrade flow. LEFT CAROTID ARTERY:  Mild left carotid bifurcation plaque. LEFT VERTEBRAL ARTERY:  Patent with antegrade flow. IMPRESSION: 1. Mild bilateral carotid bifurcation plaque. No flow limiting stenosis. Degree of stenosis less than 50%. 2. Vertebral arteries are patent with antegrade flow. Electronically Signed   By: Marcello Moores  Register   On: 12/22/2015 13:52   Micro Results: No results found for this or any previous visit (from the past 240 hour(s)). Studies/Results: US Carotid Bilateral  12/22/2015  CLINICAL DATA:  CVA. EXAM: BILATERAL CAROTID DUPLEX ULTRASOUND TECHNIQUE: Pearline Cables scale imaging, color Doppler and duplex ultrasound were performed of bilateral carotid and vertebral arteries in the neck. COMPARISON:  MR 12/21/2015. FINDINGS: Criteria: Quantification of carotid stenosis is based on velocity parameters that correlate the residual internal carotid diameter with NASCET-based stenosis levels, using the diameter of the distal internal carotid lumen as the denominator for stenosis measurement. The following velocity measurements were obtained: RIGHT ICA:  73/28 cm/sec CCA:  123456 cm/sec SYSTOLIC ICA/CCA RATIO:  0.8 DIASTOLIC ICA/CCA RATIO:  1.7 ECA:  69 cm/sec LEFT ICA:  83/24 cm/sec CCA:  99991111 cm/sec SYSTOLIC ICA/CCA RATIO:  1.3 DIASTOLIC ICA/CCA RATIO:  2.0 ECA:  92 cm/sec RIGHT CAROTID ARTERY: Mild right carotid bifurcation plaque. No flow limiting stenosis. RIGHT VERTEBRAL ARTERY:  Patent with antegrade flow. LEFT  CAROTID ARTERY:  Mild left carotid bifurcation plaque. LEFT VERTEBRAL ARTERY:  Patent with antegrade flow. IMPRESSION: 1. Mild bilateral carotid bifurcation plaque. No flow limiting stenosis. Degree of stenosis less than 50%. 2. Vertebral arteries are patent with antegrade flow. Electronically Signed   By: Marcello Moores  Register   On: 12/22/2015 13:52   Medications:  I have reviewed the patient's current medications Scheduled Meds: .  stroke: mapping our early stages of recovery book   Does not apply Once  . docusate sodium  200 mg Oral Daily  . metoprolol tartrate  12.5 mg Oral BID   Continuous Infusions:  PRN Meds:.acetaminophen **OR** acetaminophen, senna-docusate   Assessment/Plan: #1. Encephalopathy. Etiology unclear. MRI abnormalities again discussed with his 2 daughters and son. EEG is normal. Await LP and further assessment by neurology tomorrow.  HIV negative. #2. Coronary artery disease. #3. Hypertension.  #4. Remote lacunar infarcts. Principal Problem:   Altered mental status Active Problems:   Essential hypertension   HLD (hyperlipidemia)   Coronary artery disease involving native coronary artery   S/P CABG x 4   CVA (cerebral infarction)     LOS: 3 days   Taz Vanness 12/24/2015, 10:48 AM

## 2015-12-25 ENCOUNTER — Inpatient Hospital Stay (HOSPITAL_COMMUNITY): Payer: Medicare Other

## 2015-12-25 LAB — CSF CELL COUNT WITH DIFFERENTIAL
RBC Count, CSF: 430 /mm3 — ABNORMAL HIGH
Tube #: 3
WBC, CSF: 8 /mm3 — ABNORMAL HIGH (ref 0–5)

## 2015-12-25 LAB — PROTEIN, CSF: TOTAL PROTEIN, CSF: 83 mg/dL — AB (ref 15–45)

## 2015-12-25 LAB — GLUCOSE, CSF: Glucose, CSF: 63 mg/dL (ref 40–70)

## 2015-12-25 LAB — ANTINUCLEAR ANTIBODIES, IFA: ANTINUCLEAR ANTIBODIES, IFA: NEGATIVE

## 2015-12-25 LAB — HOMOCYSTEINE: HOMOCYSTEINE-NORM: 12.5 umol/L (ref 0.0–15.0)

## 2015-12-25 MED ORDER — CYANOCOBALAMIN 1000 MCG/ML IJ SOLN
1000.0000 ug | Freq: Once | INTRAMUSCULAR | Status: AC
  Administered 2015-12-26: 1000 ug via INTRAMUSCULAR
  Filled 2015-12-25: qty 1

## 2015-12-25 MED ORDER — ACYCLOVIR SODIUM 50 MG/ML IV SOLN
INTRAVENOUS | Status: AC
  Filled 2015-12-25: qty 20

## 2015-12-25 MED ORDER — DEXTROSE 5 % IV SOLN
1000.0000 mg | Freq: Three times a day (TID) | INTRAVENOUS | Status: DC
  Administered 2015-12-26 (×3): 1000 mg via INTRAVENOUS
  Filled 2015-12-25 (×6): qty 20

## 2015-12-25 NOTE — Progress Notes (Signed)
Subjective: Tyler Mejia is awake and alert. He remains confused. He tells me he is in the hospital in [place]. He again tells me the month is February. He has had no fever.  Objective: Vital signs in last 24 hours: Filed Vitals:   12/24/15 2203 12/24/15 2249 12/25/15 0249 12/25/15 0649  BP:  136/71 146/74 120/68  Pulse: 69 80 78 66  Temp:  98.2 F (36.8 C) 98.4 F (36.9 C) 98.5 F (36.9 C)  TempSrc:  Oral Oral Oral  Resp: 16 16 18 18   Height:      Weight:      SpO2: 95% 95% 96% 95%   Weight change:   Intake/Output Summary (Last 24 hours) at 12/25/15 0720 Last data filed at 12/24/15 1800  Gross per 24 hour  Intake    720 ml  Output      0 ml  Net    720 ml    Physical Exam: Neuro unchanged. Lungs clear. Heart regular with no murmurs. Abdomen is soft and nontender with no palpable organomegaly. No focal weakness.  Lab Results:   No results found for this or any previous visit (from the past 24 hour(s)).   ABGS No results for input(s): PHART, PO2ART, TCO2, HCO3 in the last 72 hours.  Invalid input(s): PCO2 CULTURES No results found for this or any previous visit (from the past 240 hour(s)). Studies/Results: No results found. Micro Results: No results found for this or any previous visit (from the past 240 hour(s)). Studies/Results: No results found. Medications:  I have reviewed the patient's current medications Scheduled Meds: .  stroke: mapping our early stages of recovery book   Does not apply Once  . docusate sodium  200 mg Oral Daily  . metoprolol tartrate  12.5 mg Oral BID   Continuous Infusions:  PRN Meds:.acetaminophen **OR** acetaminophen, senna-docusate   Assessment/Plan: #1. Encephalopathy. Abnormal MRI. Lumbar puncture today. Further neurology evaluation today. #2. Coronary artery disease. Asymptomatic. #3. Hypertension. Blood pressure now normal. Continue metoprolol. Principal Problem:   Altered mental status Active Problems:   Essential  hypertension   HLD (hyperlipidemia)   Coronary artery disease involving native coronary artery   S/P CABG x 4   CVA (cerebral infarction)     LOS: 4 days   Corgan Mormile 12/25/2015, 7:20 AM

## 2015-12-25 NOTE — Procedures (Signed)
Preprocedure Dx: Confusion Postprocedure Dx: Confusion Procedure:  Fluoroscopically guided lumbar puncture Radiologist:  Thornton Papas Anesthesia:  2 ml of 1% lidocaine Specimen:  11 ml CSF, clear colorless EBL:   < 1 ml Opening pressure: 3 cm H2O (measured prone) Complications: None

## 2015-12-25 NOTE — Progress Notes (Signed)
Patient ID: Tyler Mejia, male   DOB: [birth year], 80 y.o.   MRN: [id number]  [place] A. Merlene Laughter, MD     [email or web address]          Tyler Mejia is an 80 y.o. male.   ASSESSMENT/PLAN: DDX: The differential diagnosis includes several possibilities including manganese poisoning, carbon monoxide poisoning, cyanide poisoning, methanol poisoning, hepatocellular degeneration due to chronic alcoholic cirrhosis and non- alcoholic liver cirrhosis, deep venous thrombosis, nutritional deficiencies/Wernicke encephalopathy, extra-pontine myelinolysis and Creutzfeldt-Jakob disease. Anoxic brain injury and hypoglycemia are also possibilities but less likely in this situation. Mitochondrial disorders are also possibility but seems unlikely in this clinical situation. The patient's MRI findings are almost limited exclusively to the internal capsule bilaterally which I think suggest ischemic infarcts. The patient's CSF analysis is concerning with a solid elevated WBC with a significantly high RBC. 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 Consequently, the patient will be started on acyclovir until PCR is back. Additional CSF labs are pending including CJD serologies. We did discuss with the family the likelihood that we will transfer the patient an [facility] in the next day or 2 for additional opinions.    Likely undiagnosed obstructive sleep apnea syndrome.     I had a lengthy discussion with the family. They report that he continues to have significant confusion. He is having frequent often cough and off-the-wall statements. He is not his usual talkative self and typical confrontational self. He is a more docile and also seem to engage in conversation when spoken to. They relate one event where the patient apparently was trying to get a string together by adding sugar but also crush his bread and the solution added body to wait and attempted to drink it. 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 This was tested for, monoxide in the was measured to be 30 ppm with the windows rolled up. They report that his attacks or arch with a car running in the doors open was 300 ppm. I did do a web search and 30 ppm is within the acceptable range of less than 70 ppm. The 300 is concerning but he would have to be in the garage with a car running and is it uncertain if this was the case.    GENERAL: Moderate weight no acute distress.  HEENT: Supple. Atraumatic normocephalic. Short stocky neck.  ABDOMEN: soft  EXTREMITIES: No edema   BACK: Normal.  SKIN: Normal by inspection.   MENTAL STATUS: The patient is awake and alert. He is oriented to hospital and month but thinks is 2016. He does has reduced spontaneous speech. Sometimes he has speech is nonsensical especially related to why he is in the hospital. He reports that he has been placed here because of issues with urination. There appears to be subtle issues with naming and language along with subtle issues with repetition. No dysarthria.  CRANIAL NERVES: Pupils are equal, round and reactive to light and accommodation; extra ocular movements are full, there is no significant nystagmus; visual fields are full; upper and lower facial muscles are normal in strength and symmetric, there is no flattening of the nasolabial folds; tongue is midline; uvula is midline; shoulder elevation is normal.  MOTOR: Normal tone, bulk and strength; no pronator drift.  COORDINATION: Left finger to nose is normal, right finger to nose is normal, No rest tremor; no intention tremor; no postural tremor;  no bradykinesia.  REFLEXES: Deep tendon reflexes are symmetrical and normal. Babinski reflexes are flexor bilaterally.   SENSATION: Normal to light touch   Blood pressure 135/70, pulse 63, temperature 98.4 F (36.9 C), temperature source Oral, resp. rate 18, height 5\' 9"  (1.753  m), weight 102.74 kg (226 lb 8 oz), SpO2 96 %.  Past Medical History  Diagnosis Date  . GERD (gastroesophageal reflux disease)   . Essential hypertension   . Hx of adenomatous colonic polyps   . Nephrolithiasis   . NSTEMI (non-ST elevated myocardial infarction) [facility]) March 2013  . Obesity   . HLD (hyperlipidemia)     a. H/o multiple statin intolerances.  Marland Kitchen COPD (chronic obstructive pulmonary disease) ([place])   . CAD (coronary artery disease)     a. DES to the RCA March 2013 with residual dz rx medically. b. s/p CABGx4 in 08/2015.  . IBS (irritable bowel syndrome)   . Atrial fibrillation, transient ([place])     a. Post-op from CABG 08/2015.  . Collagen vascular disease [facility])     Past Surgical History  Procedure Laterality Date  . Appendectomy    . Esophagogastroduodenoscopy  2003    distal erosion/small hiatal hernia  . Colonoscopy  08/21/2004    Pedunculated polyp at the splenic flexure  . Colonoscopy  09/2009    Dr. Rourk--> Cecal and hepatic flexure polyps, tubular adenomas. Next colonoscopy October 2013  . Esophagogastroduodenoscopy  09/2009    Dr. Evalee Mutton ring, mild distal esophageal erosions, status post Maloney dilation, small hiatal hernia.  . Back surgery    . Colonoscopy N/A 05/07/2013    RMR: colonic polyp/colonic diverticulosis  . Left heart catheterization with coronary angiogram N/A 03/02/2012    Procedure: LEFT HEART CATHETERIZATION WITH CORONARY ANGIOGRAM;  Surgeon: Peter M Martinique, MD;  Location: [facility] CATH LAB;  Service: Cardiovascular;  Laterality: N/A;  . Percutaneous coronary stent intervention (pci-s) N/A 03/02/2012    Procedure: PERCUTANEOUS CORONARY STENT INTERVENTION (PCI-S);  Surgeon: Peter M Martinique, MD;  Location[facility] CATH LAB;  Service: Cardiovascular;  Laterality: N/A;  . Esophagogastroduodenoscopy N/A 03/16/2015    [id number] HH/focal area of distal esophageal s/p bx  . Cardiac catheterization N/A 08/09/2015    Procedure: Left Heart Cath and Coronary  Angiography;  Surgeon: Jettie Booze, MD;  Location: [place] CV LAB;  Service: Cardiovascular;  Laterality: N/A;  . Coronary artery bypass graft N/A 08/11/2015    Procedure: CORONARY ARTERY BYPASS GRAFTING (CABG) X4 UTILIZING THE LEFT INTERNAL MAMMARY ARTERY TO LAD AND ENDOSCOPICALLY HARVESTED BILATERAL SAPHENEOUS VEINS TO DIAGONAL,OM1 AND OM2;  Surgeon: Ivin Poot, MD;  Location: [place];  Service: Open Heart Surgery;  Laterality: N/A;  . Tee without cardioversion N/A 08/11/2015    Procedure: TRANSESOPHAGEAL ECHOCARDIOGRAM (TEE);  Surgeon: Ivin Poot, MD;  Location: [place];  Service: Open Heart Surgery;  Laterality: N/A;    Family History  Problem Relation Age of Onset  . Pneumonia Father   . Heart disease Mother   . Breast cancer Sister     2 sisters  . Esophageal cancer Brother   . Diabetes Mother   . Colon cancer Neg Hx     Social History:  reports that he quit smoking about 14 years ago. His smoking use included Cigarettes. He has a 67.5 pack-year smoking history. He has never used smokeless tobacco. He reports that he does not drink alcohol or use illicit drugs.  Allergies:  Allergies  Allergen Reactions  . Codeine Other (See Comments)  Pt does not know     Medications: Prior to Admission medications   Medication Sig Start Date End Date Taking? Authorizing Provider  acetaminophen (TYLENOL) 325 MG tablet Take 2 tablets (650 mg total) by mouth every 6 (six) hours as needed for mild pain, fever or headache. 08/24/15  Yes Erin R Barrett, PA-C  aspirin EC 325 MG EC tablet Take 1 tablet (325 mg total) by mouth daily. 08/24/15  Yes Erin R Barrett, PA-C  docusate sodium (COLACE) 100 MG capsule Take 2 capsules (200 mg total) by mouth daily. 08/24/15  Yes Erin R Barrett, PA-C  metoprolol tartrate (LOPRESSOR) 25 MG tablet Take 0.5 tablets (12.5 mg total) by mouth 2 (two) times daily. 08/24/15  Yes Erin R Barrett, PA-C  pantoprazole (PROTONIX) 40 MG tablet Take 40 mg by mouth  daily.  10/04/15  Yes Historical Provider, MD  sildenafil (REVATIO) 20 MG tablet Take 60-100 mg by mouth daily as needed.   Yes Historical Provider, MD  ezetimibe (ZETIA) 10 MG tablet Take 1 tablet (10 mg total) by mouth daily. Patient not taking: Reported on 12/21/2015 10/25/15   [name], MD    Scheduled Meds: .  stroke: mapping our early stages of recovery book   Does not apply Once  . cyanocobalamin  1,000 mcg Intramuscular Once  . metoprolol tartrate  12.5 mg Oral BID   Continuous Infusions:  PRN Meds:.acetaminophen **OR** acetaminophen, senna-docusate     Results for orders placed or performed during the hospital encounter of 12/21/15 (from the past 48 hour(s))  Glucose, CSF     Status: None   Collection Time: 12/25/15 12:00 PM  Result Value Ref Range   Glucose, CSF 63 40 - 70 mg/dL  CSF cell count with differential     Status: Abnormal   Collection Time: 12/25/15 12:00 PM  Result Value Ref Range   Tube # 3    Color, CSF COLORLESS COLORLESS   Appearance, CSF CLEAR CLEAR   Supernatant NOT INDICATED    RBC Count, CSF 430 (H) 0 /cu mm   WBC, CSF 8 (H) 0 - 5 /cu mm   Other Cells, CSF TOO FEW TO COUNT, SMEAR AVAILABLE FOR REVIEW     Comment: OCCASIONAL LYMPHOCYTES   CSF culture     Status: None (Preliminary result)   Collection Time: 12/25/15 12:00 PM  Result Value Ref Range   Specimen Description CSF    Special Requests NONE    Gram Stain      NO ORGANISMS SEEN FEW WBC PRESENT, PREDOMINANTLY PMN CYTOSPIN SMEAR    Culture PENDING    Report Status PENDING     Studies/Results:     Gregroy Dombkowski A. Merlene Laughter, M.D.  Diplomate, Tax adviser of Psychiatry and Neurology ( Neurology). 12/25/2015, 6:16 PM

## 2015-12-26 LAB — CRYPTOCOCCAL ANTIGEN, CSF: Crypto Ag: NEGATIVE

## 2015-12-26 LAB — HERPES SIMPLEX VIRUS(HSV) DNA BY PCR
HSV 1 DNA: NEGATIVE
HSV 2 DNA: NEGATIVE

## 2015-12-26 NOTE — Discharge Summary (Signed)
Physician Discharge Summary  KEYNON THUL [id number] DOB: [birth year] DOA: 12/21/2015   Admit date: 12/21/2015 Discharge date: 12/26/2015  Discharge Diagnoses:  Principal Problem:   Altered mental status Active Problems:   Essential hypertension   HLD (hyperlipidemia)   Coronary artery disease involving native coronary artery   S/P CABG x 4   CVA (cerebral infarction)    Wt Readings from Last 3 Encounters:  12/21/15 226 lb 8 oz (102.74 kg)  12/21/15 226 lb (102.513 kg)  10/25/15 226 lb (102.513 kg)     Hospital Course:  This patient is a 80 year old male who presented with altered mental status over the week prior to admission on January 12. His children found him to be increasingly confused. He was not fully oriented. He tried to use his television remote control as a telephone. Initial head CT revealed a possible left lentiform stroke. He was hospitalized by the hospitalist and subsequently had an MRI which revealed abnormal diffusion abnormalities within the bilateral lentiform nucleoli greater on the left than the right. This was suggestive of carbon monoxide poisoning although there was no history to support any known exposure. He was evaluated by neurology. EEG was negative. Carotid ultrasound revealed no hemodynamically significant stenosis. He had no fever. His white count was 7.6. He underwent a lumbar puncture which revealed a glucose of 63 with a protein of 83. 8 WBCs and 430 RBCs were noted. Cryptococcal antigen is negative. Additional studies are pending. He was treated empirically with acyclovir following his lumbar puncture findings. Additional studies are pending.  His case was discussed with neurology at [facility] and arrangements are being made for transfer there today.  The patient is status post bypass surgery last September. He has been stable from a cardiovascular standpoint. He denies angina.  He is alert and comfortable appearing on the morning of transfer.  He remains confused however.   Discharge Instructions     Medication List    ASK your doctor about these medications        acetaminophen 325 MG tablet  Commonly known as:  TYLENOL  Take 2 tablets (650 mg total) by mouth every 6 (six) hours as needed for mild pain, fever or headache.     aspirin 325 MG EC tablet  Take 1 tablet (325 mg total) by mouth daily.     docusate sodium 100 MG capsule  Commonly known as:  COLACE  Take 2 capsules (200 mg total) by mouth daily.     ezetimibe 10 MG tablet  Commonly known as:  ZETIA  Take 1 tablet (10 mg total) by mouth daily.     metoprolol tartrate 25 MG tablet  Commonly known as:  LOPRESSOR  Take 0.5 tablets (12.5 mg total) by mouth 2 (two) times daily.     pantoprazole 40 MG tablet  Commonly known as:  PROTONIX  Take 40 mg by mouth daily.     sildenafil 20 MG tablet  Commonly known as:  REVATIO  Take 60-100 mg by mouth daily as needed.         Alazar Cherian 12/26/2015

## 2015-12-26 NOTE — Progress Notes (Signed)
Report called to Elmo Putt at [facility].  Pt to be transported to [place] via [facility].  Will continue to monitor pt until leaves the floor.

## 2015-12-27 LAB — ATHENA MAIL

## 2015-12-29 LAB — CSF CULTURE: CULTURE: NO GROWTH

## 2015-12-29 LAB — CSF CULTURE W GRAM STAIN: Gram Stain: NONE SEEN

## 2016-01-02 ENCOUNTER — Inpatient Hospital Stay
Admission: RE | Admit: 2016-01-02 | Discharge: 2016-01-22 | Disposition: A | Discharge status: Home / Self Care | Payer: Medicare Other | Source: Ambulatory Visit | Attending: Internal Medicine | Admitting: Internal Medicine

## 2016-01-02 DIAGNOSIS — I639 Cerebral infarction, unspecified: Secondary | ICD-10-CM | POA: Diagnosis not present

## 2016-01-02 DIAGNOSIS — R278 Other lack of coordination: Secondary | ICD-10-CM | POA: Diagnosis not present

## 2016-01-02 DIAGNOSIS — R489 Unspecified symbolic dysfunctions: Secondary | ICD-10-CM | POA: Diagnosis not present

## 2016-01-02 DIAGNOSIS — I2510 Atherosclerotic heart disease of native coronary artery without angina pectoris: Secondary | ICD-10-CM | POA: Diagnosis not present

## 2016-01-02 DIAGNOSIS — M6281 Muscle weakness (generalized): Secondary | ICD-10-CM | POA: Diagnosis not present

## 2016-01-02 DIAGNOSIS — Z951 Presence of aortocoronary bypass graft: Secondary | ICD-10-CM | POA: Diagnosis not present

## 2016-01-02 DIAGNOSIS — R1310 Dysphagia, unspecified: Principal | ICD-10-CM

## 2016-01-02 DIAGNOSIS — R4182 Altered mental status, unspecified: Secondary | ICD-10-CM | POA: Diagnosis not present

## 2016-01-02 DIAGNOSIS — I69328 Other speech and language deficits following cerebral infarction: Secondary | ICD-10-CM | POA: Diagnosis not present

## 2016-01-02 DIAGNOSIS — R262 Difficulty in walking, not elsewhere classified: Secondary | ICD-10-CM | POA: Diagnosis not present

## 2016-01-02 DIAGNOSIS — R1312 Dysphagia, oropharyngeal phase: Secondary | ICD-10-CM | POA: Diagnosis not present

## 2016-01-12 LAB — ADMARK(R) TAU/AB42, CSF

## 2016-01-12 LAB — MISCELLANEOUS TEST

## 2016-01-15 ENCOUNTER — Inpatient Hospital Stay (HOSPITAL_COMMUNITY)
Admit: 2016-01-15 | Discharge: 2016-01-15 | Disposition: A | Discharge status: Home / Self Care | Payer: Medicare Other | Attending: Internal Medicine | Admitting: Internal Medicine

## 2016-01-15 ENCOUNTER — Ambulatory Visit (HOSPITAL_COMMUNITY): Payer: Medicare Other | Attending: Internal Medicine | Admitting: Speech Pathology

## 2016-01-15 DIAGNOSIS — R1310 Dysphagia, unspecified: Secondary | ICD-10-CM | POA: Insufficient documentation

## 2016-01-15 DIAGNOSIS — I639 Cerebral infarction, unspecified: Secondary | ICD-10-CM | POA: Diagnosis not present

## 2016-01-15 NOTE — Therapy (Signed)
[place], [place], [address] Phone: [phone]   Fax:  [phone]  Modified Barium Swallow  Patient Details  Name: Tyler Mejia MRN: [id number] Date of Birth: 28-Oct-1936 No Data Recorded  Encounter Date: 01/15/2016      End of Session - 01/15/16 1825    Visit Number 1   Number of Visits 1   Authorization Type BCBS Medicare   SLP Start Time [id number]   SLP Stop Time  1345   SLP Time Calculation (min) 27 min   Activity Tolerance Patient tolerated treatment well      Past Medical History  Diagnosis Date  . GERD (gastroesophageal reflux disease)   . Essential hypertension   . Hx of adenomatous colonic polyps   . Nephrolithiasis   . NSTEMI (non-ST elevated myocardial infarction) [facility]) March 2013  . Obesity   . HLD (hyperlipidemia)     a. H/o multiple statin intolerances.  Marland Kitchen COPD (chronic obstructive pulmonary disease) ([place])   . CAD (coronary artery disease)     a. DES to the RCA March 2013 with residual dz rx medically. b. s/p CABGx4 in 08/2015.  . IBS (irritable bowel syndrome)   . Atrial fibrillation, transient ([place])     a. Post-op from CABG 08/2015.  . Collagen vascular disease [facility])     Past Surgical History  Procedure Laterality Date  . Appendectomy    . Esophagogastroduodenoscopy  2003    distal erosion/small hiatal hernia  . Colonoscopy  08/21/2004    Pedunculated polyp at the splenic flexure  . Colonoscopy  09/2009    Dr. Rourk--> Cecal and hepatic flexure polyps, tubular adenomas. Next colonoscopy October 2013  . Esophagogastroduodenoscopy  09/2009    Dr. Evalee Mutton ring, mild distal esophageal erosions, status post Maloney dilation, small hiatal hernia.  . Back surgery    . Colonoscopy N/A 05/07/2013    RMR: colonic polyp/colonic diverticulosis  . Left heart catheterization with coronary angiogram N/A 03/02/2012    Procedure: LEFT HEART CATHETERIZATION WITH CORONARY ANGIOGRAM;  Surgeon:  Peter M Martinique, MD;  Location: [facility] CATH LAB;  Service: Cardiovascular;  Laterality: N/A;  . Percutaneous coronary stent intervention (pci-s) N/A 03/02/2012    Procedure: PERCUTANEOUS CORONARY STENT INTERVENTION (PCI-S);  Surgeon: Peter M Martinique, MD;  Location: [facility] CATH LAB;  Service: Cardiovascular;  Laterality: N/A;  . Esophagogastroduodenoscopy N/A 03/16/2015    [id number] HH/focal area of distal esophageal s/p bx  . Cardiac catheterization N/A 08/09/2015    Procedure: Left Heart Cath and Coronary Angiography;  Surgeon: Jettie Booze, MD;  Location: [place] CV LAB;  Service: Cardiovascular;  Laterality: N/A;  . Coronary artery bypass graft N/A 08/11/2015    Procedure: CORONARY ARTERY BYPASS GRAFTING (CABG) X4 UTILIZING THE LEFT INTERNAL MAMMARY ARTERY TO LAD AND ENDOSCOPICALLY HARVESTED BILATERAL SAPHENEOUS VEINS TO DIAGONAL,OM1 AND OM2;  Surgeon: Ivin Poot, MD;  Location: [place];  Service: Open Heart Surgery;  Laterality: N/A;  . Tee without cardioversion N/A 08/11/2015    Procedure: TRANSESOPHAGEAL ECHOCARDIOGRAM (TEE);  Surgeon: Ivin Poot, MD;  Location: [place];  Service: Open Heart Surgery;  Laterality: N/A;    There were no vitals filed for this visit.  Visit Diagnosis: Dysphagia      Subjective Assessment - 01/15/16 1759    Subjective Pt denies difficulty swallowing   Special Tests MBSS   Currently in Pain? No/denies             General -  01/15/16 1800    General Information   Date of Onset 12/28/15   HPI Tyler Mejia is a 80 yo male who was referred for MBSS due to dysphagia following recent hospitalization for stroke (left basal ganglia infarct, remote right medial basal ganglia infarct). He was initially seen at [facility] and transferred to [facility]. He has been at [facility] for rehab and consuming mech soft diet with NTL and will likely be going home soon. MBSS requested to determine if diet can be upgraded.   Type of Study MBS-Modified Barium Swallow Study    Previous Swallow Assessment MBSS at [facility] with recommendation for D3/NTL   Diet Prior to this Study Dysphagia 3 (soft);Nectar-thick liquids   Temperature Spikes Noted No   Respiratory Status Room air   History of Recent Intubation No   Behavior/Cognition Alert;Cooperative;Confused   Oral Cavity Assessment Within Functional Limits   Oral Care Completed by SLP No   Oral Cavity - Dentition --  lower partial   Vision Functional for self feeding   Self-Feeding Abilities Able to feed self   Patient Positioning Upright in chair   Baseline Vocal Quality Normal   Volitional Cough Strong   Volitional Swallow Able to elicit   Anatomy Within functional limits   Pharyngeal Secretions Not observed secondary MBS            Oral Preparation/Oral Phase - 01/15/16 1808    Oral Preparation/Oral Phase   Oral Phase Impaired   Oral - Nectar   Oral - Nectar Cup Within functional limits   Oral - Thin   Oral - Thin Cup Left anterior bolus loss   Oral - Thin Straw Within functional limits   Oral - Solids   Oral - Puree Delayed A-P transit   Oral - Regular Delayed A-P transit;Weak ligual manipulation   Oral - Pill Absent A-P transit  unable to transfer pill A-P with liquid or puree   Electrical stimulation - Oral Phase   Was Electrical Stimulation Used No          Pharyngeal Phase - 01/15/16 1816    Pharyngeal Phase   Pharyngeal Phase Impaired   Pharyngeal - Nectar   Pharyngeal- Nectar Cup Delayed swallow initiation;Swallow initiation at vallecula;Swallow initiation at pyriform sinus;Reduced epiglottic inversion;Reduced tongue base retraction;Pharyngeal residue - valleculae   Pharyngeal - Thin   Pharyngeal- Thin Cup Delayed swallow initiation;Swallow initiation at pyriform sinus;Swallow initiation at vallecula;Reduced epiglottic inversion;Reduced airway/laryngeal closure;Penetration/Aspiration during swallow;Trace aspiration;Pharyngeal residue - valleculae;Lateral channel residue    Pharyngeal Material does not enter airway;Material enters airway, remains ABOVE vocal cords then ejected out;Material enters airway, passes BELOW cords without attempt by patient to eject out (silent aspiration);Material enters airway, passes BELOW cords then ejected out;Material enters airway, CONTACTS cords and then ejected out   Pharyngeal- Thin Straw Delayed swallow initiation;Swallow initiation at vallecula;Swallow initiation at pyriform sinus;Reduced epiglottic inversion;Reduced airway/laryngeal closure;Penetration/Aspiration during swallow;Pharyngeal residue - valleculae   Pharyngeal Material does not enter airway;Material enters airway, remains ABOVE vocal cords then ejected out  mod/max cues for chin tuck   Pharyngeal - Solids   Pharyngeal- Puree Swallow initiation at vallecula;Reduced tongue base retraction;Pharyngeal residue - valleculae   Pharyngeal- Regular Delayed swallow initiation-vallecula;Reduced tongue base retraction;Pharyngeal residue - valleculae   Electrical Stimulation - Pharyngeal Phase   Was Electrical Stimulation Used No          Cricopharyngeal Phase - 01/15/16 1823    Cervical Esophageal Phase   Cervical Esophageal Phase Within functional limits  Plan - Feb 07, 2016 1827    Clinical Impression Statement Mild/Mod oropharyngeal phase dysphagia characterized by decreased labial closure with thin liquids (left side), weak lingual manipulation, delayed oral transit or semi-solids and solids, decreased anterior posterior transit of barium tablet (had to expectorate as he could not swallow with liquid or puree), delay in swallow initiation with pooling in pyriforms prior to swallow trigger with thins, mild decreased tongue base retraction, with variable trace to mod aspiration of thin liquids during the swallow. Aspiration of thin liquids was prevented by implementation of chin tuck facilitated by straw and mod/max cues from SLP. Pt was unable to  consistently implement chin tuck without mod/max SLP cues due to cognitive deficits. Pt with mild vallecular residue following initial swallow of puree and semisolids and required verbal cues to "swallow again". Pt had one episode of gross aspiration of thin liquids when attempting to swallow barium tablet with thins, which elicited strong cough. Trace amounts of aspiration with thin were not always sensed. Would recommend continuing with NTL and trials thin with chin tuck (Pt also tolerated small sips thin without chin tuck on 50% of trials); Regular solids to soft solids clinically if appropriate. PO meds crushed in puree as able.    Treatment/Interventions Diet toleration management by SLP;Patient/family education   Potential to Achieve Goals Fair   Consulted and Agree with Plan of Care Patient          G-Codes - 2016/02/07 1830    Functional Assessment Tool Used clinical judgment; MBSS   Functional Limitations Swallowing   Swallow Current Status [id number]) At least 20 percent but less than 40 percent impaired, limited or restricted   Swallow Goal Status [id number]) At least 1 percent but less than 20 percent impaired, limited or restricted   Swallow Discharge Status [phone]) At least 20 percent but less than 40 percent impaired, limited or restricted          Recommendations/Treatment - 02-07-16 1823    Swallow Evaluation Recommendations   SLP Diet Recommendations Age appropriate regular;Dysphagia 3 (mechanical soft);Nectar   Thickener user Simply thick   Liquid Administration via Cup   Medication Administration Crushed with puree   Supervision Patient able to self feed;Intermittent supervision to cue for compensatory strategies   Compensations Multiple dry swallows after each bite/sip   Postural Changes Seated upright at 90 degrees;Remain upright for at least 30 minutes after feeds/meals          Prognosis - 02-07-2016 1825    Prognosis   Prognosis for Safe Diet Advancement Fair    Barriers to Reach Goals Cognitive deficits   Individuals Consulted   Consulted and Agree with Results and Recommendations Patient;Patient unable/family or caregiver not available   Report Sent to  Referring physician;Primary SLP;Facility (Comment)      Problem List Patient Active Problem List   Diagnosis Date Noted  . CVA (cerebral infarction) 12/21/2015  . Altered mental status 12/21/2015  . Ileus, postoperative 08/23/2015  . S/P CABG x 4 08/11/2015  . Obesity 08/10/2015  . Accelerating angina ([place]) 08/09/2015  . Mucosal abnormality of esophagus   . Hiatal hernia   . Dysphagia 03/15/2015  . Schatzki's ring 03/15/2015  . COPD (chronic obstructive pulmonary disease) ([place]) 08/21/2012  . Coronary artery disease involving native coronary artery 05/20/2012  . HLD (hyperlipidemia) 04/10/2012  . Shortness of breath 02/28/2012  . Essential hypertension 02/28/2012   Thank you,  Genene Churn, [place]  [place] 02-07-2016, 6:31 PM  [facility]  [place], [place], [address] Phone: [phone]   Fax:  [phone]  Name: Tyler Mejia MRN: [id number] Date of Birth: 05-Nov-1936

## 2016-01-25 DIAGNOSIS — I10 Essential (primary) hypertension: Secondary | ICD-10-CM | POA: Diagnosis not present

## 2016-01-25 DIAGNOSIS — R531 Weakness: Secondary | ICD-10-CM | POA: Diagnosis not present

## 2016-01-25 DIAGNOSIS — R413 Other amnesia: Secondary | ICD-10-CM | POA: Diagnosis not present

## 2016-01-25 DIAGNOSIS — J449 Chronic obstructive pulmonary disease, unspecified: Secondary | ICD-10-CM | POA: Diagnosis not present

## 2016-01-25 DIAGNOSIS — R1310 Dysphagia, unspecified: Secondary | ICD-10-CM | POA: Diagnosis not present

## 2016-01-25 DIAGNOSIS — I69398 Other sequelae of cerebral infarction: Secondary | ICD-10-CM | POA: Diagnosis not present

## 2016-01-26 ENCOUNTER — Ambulatory Visit (INDEPENDENT_AMBULATORY_CARE_PROVIDER_SITE_OTHER): Payer: Medicare Other | Admitting: Cardiology

## 2016-01-26 ENCOUNTER — Encounter: Payer: Self-pay | Admitting: Cardiology

## 2016-01-26 VITALS — BP 122/74 | HR 80 | Ht 69.0 in | Wt 231.0 lb

## 2016-01-26 DIAGNOSIS — Z8673 Personal history of transient ischemic attack (TIA), and cerebral infarction without residual deficits: Secondary | ICD-10-CM | POA: Diagnosis not present

## 2016-01-26 DIAGNOSIS — I10 Essential (primary) hypertension: Secondary | ICD-10-CM | POA: Diagnosis not present

## 2016-01-26 DIAGNOSIS — E785 Hyperlipidemia, unspecified: Secondary | ICD-10-CM | POA: Diagnosis not present

## 2016-01-26 DIAGNOSIS — I2510 Atherosclerotic heart disease of native coronary artery without angina pectoris: Secondary | ICD-10-CM

## 2016-01-26 NOTE — Progress Notes (Signed)
Cardiology Office Note  Date: 01/26/2016   ID: Natalie, Corr 1936/02/02, MRN [id number]  PCP: [name], MD  Primary Cardiologist: Rozann Lesches, MD   Chief Complaint  Patient presents with  . Coronary Artery Disease    History of Present Illness: Tyler Mejia is a 80 y.o. male last seen in November 2016. I reviewed his interval records. He was diagnosed with a stroke in January of this year, managed at [facility] initially and ultimately transferred to [facility] for further evaluation. He had rehabilitation at the [facility], and is now living at [place]. He is here with his daughter for a follow-up visit.  His daughter states that he does appear to be getting better with more energy. He is not nearly as active as before, still has some memory deficits as well as swallowing problems. He does not endorse any angina symptoms. He has been doing some of the activities at his facility. NYHA class II dyspnea, no palpitations or syncope.  I reviewed his medications which are outlined below. Cardiac regimen includes aspirin, Plavix, and Lopressor. He has a history of statin intolerance.  Past Medical History  Diagnosis Date  . GERD (gastroesophageal reflux disease)   . Essential hypertension   . Hx of adenomatous colonic polyps   . Nephrolithiasis   . NSTEMI (non-ST elevated myocardial infarction) [facility]) March 2013  . Obesity   . HLD (hyperlipidemia)     a. H/o multiple statin intolerances.  Marland Kitchen COPD (chronic obstructive pulmonary disease) ([place])   . CAD (coronary artery disease)     a. DES to the RCA March 2013 with residual dz rx medically. b. s/p CABGx4 in 08/2015.  . IBS (irritable bowel syndrome)   . Atrial fibrillation, transient ([place])     a. Post-op from CABG 08/2015.  . Collagen vascular disease ([place])   . History of stroke     January 2017    Past Surgical History  Procedure Laterality Date  . Appendectomy    . Esophagogastroduodenoscopy  2003    distal  erosion/small hiatal hernia  . Colonoscopy  08/21/2004    Pedunculated polyp at the splenic flexure  . Colonoscopy  09/2009    Dr. Rourk--> Cecal and hepatic flexure polyps, tubular adenomas. Next colonoscopy October 2013  . Esophagogastroduodenoscopy  09/2009    Dr. Evalee Mutton ring, mild distal esophageal erosions, status post Maloney dilation, small hiatal hernia.  . Back surgery    . Colonoscopy N/A 05/07/2013    RMR: colonic polyp/colonic diverticulosis  . Left heart catheterization with coronary angiogram N/A 03/02/2012    Procedure: LEFT HEART CATHETERIZATION WITH CORONARY ANGIOGRAM;  Surgeon: Peter M Martinique, MD;  Location: [facility] CATH LAB;  Service: Cardiovascular;  Laterality: N/A;  . Percutaneous coronary stent intervention (pci-s) N/A 03/02/2012    Procedure: PERCUTANEOUS CORONARY STENT INTERVENTION (PCI-S);  Surgeon: Peter M Martinique, MD;  Location: [facility] CATH LAB;  Service: Cardiovascular;  Laterality: N/A;  . Esophagogastroduodenoscopy N/A 03/16/2015    [id number] HH/focal area of distal esophageal s/p bx  . Cardiac catheterization N/A 08/09/2015    Procedure: Left Heart Cath and Coronary Angiography;  Surgeon: Jettie Booze, MD;  Location: [place] CV LAB;  Service: Cardiovascular;  Laterality: N/A;  . Coronary artery bypass graft N/A 08/11/2015    Procedure: CORONARY ARTERY BYPASS GRAFTING (CABG) X4 UTILIZING THE LEFT INTERNAL MAMMARY ARTERY TO LAD AND ENDOSCOPICALLY HARVESTED BILATERAL SAPHENEOUS VEINS TO DIAGONAL,OM1 AND OM2;  Surgeon: Ivin Poot, MD;  Location: [place];  Service: Open Heart Surgery;  Laterality: N/A;  . Tee without cardioversion N/A 08/11/2015    Procedure: TRANSESOPHAGEAL ECHOCARDIOGRAM (TEE);  Surgeon: Ivin Poot, MD;  Location: [place];  Service: Open Heart Surgery;  Laterality: N/A;    Current Outpatient Prescriptions  Medication Sig Dispense Refill  . acetaminophen (TYLENOL) 325 MG tablet Take 2 tablets (650 mg total) by mouth every 6 (six) hours as  needed for mild pain, fever or headache.    Marland Kitchen amantadine (SYMMETREL) 100 MG capsule Take 100 mg by mouth.    Marland Kitchen aspirin EC 81 MG tablet Take 81 mg by mouth daily.    . clopidogrel (PLAVIX) 300 MG TABS tablet Take 300 mg by mouth once.    . docusate sodium (COLACE) 100 MG capsule Take 2 capsules (200 mg total) by mouth daily. 10 capsule 0  . metoprolol tartrate (LOPRESSOR) 25 MG tablet Take 0.5 tablets (12.5 mg total) by mouth 2 (two) times daily.    . pantoprazole (PROTONIX) 40 MG tablet Take 40 mg by mouth daily.   11  . traZODone (DESYREL) 50 MG tablet Take 25 mg by mouth.     No current facility-administered medications for this visit.   Allergies:  Codeine   Social History: The patient  reports that he quit smoking about 14 years ago. His smoking use included Cigarettes. He has a 67.5 pack-year smoking history. He has never used smokeless tobacco. He reports that he does not drink alcohol or use illicit drugs.   ROS:  Please see the history of present illness. Otherwise, complete review of systems is positive for memory deficits and swallowing problems.  All other systems are reviewed and negative.   Physical Exam: VS:  BP 122/74 mmHg  Pulse 80  Ht 5\' 9"  (1.753 m)  Wt 231 lb (104.781 kg)  BMI 34.10 kg/m2  SpO2 96%, BMI Body mass index is 34.1 kg/(m^2).  Wt Readings from Last 3 Encounters:  01/26/16 231 lb (104.781 kg)  12/21/15 226 lb 8 oz (102.74 kg)  12/21/15 226 lb (102.513 kg)    General: Overweight male, appears comfortable at rest. HEENT: Conjunctiva and lids normal, oropharynx clear. Neck: Supple, no elevated JVP or carotid bruits, no thyromegaly. Lungs: Clear to auscultation, nonlabored breathing at rest. Thorax: Well-healed sternal incision. Cardiac: Regular rate and rhythm, no S3 or significant systolic murmur, no pericardial rub. Abdomen: Soft, nontender, bowel sounds present. Extremities: Trace ankle edema, distal pulses 2+.  ECG: I personally reviewed the prior  tracing from 12/21/2015 which showed sinus rhythm with nonspecific ST changes and rightward axis.  Recent Labwork: 08/19/2015: Magnesium 2.2 12/21/2015: ALT 14*; AST 23; BUN 14; Creatinine, Ser 1.11; Hemoglobin 14.4; Platelets 174; Potassium 3.9; Sodium 141 12/22/2015: TSH 1.217     Component Value Date/Time   CHOL 128 12/22/2015 0653   TRIG 129 12/22/2015 0653   HDL 29* 12/22/2015 0653   CHOLHDL 4.4 12/22/2015 0653   VLDL 26 12/22/2015 0653   LDLCALC 73 12/22/2015 0653    Other Studies Reviewed Today:  Echocardiogram 08/10/2015: Study Conclusions  - Left ventricle: The cavity size was normal. There was moderate concentric hypertrophy. Systolic function was normal. The estimated ejection fraction was in the range of 50% to 55%. There is hypokinesis of the basalinferior myocardium. Doppler parameters are consistent with abnormal left ventricular relaxation (grade 1 diastolic dysfunction).  Impressions:  - EF is improved when compared to prior echocardiogram.  Echocardiogram 12/28/2015 [facility]): SUMMARY The left ventricular size is normal with mild concentric  left ventricular  hypertrophy.  Left ventricular systolic function is normal; LVEF = 55-60%.  Left ventricular filling pattern is impaired. The right ventricle is normal in size and function. The left atrium is mildly dilated. Injection of agitated saline showed no right-to-left shunt. There is no significant valvular stenosis or regurgitation There is no pericardial effusion. There is no comparison study available. - FINDINGS:  LEFT VENTRICLE There is mild concentric left ventricular hypertrophy. The left ventricular  size is normal. LV ejection fraction = 55-60%. Left ventricular systolic  function is normal. Left ventricular filling pattern is impaired. The left  ventricular wall motion is normal. -  RIGHT VENTRICLE The right ventricle is normal size. The right ventricle is normal in size and    function. The right ventricular systolic function is normal.  LEFT ATRIUM The left atrium is mildly dilated.  RIGHT ATRIUM  Right atrial size is normal. Injection of agitated saline showed no  right-to-left shunt. 2 injections. - AORTIC VALVE There is aortic valve sclerosis. There is no aortic stenosis. There is no  aortic regurgitation. - MITRAL VALVE The mitral valve leaflets appear normal. There is no mitral regurgitation  noted. - TRICUSPID VALVE Structurally normal tricuspid valve. No tricuspid regurgitation. - PULMONIC VALVE Structurally normal pulmonic valve. Trace pulmonic valvular regurgitation. - ARTERIES The aortic root is normal size. - VENOUS Pulmonary venous flow pattern is normal. The IVC is normal in size with an  inspiratory collapse of greater then 50%, suggesting normal right atrial  pressure. - EFFUSION There is no pericardial effusion.  Assessment and Plan:  1. Symptomatically stable CAD status post CABG in September 2016. LVEF is normal range by recent follow-up echocardiogram. Continue current regimen and observation.  2. Recent stroke in January, managed at [facility]. He is on antiplatelet regimen. Still has some memory deficits and also swallowing problems. Currently living in [place].  3. Essential hypertension, blood pressure is well controlled today.  4. Hyperlipidemia with statin intolerance.  Current medicines were reviewed with the patient today.  Disposition: FU with me in 3 months.   Signed, [name], MD, [facility] 01/26/2016 2:42 PM    [facility] at [facility] [address], [place], [place] [address] Phone: [phone]; Fax: [phone]

## 2016-01-26 NOTE — Patient Instructions (Signed)
Your physician recommends that you schedule a follow-up appointment in: 3 months with Dr.McDowell    Your physician recommends that you continue on your current medications as directed. Please refer to the Current Medication list given to you today.     If you need a refill on your cardiac medications before your next appointment, please call your pharmacy.       Thank you for choosing [facility] !         

## 2016-01-29 ENCOUNTER — Telehealth (HOSPITAL_COMMUNITY): Payer: Self-pay | Admitting: Speech Pathology

## 2016-01-29 NOTE — Telephone Encounter (Signed)
[employer] Rep Char called to verify that Tyler Mejia did receive services from our department. I confimed we saw Tyler Mejia for a one time Speech Eval and have not set up any treatment appointment at this time, that could come later. NF 01/29/16

## 2016-02-15 ENCOUNTER — Other Ambulatory Visit (HOSPITAL_COMMUNITY)
Admission: AD | Admit: 2016-02-15 | Payer: Medicare Other | Source: Skilled Nursing Facility | Admitting: Internal Medicine

## 2016-02-16 ENCOUNTER — Other Ambulatory Visit (HOSPITAL_COMMUNITY)
Admission: RE | Admit: 2016-02-16 | Discharge: 2016-02-16 | Disposition: A | Discharge status: Home / Self Care | Payer: Medicare Other | Source: Other Acute Inpatient Hospital | Attending: Internal Medicine | Admitting: Internal Medicine

## 2016-02-16 DIAGNOSIS — I639 Cerebral infarction, unspecified: Secondary | ICD-10-CM | POA: Diagnosis not present

## 2016-02-16 DIAGNOSIS — A047 Enterocolitis due to Clostridium difficile: Secondary | ICD-10-CM | POA: Diagnosis not present

## 2016-02-16 DIAGNOSIS — Z6837 Body mass index (BMI) 37.0-37.9, adult: Secondary | ICD-10-CM | POA: Diagnosis not present

## 2016-02-16 DIAGNOSIS — R197 Diarrhea, unspecified: Secondary | ICD-10-CM | POA: Diagnosis not present

## 2016-02-16 DIAGNOSIS — I2510 Atherosclerotic heart disease of native coronary artery without angina pectoris: Secondary | ICD-10-CM | POA: Diagnosis not present

## 2016-02-16 LAB — C DIFFICILE QUICK SCREEN W PCR REFLEX
C DIFFICILE (CDIFF) INTERP: NEGATIVE
C DIFFICILE (CDIFF) TOXIN: NEGATIVE
C DIFFICLE (CDIFF) ANTIGEN: NEGATIVE

## 2016-02-20 LAB — STOOL CULTURE

## 2016-02-28 ENCOUNTER — Ambulatory Visit (INDEPENDENT_AMBULATORY_CARE_PROVIDER_SITE_OTHER): Payer: Medicare Other | Admitting: Neurology

## 2016-02-28 ENCOUNTER — Encounter: Payer: Self-pay | Admitting: Neurology

## 2016-02-28 VITALS — BP 125/67 | HR 62 | Ht 69.0 in | Wt 236.0 lb

## 2016-02-28 DIAGNOSIS — E785 Hyperlipidemia, unspecified: Secondary | ICD-10-CM | POA: Diagnosis not present

## 2016-02-28 DIAGNOSIS — I63313 Cerebral infarction due to thrombosis of bilateral middle cerebral arteries: Secondary | ICD-10-CM

## 2016-02-28 DIAGNOSIS — I25709 Atherosclerosis of coronary artery bypass graft(s), unspecified, with unspecified angina pectoris: Secondary | ICD-10-CM | POA: Diagnosis not present

## 2016-02-28 DIAGNOSIS — I10 Essential (primary) hypertension: Secondary | ICD-10-CM

## 2016-02-28 NOTE — Patient Instructions (Addendum)
-   continue ASA and plavix for one month and then plavix alone - discontinue amantadine - check BP at ALF periodically - Follow up with your primary care physician for stroke risk factor modification. Recommend maintain blood pressure goal <130/80, diabetes with hemoglobin A1c goal below 6.5% and lipids with LDL cholesterol goal below 70 mg/dL.  - will consider namenda next visit - let us know what outpt PT/OT can take your insurance and we can refer you there - follow up in 3 months.

## 2016-02-28 NOTE — Progress Notes (Signed)
NEUROLOGY CLINIC NEW PATIENT NOTE  NAME: Tyler Mejia DOB: [birth year] REFERRING PHYSICIAN: [name], MD  I saw Butch Penny as a new consult in the neurovascular clinic today regarding  Chief Complaint  Patient presents with  . Referral    recent stroke and confusion from Dr [name] MD  .  HPI: Tyler Mejia is a 80 y.o. male with PMH of HTN, CAD s/p CABG in 08/2015 with transient psot op afib who was admitted to [facility] on 12/21/15 for acute to subacute onset confusion with personality changes. As per note, "about 2-1/2 weeks prior to the admission, he started becoming really sleepy often dozing off doing things. This has been associated with confusion, memory impairment and nonsensical speech. It has gotten worse. He has done inappropriate on things such as trying to use his cell phone to function as the TV remote. The girlfriend reports that he is very talkative but recently has been withdrawn and not talking to anyone. The patient himself is reserved in that has very limited spontaneous speech." CT concerning for subacute infarct in the left GP. MRI showed bilateral DMI restriction of GP, right stronger signal than left, as well as extensive periventricular WM ischemic changes. "MRA left M1 distal mild to moderate stenosis. CUS negative. Had LP showed elevated protein 83 and 430 RBCs. 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 14-3-3 was also sent. He was transferred to [facility] for further workup. HSV was confirmed to be negative and acyclovir was discontinued. Repeat [facility] brain at [facility] showed diffusion restriction of the left medial basal ganglia consistent with acute-subacute stroke and remote right medial basal ganglia stroke and thalamic lacunar strokes. MRA neck and brain was negative. Heavy metal urine studies were sent which returned negative. Due to the highly asymmetric nature of his lesions, toxic metabolic etiology  was felt to be highly unlikely. His MRI findings and clinical presentation were felt to be secondary to acute left basal ganglia stroke in the setting of remote right basal ganglia stroke and thus decompensation of this system due to bilateral lesions. LDL of 63, A1C 6.2. Statins were recommended however the family refused given prior history of severe myalgias due to statin. TTE showed EF of 55-60% with no shunt and mildly dilated LA. The etiology of his stroke was felt to be secondary to microvascular disease and due to the presence of existing cardiac atherosclerotic disease we felt that DAPT was indicated. His home aspirin 325mg  was decreased to 81mg  and plavix 75mg  daily added. He was monitored on telemetry during his admission and no evidence of atrial fibrillation was seen". Due to persistent drowsiness, the patient was started on amantadine to improve alertness and positive changes were noted prior to discharge. He was discharged to ALF. Currently, he is continued on ASA and plavix for stroke prevention and amantadine for mental status. As per daughter and son, he is more awake alert but still has social withdrawal and personality changes, more docile and limited speech output.    Past Medical History  Diagnosis Date  . GERD (gastroesophageal reflux disease)   . Essential hypertension   . Hx of adenomatous colonic polyps   . Nephrolithiasis   . NSTEMI (non-ST elevated myocardial infarction) [facility]) March 2013  . Obesity   . HLD (hyperlipidemia)     a. H/o multiple statin intolerances.  Marland Kitchen COPD (chronic obstructive pulmonary disease) ([place])   . CAD (  coronary artery disease)     a. DES to the RCA March 2013 with residual dz rx medically. b. s/p CABGx4 in 08/2015.  . IBS (irritable bowel syndrome)   . Atrial fibrillation, transient ([place])     a. Post-op from CABG 08/2015.  . Collagen vascular disease ([place])   . History of stroke     January 2017  . Stroke [facility])    Past Surgical History    Procedure Laterality Date  . Appendectomy    . Esophagogastroduodenoscopy  2003    distal erosion/small hiatal hernia  . Colonoscopy  08/21/2004    Pedunculated polyp at the splenic flexure  . Colonoscopy  09/2009    Dr. Rourk--> Cecal and hepatic flexure polyps, tubular adenomas. Next colonoscopy October 2013  . Esophagogastroduodenoscopy  09/2009    Dr. Evalee Mutton ring, mild distal esophageal erosions, status post Maloney dilation, small hiatal hernia.  . Back surgery    . Colonoscopy N/A 05/07/2013    RMR: colonic polyp/colonic diverticulosis  . Left heart catheterization with coronary angiogram N/A 03/02/2012    Procedure: LEFT HEART CATHETERIZATION WITH CORONARY ANGIOGRAM;  Surgeon: Peter M Martinique, MD;  Location: [facility] CATH LAB;  Service: Cardiovascular;  Laterality: N/A;  . Percutaneous coronary stent intervention (pci-s) N/A 03/02/2012    Procedure: PERCUTANEOUS CORONARY STENT INTERVENTION (PCI-S);  Surgeon: Peter M Martinique, MD;  Location: [facility] CATH LAB;  Service: Cardiovascular;  Laterality: N/A;  . Esophagogastroduodenoscopy N/A 03/16/2015    [id number] HH/focal area of distal esophageal s/p bx  . Cardiac catheterization N/A 08/09/2015    Procedure: Left Heart Cath and Coronary Angiography;  Surgeon: Jettie Booze, MD;  Location: [place] CV LAB;  Service: Cardiovascular;  Laterality: N/A;  . Coronary artery bypass graft N/A 08/11/2015    Procedure: CORONARY ARTERY BYPASS GRAFTING (CABG) X4 UTILIZING THE LEFT INTERNAL MAMMARY ARTERY TO LAD AND ENDOSCOPICALLY HARVESTED BILATERAL SAPHENEOUS VEINS TO DIAGONAL,OM1 AND OM2;  Surgeon: Ivin Poot, MD;  Location: [place];  Service: Open Heart Surgery;  Laterality: N/A;  . Tee without cardioversion N/A 08/11/2015    Procedure: TRANSESOPHAGEAL ECHOCARDIOGRAM (TEE);  Surgeon: Ivin Poot, MD;  Location: [place];  Service: Open Heart Surgery;  Laterality: N/A;   Family History  Problem Relation Age of Onset  . Pneumonia Father   .  Heart disease Mother   . Diabetes Mother   . Stroke Mother   . Breast cancer Sister     2 sisters  . Esophageal cancer Brother   . Colon cancer Neg Hx   . Stroke Sister    Current Outpatient Prescriptions  Medication Sig Dispense Refill  . acetaminophen (TYLENOL) 325 MG tablet Take 2 tablets (650 mg total) by mouth every 6 (six) hours as needed for mild pain, fever or headache.    Marland Kitchen amantadine (SYMMETREL) 100 MG capsule Take 100 mg by mouth.    Marland Kitchen aspirin EC 81 MG tablet Take 81 mg by mouth daily.    . clopidogrel (PLAVIX) 300 MG TABS tablet Take 300 mg by mouth once.    . docusate sodium (COLACE) 100 MG capsule Take 2 capsules (200 mg total) by mouth daily. 10 capsule 0  . metoprolol tartrate (LOPRESSOR) 25 MG tablet Take 0.5 tablets (12.5 mg total) by mouth 2 (two) times daily.    . pantoprazole (PROTONIX) 40 MG tablet Take 40 mg by mouth daily.   11  . traZODone (DESYREL) 50 MG tablet Take 25 mg by mouth.     No  current facility-administered medications for this visit.   Allergies  Allergen Reactions  . Codeine Other (See Comments)    Pt does not know   . Statins Other (See Comments) and Nausea And Vomiting   Social History   Social History  . Marital Status: Divorced    Spouse Name: N/A  . Number of Children: N/A  . Years of Education: N/A   Occupational History  . retired Hydrologist    Social History Main Topics  . Smoking status: Former Smoker -- 1.50 packs/day for 45 years    Types: Cigarettes    Quit date: 12/09/2001  . Smokeless tobacco: Never Used     Comment: quit about 9 years ago  . Alcohol Use: No     Comment: Very rarely  . Drug Use: No  . Sexual Activity: Not Currently   Other Topics Concern  . Not on file   Social History Narrative    Review of Systems Full 14 system review of systems performed and notable only for those listed, all others are neg:  Constitutional:   Cardiovascular:  Ear/Nose/Throat:  Hearing loss Skin:  Eyes:    Respiratory:   Gastroitestinal:   Genitourinary:  Hematology/Lymphatic:   Endocrine:  Musculoskeletal:   Allergy/Immunology:   Neurological:  Memory loss, confusion, slurred speech Psychiatric:  Sleep: Insomnia, sleepiness   Physical Exam  Filed Vitals:   02/28/16 0959  BP: 125/67  Pulse: 62    General - Well nourished, well developed, in no apparent distress.  Ophthalmologic - Sharp disc margins OU.  Cardiovascular - Regular rate and rhythm. Carotid pulses were 2+ without bruits.   Neck - supple, no nuchal rigidity .  Mental Status -  Level of arousal and orientation to year, month, week of the day, place, and person were intact, however incorrect for date. Language including expression, naming, repetition, comprehension, reading, and writing was assessed and found intact, however, significant paucity of speech with limited spontaneous speech but able to answer questions clearly if being asked. Attention span and concentration were normal for calculation but incorrect on backward spelling. Recent and remote memory were 3/3 registration and 0/3 delayed recall. Fund of Knowledge was assessed and was intact.  Cranial Nerves II - XII - II - Visual field intact OU. III, IV, VI - Extraocular movements intact. V - Facial sensation intact bilaterally. VII - Facial movement intact bilaterally. VIII - Hearing & vestibular intact bilaterally. X - Palate elevates symmetrically. XI - Chin turning & shoulder shrug intact bilaterally XII - Tongue protrusion intact.  Motor Strength - The patient's strength was normal in all extremities and pronator drift was absent.  Bulk was normal and fasciculations were absent.   Motor Tone - Muscle tone was assessed at the neck and appendages and was normal.  Reflexes - The patient's reflexes were normal in all extremities and he had no pathological reflexes.  Sensory - Light touch, temperature/pinprick were assessed and were normal.     Coordination - The patient had normal movements in the hands and feet with no ataxia or dysmetria.  Tremor was absent.  Gait and Station - The patient's transfers, posture, gait, station, and turns were observed as normal.   Imaging  I have personally reviewed the radiological images below and agree with the radiology interpretations.  MRI and MRA 12/21/15 -  MRI HEAD IMPRESSION: 1. Abnormal diffusion abnormality within the bilateral lentiform nuclei (globus pallidi), left worse than right. Primary differential consideration includes toxic metabolic derangement or  anoxic injury, with carbon monoxide poisoning being the most classical example to cause this abnormality. 2. Moderate chronic small vessel ischemic disease with remote lacunar infarcts within the right internal capsule and left thalamus. MRA HEAD IMPRESSION: 1. No large or proximal arterial branch occlusion within the intracranial circulation. 2. Single short segment mild to moderate focal stenosis within the distal left M1 segment. Otherwise negative intracranial MRA.  CUS 12/22/15 - 1. Mild bilateral carotid bifurcation plaque. No flow limiting stenosis. Degree of stenosis less than 50%. 2. Vertebral arteries are patent with antegrade flow.  EEG - normal EEG  TEE 08/2015 - Left ventricle: Systolic function was normal. The estimated  ejection fraction was in the range of 55% to 60%. - Aortic valve: No evidence of vegetation. - Mitral valve: No evidence of vegetation. - Atrial septum: No defect or patent foramen ovale was identified.  Echo contrast study showed no right-to-left atrial level shunt,  following an increase in RA pressure induced by provocative  maneuvers. - Tricuspid valve: No evidence of vegetation.  MRA head and neck 12/28/15 -  1. Unremarkable MRA of the head.  2. No gross abnormality of motion degraded noncontrast MRA neck.  MRI brain -  1. Acute versus early subacute left medial basal  ganglia infarct.  2. Moderate cerebral atrophy and leukoaraiosis with remote right medial basal ganglia/internal capsule and thalamic lacunae. 3. Paranasal sinus disease as above. 4. Moderately severe C3-C4 spondylosis with anterolisthesis of C3 on C4 contributing to mild central spinal canal stenosis.  CUS and TCD - This is an abnormal carotid ultrasound exam due to the disease described  below, however there is no significant stenotic flow demonstrated  bilaterally. No critical diameter stenosis or unusually complex plaque are  seen on this exam. The left VA had no detectable Doppler signal, which could  indicate a lack of flow or technical issues. The right VA flow direction and  velocity is normal. Insonation at multiple depths was attempted of the  [facility]. Technically limited due to an  inadequate right temporal window. The velocities that could be detected were  below normal, but this is not atypical for advanced age. No evidence of  stenosis, spasm, or flow reversal.   Lab Review Component     Latest Ref Rng 12/21/2015 12/22/2015  Tube #         Color, CSF     COLORLESS    Appearance, CSF     CLEAR    Supernatant         RBC Count, CSF     0 /cu mm    WBC, CSF     0 - 5 /cu mm    Other Cells, CSF         Cholesterol     0 - 200 mg/dL  128  Triglycerides     <150 mg/dL  129  HDL Cholesterol     >40 mg/dL  29 (L)  Total CHOL/HDL Ratio       4.4  VLDL     0 - 40 mg/dL  26  LDL (calc)     0 - 99 mg/dL  73  Specimen Description         Special Requests         Gram Stain         Culture         Report Status         Specimen source hsv  HSV 1 DNA     Negative    HSV 2 DNA     Negative    Hemoglobin A1C     4.8 - 5.6 % 6.0 (H)   Mean Plasma Glucose      126   Crypto Ag     NEGATIVE    Cryptococcal Ag Titer     NOT INDICATED    Miscellaneous Test         Miscellaneous Test Results         Vitamin B12      180 - 914 pg/mL  315  TSH     0.350 - 4.500 uIU/mL  1.217  RPR     Non Reactive  Non Reactive  Homocysteine     0.0 - 15.0 umol/L  12.5  CRP     <1.0 mg/dL  0.7  Sed Rate     0 - 16 mm/hr  7  ANA Ab, IFA       Negative  HIV     Non Reactive  Non Reactive  Glucose, CSF     40 - 70 mg/dL    Total  Protein, CSF     15 - 45 mg/dL     Component     Latest Ref Rng 12/25/2015  Tube #      3  Color, CSF     COLORLESS COLORLESS  Appearance, CSF     CLEAR CLEAR  Supernatant      NOT INDICATED  RBC Count, CSF     0 /cu mm 430 (H)  WBC, CSF     0 - 5 /cu mm 8 (H)  Other Cells, CSF      TOO FEW TO COUNT, SMEAR AVAILABLE FOR REVIEW  Cholesterol     0 - 200 mg/dL   Triglycerides     <150 mg/dL   HDL Cholesterol     >40 mg/dL   Total CHOL/HDL Ratio        VLDL     0 - 40 mg/dL   LDL (calc)     0 - 99 mg/dL   Specimen Description      CSF  Special Requests      NONE  Gram Stain      NO ORGANISMS SEEN . . .  Culture      NO GROWTH 3 DAYS . . .  Report Status      12/29/2015 FINAL  Specimen source hsv      CSF  HSV 1 DNA     Negative Negative  HSV 2 DNA     Negative Negative  Hemoglobin A1C     4.8 - 5.6 %   Mean Plasma Glucose        Crypto Ag     NEGATIVE NEGATIVE  Cryptococcal Ag Titer     NOT INDICATED NOT INDICATED  Miscellaneous Test      TEST 11194 PROTEIN 14 3 3   Miscellaneous Test Results      SEE SEPARATE REPORT  Vitamin B12     180 - 914 pg/mL   TSH     0.350 - 4.500 uIU/mL   RPR     Non Reactive   Homocysteine     0.0 - 15.0 umol/L   CRP     <1.0 mg/dL   Sed Rate     0 - 16 mm/hr   ANA Ab, IFA        HIV  Non Reactive   Glucose, CSF     40 - 70 mg/dL 63  Total  Protein, CSF     15 - 45 mg/dL 83 (H)    Assessment and Plan:   In summary, PLACIDO ELIADES is a 80 y.o. male with PMH of HTN, CAD s/p CABG in 08/2015 with transient psot-op afib who was admitted to [facility] on 12/21/15 for acute to subacute onset confusion with  personality changes. MRI showed bilateral DMI restriction of GP, right stronger signal than left, as well as extensive periventricular WM ischemic changes. intially MRI concerning for CO poisoning and other metabolic etiology. MRA left M1 distal mild to moderate stenosis. CUS negative. Had LP showed elevated protein 83 and 430 RBCs. He was transferred to [facility] for further workup. Repeat MRI brain at [facility] showed diffusion restriction of the left medial basal ganglia consistent with acute-subacute stroke and remote right medial basal ganglia stroke and thalamic lacunar strokes. MRA neck and brain was negative. Heavy metal urine studies negative. Due to the highly asymmetric nature of his lesions, toxic metabolic etiology was felt to be highly unlikely. His MRI findings and clinical presentation were felt to be secondary to acute left basal ganglia stroke in the setting of remote right basal ganglia stroke and thus decompensation of this system due to bilateral lesions. LDL of 63, A1C 6.2. Statins were recommended however the family refused given prior history of severe myalgias due to statin. TTE showed EF of 55-60% with no shunt and mildly dilated LA. The etiology of his stroke was felt to be secondary to microvascular disease and due to the presence of existing cardiac atherosclerotic disease we felt that DAPT was indicated. His home aspirin 325mg  was decreased to 81mg  and plavix 75mg  daily added. He was also started on amantadine to improve alertness. He was discharged to ALF. As per daughter and son, he is currently more awake alert but still has social withdrawal and personality changes, more docile and limited speech output.   Plan: - continue ASA and plavix for one month and then plavix alone - discontinue amantadine, as long term amantadine use is not recommended. - check BP at ALF - Follow up with your primary care physician for stroke risk factor modification. Recommend maintain blood pressure goal  <130/80, diabetes with hemoglobin A1c goal below 6.5% and lipids with LDL cholesterol goal below 70 mg/dL.  - may consider namenda next visit as pt has chronic diarrhea not suitable for aricept - consider PT/OT. Daughter is to find out place and we will do the referral - follow up in 3 months.  I recommend aggressive blood pressure control with a goal <130/80 mm Hg.  Lipids should be managed intensively, with a goal LDL < 70 mg/dL.  I encouraged the patient to discuss these important issues with his primary care physician.  I counseled the patient on measures to reduce stroke risk, including the importance of medication compliance, risk factor control, exercise, healthy diet, and avoidance of smoking.  I reviewed stroke warning signs and symptoms and appropriate actions to take if such occurs.   Thank you very much for the opportunity to participate in the care of this patient.  Please do not hesitate to call if any questions or concerns arise.  No orders of the defined types were placed in this encounter.    No orders of the defined types were placed in this encounter.    Patient Instructions  - continue ASA and plavix for  one month and then plavix alone - discontinue amantadine - check BP at ALF periodically - Follow up with your primary care physician for stroke risk factor modification. Recommend maintain blood pressure goal <130/80, diabetes with hemoglobin A1c goal below 6.5% and lipids with LDL cholesterol goal below 70 mg/dL.  - will consider namenda next visit - let us know what outpt PT/OT can take your insurance and we can refer you there - follow up in 3 months.    Rosalin Hawking, MD PhD [facility] [address] [place], [place] [address] [phone]

## 2016-02-29 DIAGNOSIS — I25709 Atherosclerosis of coronary artery bypass graft(s), unspecified, with unspecified angina pectoris: Secondary | ICD-10-CM | POA: Insufficient documentation

## 2016-02-29 DIAGNOSIS — I63313 Cerebral infarction due to thrombosis of bilateral middle cerebral arteries: Secondary | ICD-10-CM | POA: Insufficient documentation

## 2016-03-01 ENCOUNTER — Other Ambulatory Visit (HOSPITAL_COMMUNITY)
Admission: RE | Admit: 2016-03-01 | Discharge: 2016-03-01 | Disposition: A | Discharge status: Home / Self Care | Payer: Medicare Other | Source: Skilled Nursing Facility | Attending: Internal Medicine | Admitting: Internal Medicine

## 2016-03-01 DIAGNOSIS — N390 Urinary tract infection, site not specified: Secondary | ICD-10-CM | POA: Diagnosis not present

## 2016-03-01 LAB — URINALYSIS, ROUTINE W REFLEX MICROSCOPIC
Bilirubin Urine: NEGATIVE
GLUCOSE, UA: NEGATIVE mg/dL
HGB URINE DIPSTICK: NEGATIVE
KETONES UR: NEGATIVE mg/dL
Leukocytes, UA: NEGATIVE
Nitrite: NEGATIVE
PH: 6 (ref 5.0–8.0)
PROTEIN: NEGATIVE mg/dL
Specific Gravity, Urine: 1.02 (ref 1.005–1.030)

## 2016-03-04 LAB — OVA + PARASITE EXAM

## 2016-03-07 LAB — URINE CULTURE: Culture: 20000

## 2016-04-25 ENCOUNTER — Ambulatory Visit (INDEPENDENT_AMBULATORY_CARE_PROVIDER_SITE_OTHER): Payer: Medicare Other | Admitting: Cardiology

## 2016-04-25 ENCOUNTER — Encounter: Payer: Self-pay | Admitting: Cardiology

## 2016-04-25 VITALS — BP 122/76 | HR 71 | Ht 69.0 in | Wt 237.0 lb

## 2016-04-25 DIAGNOSIS — E785 Hyperlipidemia, unspecified: Secondary | ICD-10-CM

## 2016-04-25 DIAGNOSIS — I2510 Atherosclerotic heart disease of native coronary artery without angina pectoris: Secondary | ICD-10-CM | POA: Diagnosis not present

## 2016-04-25 DIAGNOSIS — I10 Essential (primary) hypertension: Secondary | ICD-10-CM | POA: Diagnosis not present

## 2016-04-25 NOTE — Progress Notes (Signed)
Cardiology Office Note  Date: 04/25/2016   ID: Tyler Mejia 12-04-1936, MRN [id number]  PCP: [name], MD  Primary Cardiologist: Rozann Lesches, MD   Chief Complaint  Patient presents with  . Coronary Artery Disease    History of Present Illness: Tyler Mejia is a 80 y.o. male last seen in February. He presents today with his daughter for a follow-up visit. He does not report any angina symptoms. It sounds like his activity level is fairly low. He still resides at [facility]. He has not been participating in any of the organized exercise activities, I did encourage him to consider this. He has had some mild lower leg edema related to inactivity, better when he puts his feet up and walks. At this time he is not on a diuretic.  I reviewed his medications. He continues on aspirin, Plavix, and Lopressor. He has known statin intolerance. LDL was 73 back in January.  He continues to follow with Dr. Willey Blade for primary care.  Past Medical History  Diagnosis Date  . GERD (gastroesophageal reflux disease)   . Essential hypertension   . Hx of adenomatous colonic polyps   . Nephrolithiasis   . NSTEMI (non-ST elevated myocardial infarction) [facility]) March 2013  . Obesity   . HLD (hyperlipidemia)     a. H/o multiple statin intolerances.  Marland Kitchen COPD (chronic obstructive pulmonary disease) ([place])   . CAD (coronary artery disease)     a. DES to the RCA March 2013 with residual dz rx medically. b. s/p CABGx4 in 08/2015.  . IBS (irritable bowel syndrome)   . Atrial fibrillation, transient ([place])     a. Post-op from CABG 08/2015.  . Collagen vascular disease ([place])   . History of stroke     January 2017    Current Outpatient Prescriptions  Medication Sig Dispense Refill  . acetaminophen (TYLENOL) 325 MG tablet Take 2 tablets (650 mg total) by mouth every 6 (six) hours as needed for mild pain, fever or headache.    Marland Kitchen aspirin EC 81 MG tablet Take 81 mg by mouth daily.    . clopidogrel (PLAVIX)  300 MG TABS tablet Take 300 mg by mouth once.    . docusate sodium (COLACE) 100 MG capsule Take 2 capsules (200 mg total) by mouth daily. 10 capsule 0  . metoprolol tartrate (LOPRESSOR) 25 MG tablet Take 0.5 tablets (12.5 mg total) by mouth 2 (two) times daily.    . pantoprazole (PROTONIX) 40 MG tablet Take 40 mg by mouth daily.   11  . tamsulosin (FLOMAX) 0.4 MG CAPS capsule Take 0.4 mg by mouth daily.  10  . traZODone (DESYREL) 50 MG tablet Take 25 mg by mouth.     No current facility-administered medications for this visit.   Allergies:  Codeine and Statins   Social History: The patient  reports that he quit smoking about 14 years ago. His smoking use included Cigarettes. He has a 67.5 pack-year smoking history. He has never used smokeless tobacco. He reports that he does not drink alcohol or use illicit drugs.   ROS:  Please see the history of present illness. Otherwise, complete review of systems is positive for mild leg edema.  All other systems are reviewed and negative.   Physical Exam: VS:  BP 122/76 mmHg  Pulse 71  Ht 5\' 9"  (1.753 m)  Wt 237 lb (107.502 kg)  BMI 34.98 kg/m2  SpO2 93%, BMI Body mass index is 34.98 kg/(m^2).  Wt Readings  from Last 3 Encounters:  04/25/16 237 lb (107.502 kg)  02/28/16 236 lb (107.049 kg)  01/26/16 231 lb (104.781 kg)    General: Overweight male, appears comfortable at rest. HEENT: Conjunctiva and lids normal, oropharynx clear. Neck: Supple, no elevated JVP or carotid bruits, no thyromegaly. Lungs: Clear to auscultation, nonlabored breathing at rest. Thorax: Well-healed sternal incision. Cardiac: Regular rate and rhythm, no S3 or significant systolic murmur, no pericardial rub. Abdomen: Soft, nontender, bowel sounds present. Extremities: Trace ankle edema, distal pulses 2+.  ECG: I personally reviewed the prior tracing from 12/21/2015 which showed sinus rhythm with rightward axis, nonspecific ST changes.  Recent Labwork: 08/19/2015:  Magnesium 2.2 12/21/2015: ALT 14*; AST 23; BUN 14; Creatinine, Ser 1.11; Hemoglobin 14.4; Platelets 174; Potassium 3.9; Sodium 141 12/22/2015: TSH 1.217     Component Value Date/Time   CHOL 128 12/22/2015 0653   TRIG 129 12/22/2015 0653   HDL 29* 12/22/2015 0653   CHOLHDL 4.4 12/22/2015 0653   VLDL 26 12/22/2015 0653   LDLCALC 73 12/22/2015 0653   Other Studies Reviewed Today:  Echocardiogram 12/28/2015 [facility]): SUMMARY The left ventricular size is normal with mild concentric left ventricular  hypertrophy.  Left ventricular systolic function is normal; LVEF = 55-60%.  Left ventricular filling pattern is impaired. The right ventricle is normal in size and function. The left atrium is mildly dilated. Injection of agitated saline showed no right-to-left shunt. There is no significant valvular stenosis or regurgitation There is no pericardial effusion. There is no comparison study available. - FINDINGS:  LEFT VENTRICLE There is mild concentric left ventricular hypertrophy. The left ventricular  size is normal. LV ejection fraction = 55-60%. Left ventricular systolic  function is normal. Left ventricular filling pattern is impaired. The left  ventricular wall motion is normal. -  RIGHT VENTRICLE The right ventricle is normal size. The right ventricle is normal in size and  function. The right ventricular systolic function is normal.  LEFT ATRIUM The left atrium is mildly dilated.  RIGHT ATRIUM  Right atrial size is normal. Injection of agitated saline showed no  right-to-left shunt. 2 injections. - AORTIC VALVE There is aortic valve sclerosis. There is no aortic stenosis. There is no  aortic regurgitation. - MITRAL VALVE The mitral valve leaflets appear normal. There is no mitral regurgitation  noted. - TRICUSPID VALVE Structurally normal tricuspid valve. No tricuspid regurgitation. - PULMONIC VALVE Structurally normal pulmonic valve. Trace pulmonic valvular  regurgitation. - ARTERIES The aortic root is normal size. - VENOUS Pulmonary venous flow pattern is normal. The IVC is normal in size with an  inspiratory collapse of greater then 50%, suggesting normal right atrial  pressure. - EFFUSION There is no pericardial effusion.  Assessment and Plan:  1. Multivessel CAD status post CABG in September 2016. LVEF 55-60%. No active angina symptoms on present medical regimen. He has had some mild leg edema in the setting of inactivity, better when he puts his feet up. Discussed salt restriction and a walking regimen. Could consider a low-dose diuretic to be used as needed eventually.  2. Essential hypertension, blood pressure is well controlled today.  3. Hyperlipidemia with statin intolerance, last LDL was only 73.  Current medicines were reviewed with the patient today.  Disposition: FU with me in 6 months.   Signed, [name], MD, [facility] 04/25/2016 10:13 AM    [place] [facility] [address] [place], [place] [address] Phone: [phone]; Fax: [phone]

## 2016-04-25 NOTE — Patient Instructions (Signed)
Your physician wants you to follow-up in: 6 months with Dr.McDowell You will receive a reminder letter in the mail two months in advance. If you don't receive a letter, please call our office to schedule the follow-up appointment.     Your physician recommends that you continue on your current medications as directed. Please refer to the Current Medication list given to you today.     Thank you for choosing [facility] !        

## 2016-05-21 ENCOUNTER — Ambulatory Visit (INDEPENDENT_AMBULATORY_CARE_PROVIDER_SITE_OTHER): Payer: Medicare Other | Admitting: Neurology

## 2016-05-21 ENCOUNTER — Encounter: Payer: Self-pay | Admitting: Neurology

## 2016-05-21 VITALS — BP 113/66 | HR 58 | Ht 69.0 in | Wt 236.6 lb

## 2016-05-21 DIAGNOSIS — I63313 Cerebral infarction due to thrombosis of bilateral middle cerebral arteries: Secondary | ICD-10-CM | POA: Diagnosis not present

## 2016-05-21 DIAGNOSIS — I10 Essential (primary) hypertension: Secondary | ICD-10-CM

## 2016-05-21 DIAGNOSIS — I25709 Atherosclerosis of coronary artery bypass graft(s), unspecified, with unspecified angina pectoris: Secondary | ICD-10-CM

## 2016-05-21 DIAGNOSIS — E785 Hyperlipidemia, unspecified: Secondary | ICD-10-CM | POA: Diagnosis not present

## 2016-05-21 DIAGNOSIS — F0390 Unspecified dementia without behavioral disturbance: Secondary | ICD-10-CM | POA: Diagnosis not present

## 2016-05-21 NOTE — Progress Notes (Signed)
NEUROLOGY CLINIC NEW PATIENT NOTE  NAME: Tyler Mejia DOB: May 03, 1936 REFERRING PHYSICIAN: [name], MD  I saw Tyler Mejia as a new consult in the neurovascular clinic today regarding  Chief Complaint  Patient presents with  . Follow-up    CVA FOLLOW UP.PATIENT IS AT [facility] LIVING IS IN MEMORY UNIT  .  HPI: Tyler Mejia is a 80 y.o. male with PMH of HTN, CAD s/p CABG in 08/2015 with transient psot op afib who was admitted to [facility] on 12/21/15 for acute to subacute onset confusion with personality changes. As per note, "about 2-1/2 weeks prior to the admission, he started becoming really sleepy often dozing off doing things. This has been associated with confusion, memory impairment and nonsensical speech. It has gotten worse. He has done inappropriate on things such as trying to use his cell phone to function as the TV remote. The girlfriend reports that he is very talkative but recently has been withdrawn and not talking to anyone. The patient himself is reserved in that has very limited spontaneous speech." CT concerning for subacute infarct in the left GP. MRI showed bilateral DMI restriction of GP, right stronger signal than left, as well as extensive periventricular WM ischemic changes. "MRA left M1 distal mild to moderate stenosis. CUS negative. Had LP showed elevated protein 83 and 430 RBCs. 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 14-3-3 was also sent. He was transferred to [facility] for further workup. HSV was confirmed to be negative and acyclovir was discontinued. Repeat MRI brain at [facility] showed diffusion restriction of the left medial basal ganglia consistent with acute-subacute stroke and remote right medial basal ganglia stroke and thalamic lacunar strokes. MRA neck and brain was negative. Heavy metal urine studies were sent which returned negative. Due to the highly asymmetric nature of his lesions, toxic  metabolic etiology was felt to be highly unlikely. His MRI findings and clinical presentation were felt to be secondary to acute left basal ganglia stroke in the setting of remote right basal ganglia stroke and thus decompensation of this system due to bilateral lesions. LDL of 63, A1C 6.2. Statins were recommended however the family refused given prior history of severe myalgias due to statin. TTE showed EF of 55-60% with no shunt and mildly dilated LA. The etiology of his stroke was felt to be secondary to microvascular disease and due to the presence of existing cardiac atherosclerotic disease we felt that DAPT was indicated. His home aspirin 325mg  was decreased to 81mg  and plavix 75mg  daily added. He was monitored on telemetry during his admission and no evidence of atrial fibrillation was seen". Due to persistent drowsiness, the patient was started on amantadine to improve alertness and positive changes were noted prior to discharge. He was discharged to ALF. Currently, he is continued on ASA and plavix for stroke prevention and amantadine for mental status. As per daughter and son, he is more awake alert but still has social withdrawal and personality changes, more docile and limited speech output.   Interval history: During the interval time, pt has been doing the same. He was not able to live in ALF without supervision anymore due to confusion and wondering around outside apartment. He was then sent to memory unit. Son stated that as most memory unit residents have advanced dementia, pt seems lack of communication there and concerning for further decline. However, his insurance not covering for SNF.  BP today 113/66. On ASA and plavix and family still declined statin or aricept or nemanda    Past Medical History  Diagnosis Date  . GERD (gastroesophageal reflux disease)   . Essential hypertension   . Hx of adenomatous colonic polyps   . Nephrolithiasis   . NSTEMI (non-ST elevated myocardial  infarction) [facility]) March 2013  . Obesity   . HLD (hyperlipidemia)     a. H/o multiple statin intolerances.  Marland Kitchen COPD (chronic obstructive pulmonary disease) ([place])   . CAD (coronary artery disease)     a. DES to the RCA March 2013 with residual dz rx medically. b. s/p CABGx4 in 08/2015.  . IBS (irritable bowel syndrome)   . Atrial fibrillation, transient ([place])     a. Post-op from CABG 08/2015.  . Collagen vascular disease ([place])   . History of stroke     January 2017  . Stroke [facility])    Past Surgical History  Procedure Laterality Date  . Appendectomy    . Esophagogastroduodenoscopy  2003    distal erosion/small hiatal hernia  . Colonoscopy  08/21/2004    Pedunculated polyp at the splenic flexure  . Colonoscopy  09/2009    Dr. Rourk--> Cecal and hepatic flexure polyps, tubular adenomas. Next colonoscopy October 2013  . Esophagogastroduodenoscopy  09/2009    Dr. Evalee Mutton ring, mild distal esophageal erosions, status post Maloney dilation, small hiatal hernia.  . Back surgery    . Colonoscopy N/A 05/07/2013    RMR: colonic polyp/colonic diverticulosis  . Left heart catheterization with coronary angiogram N/A 03/02/2012    Procedure: LEFT HEART CATHETERIZATION WITH CORONARY ANGIOGRAM;  Surgeon: Peter M Martinique, MD;  Location: [facility] CATH LAB;  Service: Cardiovascular;  Laterality: N/A;  . Percutaneous coronary stent intervention (pci-s) N/A 03/02/2012    Procedure: PERCUTANEOUS CORONARY STENT INTERVENTION (PCI-S);  Surgeon: Peter M Martinique, MD;  Location: [facility] CATH LAB;  Service: Cardiovascular;  Laterality: N/A;  . Esophagogastroduodenoscopy N/A 03/16/2015    [id number] HH/focal area of distal esophageal s/p bx  . Cardiac catheterization N/A 08/09/2015    Procedure: Left Heart Cath and Coronary Angiography;  Surgeon: Jettie Booze, MD;  Location: [place] CV LAB;  Service: Cardiovascular;  Laterality: N/A;  . Coronary artery bypass graft N/A 08/11/2015    Procedure: CORONARY ARTERY BYPASS  GRAFTING (CABG) X4 UTILIZING THE LEFT INTERNAL MAMMARY ARTERY TO LAD AND ENDOSCOPICALLY HARVESTED BILATERAL SAPHENEOUS VEINS TO DIAGONAL,OM1 AND OM2;  Surgeon: Ivin Poot, MD;  Location: [place];  Service: Open Heart Surgery;  Laterality: N/A;  . Tee without cardioversion N/A 08/11/2015    Procedure: TRANSESOPHAGEAL ECHOCARDIOGRAM (TEE);  Surgeon: Ivin Poot, MD;  Location: [place];  Service: Open Heart Surgery;  Laterality: N/A;   Family History  Problem Relation Age of Onset  . Pneumonia Father   . Heart disease Mother   . Diabetes Mother   . Stroke Mother   . Breast cancer Sister     2 sisters  . Esophageal cancer Brother   . Colon cancer Neg Hx   . Stroke Sister    Current Outpatient Prescriptions  Medication Sig Dispense Refill  . acetaminophen (TYLENOL) 325 MG tablet Take 2 tablets (650 mg total) by mouth every 6 (six) hours as needed for mild pain, fever or headache.    Marland Kitchen aspirin EC 81 MG tablet Take 81 mg by mouth daily.    . clopidogrel (PLAVIX) 300 MG TABS tablet Take 75 mg by mouth once.     Marland Kitchen  loperamide (IMODIUM) 2 MG capsule   10  . metoprolol tartrate (LOPRESSOR) 25 MG tablet Take 0.5 tablets (12.5 mg total) by mouth 2 (two) times daily.    . pantoprazole (PROTONIX) 40 MG tablet Take 40 mg by mouth daily.   11  . tamsulosin (FLOMAX) 0.4 MG CAPS capsule Take 0.4 mg by mouth daily.  10  . traZODone (DESYREL) 50 MG tablet Take 25 mg by mouth.     No current facility-administered medications for this visit.   Allergies  Allergen Reactions  . Codeine Other (See Comments)    Pt does not know   . Statins Other (See Comments) and Nausea And Vomiting   Social History   Social History  . Marital Status: Divorced    Spouse Name: N/A  . Number of Children: N/A  . Years of Education: N/A   Occupational History  . retired Hydrologist    Social History Main Topics  . Smoking status: Former Smoker -- 1.50 packs/day for 45 years    Types: Cigarettes    Quit  date: 12/09/2001  . Smokeless tobacco: Never Used     Comment: quit about 9 years ago  . Alcohol Use: No     Comment: Very rarely  . Drug Use: No  . Sexual Activity: Not Currently   Other Topics Concern  . Not on file   Social History Narrative    Review of Systems Full 14 system review of systems performed and notable only for those listed, all others are neg:  Constitutional:   Cardiovascular: leg swelling Ear/Nose/Throat:  Hearing loss Skin:  Eyes:   Respiratory:   Gastroitestinal:  Incontinence of bowels Genitourinary: incontinence of bladder Hematology/Lymphatic:   Endocrine:  Musculoskeletal:  Walking difficulty Allergy/Immunology:   Neurological:  Memory loss, confusion, HA Psychiatric: decreased concentration Sleep:    Physical Exam  Filed Vitals:   05/21/16 1049  BP: 113/66  Pulse: 58    General - Well nourished, well developed, in no apparent distress.  Ophthalmologic - Sharp disc margins OU.  Cardiovascular - Regular rate and rhythm. Carotid pulses were 2+ without bruits.   Neck - supple, no nuchal rigidity .  Mental Status -  Level of arousal and orientation to year, month, week of the day, place, and person were intact, however incorrect for date. Language including expression, naming, repetition, comprehension, reading, and writing was assessed and found intact, however, significant paucity of speech with limited spontaneous speech but able to answer questions clearly if being asked. Attention span and concentration were normal for calculation but incorrect on backward spelling. Recent and remote memory were 3/3 registration and 0/3 delayed recall. Fund of Knowledge was assessed and was intact.  mini-mental status exam  Orientation to time - 2/5 Orientation to place - 3/5 Registration - 3/3 Attention - 0/5 Delayed recall - 1/3 Naming - 2/2 Repetition - 1/1 Comprehension - 3/3 Reading - 0/1 Writing - 0/1 Visuospatial - 1/1  Total  16/30  Cranial Nerves II - XII - II - Visual field intact OU. III, IV, VI - Extraocular movements intact. V - Facial sensation intact bilaterally. VII - Facial movement intact bilaterally. VIII - Hearing & vestibular intact bilaterally. X - Palate elevates symmetrically. XI - Chin turning & shoulder shrug intact bilaterally XII - Tongue protrusion intact.  Motor Strength - The patient's strength was normal in all extremities and pronator drift was absent.  Bulk was normal and fasciculations were absent.   Motor Tone - Muscle tone  was assessed at the neck and appendages and was normal.  Reflexes - The patient's reflexes were normal in all extremities and he had no pathological reflexes.  Sensory - Light touch, temperature/pinprick were assessed and were normal.    Coordination - The patient had normal movements in the hands and feet with no ataxia or dysmetria.  Tremor was absent.  Gait and Station - The patient's transfers, posture, gait, station, and turns were observed as normal.   Imaging  I have personally reviewed the radiological images below and agree with the radiology interpretations.  MRI and MRA 12/21/15 -  MRI HEAD IMPRESSION: 1. Abnormal diffusion abnormality within the bilateral lentiform nuclei (globus pallidi), left worse than right. Primary differential consideration includes toxic metabolic derangement or anoxic injury, with carbon monoxide poisoning being the most classical example to cause this abnormality. 2. Moderate chronic small vessel ischemic disease with remote lacunar infarcts within the right internal capsule and left thalamus. MRA HEAD IMPRESSION: 1. No large or proximal arterial branch occlusion within the intracranial circulation. 2. Single short segment mild to moderate focal stenosis within the distal left M1 segment. Otherwise negative intracranial MRA.  CUS 12/22/15 - 1. Mild bilateral carotid bifurcation plaque. No flow  limiting stenosis. Degree of stenosis less than 50%. 2. Vertebral arteries are patent with antegrade flow.  EEG - normal EEG  TEE 08/2015 - Left ventricle: Systolic function was normal. The estimated  ejection fraction was in the range of 55% to 60%. - Aortic valve: No evidence of vegetation. - Mitral valve: No evidence of vegetation. - Atrial septum: No defect or patent foramen ovale was identified.  Echo contrast study showed no right-to-left atrial level shunt,  following an increase in RA pressure induced by provocative  maneuvers. - Tricuspid valve: No evidence of vegetation.  MRA head and neck 12/28/15 -  1. Unremarkable MRA of the head.  2. No gross abnormality of motion degraded noncontrast MRA neck.  MRI brain -  1. Acute versus early subacute left medial basal ganglia infarct.  2. Moderate cerebral atrophy and leukoaraiosis with remote right medial basal ganglia/internal capsule and thalamic lacunae. 3. Paranasal sinus disease as above. 4. Moderately severe C3-C4 spondylosis with anterolisthesis of C3 on C4 contributing to mild central spinal canal stenosis.  CUS and TCD - This is an abnormal carotid ultrasound exam due to the disease described  below, however there is no significant stenotic flow demonstrated  bilaterally. No critical diameter stenosis or unusually complex plaque are  seen on this exam. The left VA had no detectable Doppler signal, which could  indicate a lack of flow or technical issues. The right VA flow direction and  velocity is normal. Insonation at multiple depths was attempted of the  [facility]. Technically limited due to an  inadequate right temporal window. The velocities that could be detected were  below normal, but this is not atypical for advanced age. No evidence of  stenosis, spasm, or flow reversal.   Lab Review Component     Latest Ref Rng 12/21/2015 12/22/2015  Tube #         Color, CSF      COLORLESS    Appearance, CSF     CLEAR    Supernatant         RBC Count, CSF     0 /cu mm    WBC, CSF     0 - 5 /cu mm    Other Cells, CSF  Cholesterol     0 - 200 mg/dL  128  Triglycerides     <150 mg/dL  129  HDL Cholesterol     >40 mg/dL  29 (L)  Total CHOL/HDL Ratio       4.4  VLDL     0 - 40 mg/dL  26  LDL (calc)     0 - 99 mg/dL  73  Specimen Description         Special Requests         Gram Stain         Culture         Report Status         Specimen source hsv         HSV 1 DNA     Negative    HSV 2 DNA     Negative    Hemoglobin A1C     4.8 - 5.6 % 6.0 (H)   Mean Plasma Glucose      126   Crypto Ag     NEGATIVE    Cryptococcal Ag Titer     NOT INDICATED    Miscellaneous Test         Miscellaneous Test Results         Vitamin B12     180 - 914 pg/mL  315  TSH     0.350 - 4.500 uIU/mL  1.217  RPR     Non Reactive  Non Reactive  Homocysteine     0.0 - 15.0 umol/L  12.5  CRP     <1.0 mg/dL  0.7  Sed Rate     0 - 16 mm/hr  7  ANA Ab, IFA       Negative  HIV     Non Reactive  Non Reactive  Glucose, CSF     40 - 70 mg/dL    Total  Protein, CSF     15 - 45 mg/dL     Component     Latest Ref Rng 12/25/2015  Tube #      3  Color, CSF     COLORLESS COLORLESS  Appearance, CSF     CLEAR CLEAR  Supernatant      NOT INDICATED  RBC Count, CSF     0 /cu mm 430 (H)  WBC, CSF     0 - 5 /cu mm 8 (H)  Other Cells, CSF      TOO FEW TO COUNT, SMEAR AVAILABLE FOR REVIEW  Cholesterol     0 - 200 mg/dL   Triglycerides     <150 mg/dL   HDL Cholesterol     >40 mg/dL   Total CHOL/HDL Ratio        VLDL     0 - 40 mg/dL   LDL (calc)     0 - 99 mg/dL   Specimen Description      CSF  Special Requests      NONE  Gram Stain      NO ORGANISMS SEEN . . .  Culture      NO GROWTH 3 DAYS . . .  Report Status      12/29/2015 FINAL  Specimen source hsv      CSF  HSV 1 DNA     Negative Negative  HSV 2 DNA     Negative Negative   Hemoglobin A1C     4.8 - 5.6 %   Mean Plasma Glucose  Crypto Ag     NEGATIVE NEGATIVE  Cryptococcal Ag Titer     NOT INDICATED NOT INDICATED  Miscellaneous Test      TEST 11194 PROTEIN 14 3 3   Miscellaneous Test Results      SEE SEPARATE REPORT  Vitamin B12     180 - 914 pg/mL   TSH     0.350 - 4.500 uIU/mL   RPR     Non Reactive   Homocysteine     0.0 - 15.0 umol/L   CRP     <1.0 mg/dL   Sed Rate     0 - 16 mm/hr   ANA Ab, IFA        HIV     Non Reactive   Glucose, CSF     40 - 70 mg/dL 63  Total  Protein, CSF     15 - 45 mg/dL 83 (H)    Assessment and Plan:   In summary, Tyler Mejia is a 80 y.o. male with PMH of HTN, CAD s/p CABG in 08/2015 with transient psot-op afib who was admitted to [facility] on 12/21/15 for acute to subacute onset confusion with personality changes. MRI showed bilateral DMI restriction of GP, right stronger signal than left, as well as extensive periventricular WM ischemic changes. intially MRI concerning for CO poisoning and other metabolic etiology. MRA left M1 distal mild to moderate stenosis. CUS negative. Had LP showed elevated protein 83 and 430 RBCs. He was transferred to [facility] for further workup. Repeat MRI brain at [facility] showed diffusion restriction of the left medial basal ganglia consistent with acute-subacute stroke and remote right medial basal ganglia stroke and thalamic lacunar strokes. MRA neck and brain was negative. Heavy metal urine studies negative. Due to the highly asymmetric nature of his lesions, toxic metabolic etiology was felt to be highly unlikely. His MRI findings and clinical presentation were felt to be secondary to acute left basal ganglia stroke in the setting of remote right basal ganglia stroke and thus decompensation of this system due to bilateral lesions. LDL of 63, A1C 6.2. Statins were recommended however the family refused given prior history of severe myalgias due to statin. TTE showed EF of 55-60% with no  shunt and mildly dilated LA. The etiology of his stroke was felt to be secondary to microvascular disease and due to the presence of existing cardiac atherosclerotic disease we felt that DAPT was indicated. His home aspirin 325mg  was decreased to 81mg  and plavix 75mg  daily added. He was also started on amantadine to improve alertness. He was discharged to ALF. As per daughter and son, he is currently more awake alert but still has social withdrawal and personality changes, more docile and limited speech output. Currently, he resides at memory unit, but relatively high functioning. Family refused statin, aricept or nemanda at this time. MMSE 16/30.   Plan: - continue ASA and plavix for stroke and cardiac prevention - check BP at facility daily. - Follow up with your primary care physician for stroke risk factor modification. Recommend maintain blood pressure goal <130/80, diabetes with hemoglobin A1c goal below 6.5% and lipids with LDL cholesterol goal below 70 mg/dL.  - hold off dementia meds and statin for now.  - continue supportive care. - follow up in 6 months.  I spent more than 25 minutes of face to face time with the patient. Greater than 50% of time was spent in counseling and coordination of care. We discussed dementia natural history, medication  treatment and social support.    No orders of the defined types were placed in this encounter.    Meds ordered this encounter  Medications  . loperamide (IMODIUM) 2 MG capsule    Sig:     Refill:  10    Patient Instructions  - continue ASA and plavix for stroke and cardiac prevention - check BP at facility daily. - Follow up with your primary care physician for stroke risk factor modification. Recommend maintain blood pressure goal <130/80, diabetes with hemoglobin A1c goal below 6.5% and lipids with LDL cholesterol goal below 70 mg/dL.  - hold off dementia meds for now.  - continue supportive care. - follow up in 6  months.    Rosalin Hawking, MD PhD [facility] [address] [place], [place] [address] [phone]

## 2016-05-21 NOTE — Patient Instructions (Signed)
-   continue ASA and plavix for stroke and cardiac prevention - check BP at facility daily. - Follow up with your primary care physician for stroke risk factor modification. Recommend maintain blood pressure goal <130/80, diabetes with hemoglobin A1c goal below 6.5% and lipids with LDL cholesterol goal below 70 mg/dL.  - hold off dementia meds for now.  - continue supportive care. - follow up in 6 months.

## 2016-06-04 DIAGNOSIS — I639 Cerebral infarction, unspecified: Secondary | ICD-10-CM | POA: Diagnosis not present

## 2016-06-04 DIAGNOSIS — K219 Gastro-esophageal reflux disease without esophagitis: Secondary | ICD-10-CM | POA: Diagnosis not present

## 2016-08-26 ENCOUNTER — Encounter: Payer: Self-pay | Admitting: Neurology

## 2016-09-05 DIAGNOSIS — Z8673 Personal history of transient ischemic attack (TIA), and cerebral infarction without residual deficits: Secondary | ICD-10-CM | POA: Diagnosis not present

## 2016-09-05 DIAGNOSIS — I2510 Atherosclerotic heart disease of native coronary artery without angina pectoris: Secondary | ICD-10-CM | POA: Diagnosis not present

## 2016-09-05 DIAGNOSIS — Z23 Encounter for immunization: Secondary | ICD-10-CM | POA: Diagnosis not present

## 2016-09-05 IMAGING — DX DG CHEST 2V
2 series · 2 of 2 positions shown · non-contrast
Comparison: 02/28/2012 chest radiograph

CLINICAL DATA: Shortness of breath.  Preoperative.

EXAM:
CHEST  2 VIEW

[chest pa]
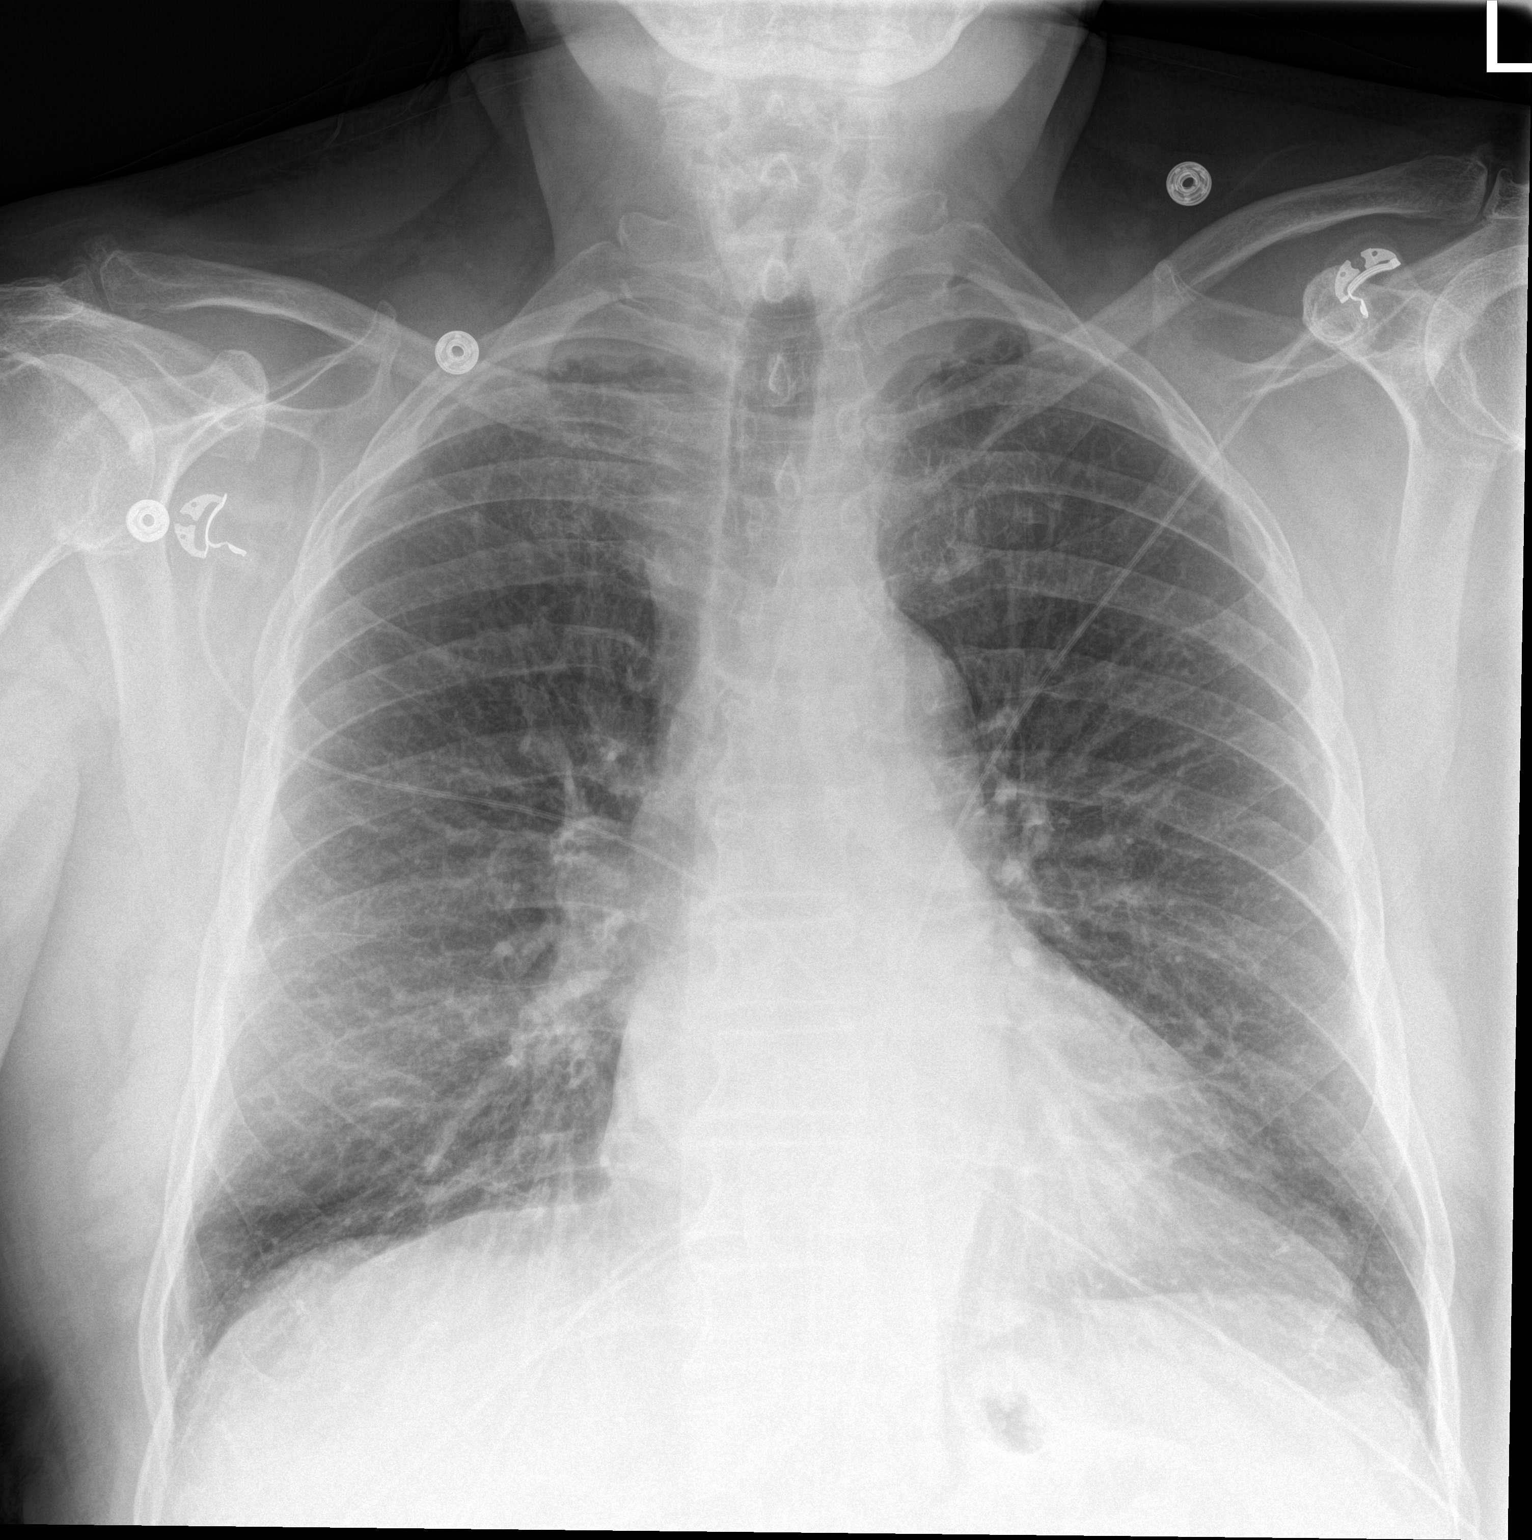

[chest lat]
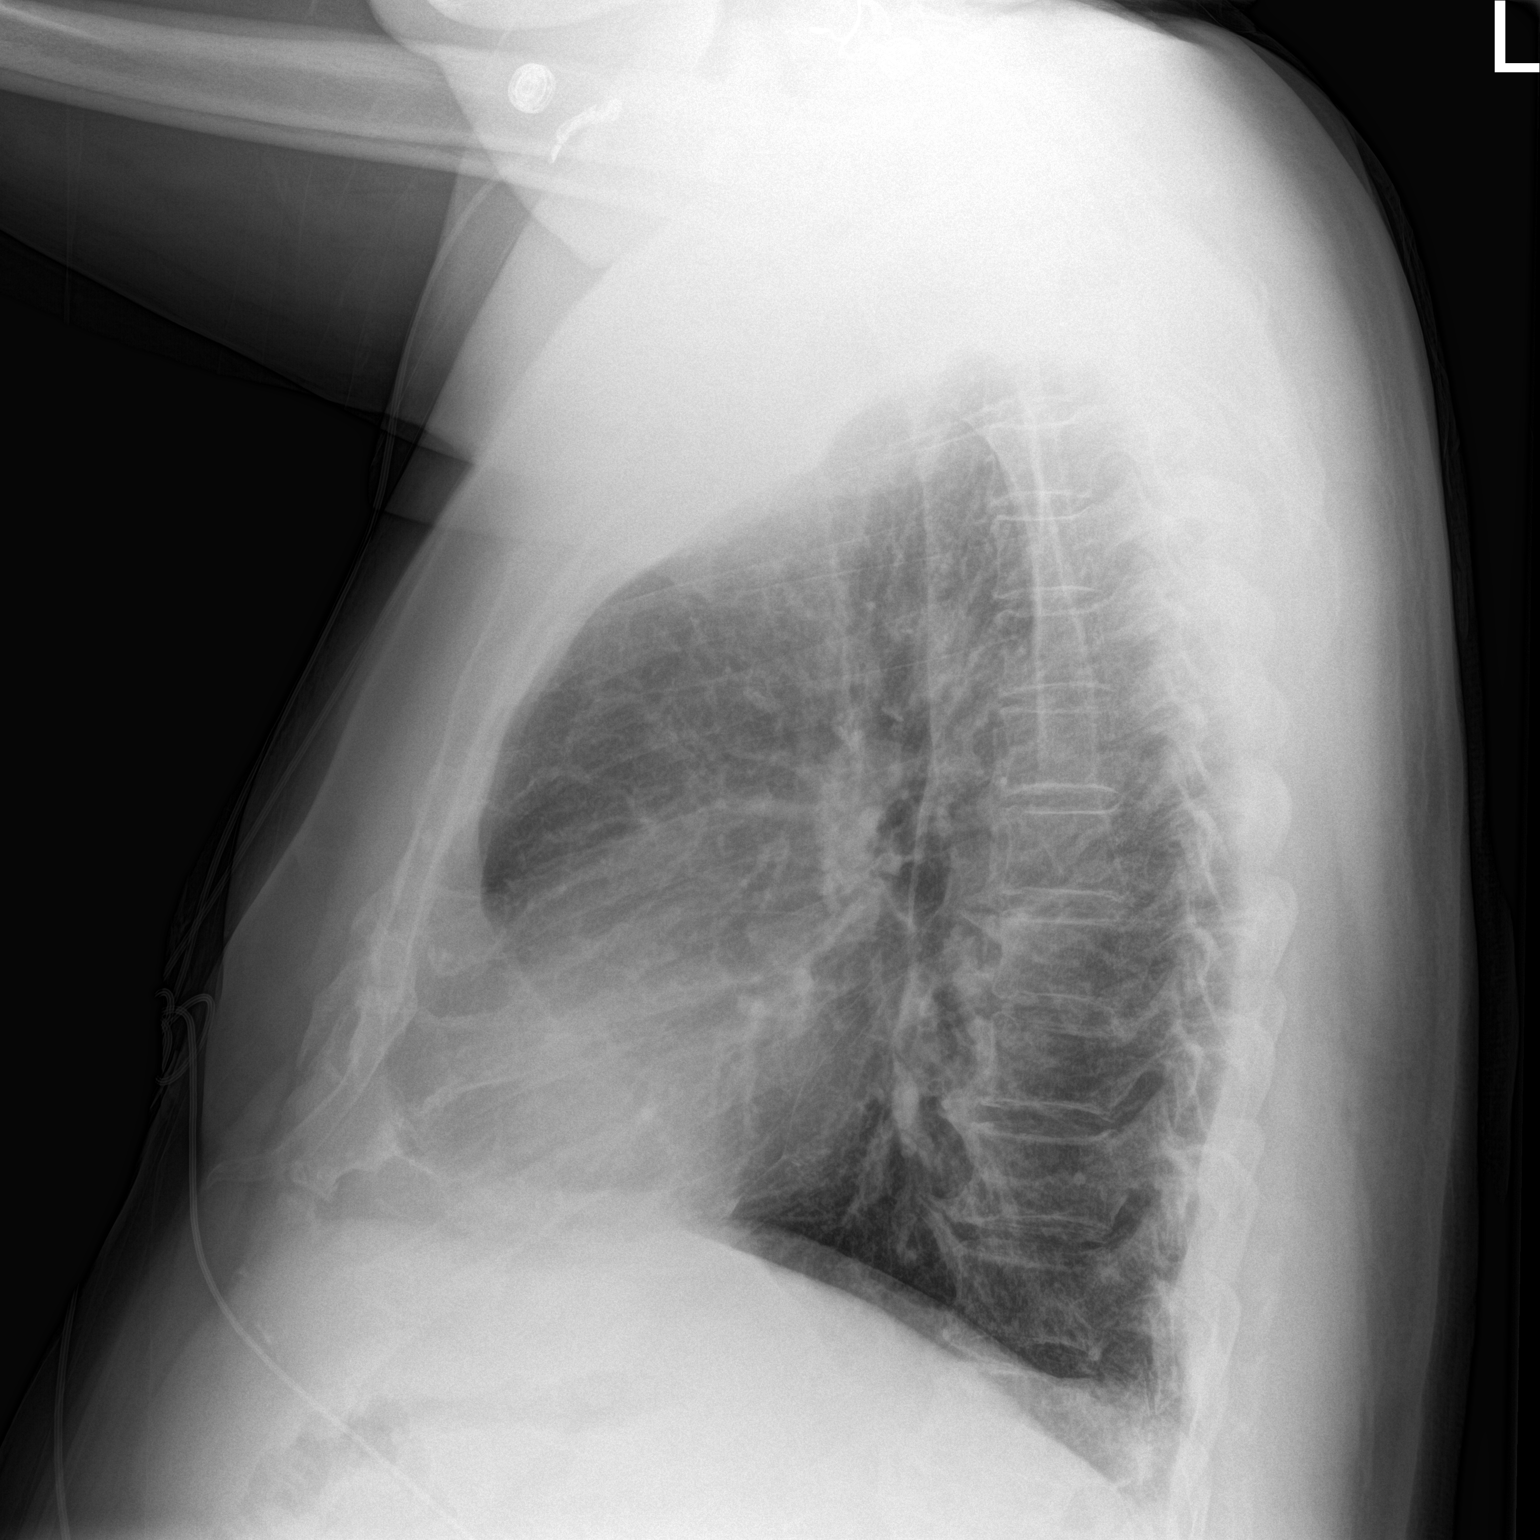

[2 of 2 positions shown; findings below may reference images not displayed]

FINDINGS: The heart is top-normal size. The mediastinal contour is stable with
minimal tortuosity of the atherosclerotic thoracic aorta. No
pneumothorax. No pleural effusion. Clear lungs, with no pulmonary
edema. Moderate degenerative changes in the thoracic spine.
IMPRESSION: No active disease in the chest.

## 2016-09-06 IMAGING — CR DG CHEST 1V PORT
1 series · 1 of 1 positions shown · non-contrast
Comparison: 08/10/2015

CLINICAL DATA: Post CABG

EXAM:
PORTABLE CHEST - 1 VIEW

[AP]
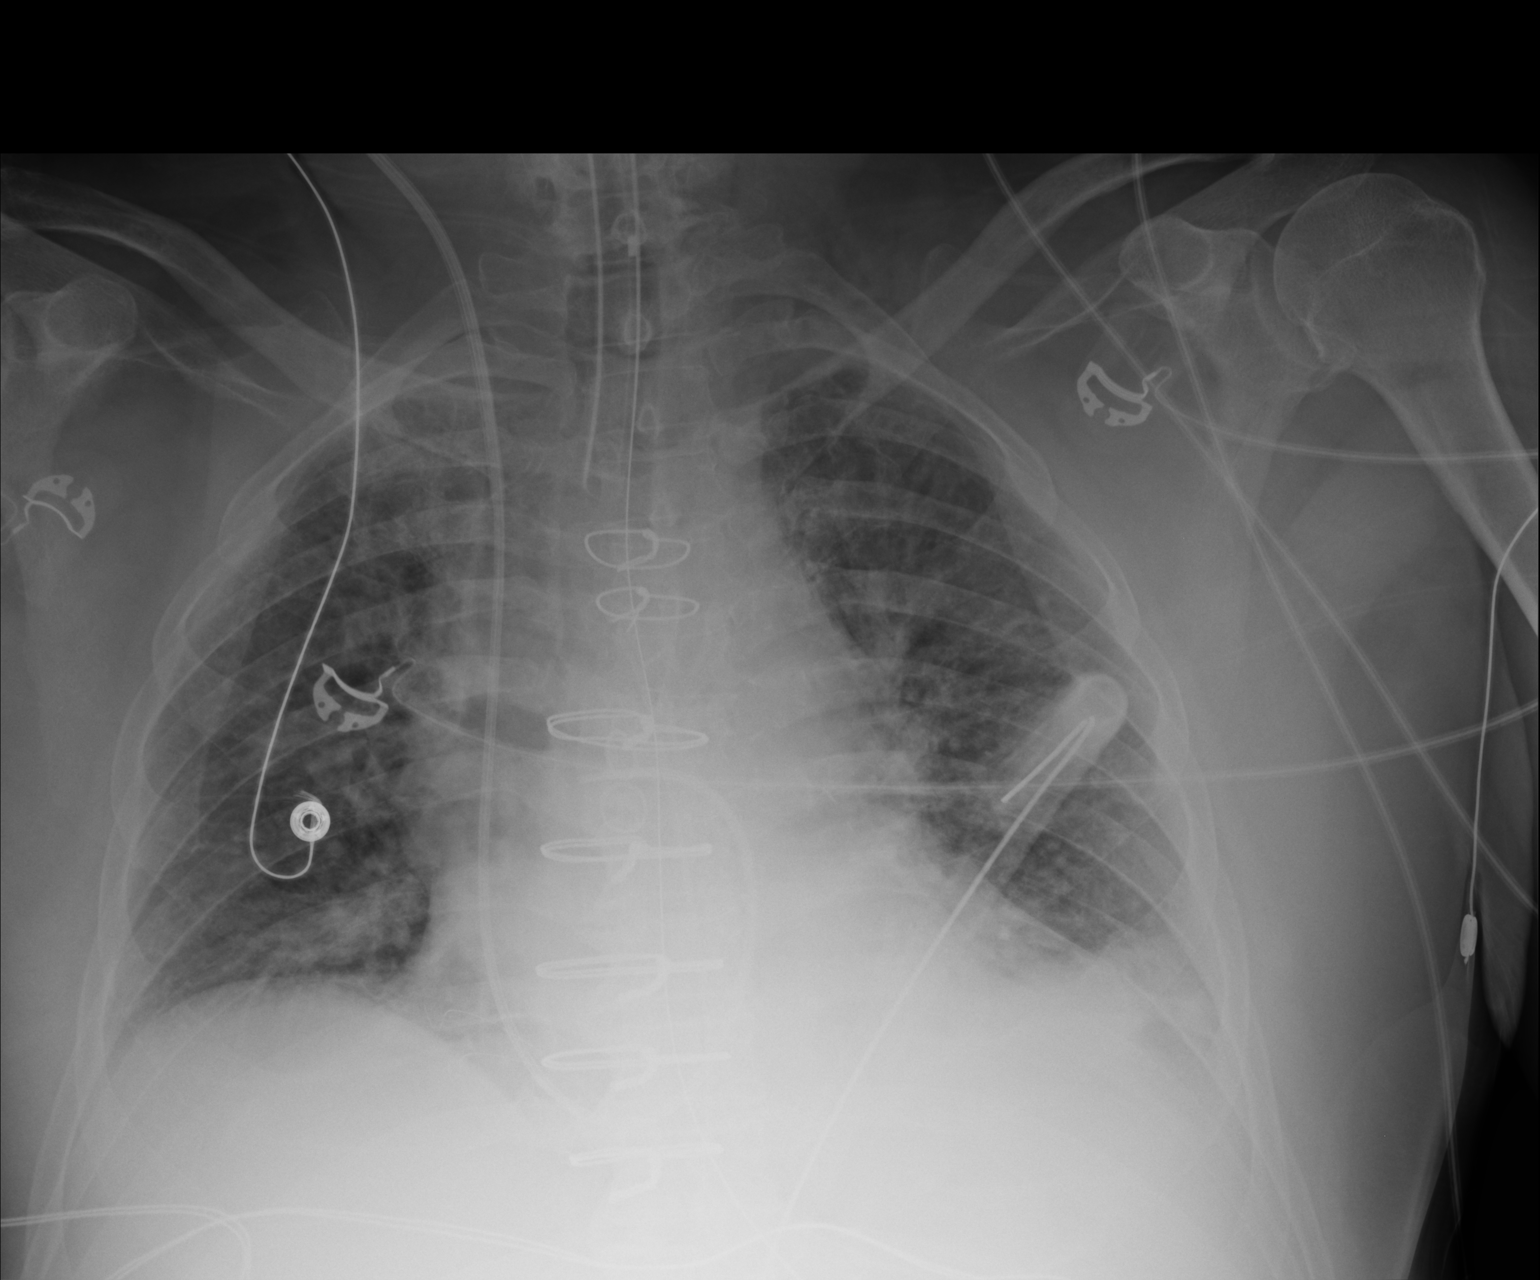

[1 of 1 positions shown; findings below may reference images not displayed]

FINDINGS: Endotracheal and nasogastric tubes are appropriately positioned.
Evidence of CABG. Right IJ Swan-Ganz catheter with tip over the main
right pulmonary artery. Patchy bibasilar airspace opacities are
noted with crowding of the lung markings and low volumes, suggesting
atelectasis. Trace pleural effusion. No pneumothorax.
IMPRESSION: Postoperative exam after CABG as above.

## 2016-09-07 IMAGING — CR DG CHEST 1V PORT
1 series · 1 of 1 positions shown · non-contrast
Comparison: 08/11/2015

CLINICAL DATA: CABG

EXAM:
PORTABLE CHEST - 1 VIEW

[AP]
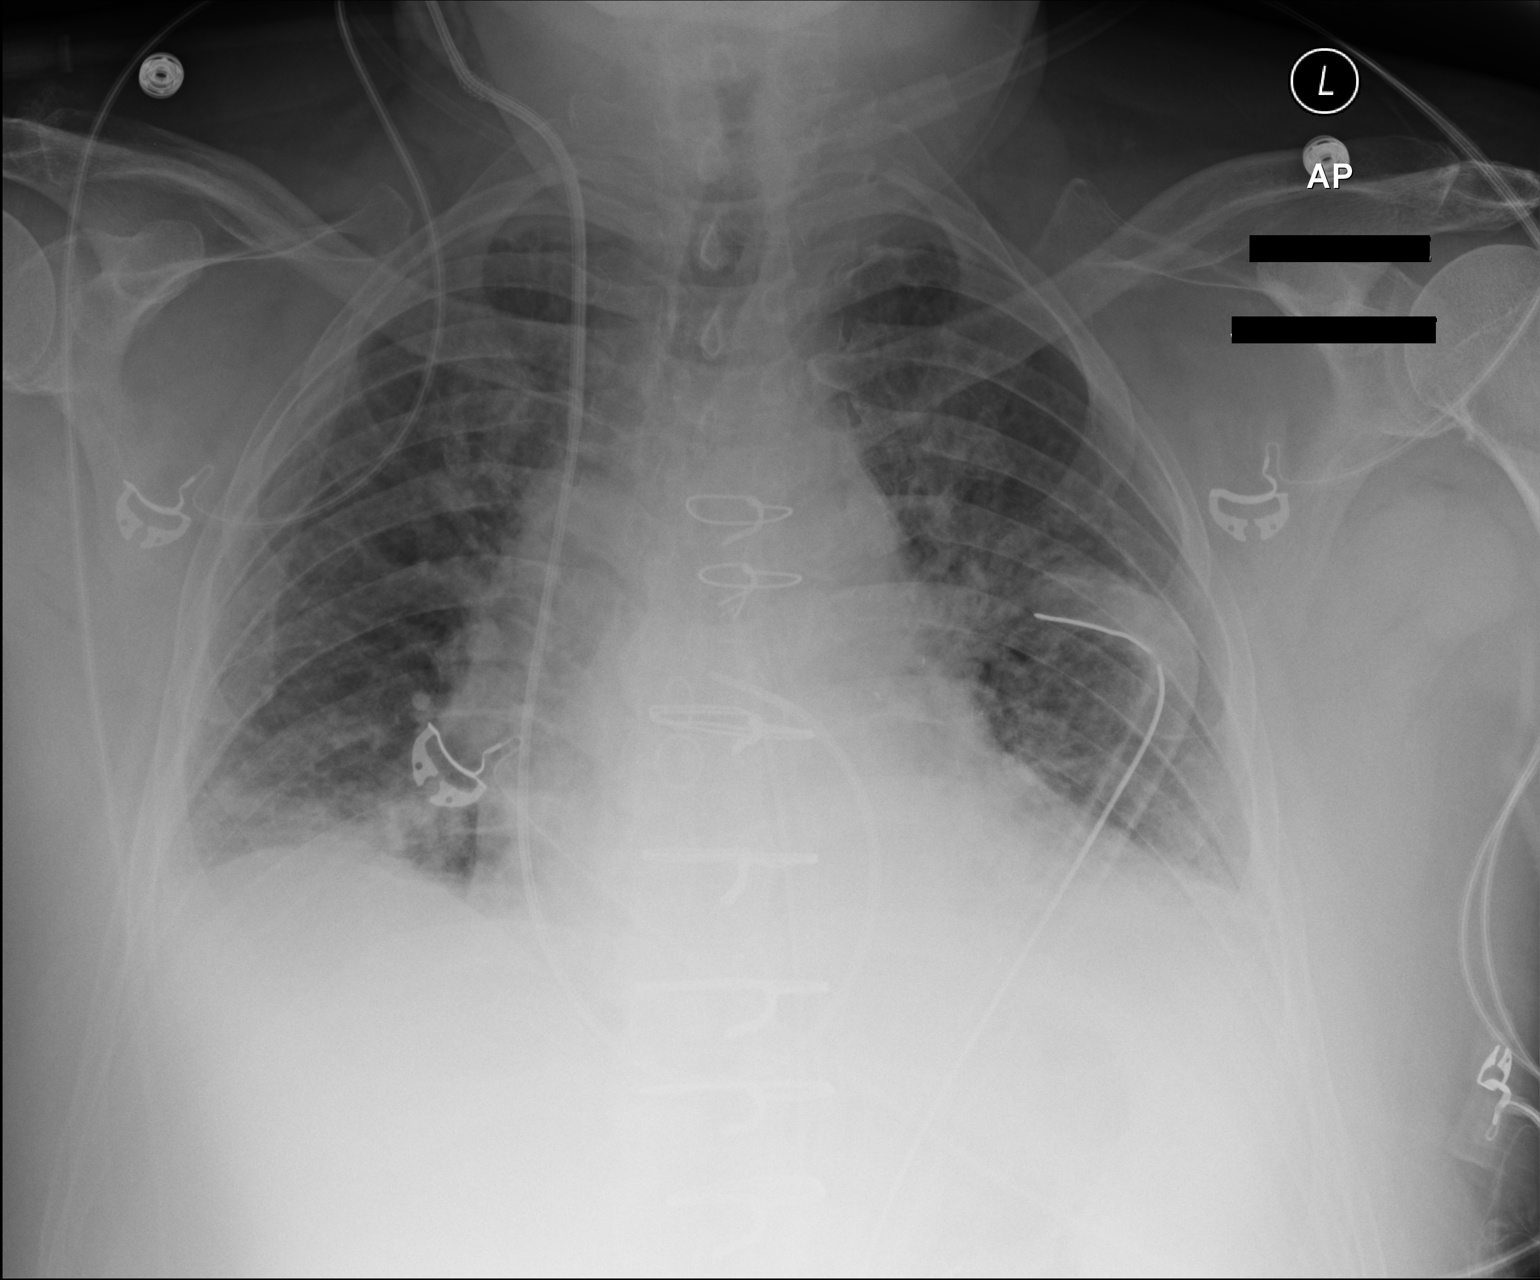

[1 of 1 positions shown; findings below may reference images not displayed]

FINDINGS: Sternotomy wires overlie stable cardiac silhouette. Interval removal
of NG tube and endotracheal tube. Swan-Ganz catheter and RIGHT chest
tube LEFT chest tube remain. There is mild bibasilar atelectasis a
small effusions unchanged from prior. No pneumothorax.
IMPRESSION: 1. Extubation without complication.
2. LEFT chest tube without pneumothorax.

## 2016-09-08 IMAGING — CR DG CHEST 1V PORT
1 series · 1 of 1 positions shown · non-contrast
Comparison: Yesterday.

CLINICAL DATA: Status post CABG.

EXAM:
PORTABLE CHEST - 1 VIEW

[AP]
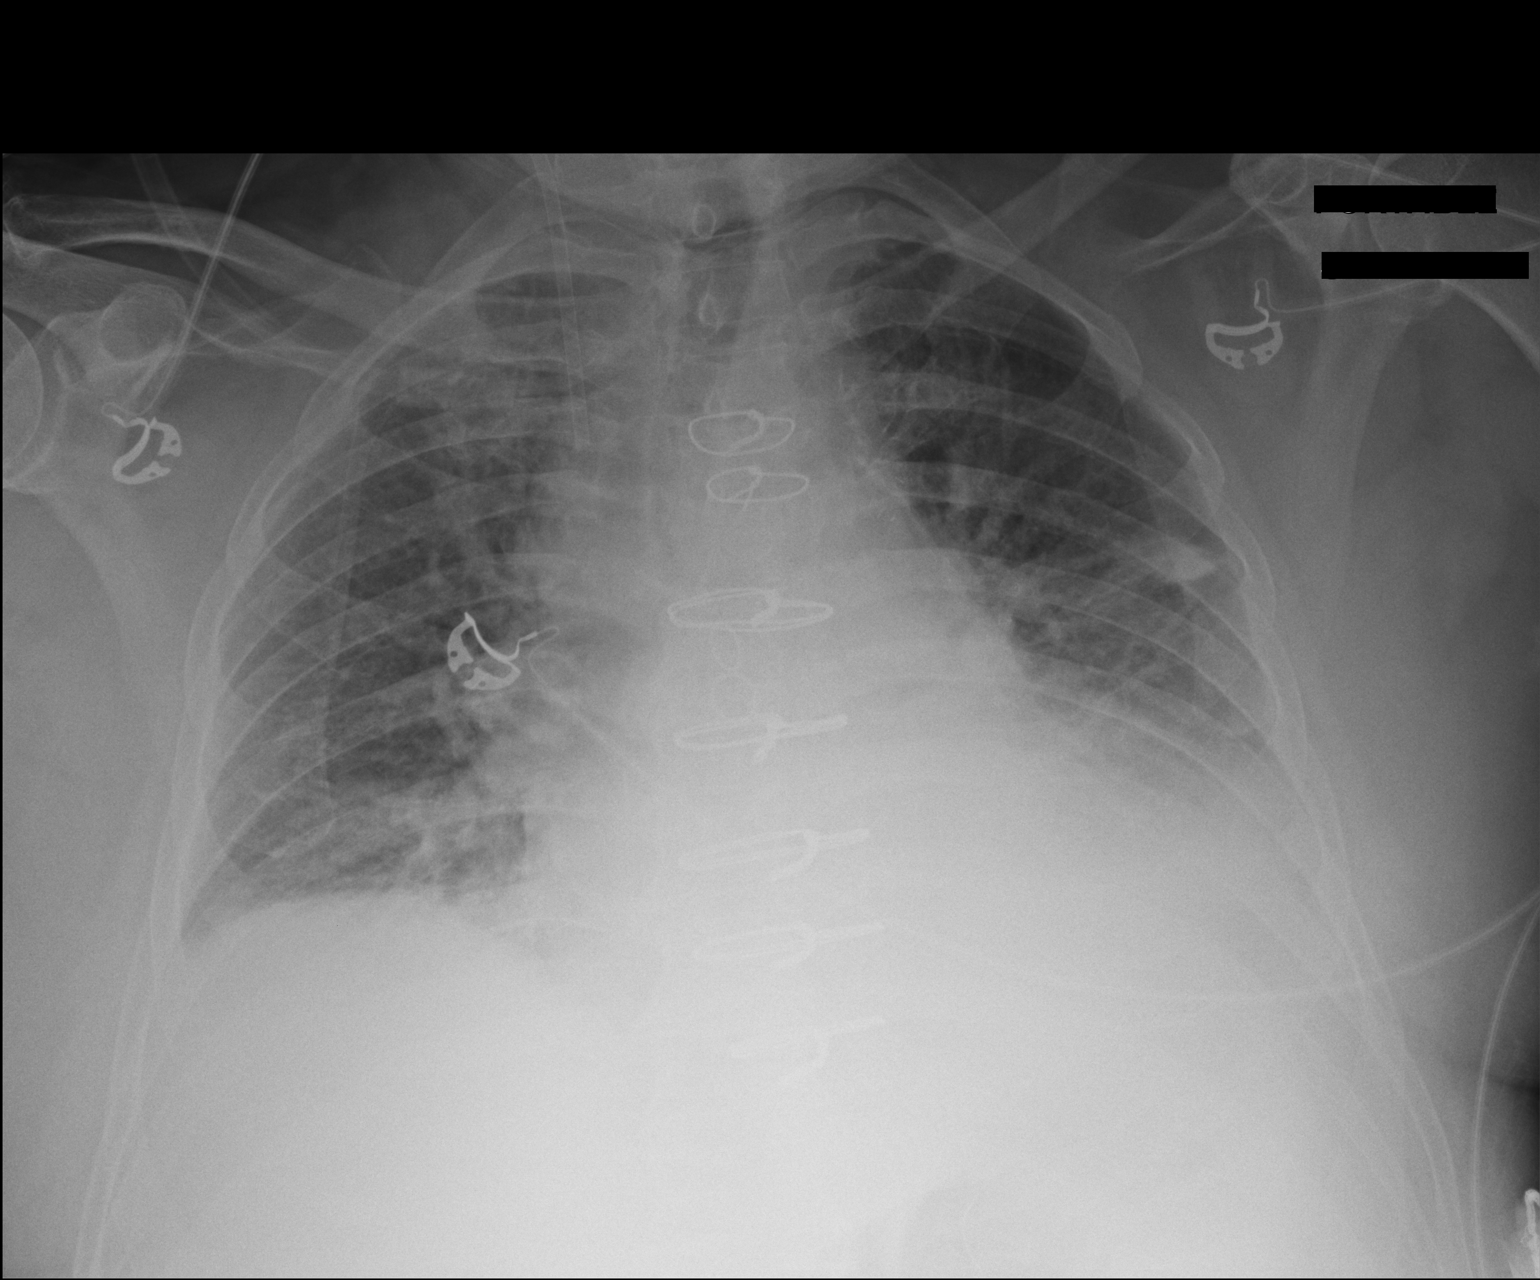

[1 of 1 positions shown; findings below may reference images not displayed]

FINDINGS: Stable enlarged cardiac silhouette and post CABG changes. The right
jugular Swan-Ganz catheter has been removed and the sheath remains
in place. The mediastinal and left chest tubes have been removed. No
pneumothorax. Increased ill-defined airspace opacity at the left
lung base. Interval small amount of linear density in the left mid
lung zone. Decreased right basilar opacity. Stable prominence of the
pulmonary vasculature. Thoracic spine degenerative changes.
IMPRESSION: 1. Increased left basilar atelectasis and possible pneumonia.
2. Decreased right basilar atelectasis.
3. Small amount of linear atelectasis in the left mid lung zone.
4. Stable cardiomegaly and pulmonary vascular congestion.

## 2016-09-09 IMAGING — CR DG CHEST 1V PORT
1 series · 1 of 1 positions shown · non-contrast
Comparison: Yesterday.

CLINICAL DATA: Persistent cough following open heart surgery 3 days
ago.

EXAM:
PORTABLE CHEST - 1 VIEW

[AP]
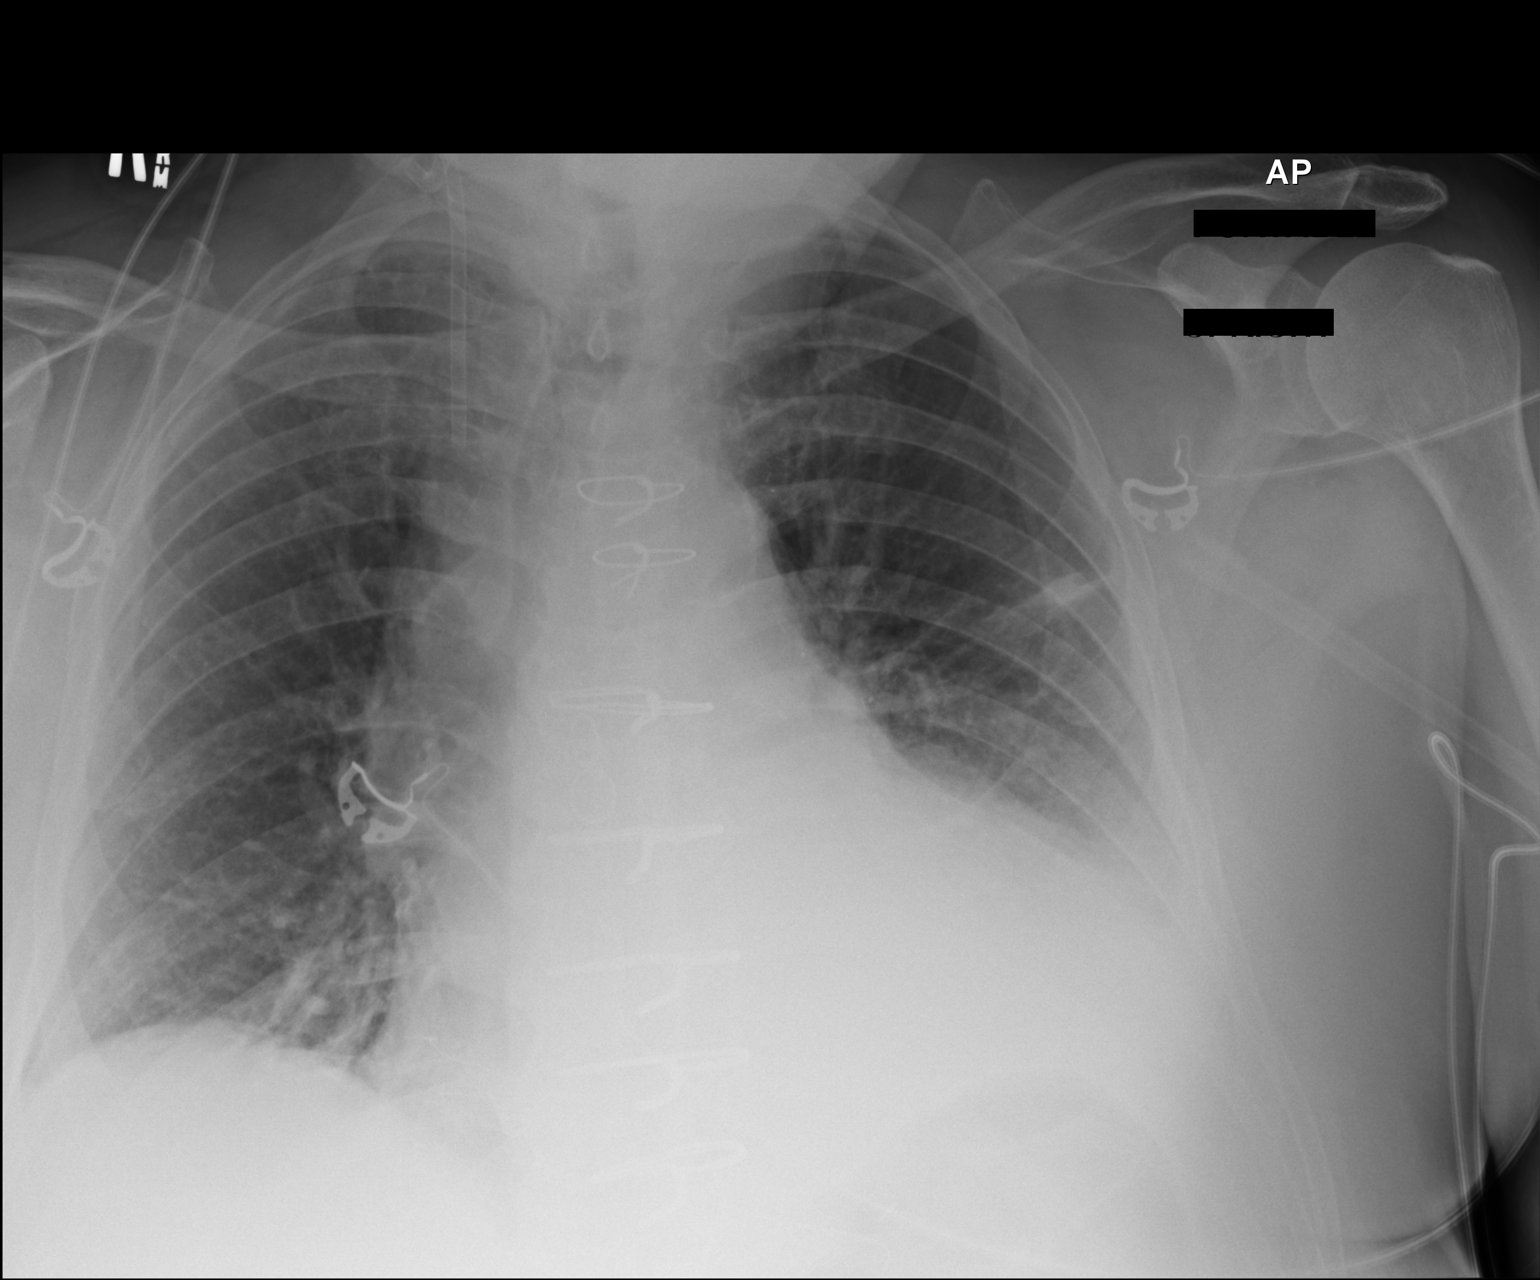

[1 of 1 positions shown; findings below may reference images not displayed]

FINDINGS: The right jugular Swan-Ganz catheter remains in place. Stable
enlarged cardiac silhouette. Mildly increased a dense opacity at the
left lung base. Small left pleural effusion. Stable linear density
in the left mid lung zone. The pulmonary vasculature and
interstitial markings remain prominent. Thoracic spine degenerative
changes.
IMPRESSION: 1. Mildly progressive dense left basilar atelectasis or pneumonia.
2. Small left pleural effusion.
3. Stable cardiomegaly, pulmonary vascular congestion and mild
interstitial lung disease.

## 2016-10-10 DIAGNOSIS — L218 Other seborrheic dermatitis: Secondary | ICD-10-CM | POA: Diagnosis not present

## 2016-11-20 ENCOUNTER — Ambulatory Visit: Payer: Medicare Other | Admitting: Neurology

## 2016-12-17 DIAGNOSIS — M79674 Pain in right toe(s): Secondary | ICD-10-CM | POA: Diagnosis not present

## 2016-12-17 DIAGNOSIS — B351 Tinea unguium: Secondary | ICD-10-CM | POA: Diagnosis not present

## 2016-12-24 DIAGNOSIS — I2510 Atherosclerotic heart disease of native coronary artery without angina pectoris: Secondary | ICD-10-CM | POA: Diagnosis not present

## 2016-12-24 DIAGNOSIS — Z6837 Body mass index (BMI) 37.0-37.9, adult: Secondary | ICD-10-CM | POA: Diagnosis not present

## 2016-12-24 DIAGNOSIS — Z8673 Personal history of transient ischemic attack (TIA), and cerebral infarction without residual deficits: Secondary | ICD-10-CM | POA: Diagnosis not present

## 2017-02-04 DIAGNOSIS — L258 Unspecified contact dermatitis due to other agents: Secondary | ICD-10-CM | POA: Diagnosis not present

## 2017-02-04 DIAGNOSIS — R21 Rash and other nonspecific skin eruption: Secondary | ICD-10-CM | POA: Diagnosis not present

## 2017-02-04 DIAGNOSIS — L308 Other specified dermatitis: Secondary | ICD-10-CM | POA: Diagnosis not present

## 2017-02-04 DIAGNOSIS — L304 Erythema intertrigo: Secondary | ICD-10-CM | POA: Diagnosis not present

## 2017-03-04 DIAGNOSIS — M79674 Pain in right toe(s): Secondary | ICD-10-CM | POA: Diagnosis not present

## 2017-03-04 DIAGNOSIS — B351 Tinea unguium: Secondary | ICD-10-CM | POA: Diagnosis not present

## 2017-03-07 ENCOUNTER — Emergency Department (HOSPITAL_COMMUNITY): Payer: Medicare Other

## 2017-03-07 ENCOUNTER — Emergency Department (HOSPITAL_COMMUNITY)
Admission: EM | Admit: 2017-03-07 | Discharge: 2017-03-07 | Disposition: A | Discharge status: Home / Self Care | Payer: Medicare Other | Attending: Emergency Medicine | Admitting: Emergency Medicine

## 2017-03-07 ENCOUNTER — Encounter (HOSPITAL_COMMUNITY): Payer: Self-pay | Admitting: Emergency Medicine

## 2017-03-07 DIAGNOSIS — Z951 Presence of aortocoronary bypass graft: Secondary | ICD-10-CM | POA: Insufficient documentation

## 2017-03-07 DIAGNOSIS — G5631 Lesion of radial nerve, right upper limb: Secondary | ICD-10-CM

## 2017-03-07 DIAGNOSIS — Z8673 Personal history of transient ischemic attack (TIA), and cerebral infarction without residual deficits: Secondary | ICD-10-CM | POA: Diagnosis not present

## 2017-03-07 DIAGNOSIS — I10 Essential (primary) hypertension: Secondary | ICD-10-CM | POA: Diagnosis not present

## 2017-03-07 DIAGNOSIS — J449 Chronic obstructive pulmonary disease, unspecified: Secondary | ICD-10-CM | POA: Diagnosis not present

## 2017-03-07 DIAGNOSIS — Z79899 Other long term (current) drug therapy: Secondary | ICD-10-CM | POA: Diagnosis not present

## 2017-03-07 DIAGNOSIS — R791 Abnormal coagulation profile: Secondary | ICD-10-CM | POA: Diagnosis not present

## 2017-03-07 DIAGNOSIS — Z87891 Personal history of nicotine dependence: Secondary | ICD-10-CM | POA: Diagnosis not present

## 2017-03-07 DIAGNOSIS — R471 Dysarthria and anarthria: Secondary | ICD-10-CM | POA: Diagnosis not present

## 2017-03-07 DIAGNOSIS — I252 Old myocardial infarction: Secondary | ICD-10-CM | POA: Insufficient documentation

## 2017-03-07 DIAGNOSIS — R531 Weakness: Secondary | ICD-10-CM | POA: Diagnosis not present

## 2017-03-07 DIAGNOSIS — I2510 Atherosclerotic heart disease of native coronary artery without angina pectoris: Secondary | ICD-10-CM | POA: Diagnosis not present

## 2017-03-07 LAB — DIFFERENTIAL
BASOS PCT: 0 %
Basophils Absolute: 0 10*3/uL (ref 0.0–0.1)
EOS PCT: 2 %
Eosinophils Absolute: 0.2 10*3/uL (ref 0.0–0.7)
LYMPHS PCT: 20 %
Lymphs Abs: 1.9 10*3/uL (ref 0.7–4.0)
MONO ABS: 0.8 10*3/uL (ref 0.1–1.0)
MONOS PCT: 8 %
NEUTROS ABS: 6.7 10*3/uL (ref 1.7–7.7)
Neutrophils Relative %: 70 %

## 2017-03-07 LAB — I-STAT CHEM 8, ED
BUN: 21 mg/dL — ABNORMAL HIGH (ref 6–20)
Calcium, Ion: 1.2 mmol/L (ref 1.15–1.40)
Chloride: 102 mmol/L (ref 101–111)
Creatinine, Ser: 1 mg/dL (ref 0.61–1.24)
GLUCOSE: 106 mg/dL — AB (ref 65–99)
HCT: 49 % (ref 39.0–52.0)
HEMOGLOBIN: 16.7 g/dL (ref 13.0–17.0)
Potassium: 4 mmol/L (ref 3.5–5.1)
SODIUM: 141 mmol/L (ref 135–145)
TCO2: 28 mmol/L (ref 0–100)

## 2017-03-07 LAB — CBC
HEMATOCRIT: 48.6 % (ref 39.0–52.0)
Hemoglobin: 16.1 g/dL (ref 13.0–17.0)
MCH: 28.5 pg (ref 26.0–34.0)
MCHC: 33.1 g/dL (ref 30.0–36.0)
MCV: 86.2 fL (ref 78.0–100.0)
PLATELETS: 182 10*3/uL (ref 150–400)
RBC: 5.64 MIL/uL (ref 4.22–5.81)
RDW: 13.3 % (ref 11.5–15.5)
WBC: 9.7 10*3/uL (ref 4.0–10.5)

## 2017-03-07 LAB — COMPREHENSIVE METABOLIC PANEL
ALK PHOS: 67 U/L (ref 38–126)
ALT: 26 U/L (ref 17–63)
ANION GAP: 8 (ref 5–15)
AST: 29 U/L (ref 15–41)
Albumin: 3.9 g/dL (ref 3.5–5.0)
BUN: 18 mg/dL (ref 6–20)
CALCIUM: 9.6 mg/dL (ref 8.9–10.3)
CO2: 27 mmol/L (ref 22–32)
CREATININE: 1.13 mg/dL (ref 0.61–1.24)
Chloride: 103 mmol/L (ref 101–111)
GFR calc Af Amer: >60 mL/min (ref >60)
Glucose, Bld: 110 mg/dL — ABNORMAL HIGH (ref 65–99)
Potassium: 4.1 mmol/L (ref 3.5–5.1)
SODIUM: 138 mmol/L (ref 135–145)
TOTAL PROTEIN: 7.3 g/dL (ref 6.5–8.1)
Total Bilirubin: 1.8 mg/dL — ABNORMAL HIGH (ref 0.3–1.2)

## 2017-03-07 LAB — PROTIME-INR
INR: 1.02
PROTHROMBIN TIME: 13.4 s (ref 11.4–15.2)

## 2017-03-07 LAB — I-STAT TROPONIN, ED: TROPONIN I, POC: 0 ng/mL (ref 0.00–0.08)

## 2017-03-07 LAB — APTT: aPTT: 31 seconds (ref 24–36)

## 2017-03-07 NOTE — ED Notes (Signed)
Pt's diaper changed, pt cleaned and Depend diaper from home placed.  Perineal area is red and painful, son reports pt is being treated at [facility] for same.

## 2017-03-07 NOTE — ED Provider Notes (Signed)
[facility] DEPT Provider Note   CSN: [id number] Arrival date & time: 03/07/17  0901     History   Chief Complaint Chief Complaint  Patient presents with  . Weakness    HPI Tyler Mejia is a 81 y.o. male.  Patient awoke this morning with weakness and ability to use right arm.  His son came to his nursing care facility and helped him eat, because he could not use his right arm to do that.  He was able to eat, when his son helped him.  He did not choke.  He was able to walk per his normal.  He is somewhat lethargic however the son states that this is usual for him.  There has been no other change, abnormality, but the son is aware of.  There is been no reported seizure.  Son states the patient has cognitive dysfunction following his recent stroke one year ago.  There are no other known reported recent changes in his health, or symptom complex.   Level 5 caveat-altered mental status  HPI  Past Medical History:  Diagnosis Date  . Atrial fibrillation, transient ([place])    a. Post-op from CABG 08/2015.  Marland Kitchen CAD (coronary artery disease)    a. DES to the RCA March 2013 with residual dz rx medically. b. s/p CABGx4 in 08/2015.  . Collagen vascular disease ([place])   . COPD (chronic obstructive pulmonary disease) ([place])   . Essential hypertension   . GERD (gastroesophageal reflux disease)   . History of stroke    January 2017  . HLD (hyperlipidemia)    a. H/o multiple statin intolerances.  Marland Kitchen Hx of adenomatous colonic polyps   . IBS (irritable bowel syndrome)   . Nephrolithiasis   . NSTEMI (non-ST elevated myocardial infarction) [facility]) March 2013  . Obesity   . Stroke [facility])     Patient Active Problem List   Diagnosis Date Noted  . Dementia 05/21/2016  . Coronary artery disease involving coronary bypass graft of native heart with unspecified angina pectoris 02/29/2016  . Cerebrovascular accident (CVA) due to bilateral thrombosis of middle cerebral arteries ([place]) 02/29/2016  . CVA  (cerebral infarction) 12/21/2015  . Altered mental status 12/21/2015  . Ileus, postoperative ([place]) 08/23/2015  . S/P CABG x 4 08/11/2015  . Obesity 08/10/2015  . Accelerating angina ([place]) 08/09/2015  . Mucosal abnormality of esophagus   . Hiatal hernia   . Dysphagia 03/15/2015  . Schatzki's ring 03/15/2015  . COPD (chronic obstructive pulmonary disease) ([place]) 08/21/2012  . Coronary artery disease involving native coronary artery 05/20/2012  . HLD (hyperlipidemia) 04/10/2012  . Shortness of breath 02/28/2012  . Essential hypertension 02/28/2012    Past Surgical History:  Procedure Laterality Date  . APPENDECTOMY    . BACK SURGERY    . CARDIAC CATHETERIZATION N/A 08/09/2015   Procedure: Left Heart Cath and Coronary Angiography;  Surgeon: Jettie Booze, MD;  Location: [facility] CV LAB;  Service: Cardiovascular;  Laterality: N/A;  . COLONOSCOPY  08/21/2004   Pedunculated polyp at the splenic flexure  . COLONOSCOPY  09/2009   Dr. Rourk--> Cecal and hepatic flexure polyps, tubular adenomas. Next colonoscopy October 2013  . COLONOSCOPY N/A 05/07/2013   RMR: colonic polyp/colonic diverticulosis  . CORONARY ARTERY BYPASS GRAFT N/A 08/11/2015   Procedure: CORONARY ARTERY BYPASS GRAFTING (CABG) X4 UTILIZING THE LEFT INTERNAL MAMMARY ARTERY TO LAD AND ENDOSCOPICALLY HARVESTED BILATERAL SAPHENEOUS VEINS TO DIAGONAL,OM1 AND OM2;  Surgeon: Ivin Poot, MD;  Location: [facility];  Service: Open Heart Surgery;  Laterality: N/A;  . ESOPHAGOGASTRODUODENOSCOPY  2003   distal erosion/small hiatal hernia  . ESOPHAGOGASTRODUODENOSCOPY  09/2009   Dr. Evalee Mutton ring, mild distal esophageal erosions, status post Maloney dilation, small hiatal hernia.  Marland Kitchen ESOPHAGOGASTRODUODENOSCOPY N/A 03/16/2015   IWL:NLGXQ HH/focal area of distal esophageal s/p bx  . LEFT HEART CATHETERIZATION WITH CORONARY ANGIOGRAM N/A 03/02/2012   Procedure: LEFT HEART CATHETERIZATION WITH CORONARY ANGIOGRAM;  Surgeon: Peter M  Martinique, MD;  Location: [facility] CATH LAB;  Service: Cardiovascular;  Laterality: N/A;  . PERCUTANEOUS CORONARY STENT INTERVENTION (PCI-S) N/A 03/02/2012   Procedure: PERCUTANEOUS CORONARY STENT INTERVENTION (PCI-S);  Surgeon: Peter M Martinique, MD;  Location: [facility] CATH LAB;  Service: Cardiovascular;  Laterality: N/A;  . TEE WITHOUT CARDIOVERSION N/A 08/11/2015   Procedure: TRANSESOPHAGEAL ECHOCARDIOGRAM (TEE);  Surgeon: Ivin Poot, MD;  Location: [facility];  Service: Open Heart Surgery;  Laterality: N/A;       Home Medications    Prior to Admission medications   Medication Sig Start Date End Date Taking? Authorizing Provider  acetaminophen (TYLENOL) 325 MG tablet Take 2 tablets (650 mg total) by mouth every 6 (six) hours as needed for mild pain, fever or headache. Patient taking differently: Take 325 mg by mouth every 6 (six) hours as needed for mild pain or fever.  08/24/15  Yes Erin R Barrett, PA-C  clobetasol cream (TEMOVATE) 1.19 % Apply 1 application topically 2 (two) times daily as needed (irritated scalp).   Yes Historical Provider, MD  hydrocortisone cream 1 % Apply 1 application topically 2 (two) times daily as needed for itching.   Yes Historical Provider, MD  ketoconazole (NIZORAL) 2 % cream Apply 1 application topically 2 (two) times daily.   Yes Historical Provider, MD  loperamide (IMODIUM A-D) 2 MG tablet Take 2 mg by mouth daily.   Yes Historical Provider, MD  mometasone (ELOCON) 0.1 % cream Apply 1 application topically 2 (two) times daily as needed (skin irritation).   Yes Historical Provider, MD  tamsulosin (FLOMAX) 0.4 MG CAPS capsule Take 0.4 mg by mouth daily. 04/12/16  Yes Historical Provider, MD  traZODone (DESYREL) 50 MG tablet Take 25 mg by mouth at bedtime.    Yes Historical Provider, MD  triamcinolone cream (KENALOG) 0.1 % Apply 1 application topically 2 (two) times daily.   Yes Historical Provider, MD  metoprolol tartrate (LOPRESSOR) 25 MG tablet Take 0.5 tablets (12.5 mg total)  by mouth 2 (two) times daily. Patient not taking: Reported on 03/07/2017 08/24/15   Lodema Hong Barrett, PA-C    Family History Family History  Problem Relation Age of Onset  . Pneumonia Father   . Heart disease Mother   . Diabetes Mother   . Stroke Mother   . Stroke Sister   . Breast cancer Sister     2 sisters  . Esophageal cancer Brother   . Colon cancer Neg Hx     Social History Social History  Substance Use Topics  . Smoking status: Former Smoker    Packs/day: 1.50    Years: 45.00    Types: Cigarettes    Quit date: 12/09/2001  . Smokeless tobacco: Never Used     Comment: quit about 9 years ago  . Alcohol use No     Comment: Very rarely     Allergies   Codeine and Statins   Review of Systems Review of Systems  Unable to perform ROS: Mental status change     Physical Exam Updated Vital  Signs BP 125/82   Pulse 87   Temp 97.5 F (36.4 C) (Oral)   Resp 16   SpO2 98%   Physical Exam  Constitutional: He is oriented to person, place, and time. He appears well-developed.  Elderly, frail  HENT:  Head: Normocephalic and atraumatic.  Right Ear: External ear normal.  Left Ear: External ear normal.  Eyes: Conjunctivae and EOM are normal. Pupils are equal, round, and reactive to light.  Neck: Normal range of motion and phonation normal. Neck supple.  Cardiovascular: Normal rate, regular rhythm and normal heart sounds.   Pulmonary/Chest: Effort normal and breath sounds normal. He exhibits no bony tenderness.  Abdominal: Soft. There is no tenderness.  Musculoskeletal: Normal range of motion.  Shuffling gait, stated to be normal by son.  No dysarthria.  No clear aphasia.  He is confused.  Grip strength hands, slightly less right than left.  Unable to dorsiflex the right wrist.  Able to extend the right shoulder, normally.  Neurological: He is alert and oriented to person, place, and time. No cranial nerve deficit or sensory deficit. Coordination normal.  Skin: Skin is  warm, dry and intact.  Psychiatric: He has a normal mood and affect. His behavior is normal. Judgment and thought content normal.  Nursing note and vitals reviewed.    ED Treatments / Results  Labs (all labs ordered are listed, but only abnormal results are displayed) Labs Reviewed  COMPREHENSIVE METABOLIC PANEL - Abnormal; Notable for the following:       Result Value   Glucose, Bld 110 (*)    Total Bilirubin 1.8 (*)    All other components within normal limits  I-STAT CHEM 8, ED - Abnormal; Notable for the following:    BUN 21 (*)    Glucose, Bld 106 (*)    All other components within normal limits  PROTIME-INR  APTT  CBC  DIFFERENTIAL  I-STAT TROPOININ, ED  CBG MONITORING, ED    EKG  EKG Interpretation None       Radiology Ct Head Wo Contrast  Result Date: 03/07/2017 CLINICAL DATA:  Unable T use right hand beginning this morning. EXAM: CT HEAD WITHOUT CONTRAST TECHNIQUE: Contiguous axial images were obtained from the base of the skull through the vertex without intravenous contrast. COMPARISON:  12/20/2016 FINDINGS: Brain: The brainstem and cerebellum are unremarkable. Within the cerebral hemispheres, there are old infarctions within the globus palladi and the left caudate head. There are extensive chronic small vessel ischemic changes throughout the hemispheric deep and subcortical white matter. Many of these changes are progressive since the previous study. There is no clear acute infarction by CT. No mass lesion, hemorrhage, hydrocephalus or extra-axial collection. There is ex vacuo enlargement of the left lateral ventricle since the previous study. Vascular: There is atherosclerotic calcification of the major vessels at the base of the brain. Skull: Negative Sinuses/Orbits: Inflammatory changes of the left maxillary sinus in the sphenoid sinus. Orbits negative. Other: None significant IMPRESSION: No acute finding by CT. Extensive chronic small-vessel ischemic change of the  cerebral hemispheric white matter. Old infarctions affecting the basal ganglia, more extensive on the left than the right. There is progressive encephalomalacia/volume loss in the left basal ganglia region since the previous study related to evolutionary changes of acute infarctions in the left basal ganglia from January 2017. Electronically Signed   By: Nelson Chimes M.D.   On: 03/07/2017 09:50    Procedures Procedures (including critical care time)  Medications Ordered in ED Medications -  No data to display   Initial Impression / Assessment and Plan / ED Course  I have reviewed the triage vital signs and the nursing notes.  Pertinent labs & imaging results that were available during my care of the patient were reviewed by me and considered in my medical decision making (see chart for details).     Medications - No data to display  Patient Vitals for the past 24 hrs:  BP Temp Temp src Pulse Resp SpO2  03/07/17 0909 125/82 97.5 F (36.4 C) Oral 87 16 98 %    10:33 AM Reevaluation with update and discussion. After initial assessment and treatment, an updated evaluation reveals lying supine, comfortable, lucid and interactive.  Neurologic exam essentially the same.  Isolated strength deficit right wrist dorsiflexion.  Continues to have good grip strength of the right hand and able to lift right leg off stretcher, easily.  No asymmetric dysmetria with finger to nose testing.  Family and patient updated on findings.  Male family member states that the patient speech is "somewhat more garbled today."  Will consult neuro hospitalist for possible additional evaluation. Cindi Ghazarian L    15: 30-clinical exam unchanged at this time.  Findings and follow-up recommendations discussed with patient and family members, all questions answered  Final Clinical Impressions(s) / ED Diagnoses   Final diagnoses:  Neuropathy of right radial nerve    Weakness right wrist, and hand, most likely radial  nerve palsy.  CT abnormality, possible persistent with progressive disease, contributory to current findings.  MRI ordered, to evaluate for acute infarct since he had some suggestion for dysarthria.  MRI negative for acute CVA.  No indication for further evaluation in the ED, or hospitalization, at this time.  Nursing Notes Reviewed/ Care Coordinated Applicable Imaging Reviewed Interpretation of Laboratory Data incorporated into ED treatment  The patient appears reasonably screened and/or stabilized for discharge and I doubt any other medical condition or other [facility] requiring further screening, evaluation, or treatment in the ED at this time prior to discharge.  Plan: Home Medications-continue usual medications; Home Treatments-rest, ambulate per usual; return here if the recommended treatment, does not improve the symptoms; Recommended follow up-PCP, and neurology, if not better in 1 week.   New Prescriptions New Prescriptions   No medications on file     Daleen Bo, MD 03/07/17 1539

## 2017-03-07 NOTE — ED Triage Notes (Signed)
Pt reports waking up with right arm weakness this am, states arm felt fine last night, LSN was 2100. Pt a/ox4, resp e/u, face symmetrical.

## 2017-03-07 NOTE — Discharge Instructions (Signed)
Weakness in the right wrist, appears to be secondary to radial nerve palsy.  This is a good chance of improving spontaneously.  If it does not improve within 1 week, call his neurologist Dr. Erlinda Hong.  Return here if needed, for other problems, or concerns.

## 2017-03-07 NOTE — ED Notes (Signed)
Patient transported to MRI on stretcher.

## 2017-05-20 DIAGNOSIS — B351 Tinea unguium: Secondary | ICD-10-CM | POA: Diagnosis not present

## 2017-05-20 DIAGNOSIS — M79675 Pain in left toe(s): Secondary | ICD-10-CM | POA: Diagnosis not present

## 2017-05-20 DIAGNOSIS — M79674 Pain in right toe(s): Secondary | ICD-10-CM | POA: Diagnosis not present

## 2017-05-22 DIAGNOSIS — J690 Pneumonitis due to inhalation of food and vomit: Secondary | ICD-10-CM | POA: Diagnosis not present

## 2017-05-22 DIAGNOSIS — F0150 Vascular dementia without behavioral disturbance: Secondary | ICD-10-CM | POA: Diagnosis not present

## 2017-06-30 DIAGNOSIS — R1310 Dysphagia, unspecified: Secondary | ICD-10-CM | POA: Diagnosis not present

## 2017-06-30 DIAGNOSIS — F0390 Unspecified dementia without behavioral disturbance: Secondary | ICD-10-CM | POA: Diagnosis not present

## 2017-06-30 DIAGNOSIS — K219 Gastro-esophageal reflux disease without esophagitis: Secondary | ICD-10-CM | POA: Diagnosis not present

## 2017-06-30 DIAGNOSIS — I10 Essential (primary) hypertension: Secondary | ICD-10-CM | POA: Diagnosis not present

## 2017-06-30 DIAGNOSIS — Z8673 Personal history of transient ischemic attack (TIA), and cerebral infarction without residual deficits: Secondary | ICD-10-CM | POA: Diagnosis not present

## 2017-06-30 DIAGNOSIS — J449 Chronic obstructive pulmonary disease, unspecified: Secondary | ICD-10-CM | POA: Diagnosis not present

## 2017-07-16 ENCOUNTER — Telehealth (HOSPITAL_COMMUNITY): Payer: Self-pay | Admitting: Pulmonary Disease

## 2017-07-16 NOTE — Telephone Encounter (Signed)
dpr on file for Tyler Mejia (daughter) 08/21/12 Daughter states this is a waste of time and that the home he is in needs to sit with him during mealtime to remind patient not to eat to fast, otherwise he will not remember and  Eats to fast. When taken home for family meals they remind him and he does great.

## 2017-07-17 ENCOUNTER — Telehealth (HOSPITAL_COMMUNITY): Payer: Self-pay | Admitting: Speech Pathology

## 2017-07-17 NOTE — Telephone Encounter (Signed)
Note    dpr on file for Tyler Mejia (daughter) 08/21/12 Daughter states this is a waste of time and that the home he is in needs to sit with him during mealtime to remind patient not to eat to fast, otherwise he will not remember and  Eats to fast. When taken home for family meals they remind him and he does great.

## 2017-08-13 DIAGNOSIS — M79675 Pain in left toe(s): Secondary | ICD-10-CM | POA: Diagnosis not present

## 2017-08-13 DIAGNOSIS — B351 Tinea unguium: Secondary | ICD-10-CM | POA: Diagnosis not present

## 2017-08-13 DIAGNOSIS — M79674 Pain in right toe(s): Secondary | ICD-10-CM | POA: Diagnosis not present

## 2017-09-03 ENCOUNTER — Inpatient Hospital Stay (HOSPITAL_COMMUNITY)
Admission: EM | Admit: 2017-09-03 | Discharge: 2017-09-09 | DRG: 189 | Disposition: A | Discharge status: Skilled Nursing Facility | Payer: Medicare Other | Attending: Internal Medicine | Admitting: Internal Medicine

## 2017-09-03 ENCOUNTER — Emergency Department (HOSPITAL_COMMUNITY): Payer: Medicare Other

## 2017-09-03 ENCOUNTER — Encounter (HOSPITAL_COMMUNITY): Payer: Self-pay | Admitting: Emergency Medicine

## 2017-09-03 DIAGNOSIS — I25119 Atherosclerotic heart disease of native coronary artery with unspecified angina pectoris: Secondary | ICD-10-CM | POA: Diagnosis present

## 2017-09-03 DIAGNOSIS — R9431 Abnormal electrocardiogram [ECG] [EKG]: Secondary | ICD-10-CM | POA: Diagnosis not present

## 2017-09-03 DIAGNOSIS — F0280 Dementia in other diseases classified elsewhere without behavioral disturbance: Secondary | ICD-10-CM | POA: Diagnosis present

## 2017-09-03 DIAGNOSIS — Z8249 Family history of ischemic heart disease and other diseases of the circulatory system: Secondary | ICD-10-CM | POA: Diagnosis not present

## 2017-09-03 DIAGNOSIS — Z833 Family history of diabetes mellitus: Secondary | ICD-10-CM | POA: Diagnosis not present

## 2017-09-03 DIAGNOSIS — J9601 Acute respiratory failure with hypoxia: Principal | ICD-10-CM

## 2017-09-03 DIAGNOSIS — Z885 Allergy status to narcotic agent status: Secondary | ICD-10-CM | POA: Diagnosis not present

## 2017-09-03 DIAGNOSIS — Z9049 Acquired absence of other specified parts of digestive tract: Secondary | ICD-10-CM

## 2017-09-03 DIAGNOSIS — R05 Cough: Secondary | ICD-10-CM | POA: Diagnosis not present

## 2017-09-03 DIAGNOSIS — A419 Sepsis, unspecified organism: Secondary | ICD-10-CM | POA: Diagnosis present

## 2017-09-03 DIAGNOSIS — Z8673 Personal history of transient ischemic attack (TIA), and cerebral infarction without residual deficits: Secondary | ICD-10-CM

## 2017-09-03 DIAGNOSIS — Z9889 Other specified postprocedural states: Secondary | ICD-10-CM | POA: Diagnosis not present

## 2017-09-03 DIAGNOSIS — I517 Cardiomegaly: Secondary | ICD-10-CM | POA: Diagnosis present

## 2017-09-03 DIAGNOSIS — E669 Obesity, unspecified: Secondary | ICD-10-CM | POA: Diagnosis not present

## 2017-09-03 DIAGNOSIS — Z800 Family history of malignant neoplasm of digestive organs: Secondary | ICD-10-CM

## 2017-09-03 DIAGNOSIS — J189 Pneumonia, unspecified organism: Secondary | ICD-10-CM | POA: Diagnosis present

## 2017-09-03 DIAGNOSIS — J449 Chronic obstructive pulmonary disease, unspecified: Secondary | ICD-10-CM | POA: Diagnosis present

## 2017-09-03 DIAGNOSIS — J9600 Acute respiratory failure, unspecified whether with hypoxia or hypercapnia: Secondary | ICD-10-CM

## 2017-09-03 DIAGNOSIS — Z66 Do not resuscitate: Secondary | ICD-10-CM | POA: Diagnosis present

## 2017-09-03 DIAGNOSIS — E785 Hyperlipidemia, unspecified: Secondary | ICD-10-CM | POA: Diagnosis not present

## 2017-09-03 DIAGNOSIS — Z23 Encounter for immunization: Secondary | ICD-10-CM

## 2017-09-03 DIAGNOSIS — Z888 Allergy status to other drugs, medicaments and biological substances status: Secondary | ICD-10-CM

## 2017-09-03 DIAGNOSIS — I10 Essential (primary) hypertension: Secondary | ICD-10-CM | POA: Diagnosis present

## 2017-09-03 DIAGNOSIS — J9811 Atelectasis: Secondary | ICD-10-CM | POA: Diagnosis not present

## 2017-09-03 DIAGNOSIS — M79606 Pain in leg, unspecified: Secondary | ICD-10-CM

## 2017-09-03 DIAGNOSIS — Z6836 Body mass index (BMI) 36.0-36.9, adult: Secondary | ICD-10-CM

## 2017-09-03 DIAGNOSIS — J441 Chronic obstructive pulmonary disease with (acute) exacerbation: Secondary | ICD-10-CM | POA: Diagnosis not present

## 2017-09-03 DIAGNOSIS — B9789 Other viral agents as the cause of diseases classified elsewhere: Secondary | ICD-10-CM | POA: Diagnosis not present

## 2017-09-03 DIAGNOSIS — R918 Other nonspecific abnormal finding of lung field: Secondary | ICD-10-CM | POA: Diagnosis not present

## 2017-09-03 DIAGNOSIS — Z951 Presence of aortocoronary bypass graft: Secondary | ICD-10-CM

## 2017-09-03 DIAGNOSIS — Z86010 Personal history of colonic polyps: Secondary | ICD-10-CM

## 2017-09-03 DIAGNOSIS — Z87891 Personal history of nicotine dependence: Secondary | ICD-10-CM

## 2017-09-03 DIAGNOSIS — I2510 Atherosclerotic heart disease of native coronary artery without angina pectoris: Secondary | ICD-10-CM | POA: Diagnosis present

## 2017-09-03 DIAGNOSIS — E8770 Fluid overload, unspecified: Secondary | ICD-10-CM | POA: Diagnosis present

## 2017-09-03 DIAGNOSIS — I252 Old myocardial infarction: Secondary | ICD-10-CM | POA: Diagnosis not present

## 2017-09-03 DIAGNOSIS — E869 Volume depletion, unspecified: Secondary | ICD-10-CM | POA: Diagnosis present

## 2017-09-03 DIAGNOSIS — I4891 Unspecified atrial fibrillation: Secondary | ICD-10-CM | POA: Diagnosis not present

## 2017-09-03 DIAGNOSIS — F0390 Unspecified dementia without behavioral disturbance: Secondary | ICD-10-CM | POA: Diagnosis present

## 2017-09-03 DIAGNOSIS — G309 Alzheimer's disease, unspecified: Secondary | ICD-10-CM | POA: Diagnosis not present

## 2017-09-03 DIAGNOSIS — Z823 Family history of stroke: Secondary | ICD-10-CM

## 2017-09-03 DIAGNOSIS — Z87442 Personal history of urinary calculi: Secondary | ICD-10-CM | POA: Diagnosis not present

## 2017-09-03 DIAGNOSIS — Z803 Family history of malignant neoplasm of breast: Secondary | ICD-10-CM

## 2017-09-03 DIAGNOSIS — T380X5A Adverse effect of glucocorticoids and synthetic analogues, initial encounter: Secondary | ICD-10-CM | POA: Diagnosis present

## 2017-09-03 DIAGNOSIS — R0602 Shortness of breath: Secondary | ICD-10-CM | POA: Diagnosis not present

## 2017-09-03 DIAGNOSIS — K219 Gastro-esophageal reflux disease without esophagitis: Secondary | ICD-10-CM | POA: Diagnosis present

## 2017-09-03 DIAGNOSIS — R739 Hyperglycemia, unspecified: Secondary | ICD-10-CM | POA: Diagnosis present

## 2017-09-03 DIAGNOSIS — Z79899 Other long term (current) drug therapy: Secondary | ICD-10-CM

## 2017-09-03 DIAGNOSIS — R600 Localized edema: Secondary | ICD-10-CM | POA: Diagnosis not present

## 2017-09-03 HISTORY — DX: Alzheimer's disease, unspecified: G30.9

## 2017-09-03 HISTORY — DX: Dementia in other diseases classified elsewhere, unspecified severity, without behavioral disturbance, psychotic disturbance, mood disturbance, and anxiety: F02.80

## 2017-09-03 LAB — COMPREHENSIVE METABOLIC PANEL
ALBUMIN: 3.9 g/dL (ref 3.5–5.0)
ALT: 24 U/L (ref 17–63)
ANION GAP: 10 (ref 5–15)
AST: 29 U/L (ref 15–41)
Alkaline Phosphatase: 63 U/L (ref 38–126)
BUN: 25 mg/dL — ABNORMAL HIGH (ref 6–20)
CHLORIDE: 104 mmol/L (ref 101–111)
CO2: 25 mmol/L (ref 22–32)
CREATININE: 1.19 mg/dL (ref 0.61–1.24)
Calcium: 9.3 mg/dL (ref 8.9–10.3)
GFR calc non Af Amer: 56 mL/min — ABNORMAL LOW (ref >60)
GLUCOSE: 115 mg/dL — AB (ref 65–99)
Potassium: 4.4 mmol/L (ref 3.5–5.1)
SODIUM: 139 mmol/L (ref 135–145)
Total Bilirubin: 1 mg/dL (ref 0.3–1.2)
Total Protein: 7.4 g/dL (ref 6.5–8.1)

## 2017-09-03 LAB — CBC WITH DIFFERENTIAL/PLATELET
Basophils Absolute: 0 10*3/uL (ref 0.0–0.1)
Basophils Relative: 0 %
Eosinophils Absolute: 0.1 10*3/uL (ref 0.0–0.7)
Eosinophils Relative: 1 %
HCT: 45 % (ref 39.0–52.0)
HEMOGLOBIN: 14.8 g/dL (ref 13.0–17.0)
LYMPHS ABS: 1.1 10*3/uL (ref 0.7–4.0)
LYMPHS PCT: 8 %
MCH: 28.5 pg (ref 26.0–34.0)
MCHC: 32.9 g/dL (ref 30.0–36.0)
MCV: 86.7 fL (ref 78.0–100.0)
Monocytes Absolute: 1 10*3/uL (ref 0.1–1.0)
Monocytes Relative: 8 %
NEUTROS PCT: 83 %
Neutro Abs: 11.2 10*3/uL — ABNORMAL HIGH (ref 1.7–7.7)
Platelets: 180 10*3/uL (ref 150–400)
RBC: 5.19 MIL/uL (ref 4.22–5.81)
RDW: 13.5 % (ref 11.5–15.5)
WBC: 13.4 10*3/uL — AB (ref 4.0–10.5)

## 2017-09-03 LAB — PHOSPHORUS: PHOSPHORUS: 2.8 mg/dL (ref 2.5–4.6)

## 2017-09-03 LAB — TROPONIN I: Troponin I: <0.03 ng/mL (ref <0.03)

## 2017-09-03 LAB — URINALYSIS, ROUTINE W REFLEX MICROSCOPIC
BILIRUBIN URINE: NEGATIVE
Glucose, UA: NEGATIVE mg/dL
HGB URINE DIPSTICK: NEGATIVE
Ketones, ur: NEGATIVE mg/dL
Leukocytes, UA: NEGATIVE
NITRITE: NEGATIVE
PH: 5 (ref 5.0–8.0)
Protein, ur: NEGATIVE mg/dL
SPECIFIC GRAVITY, URINE: 1.004 — AB (ref 1.005–1.030)

## 2017-09-03 LAB — I-STAT CG4 LACTIC ACID, ED: Lactic Acid, Venous: 3.31 mmol/L (ref 0.5–1.9)

## 2017-09-03 LAB — PROTIME-INR
INR: 1.04
Prothrombin Time: 13.5 seconds (ref 11.4–15.2)

## 2017-09-03 LAB — MAGNESIUM: MAGNESIUM: 1.8 mg/dL (ref 1.7–2.4)

## 2017-09-03 MED ORDER — LOPERAMIDE HCL 2 MG PO CAPS
2.0000 mg | ORAL_CAPSULE | Freq: Every day | ORAL | Status: DC
  Administered 2017-09-04 – 2017-09-07 (×4): 2 mg via ORAL
  Filled 2017-09-03 (×4): qty 1

## 2017-09-03 MED ORDER — TAMSULOSIN HCL 0.4 MG PO CAPS
0.4000 mg | ORAL_CAPSULE | Freq: Every day | ORAL | Status: DC
  Administered 2017-09-03 – 2017-09-09 (×7): 0.4 mg via ORAL
  Filled 2017-09-03 (×7): qty 1

## 2017-09-03 MED ORDER — DEXTROSE 5 % IV SOLN: 1.0000 g | INTRAVENOUS | Status: DC

## 2017-09-03 MED ORDER — ENOXAPARIN SODIUM 40 MG/0.4ML ~~LOC~~ SOLN
40.0000 mg | SUBCUTANEOUS | Status: DC
  Administered 2017-09-03 – 2017-09-08 (×6): 40 mg via SUBCUTANEOUS
  Filled 2017-09-03 (×7): qty 0.4

## 2017-09-03 MED ORDER — TRIAMCINOLONE ACETONIDE 0.1 % EX CREA
TOPICAL_CREAM | CUTANEOUS | Status: AC
  Filled 2017-09-03: qty 15

## 2017-09-03 MED ORDER — ONDANSETRON HCL 4 MG PO TABS: 4.0000 mg | ORAL_TABLET | Freq: Four times a day (QID) | ORAL | Status: DC | PRN

## 2017-09-03 MED ORDER — DEXTROSE 5 % IV SOLN
500.0000 mg | INTRAVENOUS | Status: DC
  Administered 2017-09-04 – 2017-09-05 (×2): 500 mg via INTRAVENOUS
  Filled 2017-09-03 (×4): qty 500

## 2017-09-03 MED ORDER — CLOBETASOL PROPIONATE 0.05 % EX CREA
1.0000 "application " | TOPICAL_CREAM | Freq: Two times a day (BID) | CUTANEOUS | Status: DC | PRN
  Filled 2017-09-03: qty 15

## 2017-09-03 MED ORDER — SODIUM CHLORIDE 0.9 % IV SOLN
INTRAVENOUS | Status: AC
  Administered 2017-09-04: 04:00:00 via INTRAVENOUS

## 2017-09-03 MED ORDER — DEXTROSE 5 % IV SOLN
500.0000 mg | Freq: Once | INTRAVENOUS | Status: AC
  Administered 2017-09-03: 500 mg via INTRAVENOUS
  Filled 2017-09-03: qty 500

## 2017-09-03 MED ORDER — IPRATROPIUM-ALBUTEROL 0.5-2.5 (3) MG/3ML IN SOLN
RESPIRATORY_TRACT | Status: AC
  Administered 2017-09-03: 3 mL via RESPIRATORY_TRACT
  Filled 2017-09-03: qty 3

## 2017-09-03 MED ORDER — IPRATROPIUM BROMIDE 0.02 % IN SOLN: 0.5000 mg | Freq: Four times a day (QID) | RESPIRATORY_TRACT | Status: DC | PRN

## 2017-09-03 MED ORDER — ONDANSETRON HCL 4 MG/2ML IJ SOLN: 4.0000 mg | Freq: Four times a day (QID) | INTRAMUSCULAR | Status: DC | PRN

## 2017-09-03 MED ORDER — TRIAMCINOLONE ACETONIDE 0.1 % EX CREA
1.0000 "application " | TOPICAL_CREAM | Freq: Two times a day (BID) | CUTANEOUS | Status: DC
  Administered 2017-09-03: 1 via TOPICAL
  Filled 2017-09-03: qty 15

## 2017-09-03 MED ORDER — TRAZODONE HCL 50 MG PO TABS
50.0000 mg | ORAL_TABLET | Freq: Every day | ORAL | Status: DC
  Administered 2017-09-03 – 2017-09-08 (×6): 50 mg via ORAL
  Filled 2017-09-03 (×6): qty 1

## 2017-09-03 MED ORDER — INFLUENZA VAC SPLIT HIGH-DOSE 0.5 ML IM SUSY
0.5000 mL | PREFILLED_SYRINGE | INTRAMUSCULAR | Status: AC
  Administered 2017-09-04: 0.5 mL via INTRAMUSCULAR
  Filled 2017-09-03: qty 0.5

## 2017-09-03 MED ORDER — ACETAMINOPHEN 325 MG PO TABS
650.0000 mg | ORAL_TABLET | Freq: Four times a day (QID) | ORAL | Status: DC | PRN
  Administered 2017-09-04: 650 mg via ORAL
  Filled 2017-09-03: qty 2

## 2017-09-03 MED ORDER — LOPERAMIDE HCL 2 MG PO TABS: 2.0000 mg | ORAL_TABLET | Freq: Every day | ORAL | Status: DC

## 2017-09-03 MED ORDER — MAGNESIUM SULFATE 2 GM/50ML IV SOLN
2.0000 g | Freq: Once | INTRAVENOUS | Status: AC
  Administered 2017-09-03: 2 g via INTRAVENOUS
  Filled 2017-09-03: qty 50

## 2017-09-03 MED ORDER — ACETAMINOPHEN 650 MG RE SUPP
650.0000 mg | Freq: Four times a day (QID) | RECTAL | Status: DC | PRN
Start: 2017-09-03 — End: 2017-09-09

## 2017-09-03 MED ORDER — IPRATROPIUM-ALBUTEROL 0.5-2.5 (3) MG/3ML IN SOLN
3.0000 mL | Freq: Four times a day (QID) | RESPIRATORY_TRACT | Status: DC
  Administered 2017-09-03 – 2017-09-05 (×9): 3 mL via RESPIRATORY_TRACT
  Filled 2017-09-03 (×8): qty 3

## 2017-09-03 MED ORDER — ACETAMINOPHEN 500 MG PO TABS
1000.0000 mg | ORAL_TABLET | Freq: Once | ORAL | Status: AC
Start: 2017-09-03 — End: 2017-09-03
  Administered 2017-09-03: 1000 mg via ORAL
  Filled 2017-09-03: qty 2

## 2017-09-03 MED ORDER — SODIUM CHLORIDE 0.9 % IV BOLUS (SEPSIS)
1000.0000 mL | Freq: Once | INTRAVENOUS | Status: AC
  Administered 2017-09-03: 1000 mL via INTRAVENOUS

## 2017-09-03 MED ORDER — DEXTROSE 5 % IV SOLN
1.0000 g | INTRAVENOUS | Status: DC
  Administered 2017-09-04: 1 g via INTRAVENOUS
  Filled 2017-09-03 (×4): qty 10

## 2017-09-03 MED ORDER — DEXTROSE 5 % IV SOLN
1.0000 g | Freq: Once | INTRAVENOUS | Status: AC
  Administered 2017-09-03: 1 g via INTRAVENOUS
  Filled 2017-09-03: qty 10

## 2017-09-03 MED ORDER — DEXTROSE 5 % IV SOLN: 500.0000 mg | INTRAVENOUS | Status: DC

## 2017-09-03 MED ORDER — FAMOTIDINE 20 MG PO TABS
20.0000 mg | ORAL_TABLET | Freq: Two times a day (BID) | ORAL | Status: DC
  Administered 2017-09-03 – 2017-09-09 (×12): 20 mg via ORAL
  Filled 2017-09-03 (×12): qty 1

## 2017-09-03 MED ORDER — METHYLPREDNISOLONE SODIUM SUCC 125 MG IJ SOLR
125.0000 mg | Freq: Once | INTRAMUSCULAR | Status: AC
  Administered 2017-09-03: 125 mg via INTRAVENOUS
  Filled 2017-09-03: qty 2

## 2017-09-03 MED ORDER — ALBUTEROL SULFATE (2.5 MG/3ML) 0.083% IN NEBU: 2.5000 mg | INHALATION_SOLUTION | Freq: Four times a day (QID) | RESPIRATORY_TRACT | Status: DC | PRN

## 2017-09-03 NOTE — Progress Notes (Signed)
Pharmacy Antibiotic Note  Tyler Mejia is a 81 y.o. male admitted on 09/03/2017 with pneumonia.  Pharmacy has been consulted for Rocephin and zithromax dosing.  Plan: Rocephin 1gm IV q24h Zithromax 500mg  IV q24h Deescalate as indicated to po tx F/U cx and clinical progress  Height: 5\' 9"  (175.3 cm) Weight: 232 lb (105.2 kg) IBW/kg (Calculated) : 70.7  Temp (24hrs), Avg:102 F (38.9 C), Min:102 F (38.9 C), Max:102 F (38.9 C)   Recent Labs Lab 09/03/17 1720 09/03/17 1729  WBC 13.4*  --   CREATININE 1.19  --   LATICACIDVEN  --  3.31*    Estimated Creatinine Clearance: 60.2 mL/min (by C-G formula based on SCr of 1.19 mg/dL).    Allergies  Allergen Reactions  . Codeine Other (See Comments)    Pt does not know   . Statins Other (See Comments) and Nausea And Vomiting    Antimicrobials this admission: Ceftriaxone 9/26 >>  Azithromycin 9/26 >>   Microbiology results: 9/26 BCx: pending  Thank you for allowing pharmacy to be a part of this patient's care.  Isac Sarna, BS [place], [place] Clinical Pharmacist Pager [phone] 09/03/2017 8:26 PM

## 2017-09-03 NOTE — H&P (Signed)
History and Physical    Tyler Mejia ATF:[id number] DOB: Nov 21, 1936 DOA: 09/03/2017  PCP: [name], MD   Patient coming from: Alzheimers unit of [facility] [place] have personally briefly reviewed patient's old medical records in [place]  Chief Complaint: Cough for a week and fever since this morning.  HPI: Tyler Mejia is a 81 y.o. male with medical history significant of Alzheimer's dementia, CAD/CABG, non-STEMI, transient atrial fibrillation (postop from CABG in September 2016), grade 1 diastolic dysfunction, collagenous vascular disease, COPD, essential hypertension, GERD, history of CVA in January 2017, hyperlipidemia with statin intolerance, IBS, nephrolithiasis, obesity who was brought from his assisted living facility due to having a cough for about a week and fever since early this morning. The patient's son stated earlier to Dr. Dolly Rias that he has been having a cough for about 7-10 days, but since yesterday he has felt a lot weaker, developed a fever and night sweats. This morning, when he was being evaluated at the assisted living facility he was found to be febrile and eventually sent here for further evaluation. The patient currently denies headache, chest pain, sore throat, palpitations, dizziness, abdominal pain, diarrhea, constipation, melena, hematochezia dysuria or frequency.  ED Course: Initial vital signs in emergency department temperature 102F, pulse ox 106, blood pressure 156/105 mmHg, respirations 27 and O2 sat 93% on room air. The patient received supplemental oxygen, acetaminophen 1000 mg orally, 2000 mL normal saline bolus, azithromycin 500 mg and ceftriaxone 1 g IVPB. I ordered a Duoneb treatment, solumedrol 125 mg IVP and magnesium sulfate 2 g IVPB.  His workup showed a normal urinalysis. WBC was 13.4 with 83% neutrophils, hemoglobin 14.8 g/dL and platelets 180. PT and INR were normal. Lactic acid was 3.31. His CMP showed a glucose of 115, BUN of 25  mg/dL with a GFR of 56 mL/min, but all other values were normal. Magnesium was 1.8 and phosphorus 2.8 mg/dL. His EKG showed new T-wave abnormalities in lateral leads, but he has a negative troponin level no complaints of chest pain. His chest radiograph showed mild cardiomegaly and bibasilar atelectasis/scarring. The patient's body habitus limited study.  Review of Systems: As per HPI otherwise 10 point review of systems negative.    Past Medical History:  Diagnosis Date  . Alzheimer's dementia   . Atrial fibrillation, transient ([place])    a. Post-op from CABG 08/2015.  Marland Kitchen CAD (coronary artery disease)    a. DES to the RCA March 2013 with residual dz rx medically. b. s/p CABGx4 in 08/2015.  . Collagen vascular disease ([place])   . COPD (chronic obstructive pulmonary disease) ([place])   . Essential hypertension   . GERD (gastroesophageal reflux disease)   . History of stroke    January 2017  . HLD (hyperlipidemia)    a. H/o multiple statin intolerances.  Marland Kitchen Hx of adenomatous colonic polyps   . IBS (irritable bowel syndrome)   . Nephrolithiasis   . NSTEMI (non-ST elevated myocardial infarction) [facility]) March 2013  . Obesity   . Stroke [facility])     Past Surgical History:  Procedure Laterality Date  . APPENDECTOMY    . BACK SURGERY    . CARDIAC CATHETERIZATION N/A 08/09/2015   Procedure: Left Heart Cath and Coronary Angiography;  Surgeon: Jettie Booze, MD;  Location: [place] CV [facility];  Service: Cardiovascular;  Laterality: N/A;  . COLONOSCOPY  08/21/2004   Pedunculated polyp at the splenic flexure  . COLONOSCOPY  09/2009   Dr.  Rourk--> Cecal and hepatic flexure polyps, tubular adenomas. Next colonoscopy October 2013  . COLONOSCOPY N/A 05/07/2013   RMR: colonic polyp/colonic diverticulosis  . CORONARY ARTERY BYPASS GRAFT N/A 08/11/2015   Procedure: CORONARY ARTERY BYPASS GRAFTING (CABG) X4 UTILIZING THE LEFT INTERNAL MAMMARY ARTERY TO LAD AND ENDOSCOPICALLY HARVESTED BILATERAL SAPHENEOUS  VEINS TO DIAGONAL,OM1 AND OM2;  Surgeon: Ivin Poot, MD;  Location: [place];  Service: Open Heart Surgery;  Laterality: N/A;  . ESOPHAGOGASTRODUODENOSCOPY  2003   distal erosion/small hiatal hernia  . ESOPHAGOGASTRODUODENOSCOPY  09/2009   Dr. Evalee Mutton ring, mild distal esophageal erosions, status post Maloney dilation, small hiatal hernia.  Marland Kitchen ESOPHAGOGASTRODUODENOSCOPY N/A 03/16/2015   OIZ:TIWPY HH/focal area of distal esophageal s/p bx  . LEFT HEART CATHETERIZATION WITH CORONARY ANGIOGRAM N/A 03/02/2012   Procedure: LEFT HEART CATHETERIZATION WITH CORONARY ANGIOGRAM;  Surgeon: Peter M Martinique, MD;  Location: [facility] CATH LAB;  Service: Cardiovascular;  Laterality: N/A;  . PERCUTANEOUS CORONARY STENT INTERVENTION (PCI-S) N/A 03/02/2012   Procedure: PERCUTANEOUS CORONARY STENT INTERVENTION (PCI-S);  Surgeon: Peter M Martinique, MD;  Location: [facility] CATH LAB;  Service: Cardiovascular;  Laterality: N/A;  . TEE WITHOUT CARDIOVERSION N/A 08/11/2015   Procedure: TRANSESOPHAGEAL ECHOCARDIOGRAM (TEE);  Surgeon: Ivin Poot, MD;  Location: [place];  Service: Open Heart Surgery;  Laterality: N/A;     reports that he quit smoking about 15 years ago. His smoking use included Cigarettes. He has a 67.50 pack-year smoking history. He has never used smokeless tobacco. He reports that he does not drink alcohol or use drugs.  Allergies  Allergen Reactions  . Codeine Other (See Comments)    Pt does not know   . Statins Other (See Comments) and Nausea And Vomiting    Family History  Problem Relation Age of Onset  . Pneumonia Father   . Heart disease Mother   . Diabetes Mother   . Stroke Mother   . Stroke Sister   . Breast cancer Sister        2 sisters  . Esophageal cancer Brother   . Colon cancer Neg Hx     Prior to Admission medications   Medication Sig Start Date End Date Taking? Authorizing Provider  acetaminophen (TYLENOL) 325 MG tablet Take 2 tablets (650 mg total) by mouth every 6 (six) hours as  needed for mild pain, fever or headache. Patient taking differently: Take 325 mg by mouth every 6 (six) hours as needed for mild pain or fever.  08/24/15  Yes Barrett, Erin R, PA-C  clobetasol cream (TEMOVATE) 0.99 % Apply 1 application topically 2 (two) times daily as needed (irritated scalp).   Yes [provider]  compounded topicals builder Apply 1 application topically 2 (two) times daily as needed (for rash). R-FLUTICASONE CR/KETOCONAZOLE CR--to underarms/groin area(s)   Yes [provider]  hydrocortisone cream 1 % Apply 1 application topically 2 (two) times daily as needed for itching.   Yes [provider]  loperamide (IMODIUM A-D) 2 MG tablet Take 2 mg by mouth daily.   Yes [provider]  mometasone (ELOCON) 0.1 % cream Apply 1 application topically 2 (two) times daily as needed (skin irritation).   Yes [provider]  tamsulosin (FLOMAX) 0.4 MG CAPS capsule Take 0.4 mg by mouth daily. 04/12/16  Yes [provider]  traZODone (DESYREL) 50 MG tablet Take 50 mg by mouth at bedtime.    Yes [provider]  triamcinolone cream (KENALOG) 0.1 % Apply 1 application topically 2 (two)  times daily.   Yes [provider]    Physical Exam: Vitals:   09/03/17 1937 09/03/17 2000 09/03/17 2030 09/03/17 2040  BP:  (!) 126/59 125/69   Pulse:  97 93   Resp:  20 20   Temp:      TempSrc:      SpO2: 95% 90% 90% 96%  Weight:      Height:        Constitutional: NAD, calm, comfortable Eyes: PERRL, lids and conjunctivae normal ENMT: Mucous membranes are moist. Posterior pharynx clear of any exudate or lesions. Neck: normal, supple, no masses, no thyromegaly Respiratory: Decreased breath sounds at bases with mild bibasilar rales, bilateral wheezing and rhonchi. No accessory muscle use.  Cardiovascular: Regular rate and rhythm, no murmurs / rubs / gallops. 2+ lower extremities edema. 2+ pedal pulses. No carotid bruits.  Abdomen: no  tenderness, no masses palpated. No hepatosplenomegaly. Bowel sounds positive.  Musculoskeletal: no clubbing / cyanosis. Good ROM, no contractures. Normal muscle tone.  Skin: Ecchymosis on right hand area seen on limited skin exam. Neurologic: CN 2-12 grossly intact. Sensation intact, DTR normal. Strength 5/5 in all 4.  Psychiatric: Normal judgment and insight. Alert and oriented x 2, partially Oriented to time and situation. Normal mood.    Labs on Admission: I have personally reviewed following labs and imaging studies  CBC:  Recent Labs Lab 09/03/17 1720  WBC 13.4*  NEUTROABS 11.2*  HGB 14.8  HCT 45.0  MCV 86.7  PLT 371   Basic Metabolic Panel:  Recent Labs Lab 09/03/17 1720  NA 139  K 4.4  CL 104  CO2 25  GLUCOSE 115*  BUN 25*  CREATININE 1.19  CALCIUM 9.3   GFR: Estimated Creatinine Clearance: 60.2 mL/min (by C-G formula based on SCr of 1.19 mg/dL). Liver Function Tests:  Recent Labs Lab 09/03/17 1720  AST 29  ALT 24  ALKPHOS 63  BILITOT 1.0  PROT 7.4  ALBUMIN 3.9   No results for input(s): LIPASE, AMYLASE in the last 168 hours. No results for input(s): AMMONIA in the last 168 hours. Coagulation Profile:  Recent Labs Lab 09/03/17 1720  INR 1.04   Cardiac Enzymes:  Recent Labs Lab 09/03/17 1722  TROPONINI <0.03   BNP (last 3 results) No results for input(s): PROBNP in the last 8760 hours. HbA1C: No results for input(s): HGBA1C in the last 72 hours. CBG: No results for input(s): GLUCAP in the last 168 hours. Lipid Profile: No results for input(s): CHOL, HDL, LDLCALC, TRIG, CHOLHDL, LDLDIRECT in the last 72 hours. Thyroid Function Tests: No results for input(s): TSH, T4TOTAL, FREET4, T3FREE, THYROIDAB in the last 72 hours. Anemia Panel: No results for input(s): VITAMINB12, FOLATE, FERRITIN, TIBC, IRON, RETICCTPCT in the last 72 hours. Urine analysis:    Component Value Date/Time   COLORURINE STRAW (A) 09/03/2017 1714   APPEARANCEUR  CLEAR 09/03/2017 1714   LABSPEC 1.004 (L) 09/03/2017 1714   PHURINE 5.0 09/03/2017 1714   GLUCOSEU NEGATIVE 09/03/2017 1714   HGBUR NEGATIVE 09/03/2017 1714   BILIRUBINUR NEGATIVE 09/03/2017 1714   KETONESUR NEGATIVE 09/03/2017 1714   PROTEINUR NEGATIVE 09/03/2017 1714   UROBILINOGEN 0.2 08/23/2015 0443   NITRITE NEGATIVE 09/03/2017 1714   LEUKOCYTESUR NEGATIVE 09/03/2017 1714    Radiological Exams on Admission: Dg Chest 2 View  Result Date: 09/03/2017 CLINICAL DATA:  Cough for 1 week, shortness of breath. Generalized weakness. History of COPD. EXAM: CHEST  2 VIEW COMPARISON:  Chest radiograph December 21, 2015 FINDINGS: Habitus  limited examination. The cardiac silhouette is at least mildly enlarged. Status post median sternotomy for CABG. No pleural effusion. Bibasilar strandy densities. No pneumothorax. Soft tissue planes and included osseous structures are nonsuspicious. Fractured superior sternal wires. IMPRESSION: Mild cardiomegaly. Bibasilar atelectasis/ scarring on this habitus limited examination. Electronically Signed   By: Elon Alas M.D.   On: 09/03/2017 17:50   Echocardiogram 08/10/2015  ------------------------------------------------------------------- LV EF: 50% -   55%  ------------------------------------------------------------------- Indications:      CAD of native vessels 414.01.  ------------------------------------------------------------------- History:   PMH:  Former Smoker.  Angina pectoris.  Coronary artery disease.  Coronary artery disease.  Chronic obstructive pulmonary disease.  Risk factors:  Hypertension. Dyslipidemia.  ------------------------------------------------------------------- Study Conclusions  - Left ventricle: The cavity size was normal. There was moderate   concentric hypertrophy. Systolic function was normal. The   estimated ejection fraction was in the range of 50% to 55%. There   is hypokinesis of the basalinferior  myocardium. Doppler   parameters are consistent with abnormal left ventricular   relaxation (grade 1 diastolic dysfunction).  Impressions:  - EF is improved when compared to prior echocardiogram.   EKG: Independently reviewed. Vent. rate 90 BPM PR interval * ms QRS duration 97 ms QT/QTc 362/443 ms P-R-T axes -49 57 209 Sinus or ectopic atrial rhythm Abnormal lateral Q waves Repol abnrm suggests ischemia, diffuse leads Baseline wander in lead(s) II III aVF V2 Lateral t wave abnormalities are new when compared to March 30th/2018.  Assessment/Plan Principal Problem:   Sepsis due to pneumonia ([place])   CAP (community acquired pneumonia) Admit to telemetry as/inpatient. Continue supplemental oxygen. Continue bronchodilators. Continue ceftriaxone 1 g IV PB every 24 hours. Continue azithromycin 500 mg IV PB every 24 hours. Check sputum Gram stain, culture and sensitivity. Follow-up blood cultures and sensitivity. Check strep pneumoniae urinary antigen.  Active Problems:   COPD (chronic obstructive pulmonary disease) (H[facility]) Continue supplemental oxygen. Continue as needed and schedule bronchodilators. Magnesium sulfate 2 g IVPB 1 dose. Solu-Medrol 125 mg IVP 1 dose    Essential hypertension Not currently on antihypertensive medications. Monitor blood pressure.    HLD (hyperlipidemia) Not currently on medical therapy. Continue low cholesterol diet.    Coronary artery disease involving native coronary artery Not currently on medications. Will start low-dose aspirin with famotidine GI prophylaxis. Consider starting low-dose beta blocker. Check EKG in a.m.     Abnormal ECG Unknown as to when EKG changes occur. As above. Check EKG and echocardiogram in the morning.    Dementia Supportive care.      DVT prophylaxis: Lovenox SQ. Code Status: DNR/DNI. Family Communication: None at bedside. Disposition Plan: Admit for Iv antibiotics for several days. Consults  called:  Admission status: Inpatient/Telemetry.   Reubin Milan MD [facility] Pager [phone].  If 7PM-7AM, please contact night-coverage [email or web address] Password [facility]  09/03/2017, 8:49 PM   This document was dictated using [place] and may contain some unintended errors.

## 2017-09-03 NOTE — ED Notes (Signed)
Charting error when writer acknowledged "Call Code Sepsis" at [place].

## 2017-09-03 NOTE — ED Triage Notes (Signed)
Pt from alzheimers unit of [facility] [place]. Cough x 1 week. Fever since this am. Was given tylenol at 1330 today. Alert and oriented to most. Sob/cough noted. Denies pain. gen weakness noted. 02 2l [place] applied.

## 2017-09-03 NOTE — ED Notes (Signed)
2 sets of blood cultures drawn.  

## 2017-09-03 NOTE — ED Provider Notes (Signed)
Emergency Department Provider Note   I have reviewed the triage vital signs and the nursing notes.   HISTORY  Chief Complaint Weakness and Shortness of Breath   HPI Tyler Mejia is a 81 y.o. male with a history of Alzheimer's, atrial fibrillation, coronary artery disease, COPD, collagen vascular disease, stroke, hyperlipidemia who presents to the emergency department today with an acute worsening of cough. The patient and son state that he's been having a viral type cough for 7-10 days but last night the cough and weakness got much more significant. This was also associated with high fevers, diaphoresis and just overall weakness and not feeling well. He resides in assisted living facility where he was found to be febrile and short of breath today so sent here for further evaluation. Patient does not really have any other complaints. Does not have any prescription medications for this recently. No other associated symptoms. No other modifying symptoms. No urinary symptoms, abdominal symptoms, back symptoms or rashes.   Past Medical History:  Diagnosis Date  . Alzheimer's dementia   . Atrial fibrillation, transient ([place])    a. Post-op from CABG 08/2015.  Marland Kitchen CAD (coronary artery disease)    a. DES to the RCA March 2013 with residual dz rx medically. b. s/p CABGx4 in 08/2015.  . Collagen vascular disease ([place])   . COPD (chronic obstructive pulmonary disease) ([place])   . Essential hypertension   . GERD (gastroesophageal reflux disease)   . History of stroke    January 2017  . HLD (hyperlipidemia)    a. H/o multiple statin intolerances.  Marland Kitchen Hx of adenomatous colonic polyps   . IBS (irritable bowel syndrome)   . Nephrolithiasis   . NSTEMI (non-ST elevated myocardial infarction) [facility]) March 2013  . Obesity   . Stroke [facility])     Patient Active Problem List   Diagnosis Date Noted  . Dementia 05/21/2016  . Coronary artery disease involving coronary bypass graft of native heart with  unspecified angina pectoris 02/29/2016  . Cerebrovascular accident (CVA) due to bilateral thrombosis of middle cerebral arteries ([place]) 02/29/2016  . CVA (cerebral infarction) 12/21/2015  . Altered mental status 12/21/2015  . Ileus, postoperative ([place]) 08/23/2015  . S/P CABG x 4 08/11/2015  . Obesity 08/10/2015  . Accelerating angina ([place]) 08/09/2015  . Mucosal abnormality of esophagus   . Hiatal hernia   . Dysphagia 03/15/2015  . Schatzki's ring 03/15/2015  . COPD (chronic obstructive pulmonary disease) ([place]) 08/21/2012  . Coronary artery disease involving native coronary artery 05/20/2012  . HLD (hyperlipidemia) 04/10/2012  . Shortness of breath 02/28/2012  . Essential hypertension 02/28/2012    Past Surgical History:  Procedure Laterality Date  . APPENDECTOMY    . BACK SURGERY    . CARDIAC CATHETERIZATION N/A 08/09/2015   Procedure: Left Heart Cath and Coronary Angiography;  Surgeon: Jettie Booze, MD;  Location: [place] CV [facility];  Service: Cardiovascular;  Laterality: N/A;  . COLONOSCOPY  08/21/2004   Pedunculated polyp at the splenic flexure  . COLONOSCOPY  09/2009   Dr. Rourk--> Cecal and hepatic flexure polyps, tubular adenomas. Next colonoscopy October 2013  . COLONOSCOPY N/A 05/07/2013   RMR: colonic polyp/colonic diverticulosis  . CORONARY ARTERY BYPASS GRAFT N/A 08/11/2015   Procedure: CORONARY ARTERY BYPASS GRAFTING (CABG) X4 UTILIZING THE LEFT INTERNAL MAMMARY ARTERY TO LAD AND ENDOSCOPICALLY HARVESTED BILATERAL SAPHENEOUS VEINS TO DIAGONAL,OM1 AND OM2;  Surgeon: Ivin Poot, MD;  Location: [place];  Service: Open Heart Surgery;  Laterality: N/A;  . ESOPHAGOGASTRODUODENOSCOPY  2003   distal erosion/small hiatal hernia  . ESOPHAGOGASTRODUODENOSCOPY  09/2009   Dr. Evalee Mutton ring, mild distal esophageal erosions, status post Maloney dilation, small hiatal hernia.  Marland Kitchen ESOPHAGOGASTRODUODENOSCOPY N/A 03/16/2015   XIP:JASNK HH/focal area of distal esophageal s/p  bx  . LEFT HEART CATHETERIZATION WITH CORONARY ANGIOGRAM N/A 03/02/2012   Procedure: LEFT HEART CATHETERIZATION WITH CORONARY ANGIOGRAM;  Surgeon: Peter M Martinique, MD;  Location: [facility] CATH LAB;  Service: Cardiovascular;  Laterality: N/A;  . PERCUTANEOUS CORONARY STENT INTERVENTION (PCI-S) N/A 03/02/2012   Procedure: PERCUTANEOUS CORONARY STENT INTERVENTION (PCI-S);  Surgeon: Peter M Martinique, MD;  Location: [facility] CATH LAB;  Service: Cardiovascular;  Laterality: N/A;  . TEE WITHOUT CARDIOVERSION N/A 08/11/2015   Procedure: TRANSESOPHAGEAL ECHOCARDIOGRAM (TEE);  Surgeon: Ivin Poot, MD;  Location: [place];  Service: Open Heart Surgery;  Laterality: N/A;    Current Outpatient Rx  . Order #: [id number] Class: No Print  . Order #: [id number] Class: Historical Med  . Order #: [id number] Class: Historical Med  . Order #: [id number] Class: Historical Med  . Order #: [id number] Class: Historical Med  . Order #: [id number] Class: No Print  . Order #: [id number] Class: Historical Med  . Order #: [id number] Class: Historical Med  . Order #: [id number] Class: Historical Med  . Order #: [id number] Class: Historical Med    Allergies Codeine and Statins  Family History  Problem Relation Age of Onset  . Pneumonia Father   . Heart disease Mother   . Diabetes Mother   . Stroke Mother   . Stroke Sister   . Breast cancer Sister        2 sisters  . Esophageal cancer Brother   . Colon cancer Neg Hx     Social History Social History  Substance Use Topics  . Smoking status: Former Smoker    Packs/day: 1.50    Years: 45.00    Types: Cigarettes    Quit date: 12/09/2001  . Smokeless tobacco: Never Used     Comment: quit about 9 years ago  . Alcohol use No     Comment: Very rarely    Review of Systems  All other systems negative except as documented in the HPI. All pertinent positives and negatives as reviewed in the HPI. ____________________________________________   PHYSICAL EXAM:  VITAL SIGNS: ED Triage Vitals  [09/03/17 1715]  Enc Vitals Group     BP (!) 156/105     Pulse Rate (!) 106     Resp (!) 27     Temp (!) 102 F (38.9 C)     Temp Source Rectal     SpO2 93 %     Weight      Height      Head Circumference      Peak Flow      Pain Score      Pain Loc      Pain Edu?      Excl. in [place]?     Constitutional: Alert baseline disoriented. Well appearing and in no acute distress. Eyes: Conjunctivae are normal. PERRL. EOMI. Head: Atraumatic. Nose: No congestion/rhinnorhea. Mouth/Throat: Mucous membranes are moist.  Oropharynx non-erythematous. Neck: No stridor.  No meningeal signs.   Cardiovascular: tachycardic rate, regular rhythm. Good peripheral circulation. Grossly normal heart sounds.   Respiratory: tachypneic but no distress.  No retractions. Lungs decreased on the left with crackles. Gastrointestinal: Soft and nontender. No distention.  Musculoskeletal: No lower extremity tenderness nor edema. No gross deformities of extremities. Neurologic:  Normal speech and language. No gross focal neurologic deficits are appreciated.  Skin:  Skin is warm, diaphoretic and intact. No rash noted.   ____________________________________________   LABS (all labs ordered are listed, but only abnormal results are displayed)  Labs Reviewed  CULTURE, BLOOD (ROUTINE X 2)  CULTURE, BLOOD (ROUTINE X 2)  COMPREHENSIVE METABOLIC PANEL  CBC WITH DIFFERENTIAL/PLATELET  PROTIME-INR  URINALYSIS, ROUTINE W REFLEX MICROSCOPIC  I-STAT CG4 LACTIC ACID, ED   ____________________________________________  EKG   EKG Interpretation  Date/Time:  Wednesday September 03 2017 18:42:33 EDT Ventricular Rate:  90 PR Interval:    QRS Duration: 97 QT Interval:  362 QTC Calculation: 443 R Axis:   57 Text Interpretation:  Sinus or ectopic atrial rhythm Abnormal lateral Q waves Repol abnrm suggests ischemia, diffuse leads Baseline wander in lead(s) II III aVF V2 TWI new q waves old Confirmed by Merrily Pew  [phone]) on 09/03/2017 7:40:04 PM       ____________________________________________  RADIOLOGY  No results found.  ____________________________________________   PROCEDURES  Procedure(s) performed:   Procedures   ____________________________________________   INITIAL IMPRESSION / ASSESSMENT AND PLAN / ED COURSE  Pertinent labs & imaging results that were available during my care of the patient were reviewed by me and considered in my medical decision making (see chart for details).  Suspect sepsis. Likely related to pneumonia given the cough and the recent viral infection concern for possible staph pneumonia. We'll treat with antibiotics and fluids while working up. Likely admission.  New ecg changes but neg troponin.   Workup c/w liekly sepsis from PNA, CXR not entirely clear yet, but with crackles, cough, hypoxia, tachypnea and fever I think it is likely PNA.    ____________________________________________  FINAL CLINICAL IMPRESSION(S) / ED DIAGNOSES  Final diagnoses:  None     MEDICATIONS GIVEN DURING THIS VISIT:  Medications  cefTRIAXone (ROCEPHIN) 1 g in dextrose 5 % 50 mL IVPB (not administered)  azithromycin (ZITHROMAX) 500 mg in dextrose 5 % 250 mL IVPB (not administered)     NEW OUTPATIENT MEDICATIONS STARTED DURING THIS VISIT:  New Prescriptions   No medications on file    Note:  This document was prepared using Dragon voice recognition software and may include unintentional dictation errors.   Merrily Pew, MD 09/03/17 2131

## 2017-09-03 NOTE — Progress Notes (Signed)
    Recent Labs Lab 09/03/17 1729  LATICACIDVEN 3.31*    No results for input(s): PROCALCITON in the last 168 hours.   Plan Repeat lactate  Dr. Brand Males, M.D., [facility]C.P Pulmonary and Critical Care Medicine Staff Physician [facility] Pager: [phone], If no answer or between  15:00h - 7:00h: call [phone]  09/03/2017 10:38 PM

## 2017-09-04 ENCOUNTER — Inpatient Hospital Stay (HOSPITAL_COMMUNITY): Payer: Medicare Other

## 2017-09-04 DIAGNOSIS — A419 Sepsis, unspecified organism: Secondary | ICD-10-CM

## 2017-09-04 DIAGNOSIS — I2510 Atherosclerotic heart disease of native coronary artery without angina pectoris: Secondary | ICD-10-CM

## 2017-09-04 DIAGNOSIS — I10 Essential (primary) hypertension: Secondary | ICD-10-CM

## 2017-09-04 DIAGNOSIS — R9431 Abnormal electrocardiogram [ECG] [EKG]: Secondary | ICD-10-CM

## 2017-09-04 DIAGNOSIS — J441 Chronic obstructive pulmonary disease with (acute) exacerbation: Secondary | ICD-10-CM

## 2017-09-04 DIAGNOSIS — F0390 Unspecified dementia without behavioral disturbance: Secondary | ICD-10-CM

## 2017-09-04 DIAGNOSIS — J9601 Acute respiratory failure with hypoxia: Secondary | ICD-10-CM

## 2017-09-04 LAB — ECHOCARDIOGRAM COMPLETE
AVLVOTPG: 6 mmHg
CHL CUP DOP CALC LVOT VTI: 27.1 cm
CHL CUP MV DEC (S): 218
CHL CUP STROKE VOLUME: 48 mL
CHL CUP TV REG PEAK VELOCITY: 258 cm/s
EERAT: 12.6
EWDT: 218 ms
FS: 34 % (ref 28–44)
Height: 69 in
IV/PV OW: 0.87
LA ID, A-P, ES: 39 mm
LA diam index: 1.63 cm/m2
LA vol index: 22.1 mL/m2
LA vol: 52.8 mL
LAVOLA4C: 56.1 mL
LDCA: 3.14 cm2
LEFT ATRIUM END SYS DIAM: 39 mm
LV E/e'average: 12.6
LV PW d: 9.92 mm — AB (ref 0.6–1.1)
LV TDI E'LATERAL: 8.81
LV TDI E'MEDIAL: 6.85
LV dias vol index: 31 mL/m2
LV e' LATERAL: 8.81 cm/s
LVDIAVOL: 74 mL (ref 62–150)
LVEEMED: 12.6
LVOT SV: 85 mL
LVOT peak vel: 124 cm/s
LVOTD: 20 mm
LVSYSVOL: 26 mL (ref 21–61)
LVSYSVOLIN: 11 mL/m2
MV pk E vel: 111 m/s
MVPG: 5 mmHg
MVPKAVEL: 85.7 m/s
RV LATERAL S' VELOCITY: 13.4 cm/s
RV sys press: 35 mmHg
Simpson's disk: 65
TAPSE: 18.9 mm
TRMAXVEL: 258 cm/s
Weight: 3977.6 oz

## 2017-09-04 LAB — CBC WITH DIFFERENTIAL/PLATELET
BASOS PCT: 0 %
Basophils Absolute: 0 10*3/uL (ref 0.0–0.1)
Eosinophils Absolute: 0 10*3/uL (ref 0.0–0.7)
Eosinophils Relative: 0 %
HEMATOCRIT: 41 % (ref 39.0–52.0)
HEMOGLOBIN: 13.7 g/dL (ref 13.0–17.0)
Lymphocytes Relative: 6 %
Lymphs Abs: 0.4 10*3/uL — ABNORMAL LOW (ref 0.7–4.0)
MCH: 29.3 pg (ref 26.0–34.0)
MCHC: 33.4 g/dL (ref 30.0–36.0)
MCV: 87.6 fL (ref 78.0–100.0)
MONOS PCT: 2 %
Monocytes Absolute: 0.1 10*3/uL (ref 0.1–1.0)
NEUTROS ABS: 6.9 10*3/uL (ref 1.7–7.7)
NEUTROS PCT: 92 %
PLATELETS: 152 10*3/uL (ref 150–400)
RBC: 4.68 MIL/uL (ref 4.22–5.81)
RDW: 13.8 % (ref 11.5–15.5)
WBC: 7.5 10*3/uL (ref 4.0–10.5)

## 2017-09-04 LAB — LACTIC ACID, PLASMA
LACTIC ACID, VENOUS: 2.4 mmol/L — AB (ref 0.5–1.9)
LACTIC ACID, VENOUS: 1.7 mmol/L (ref 0.5–1.9)

## 2017-09-04 LAB — RESPIRATORY PANEL BY PCR
Adenovirus: NOT DETECTED
BORDETELLA PERTUSSIS-RVPCR: NOT DETECTED
CHLAMYDOPHILA PNEUMONIAE-RVPPCR: NOT DETECTED
Coronavirus 229E: NOT DETECTED
Coronavirus HKU1: NOT DETECTED
Coronavirus NL63: NOT DETECTED
Coronavirus OC43: NOT DETECTED
INFLUENZA B-RVPPCR: NOT DETECTED
Influenza A: NOT DETECTED
MYCOPLASMA PNEUMONIAE-RVPPCR: NOT DETECTED
Metapneumovirus: NOT DETECTED
PARAINFLUENZA VIRUS 3-RVPPCR: NOT DETECTED
PARAINFLUENZA VIRUS 4-RVPPCR: NOT DETECTED
Parainfluenza Virus 1: NOT DETECTED
Parainfluenza Virus 2: NOT DETECTED
RESPIRATORY SYNCYTIAL VIRUS-RVPPCR: NOT DETECTED
RHINOVIRUS / ENTEROVIRUS - RVPPCR: DETECTED — AB

## 2017-09-04 LAB — BASIC METABOLIC PANEL
ANION GAP: 9 (ref 5–15)
BUN: 18 mg/dL (ref 6–20)
CALCIUM: 8.4 mg/dL — AB (ref 8.9–10.3)
CO2: 23 mmol/L (ref 22–32)
CREATININE: 1.01 mg/dL (ref 0.61–1.24)
Chloride: 106 mmol/L (ref 101–111)
Glucose, Bld: 171 mg/dL — ABNORMAL HIGH (ref 65–99)
Potassium: 4.2 mmol/L (ref 3.5–5.1)
SODIUM: 138 mmol/L (ref 135–145)

## 2017-09-04 LAB — MRSA PCR SCREENING: MRSA by PCR: NEGATIVE

## 2017-09-04 LAB — PROCALCITONIN

## 2017-09-04 LAB — INFLUENZA PANEL BY PCR (TYPE A & B)
Influenza A By PCR: NEGATIVE
Influenza B By PCR: NEGATIVE

## 2017-09-04 LAB — STREP PNEUMONIAE URINARY ANTIGEN: Strep Pneumo Urinary Antigen: NEGATIVE

## 2017-09-04 MED ORDER — PREDNISONE 20 MG PO TABS
60.0000 mg | ORAL_TABLET | Freq: Every day | ORAL | Status: DC
  Administered 2017-09-04: 60 mg via ORAL
  Filled 2017-09-04 (×2): qty 3

## 2017-09-04 MED ORDER — CLOBETASOL PROPIONATE 0.05 % EX OINT
TOPICAL_OINTMENT | Freq: Two times a day (BID) | CUTANEOUS | Status: DC | PRN
  Filled 2017-09-04: qty 15

## 2017-09-04 MED ORDER — SODIUM CHLORIDE 0.9 % IV BOLUS (SEPSIS)
500.0000 mL | Freq: Once | INTRAVENOUS | Status: AC
  Administered 2017-09-04: 500 mL via INTRAVENOUS

## 2017-09-04 NOTE — Progress Notes (Signed)
Pts droplet precautions discontinued d/t no fever and VSS. Pt receiving IV abx. No order for droplet precautions as well as no note in system as to why pt was placed on droplet precautions. Will continue to monitor pt. Urine sent to lab.

## 2017-09-04 NOTE — Progress Notes (Signed)
*  PRELIMINARY RESULTS* Echocardiogram 2D Echocardiogram has been performed.  Samuel Germany 09/04/2017, 4:19 PM

## 2017-09-04 NOTE — Progress Notes (Signed)
PROGRESS NOTE  Tyler Mejia SAY:[id number] DOB: 1936/08/23 DOA: 09/03/2017 PCP: [name], MD  Brief History:  81 year old male with a history of Alzheimer's dementia, coronary artery disease, NSTEMI, atrial fibrillation, COPD, essential hypertension, stroke in January 2017, hyperlipidemia presenting with 10 day history of shortness of breath and coughing. The patient normally resides at [facility].  On the morning of 09/03/2017, the patient developed a fever with associated generalized weakness. As a result, the patient was sent to the emergency department for further evaluation. The patient was found to have fever 102.72F,WBC 13.4. Lactic acid 3.3 and 1. Chest x-ray showed chronic interstitial markings with bibasilar atelectasis.  He was started on ceftriaxone and azithromycin.    Assessment/Plan: Sepsis -Likely due to pulmonary source and possible bacteremia -Lactic acid peaked at 3.31 -Check procalcitonin -Continue ceftriaxone and azithromycin pending culture data -Urinalysis negative for pyuria -Personally reviewed chest x-ray--bibasilar atelectasis, chronic interstitial markings, vascular congestion  Acute respiratory failure with hypoxia -Secondary to infectious process -check influenza PCR -check viral respiratory panel -stable on 2L -wean for saturation >92%  COPD Exacerbation -given solumedrol 125 mg in ED -continue prednisone -start Duonebs  Coronary Artery disease -s/p CABG 08/2015 -no angina -personally reviewed 09/04/17 EKG--sinus with nonspecific T wave changes  Hyperglycemia -check A1C  Post-operative Afib -presently in sinus  Hyperlipidemia -multiple intolerances to statins  Alzheimer's Dementia without behavioral disturbance -continue trazodone    Disposition Plan:   Home in 2-3 days  Family Communication:   Family at bedside  Consultants:  none  Code Status:  DNR  DVT Prophylaxis:   Palmer Lovenox   Procedures: As Listed  in Progress Note Above  Antibiotics: None    Subjective: Patient denies fevers, chills, headache, chest pain, dyspnea, nausea, vomiting, diarrhea, abdominal pain, dysuria, hematuria, hematochezia, and melena.   Objective: Vitals:   09/03/17 2144 09/04/17 0224 09/04/17 0643 09/04/17 0827  BP: 128/64  122/66   Pulse: 96  98   Resp: 20  20   Temp: (!) 102.1 F (38.9 C)  100.2 F (37.9 C)   TempSrc: Oral  Oral   SpO2: 95% 92% 93% 93%  Weight: 112.8 kg (248 lb 9.6 oz)     Height: 5\' 9"  (1.753 m)       Intake/Output Summary (Last 24 hours) at 09/04/17 0945 Last data filed at 09/04/17 0644  Gross per 24 hour  Intake          3011.19 ml  Output              950 ml  Net          2061.19 ml   Weight change:  Exam:   General:  Pt is alert, follows commands appropriately, not in acute distress  HEENT: No icterus, No thrush, No neck mass, [employer]/AT  Cardiovascular: RRR, S1/S2, no rubs, no gallops  Respiratory: bilateral scattered rhonchi with wheeze  Abdomen: Soft/+BS, non tender, non distended, no guarding  Extremities: 1+ LE edema, No lymphangitis, No petechiae, No rashes, no synovitis   Data Reviewed: I have personally reviewed following labs and imaging studies Basic Metabolic Panel:  Recent Labs Lab 09/03/17 1720 09/04/17 0433  NA 139 138  K 4.4 4.2  CL 104 106  CO2 25 23  GLUCOSE 115* 171*  BUN 25* 18  CREATININE 1.19 1.01  CALCIUM 9.3 8.4*  MG 1.8  --   PHOS 2.8  --    Liver Function  Tests:  Recent Labs Lab 09/03/17 1720  AST 29  ALT 24  ALKPHOS 63  BILITOT 1.0  PROT 7.4  ALBUMIN 3.9   No results for input(s): LIPASE, AMYLASE in the last 168 hours. No results for input(s): AMMONIA in the last 168 hours. Coagulation Profile:  Recent Labs Lab 09/03/17 1720  INR 1.04   CBC:  Recent Labs Lab 09/03/17 1720 09/04/17 0433  WBC 13.4* 7.5  NEUTROABS 11.2* 6.9  HGB 14.8 13.7  HCT 45.0 41.0  MCV 86.7 87.6  PLT 180 152   Cardiac  Enzymes:  Recent Labs Lab 09/03/17 1722  TROPONINI <0.03   BNP: Invalid input(s): POCBNP CBG: No results for input(s): GLUCAP in the last 168 hours. HbA1C: No results for input(s): HGBA1C in the last 72 hours. Urine analysis:    Component Value Date/Time   COLORURINE STRAW (A) 09/03/2017 1714   APPEARANCEUR CLEAR 09/03/2017 1714   LABSPEC 1.004 (L) 09/03/2017 1714   PHURINE 5.0 09/03/2017 1714   GLUCOSEU NEGATIVE 09/03/2017 1714   HGBUR NEGATIVE 09/03/2017 1714   BILIRUBINUR NEGATIVE 09/03/2017 1714   KETONESUR NEGATIVE 09/03/2017 1714   PROTEINUR NEGATIVE 09/03/2017 1714   UROBILINOGEN 0.2 08/23/2015 0443   NITRITE NEGATIVE 09/03/2017 1714   LEUKOCYTESUR NEGATIVE 09/03/2017 1714   Sepsis Labs: @LABRCNTIP (procalcitonin:4,lacticidven:4) ) Recent Results (from the past 240 hour(s))  Culture, blood (Routine x 2)     Status: None (Preliminary result)   Collection Time: 09/03/17  5:20 PM  Result Value Ref Range Status   Specimen Description BLOOD LEFT FOREARM  Final   Special Requests   Final    BOTTLES DRAWN AEROBIC AND ANAEROBIC Blood Culture adequate volume   Culture NO GROWTH < 24 HOURS  Final   Report Status PENDING  Incomplete  Culture, blood (Routine x 2)     Status: None (Preliminary result)   Collection Time: 09/03/17  5:21 PM  Result Value Ref Range Status   Specimen Description BLOOD RIGHT FOREARM  Final   Special Requests   Final    BOTTLES DRAWN AEROBIC AND ANAEROBIC Blood Culture adequate volume   Culture NO GROWTH < 24 HOURS  Final   Report Status PENDING  Incomplete  MRSA PCR Screening     Status: None   Collection Time: 09/03/17 10:50 PM  Result Value Ref Range Status   MRSA by PCR NEGATIVE NEGATIVE Final    Comment:        The GeneXpert MRSA Assay (FDA approved for NASAL specimens only), is one component of a comprehensive MRSA colonization surveillance program. It is not intended to diagnose MRSA infection nor to guide or monitor treatment  for MRSA infections.      Scheduled Meds: . enoxaparin (LOVENOX) injection  40 mg Subcutaneous Q24H  . famotidine  20 mg Oral BID  . Influenza vac split quadrivalent PF  0.5 mL Intramuscular Tomorrow-1000  . ipratropium-albuterol  3 mL Nebulization Q6H  . loperamide  2 mg Oral Daily  . tamsulosin  0.4 mg Oral Daily  . traZODone  50 mg Oral QHS  . triamcinolone cream  1 application Topical BID   Continuous Infusions: . sodium chloride 10 mL/hr at 09/04/17 0410  . azithromycin    . cefTRIAXone (ROCEPHIN)  IV      Procedures/Studies: Dg Chest 2 View  Result Date: 09/03/2017 CLINICAL DATA:  Cough for 1 week, shortness of breath. Generalized weakness. History of COPD. EXAM: CHEST  2 VIEW COMPARISON:  Chest radiograph December 21, 2015 FINDINGS: Habitus  limited examination. The cardiac silhouette is at least mildly enlarged. Status post median sternotomy for CABG. No pleural effusion. Bibasilar strandy densities. No pneumothorax. Soft tissue planes and included osseous structures are nonsuspicious. Fractured superior sternal wires. IMPRESSION: Mild cardiomegaly. Bibasilar atelectasis/ scarring on this habitus limited examination. Electronically Signed   By: Elon Alas M.D.   On: 09/03/2017 17:50    Graden Hoshino, DO  [facility] Pager [phone]  If 7PM-7AM, please contact night-coverage [email or web address] Password [email or web address] 09/04/2017, 9:45 AM   LOS: 1 day

## 2017-09-04 NOTE — Progress Notes (Signed)
CRITICAL VALUE ALERT  Critical Value:  Lactic Acid 2.4  Date & Time Notied:  09/04/17 @ 0015  Provider Notified: Raliegh Ip Schorr  Orders Received/Actions taken: Waiting for call back/orders

## 2017-09-04 NOTE — Plan of Care (Signed)
Problem: Safety: Goal: Ability to remain free from injury will improve Outcome: Progressing Pt high fall risk d/t previous fall before admission as well as difficulty hearing and medications. Pt educated on safety precautions as well as interventions in place. Bed in lowest locked position, SR up x3, bed alarm in place, call bell within reach. Pt verbalized understanding. Will continue to monitor pt

## 2017-09-04 NOTE — Progress Notes (Signed)
After advising Dr. Hilbert Bible about pts Lactic acid level, new order for 500 mL bolus. RN starting bolus now. Will continue to monitor pt

## 2017-09-04 NOTE — Progress Notes (Signed)
Patient placed on Droplet precautions per MD's orders. Patient was educated on Droplet precaution, educational  carenotes given,patient verbalized understanding. Will continue to monitor patient.

## 2017-09-04 NOTE — Progress Notes (Signed)
CRITICAL VALUE ALERT  Critical Value:  Aerobic blood culture bottle, gram + cocci  Date & Time Notied:  09/04/17@ 2324  Provider Notified: Dr, Rhunette Croft blount  Orders Received/Actions taken: Waiting for orders/call back

## 2017-09-05 LAB — BLOOD CULTURE ID PANEL (REFLEXED)
Acinetobacter baumannii: NOT DETECTED
CANDIDA ALBICANS: NOT DETECTED
CANDIDA GLABRATA: NOT DETECTED
CANDIDA KRUSEI: NOT DETECTED
CANDIDA TROPICALIS: NOT DETECTED
Candida parapsilosis: NOT DETECTED
ENTEROBACTER CLOACAE COMPLEX: NOT DETECTED
ENTEROBACTERIACEAE SPECIES: NOT DETECTED
ESCHERICHIA COLI: NOT DETECTED
Enterococcus species: NOT DETECTED
Haemophilus influenzae: NOT DETECTED
KLEBSIELLA PNEUMONIAE: NOT DETECTED
Klebsiella oxytoca: NOT DETECTED
LISTERIA MONOCYTOGENES: NOT DETECTED
Methicillin resistance: NOT DETECTED
Neisseria meningitidis: NOT DETECTED
PROTEUS SPECIES: NOT DETECTED
Pseudomonas aeruginosa: NOT DETECTED
STAPHYLOCOCCUS SPECIES: DETECTED — AB
STREPTOCOCCUS AGALACTIAE: NOT DETECTED
STREPTOCOCCUS PYOGENES: NOT DETECTED
Serratia marcescens: NOT DETECTED
Staphylococcus aureus (BCID): NOT DETECTED
Streptococcus pneumoniae: NOT DETECTED
Streptococcus species: NOT DETECTED

## 2017-09-05 LAB — CBC WITH DIFFERENTIAL/PLATELET
Basophils Absolute: 0 10*3/uL (ref 0.0–0.1)
Basophils Relative: 0 %
EOS ABS: 0 10*3/uL (ref 0.0–0.7)
EOS PCT: 0 %
HCT: 41.6 % (ref 39.0–52.0)
Hemoglobin: 13.4 g/dL (ref 13.0–17.0)
LYMPHS ABS: 1.3 10*3/uL (ref 0.7–4.0)
Lymphocytes Relative: 11 %
MCH: 28.1 pg (ref 26.0–34.0)
MCHC: 32.2 g/dL (ref 30.0–36.0)
MCV: 87.2 fL (ref 78.0–100.0)
MONO ABS: 1.5 10*3/uL — AB (ref 0.1–1.0)
MONOS PCT: 12 %
Neutro Abs: 9.3 10*3/uL — ABNORMAL HIGH (ref 1.7–7.7)
Neutrophils Relative %: 77 %
PLATELETS: 144 10*3/uL — AB (ref 150–400)
RBC: 4.77 MIL/uL (ref 4.22–5.81)
RDW: 13.8 % (ref 11.5–15.5)
WBC: 12.1 10*3/uL — AB (ref 4.0–10.5)

## 2017-09-05 LAB — PROCALCITONIN

## 2017-09-05 LAB — BASIC METABOLIC PANEL
Anion gap: 8 (ref 5–15)
BUN: 23 mg/dL — AB (ref 6–20)
CO2: 25 mmol/L (ref 22–32)
CREATININE: 1.01 mg/dL (ref 0.61–1.24)
Calcium: 8.2 mg/dL — ABNORMAL LOW (ref 8.9–10.3)
Chloride: 105 mmol/L (ref 101–111)
GFR calc Af Amer: >60 mL/min (ref >60)
Glucose, Bld: 118 mg/dL — ABNORMAL HIGH (ref 65–99)
Potassium: 4.1 mmol/L (ref 3.5–5.1)
Sodium: 138 mmol/L (ref 135–145)

## 2017-09-05 LAB — HEMOGLOBIN A1C
Hgb A1c MFr Bld: 5.5 % (ref 4.8–5.6)
Mean Plasma Glucose: 111.15 mg/dL

## 2017-09-05 LAB — MAGNESIUM: Magnesium: 2 mg/dL (ref 1.7–2.4)

## 2017-09-05 MED ORDER — BUDESONIDE 0.25 MG/2ML IN SUSP
0.2500 mg | Freq: Two times a day (BID) | RESPIRATORY_TRACT | Status: DC
  Administered 2017-09-05 – 2017-09-06 (×3): 0.25 mg via RESPIRATORY_TRACT
  Filled 2017-09-05 (×3): qty 2

## 2017-09-05 MED ORDER — RESOURCE THICKENUP CLEAR PO POWD
ORAL | Status: DC | PRN
  Filled 2017-09-05: qty 125

## 2017-09-05 MED ORDER — METHYLPREDNISOLONE SODIUM SUCC 125 MG IJ SOLR
60.0000 mg | Freq: Three times a day (TID) | INTRAMUSCULAR | Status: AC
  Administered 2017-09-05 – 2017-09-09 (×11): 60 mg via INTRAVENOUS
  Filled 2017-09-05 (×12): qty 2

## 2017-09-05 MED ORDER — IPRATROPIUM-ALBUTEROL 0.5-2.5 (3) MG/3ML IN SOLN
3.0000 mL | Freq: Four times a day (QID) | RESPIRATORY_TRACT | Status: DC
  Administered 2017-09-06 – 2017-09-09 (×15): 3 mL via RESPIRATORY_TRACT
  Filled 2017-09-05 (×15): qty 3

## 2017-09-05 MED ORDER — MENTHOL 3 MG MT LOZG: 1.0000 | LOZENGE | OROMUCOSAL | Status: DC | PRN

## 2017-09-05 MED ORDER — DEXTROSE 5 % IV SOLN
2.0000 g | INTRAVENOUS | Status: DC
  Filled 2017-09-05 (×3): qty 2

## 2017-09-05 MED ORDER — CEPASTAT 14.5 MG MT LOZG
1.0000 | LOZENGE | OROMUCOSAL | Status: DC | PRN
  Filled 2017-09-05: qty 9

## 2017-09-05 NOTE — Care Management Important Message (Signed)
Important Message  Patient Details  Name: BRALYNN DONADO MRN: [id number] Date of Birth: [birth year]   Medicare Important Message Given:  Yes    Sherald Barge, RN 09/05/2017, 9:17 AM

## 2017-09-05 NOTE — Plan of Care (Signed)
Problem: Safety: Goal: Ability to remain free from injury will improve Outcome: Adequate for Discharge Pt continues to be on high fall risk d/t multiple falls prior to admission. Pt alert and oriented, educated on safety and interventions in place d/t prevent falls. Bed in lowest locked position, SR up x3, call bell within reach, bed alarm on, fall socks and fall bracelet on. Pt verbalized understanding. Will continue to monitor pt

## 2017-09-05 NOTE — Progress Notes (Signed)
Pharmacy Antibiotic Note  Tyler Mejia is a 81 y.o. male admitted on 09/03/2017 with pneumonia.  Pharmacy has been consulted for Rocephin and zithromax dosing.  1/2 BCx (+) with CONS, likely contamination.  Plan: Rocephin 2gm IV q24h Zithromax 500mg  IV q24h Deescalate as indicated to po tx F/U cx and clinical progress  Height: 5\' 9"  (175.3 cm) Weight: 248 lb 9.6 oz (112.8 kg) IBW/kg (Calculated) : 70.7  Temp (24hrs), Avg:98.5 F (36.9 C), Min:98.3 F (36.8 C), Max:98.9 F (37.2 C)   Recent Labs Lab 09/03/17 1720 09/03/17 1729 09/03/17 2316 09/04/17 0433 09/05/17 0622  WBC 13.4*  --   --  7.5 12.1*  CREATININE 1.19  --   --  1.01 1.01  LATICACIDVEN  --  3.31* 2.4* 1.7  --     Estimated Creatinine Clearance: 73.4 mL/min (by C-G formula based on SCr of 1.01 mg/dL).    Allergies  Allergen Reactions  . Codeine Other (See Comments)    Pt does not know   . Statins Other (See Comments) and Nausea And Vomiting   Antimicrobials this admission: Ceftriaxone 9/26 >>  Azithromycin 9/26 >>   Microbiology results: 9/26 BCx: 1 of 2 w/ CONS, likely contamination   Enterococcus species NOT DETECTED NOT DETECTED     Listeria monocytogenes NOT DETECTED NOT DETECTED     Staphylococcus species NOT DETECTED DETECTED     Comment: Methicillin (oxacillin) susceptible coagulase negative staphylococcus. Possible blood culture contaminant (unless isolated from more than one blood culture draw or clinical case suggests pathogenicity). No antibiotic treatment is indicated for blood  culture contaminants.  CRITICAL RESULT CALLED TO, READ BACK BY AND VERIFIED WITH:  A.AMBURN RN 09/05/17 0700 L.CHAMPION    Staphylococcus aureus NOT DETECTED NOT DETECTED  NEGATIVER, CM    Methicillin resistance NOT DETECTED NOT DETECTED     Streptococcus species NOT DETECTED NOT DETECTED     Streptococcus agalactiae NOT DETECTED NOT DETECTED     Streptococcus pneumoniae NOT DETECTED NOT DETECTED     Streptococcus pyogenes NOT DETECTED NOT DETECTED     Acinetobacter baumannii NOT DETECTED NOT DETECTED     Enterobacteriaceae species NOT DETECTED NOT DETECTED     Enterobacter cloacae complex NOT DETECTED NOT DETECTED     Escherichia coli NOT DETECTED NOT DETECTED     Klebsiella oxytoca NOT DETECTED NOT DETECTED     Klebsiella pneumoniae NOT DETECTED NOT DETECTED     Proteus species NOT DETECTED NOT DETECTED     Serratia marcescens NOT DETECTED NOT DETECTED     Haemophilus influenzae NOT DETECTED NOT DETECTED     Neisseria meningitidis NOT DETECTED NOT DETECTED     Pseudomonas aeruginosa NOT DETECTED NOT DETECTED     Candida albicans NOT DETECTED NOT DETECTED     Candida glabrata NOT DETECTED NOT DETECTED     Candida krusei NOT DETECTED NOT DETECTED     Candida parapsilosis NOT DETECTED NOT DETECTED     Candida tropicalis NOT DETECTED NOT DETECTED     Thank you for allowing pharmacy to be a part of this patient's care.  Hart Robinsons, PharmD Clinical Pharmacist Pager:  [phone] 09/05/2017   09/05/2017 10:01 AM

## 2017-09-05 NOTE — Progress Notes (Signed)
Pt c/o sore throat -I examined his mouth and pharynx-->NO thrush, no ulcers, no exudates -pharyngitis likely related to enterovirus/rhinovirus infection -cepacol lozenges prn  DTat

## 2017-09-05 NOTE — Evaluation (Signed)
Clinical/Bedside Swallow Evaluation Patient Details  Name: MACDONALD RIGOR MRN: [id number] Date of Birth: 09-09-1936  Today's Date: 09/05/2017 Time: SLP Start Time (ACUTE ONLY): 3154 SLP Stop Time (ACUTE ONLY): 1755 SLP Time Calculation (min) (ACUTE ONLY): 25 min  Past Medical History:  Past Medical History:  Diagnosis Date  . Alzheimer's dementia   . Atrial fibrillation, transient ([place])    a. Post-op from CABG 08/2015.  Marland Kitchen CAD (coronary artery disease)    a. DES to the RCA March 2013 with residual dz rx medically. b. s/p CABGx4 in 08/2015.  . Collagen vascular disease ([place])   . COPD (chronic obstructive pulmonary disease) ([place])   . Essential hypertension   . GERD (gastroesophageal reflux disease)   . History of stroke    January 2017  . HLD (hyperlipidemia)    a. H/o multiple statin intolerances.  Marland Kitchen Hx of adenomatous colonic polyps   . IBS (irritable bowel syndrome)   . Nephrolithiasis   . NSTEMI (non-ST elevated myocardial infarction) [facility]) March 2013  . Obesity   . Stroke [facility])    Past Surgical History:  Past Surgical History:  Procedure Laterality Date  . APPENDECTOMY    . BACK SURGERY    . CARDIAC CATHETERIZATION N/A 08/09/2015   Procedure: Left Heart Cath and Coronary Angiography;  Surgeon: Jettie Booze, MD;  Location: [place] CV LAB;  Service: Cardiovascular;  Laterality: N/A;  . COLONOSCOPY  08/21/2004   Pedunculated polyp at the splenic flexure  . COLONOSCOPY  09/2009   Dr. Rourk--> Cecal and hepatic flexure polyps, tubular adenomas. Next colonoscopy October 2013  . COLONOSCOPY N/A 05/07/2013   RMR: colonic polyp/colonic diverticulosis  . CORONARY ARTERY BYPASS GRAFT N/A 08/11/2015   Procedure: CORONARY ARTERY BYPASS GRAFTING (CABG) X4 UTILIZING THE LEFT INTERNAL MAMMARY ARTERY TO LAD AND ENDOSCOPICALLY HARVESTED BILATERAL SAPHENEOUS VEINS TO DIAGONAL,OM1 AND OM2;  Surgeon: Ivin Poot, MD;  Location: [place];  Service: Open Heart Surgery;  Laterality: N/A;   . ESOPHAGOGASTRODUODENOSCOPY  2003   distal erosion/small hiatal hernia  . ESOPHAGOGASTRODUODENOSCOPY  09/2009   Dr. Evalee Mutton ring, mild distal esophageal erosions, status post Maloney dilation, small hiatal hernia.  Marland Kitchen ESOPHAGOGASTRODUODENOSCOPY N/A 03/16/2015   MGQ:QPYPP HH/focal area of distal esophageal s/p bx  . LEFT HEART CATHETERIZATION WITH CORONARY ANGIOGRAM N/A 03/02/2012   Procedure: LEFT HEART CATHETERIZATION WITH CORONARY ANGIOGRAM;  Surgeon: Peter M Martinique, MD;  Location: [facility] C[facility] LAB;  Service: Cardiovascular;  Laterality: N/A;  . PERCUTANEOUS CORONARY STENT INTERVENTION (PCI-S) N/A 03/02/2012   Procedure: PERCUTANEOUS CORONARY STENT INTERVENTION (PCI-S);  Surgeon: Peter M Martinique, MD;  Location: [facility] CATH LAB;  Service: Cardiovascular;  Laterality: N/A;  . TEE WITHOUT CARDIOVERSION N/A 08/11/2015   Procedure: TRANSESOPHAGEAL ECHOCARDIOGRAM (TEE);  Surgeon: Ivin Poot, MD;  Location: [place];  Service: Open Heart Surgery;  Laterality: N/A;   HPI:  Pt is a 81 y.o. male with PMH of Alzheimer's dementia, CAD/CABG, non-STEMI, transient atrial fibrillation (postop from CABG in September 2016), grade 1 diastolic dysfunction, collagenous vascular disease, COPD, essential hypertension, GERD, history of CVA in January 2017, hyperlipidemia with statin intolerance, IBS, nephrolithiasis, obesity who was brought from his assisted living facility due to having a cough for about a week and fever. CXR on 9/26 showed bibasilar atelectasis/ scarring. MBS on 01/15/16 showed aspiration of thin liquids of variable amounts with pt showing difficulty following recommended strategies. The recommended diet was dysphagia 3/ nectar thick liquids with meds crushed in puree; this diet was initiated  on this hospital admission as well. Pt also with c/o sore throat. Pt has been afebrile on this date. Bedside swallow evaluation ordered.    Assessment / Plan / Recommendation Clinical Impression  Pt with history of  dysphagia; during this evaluation noted to be impulsive with self-feeding/ taking large bites at a time and had intermittent delayed coughing while consuming meal. Pt did follow cues to take small bites/ sips. No overt s/s of aspiration noted with nectar-thick liquids; however, pt had mildly rattly breath sounds throughout PO intake which improved when cued to cough. Daughter arrived at bedside during the evaluation and expressed frustration with numerous swallowing evaluations being completed at nursing facility. She shared that the pt has not had PNA recently and feels that as long as pt has assistance during meal to remind him to take smaller bites/ sips, he appears to do well. She expressed that she does not wish for his diet to be downgraded any further to have the negative impact on his quality of life; however, she seemed comfortable with the possibility of a repeat MBS if warranted. Recommend continuing with diet/ precautions from previous MBS in 2017- dysphagia 3/ nectar thick liquids, full supervision to cue pt to take small bites/ sips and that pt is seated fully upright. Will f/u to ensure diet tolerance/ consider objective evaluation. SLP Visit Diagnosis: Dysphagia, unspecified (R13.10)    Aspiration Risk  Moderate aspiration risk    Diet Recommendation Dysphagia 3 (Mech soft);Nectar-thick liquid   Liquid Administration via: Cup;Straw Medication Administration: Whole meds with liquid (with nectar thick liquids) Supervision: Patient able to self feed;Full supervision/cueing for compensatory strategies Compensations: Slow rate;Small sips/bites Postural Changes: Seated upright at 90 degrees;Remain upright for at least 30 minutes after po intake    Other  Recommendations Oral Care Recommendations: Oral care BID   Follow up Recommendations Skilled Nursing facility      Frequency and Duration min 1 x/week  1 week       Prognosis Prognosis for Safe Diet Advancement: Guarded Barriers to  Reach Goals: Cognitive deficits;Time post onset      Swallow Study   General HPI: Pt is a 81 y.o. male with PMH of Alzheimer's dementia, CAD/CABG, non-STEMI, transient atrial fibrillation (postop from CABG in September 2016), grade 1 diastolic dysfunction, collagenous vascular disease, COPD, essential hypertension, GERD, history of CVA in January 2017, hyperlipidemia with statin intolerance, IBS, nephrolithiasis, obesity who was brought from his assisted living facility due to having a cough for about a week and fever. CXR on 9/26 showed bibasilar atelectasis/ scarring. MBS on 01/15/16 showed aspiration of thin liquids of variable amounts with pt showing difficulty following recommended strategies. The recommended diet was dysphagia 3/ nectar thick liquids with meds crushed in puree; this diet was initiated on this hospital admission as well. Pt also with c/o sore throat. Pt has been afebrile on this date. Bedside swallow evaluation ordered.  Type of Study: Bedside Swallow Evaluation Previous Swallow Assessment: See HPI Diet Prior to this Study: Dysphagia 3 (soft);Nectar-thick liquids Temperature Spikes Noted: Yes Respiratory Status: Nasal cannula History of Recent Intubation: No Behavior/Cognition: Alert;Cooperative;Pleasant mood;Requires cueing Oral Cavity Assessment: Within Functional Limits Oral Care Completed by SLP: No Oral Cavity - Dentition: Adequate natural dentition Vision: Functional for self-feeding Self-Feeding Abilities: Able to feed self Patient Positioning: Upright in bed Baseline Vocal Quality: Normal Volitional Cough: Strong Volitional Swallow: Able to elicit    Oral/Motor/Sensory Function Overall Oral Motor/Sensory Function: Within functional limits   Ice Chips Ice chips:  Not tested   Thin Liquid Thin Liquid: Not tested    Nectar Thick Nectar Thick Liquid: Within functional limits Presentation: Straw   Honey Thick Honey Thick Liquid: Not tested   Puree Puree: Not tested    Solid   GO   Solid: Impaired Presentation: Self Fed;Spoon Oral Phase Impairments: Other (comment) (impulsive feeding) Pharyngeal Phase Impairments: Cough - Delayed        Chessie Neuharth Dionicia Abler, MA, CCC-SLP 09/05/2017,6:03 PM

## 2017-09-05 NOTE — Progress Notes (Signed)
PROGRESS NOTE  Tyler Mejia UTM:[id number] DOB: 10/25/1936 DOA: 09/03/2017 PCP: [name], MD  Brief History:  81 year old male with a history of Alzheimer's dementia, coronary artery disease, NSTEMI, atrial fibrillation, COPD, essential hypertension, stroke in January 2017, hyperlipidemia presenting with 10 day history of shortness of breath and coughing. The patient normally resides at [facility].  On the morning of 09/03/2017, the patient developed a fever with associated generalized weakness. As a result, the patient was sent to the emergency department for further evaluation. The patient was found to have fever 102.27F,WBC 13.4. Lactic acid 3.3 and 1. Chest x-ray showed chronic interstitial markings with bibasilar atelectasis.  He was started on ceftriaxone and azithromycin.    Assessment/Plan: SIRS -sepsis ruled out -Lactic acid peaked at 3.31>>>1.7 -Check procalcitonin<0.10 -elevated lactic acid due to volume depletion and hypoxemia -Continue azithromycin  -Urinalysis negative for pyuria -Personally reviewed chest x-ray--bibasilar atelectasis, chronic interstitial markings, vascular congestion  Acute respiratory failure with hypoxia -Secondary to infectious process/COPD exacerbation -check influenza PCR-negative -check viral respiratory panel--POSITIVE Rhinovirus/Enterovirus -stable on 1.5L -wean for saturation >92%  COPD Exacerbation -likely due to rhinovirus/enterovirus -start IV solumedrol -start pulmicort -continue Duonebs  Coronary Artery disease -s/p CABG 08/2015 -no angina -personally reviewed 09/04/17 EKG--sinus with nonspecific T wave changes  Hyperglycemia -check A1C--pending  Post-operative Afib -presently in sinus  Hyperlipidemia -multiple intolerances to statins  Alzheimer's Dementia without behavioral disturbance -continue trazodone    Disposition Plan:   [place] in 1-2 days Family Communication:   Left  voicemail for daughter  Consultants:  none  Code Status:  DNR  DVT Prophylaxis:   [place] Lovenox   Procedures: As Listed in Progress Note Above  Antibiotics: Ceftriaxone 9/26>>>9/27 Azithromycin 9/27>>>   Subjective: Patient is breathing better. He is more alert today. He states his cough is only minimally improved. He denies fevers, chills, chest pain, shortness of breath, nausea, vomiting, diarrhea, abdominal pain. No hemoptysis.  Objective: Vitals:   09/04/17 2048 09/05/17 0247 09/05/17 0537 09/05/17 0756  BP: 128/60  133/74   Pulse: 77  68   Resp: 20  20   Temp: 98.3 F (36.8 C)  98.9 F (37.2 C)   TempSrc: Oral  Oral   SpO2: 93% 90% 92% 96%  Weight:      Height:        Intake/Output Summary (Last 24 hours) at 09/05/17 1052 Last data filed at 09/05/17 0917  Gross per 24 hour  Intake           648.33 ml  Output             1200 ml  Net          -551.67 ml   Weight change:  Exam:   General:  Pt is alert, follows commands appropriately, not in acute distress  HEENT: No icterus, No thrush, No neck mass, [place]/AT  Cardiovascular: RRR, S1/S2, no rubs, no gallops  Respiratory: Bilateral scattered rhonchi. Bilateral expiratory wheeze  Abdomen: Soft/+BS, non tender, non distended, no guarding  Extremities: trace LE edema, No lymphangitis, No petechiae, No rashes, no synovitis   Data Reviewed: I have personally reviewed following labs and imaging studies Basic Metabolic Panel:  Recent Labs Lab 09/03/17 1720 09/04/17 0433 09/05/17 0622  NA 139 138 138  K 4.4 4.2 4.1  CL 104 106 105  CO2 25 23 25   GLUCOSE 115* 171* 118*  BUN 25* 18 23*  CREATININE 1.19 1.01 1.01  CALCIUM 9.3 8.4* 8.2*  MG 1.8  --  2.0  PHOS 2.8  --   --    Liver Function Tests:  Recent Labs Lab 09/03/17 1720  AST 29  ALT 24  ALKPHOS 63  BILITOT 1.0  PROT 7.4  ALBUMIN 3.9   No results for input(s): LIPASE, AMYLASE in the last 168 hours. No results for input(s): AMMONIA  in the last 168 hours. Coagulation Profile:  Recent Labs Lab 09/03/17 1720  INR 1.04   CBC:  Recent Labs Lab 09/03/17 1720 09/04/17 0433 09/05/17 0622  WBC 13.4* 7.5 12.1*  NEUTROABS 11.2* 6.9 9.3*  HGB 14.8 13.7 13.4  HCT 45.0 41.0 41.6  MCV 86.7 87.6 87.2  PLT 180 152 144*   Cardiac Enzymes:  Recent Labs Lab 09/03/17 1722  TROPONINI <0.03   BNP: Invalid input(s): POCBNP CBG: No results for input(s): GLUCAP in the last 168 hours. HbA1C: No results for input(s): HGBA1C in the last 72 hours. Urine analysis:    Component Value Date/Time   COLORURINE STRAW (A) 09/03/2017 1714   APPEARANCEUR CLEAR 09/03/2017 1714   LABSPEC 1.004 (L) 09/03/2017 1714   PHURINE 5.0 09/03/2017 1714   GLUCOSEU NEGATIVE 09/03/2017 1714   HGBUR NEGATIVE 09/03/2017 1714   BILIRUBINUR NEGATIVE 09/03/2017 1714   KETONESUR NEGATIVE 09/03/2017 1714   PROTEINUR NEGATIVE 09/03/2017 1714   UROBILINOGEN 0.2 08/23/2015 0443   NITRITE NEGATIVE 09/03/2017 1714   LEUKOCYTESUR NEGATIVE 09/03/2017 1714   Sepsis Labs: @LABRCNTIP (procalcitonin:4,lacticidven:4) ) Recent Results (from the past 240 hour(s))  Culture, blood (Routine x 2)     Status: None (Preliminary result)   Collection Time: 09/03/17  5:20 PM  Result Value Ref Range Status   Specimen Description BLOOD LEFT FOREARM  Final   Special Requests   Final    BOTTLES DRAWN AEROBIC AND ANAEROBIC Blood Culture adequate volume   Culture  Setup Time   Final    Performed at [facility] Gram Stain Report Called to,Read Back By and Verified With: AMBURN,ALISON AT 6606 ON 9.27.2018 BY ISLEY,B AEROBIC BOTTLE ONLY    Culture GRAM POSITIVE COCCI  Final   Report Status PENDING  Incomplete  Blood Culture ID Panel (Reflexed)     Status: Abnormal   Collection Time: 09/03/17  5:20 PM  Result Value Ref Range Status   Enterococcus species NOT DETECTED NOT DETECTED Final   Listeria monocytogenes NOT DETECTED NOT DETECTED  Final   Staphylococcus species DETECTED (A) NOT DETECTED Final    Comment: Methicillin (oxacillin) susceptible coagulase negative staphylococcus. Possible blood culture contaminant (unless isolated from more than one blood culture draw or clinical case suggests pathogenicity). No antibiotic treatment is indicated for blood  culture contaminants. CRITICAL RESULT CALLED TO, READ BACK BY AND VERIFIED WITH: A.AMBURN RN 09/05/17 0700 L.CHAMPION    Staphylococcus aureus NOT DETECTED NOT DETECTED Final   Methicillin resistance NOT DETECTED NOT DETECTED Final   Streptococcus species NOT DETECTED NOT DETECTED Final   Streptococcus agalactiae NOT DETECTED NOT DETECTED Final   Streptococcus pneumoniae NOT DETECTED NOT DETECTED Final   Streptococcus pyogenes NOT DETECTED NOT DETECTED Final   Acinetobacter baumannii NOT DETECTED NOT DETECTED Final   Enterobacteriaceae species NOT DETECTED NOT DETECTED Final   Enterobacter cloacae complex NOT DETECTED NOT DETECTED Final   Escherichia coli NOT DETECTED NOT DETECTED Final   Klebsiella oxytoca NOT DETECTED NOT DETECTED Final   Klebsiella pneumoniae NOT DETECTED NOT DETECTED Final   Proteus species NOT DETECTED NOT DETECTED  Final   Serratia marcescens NOT DETECTED NOT DETECTED Final   Haemophilus influenzae NOT DETECTED NOT DETECTED Final   Neisseria meningitidis NOT DETECTED NOT DETECTED Final   Pseudomonas aeruginosa NOT DETECTED NOT DETECTED Final   Candida albicans NOT DETECTED NOT DETECTED Final   Candida glabrata NOT DETECTED NOT DETECTED Final   Candida krusei NOT DETECTED NOT DETECTED Final   Candida parapsilosis NOT DETECTED NOT DETECTED Final   Candida tropicalis NOT DETECTED NOT DETECTED Final    Comment: Performed at [facility], [place] [address], [place], [place] [address]  Culture, blood (Routine x 2)     Status: None (Preliminary result)   Collection Time: 09/03/17  5:21 PM  Result Value Ref Range Status   Specimen Description BLOOD  RIGHT FOREARM  Final   Special Requests   Final    BOTTLES DRAWN AEROBIC AND ANAEROBIC Blood Culture adequate volume   Culture NO GROWTH 2 DAYS  Final   Report Status PENDING  Incomplete  MRSA PCR Screening     Status: None   Collection Time: 09/03/17 10:50 PM  Result Value Ref Range Status   MRSA by PCR NEGATIVE NEGATIVE Final    Comment:        The GeneXpert MRSA Assay (FDA approved for NASAL specimens only), is one component of a comprehensive MRSA colonization surveillance program. It is not intended to diagnose MRSA infection nor to guide or monitor treatment for MRSA infections.   Respiratory Panel by PCR     Status: Abnormal   Collection Time: 09/04/17 10:00 AM  Result Value Ref Range Status   Adenovirus NOT DETECTED NOT DETECTED Final   Coronavirus 229E NOT DETECTED NOT DETECTED Final   Coronavirus HKU1 NOT DETECTED NOT DETECTED Final   Coronavirus NL63 NOT DETECTED NOT DETECTED Final   Coronavirus OC43 NOT DETECTED NOT DETECTED Final   Metapneumovirus NOT DETECTED NOT DETECTED Final   Rhinovirus / Enterovirus DETECTED (A) NOT DETECTED Final   Influenza A NOT DETECTED NOT DETECTED Final   Influenza B NOT DETECTED NOT DETECTED Final   Parainfluenza Virus 1 NOT DETECTED NOT DETECTED Final   Parainfluenza Virus 2 NOT DETECTED NOT DETECTED Final   Parainfluenza Virus 3 NOT DETECTED NOT DETECTED Final   Parainfluenza Virus 4 NOT DETECTED NOT DETECTED Final   Respiratory Syncytial Virus NOT DETECTED NOT DETECTED Final   Bordetella pertussis NOT DETECTED NOT DETECTED Final   Chlamydophila pneumoniae NOT DETECTED NOT DETECTED Final   Mycoplasma pneumoniae NOT DETECTED NOT DETECTED Final    Comment: Performed at [facility], [place] [address], [place], [place] [address]     Scheduled Meds: . budesonide (PULMICORT) nebulizer solution  0.25 mg Nebulization BID  . enoxaparin (LOVENOX) injection  40 mg Subcutaneous Q24H  . famotidine  20 mg Oral BID  .  ipratropium-albuterol  3 mL Nebulization Q6H  . loperamide  2 mg Oral Daily  . methylPREDNISolone (SOLU-MEDROL) injection  60 mg Intravenous Q8H  . tamsulosin  0.4 mg Oral Daily  . traZODone  50 mg Oral QHS   Continuous Infusions: . azithromycin Stopped (09/04/17 2138)    Procedures/Studies: Dg Chest 2 View  Result Date: 09/03/2017 CLINICAL DATA:  Cough for 1 week, shortness of breath. Generalized weakness. History of COPD. EXAM: CHEST  2 VIEW COMPARISON:  Chest radiograph December 21, 2015 FINDINGS: Habitus limited examination. The cardiac silhouette is at least mildly enlarged. Status post median sternotomy for CABG. No pleural effusion. Bibasilar strandy densities. No  pneumothorax. Soft tissue planes and included osseous structures are nonsuspicious. Fractured superior sternal wires. IMPRESSION: Mild cardiomegaly. Bibasilar atelectasis/ scarring on this habitus limited examination. Electronically Signed   By: Elon Alas M.D.   On: 09/03/2017 17:50    Maleya Leever, DO  [facility] Pager [phone]  If 7PM-7AM, please contact night-coverage [email or web address] Password [email or web address] 09/05/2017, 10:52 AM   LOS: 2 days

## 2017-09-06 ENCOUNTER — Inpatient Hospital Stay (HOSPITAL_COMMUNITY): Payer: Medicare Other

## 2017-09-06 LAB — CBC WITH DIFFERENTIAL/PLATELET
BASOS ABS: 0 10*3/uL (ref 0.0–0.1)
Basophils Relative: 0 %
Eosinophils Absolute: 0 10*3/uL (ref 0.0–0.7)
Eosinophils Relative: 0 %
HEMATOCRIT: 42.4 % (ref 39.0–52.0)
HEMOGLOBIN: 13.8 g/dL (ref 13.0–17.0)
LYMPHS PCT: 6 %
Lymphs Abs: 0.7 10*3/uL (ref 0.7–4.0)
MCH: 28.5 pg (ref 26.0–34.0)
MCHC: 32.5 g/dL (ref 30.0–36.0)
MCV: 87.6 fL (ref 78.0–100.0)
MONO ABS: 0.3 10*3/uL (ref 0.1–1.0)
Monocytes Relative: 2 %
NEUTROS ABS: 9.5 10*3/uL — AB (ref 1.7–7.7)
NEUTROS PCT: 92 %
Platelets: 152 10*3/uL (ref 150–400)
RBC: 4.84 MIL/uL (ref 4.22–5.81)
RDW: 13.7 % (ref 11.5–15.5)
WBC: 10.4 10*3/uL (ref 4.0–10.5)

## 2017-09-06 LAB — PROCALCITONIN: Procalcitonin: <0.1 ng/mL

## 2017-09-06 MED ORDER — AZITHROMYCIN 250 MG PO TABS
500.0000 mg | ORAL_TABLET | Freq: Every day | ORAL | Status: DC
  Administered 2017-09-06 – 2017-09-07 (×2): 500 mg via ORAL
  Filled 2017-09-06 (×2): qty 2

## 2017-09-06 MED ORDER — ARFORMOTEROL TARTRATE 15 MCG/2ML IN NEBU
15.0000 ug | INHALATION_SOLUTION | Freq: Two times a day (BID) | RESPIRATORY_TRACT | Status: DC
  Administered 2017-09-06 – 2017-09-09 (×6): 15 ug via RESPIRATORY_TRACT
  Filled 2017-09-06 (×7): qty 2

## 2017-09-06 MED ORDER — BUDESONIDE 0.5 MG/2ML IN SUSP
0.5000 mg | Freq: Two times a day (BID) | RESPIRATORY_TRACT | Status: DC
  Administered 2017-09-06 – 2017-09-09 (×6): 0.5 mg via RESPIRATORY_TRACT
  Filled 2017-09-06 (×6): qty 2

## 2017-09-06 NOTE — Progress Notes (Signed)
PROGRESS NOTE  Tyler Mejia WCB:[id number] DOB: 1936/07/08 DOA: 09/03/2017 PCP: [name], MD  Brief History: 81 year old male with a history of Alzheimer's dementia, coronary artery disease, NSTEMI, atrial fibrillation, COPD, essential hypertension, stroke in January 2017, hyperlipidemia presenting with 10 day history of shortness of breath and coughing. The patient normally resides at [facility]. On the morning of 09/03/2017, the patient developed a fever with associated generalized weakness. As a result, the patient was sent to the emergency department for further evaluation. The patient was found to have fever 102.98F,WBC 13.4. Lactic acid 3.3 and 1. Chest x-ray showed chronic interstitial markings with bibasilar atelectasis. He was started on ceftriaxone and azithromycin.   Assessment/Plan: SIRS -sepsis ruled out -Lactic acid peaked at 3.31>>>1.7 -Check procalcitonin<0.10 -elevated lactic acid due to volume depletion and hypoxemia -Continue azithromycin  -Urinalysis negative for pyuria -repeat cxr  Acute respiratory failure with hypoxia -Secondary to infectious process/COPD exacerbation -check influenza PCR-negative -check viral respiratory panel--POSITIVE Rhinovirus/Enterovirus -stable on 2L -wean for saturation >92% -9/29--repeat CXR  COPD Exacerbation -likely due to rhinovirus/enterovirus -start IV solumedrol -start pulmicort--increase dose to 0.5 -start brovana -continue Duonebs -suspect a degree of vocal cord dysfunction  Coronary Artery disease -s/p CABG 08/2015 -no angina -personally reviewed 09/04/17 EKG--sinus with nonspecific T wave changes  Hyperglycemia -check A1C--5.5 -likely due to steroids  Post-operative Afib -presently in sinus  Hyperlipidemia -multiple intolerances to statins  Alzheimer's Dementia without behavioral disturbance -continue trazodone  Leg pain and edema -venous duplex    Disposition  Plan: [place] 10/1 or 10/2 Family Communication: daughter updated at bedside-Total time spent 35 minutes.  Greater than 50% spent face to face counseling and coordinating care.   Consultants: none  Code Status: DNR  DVT Prophylaxis: [place] Lovenox   Procedures: As Listed in Progress Note Above  Antibiotics: Ceftriaxone 9/26>>>9/27 Azithromycin 9/27>>>     Subjective: Patient feels that his breathing better but still has some dyspnea on exertion. He denies any fevers, chest, chest pain, nausea, vomiting, diarrhea, abdominal pain, dysuria, hematuria. No hematochezia or melena.  Objective: Vitals:   09/06/17 0102 09/06/17 0619 09/06/17 0814 09/06/17 0920  BP:  (!) 151/72    Pulse:  72    Resp:  20    Temp:  97.8 F (36.6 C)    TempSrc:  Oral    SpO2: 92% 93% 94% 94%  Weight:      Height:        Intake/Output Summary (Last 24 hours) at 09/06/17 1141 Last data filed at 09/06/17 0400  Gross per 24 hour  Intake              240 ml  Output             1450 ml  Net            -1210 ml   Weight change:  Exam:   General:  Pt is alert, follows commands appropriately, not in acute distress  HEENT: No icterus, No thrush, No neck mass, [place]/AT  Cardiovascular: RRR, S1/S2, no rubs, no gallops  Respiratory: Bilateral scattered rhonchi. Bilateral scattered wheezing.  Abdomen: Soft/+BS, non tender, non distended, no guarding  Extremities: 1 + LE edema, No lymphangitis, No petechiae, No rashes, no synovitis   Data Reviewed: I have personally reviewed following labs and imaging studies Basic Metabolic Panel:  Recent Labs Lab 09/03/17 1720 09/04/17 0433 09/05/17 0622  NA 139 138 138  K 4.4 4.2  4.1  CL 104 106 105  CO2 25 23 25   GLUCOSE 115* 171* 118*  BUN 25* 18 23*  CREATININE 1.19 1.01 1.01  CALCIUM 9.3 8.4* 8.2*  MG 1.8  --  2.0  PHOS 2.8  --   --    Liver Function Tests:  Recent Labs Lab 09/03/17 1720  AST 29  ALT 24  ALKPHOS 63    BILITOT 1.0  PROT 7.4  ALBUMIN 3.9   No results for input(s): LIPASE, AMYLASE in the last 168 hours. No results for input(s): AMMONIA in the last 168 hours. Coagulation Profile:  Recent Labs Lab 09/03/17 1720  INR 1.04   CBC:  Recent Labs Lab 09/03/17 1720 09/04/17 0433 09/05/17 0622 09/06/17 0544  WBC 13.4* 7.5 12.1* 10.4  NEUTROABS 11.2* 6.9 9.3* 9.5*  HGB 14.8 13.7 13.4 13.8  HCT 45.0 41.0 41.6 42.4  MCV 86.7 87.6 87.2 87.6  PLT 180 152 144* 152   Cardiac Enzymes:  Recent Labs Lab 09/03/17 1722  TROPONINI <0.03   BNP: Invalid input(s): POCBNP CBG: No results for input(s): GLUCAP in the last 168 hours. HbA1C:  Recent Labs  09/05/17 0622  HGBA1C 5.5   Urine analysis:    Component Value Date/Time   COLORURINE STRAW (A) 09/03/2017 1714   APPEARANCEUR CLEAR 09/03/2017 1714   LABSPEC 1.004 (L) 09/03/2017 1714   PHURINE 5.0 09/03/2017 1714   GLUCOSEU NEGATIVE 09/03/2017 1714   HGBUR NEGATIVE 09/03/2017 1714   BILIRUBINUR NEGATIVE 09/03/2017 1714   KETONESUR NEGATIVE 09/03/2017 1714   PROTEINUR NEGATIVE 09/03/2017 1714   UROBILINOGEN 0.2 08/23/2015 0443   NITRITE NEGATIVE 09/03/2017 1714   LEUKOCYTESUR NEGATIVE 09/03/2017 1714   Sepsis Labs: @LABRCNTIP (procalcitonin:4,lacticidven:4) ) Recent Results (from the past 240 hour(s))  Culture, blood (Routine x 2)     Status: Abnormal (Preliminary result)   Collection Time: 09/03/17  5:20 PM  Result Value Ref Range Status   Specimen Description BLOOD LEFT FOREARM  Final   Special Requests   Final    BOTTLES DRAWN AEROBIC AND ANAEROBIC Blood Culture adequate volume   Culture  Setup Time   Final    Performed at [place] Gram Stain Report Called to,Read Back By and Verified With: AMBURN,ALISON AT 2248 ON 9.27.2018 BY ISLEY,B AEROBIC BOTTLE ONLY    Culture (A)  Final    STAPHYLOCOCCUS SPECIES (COAGULASE NEGATIVE) THE SIGNIFICANCE OF ISOLATING THIS ORGANISM FROM A SINGLE  SET OF BLOOD CULTURES WHEN MULTIPLE SETS ARE DRAWN IS UNCERTAIN. PLEASE NOTIFY THE MICROBIOLOGY DEPARTMENT WITHIN ONE WEEK IF SPECIATION AND SENSITIVITIES ARE REQUIRED. Performed at [facility], [place] [address] [place], [place] [address]    Report Status PENDING  Incomplete  Blood Culture ID Panel (Reflexed)     Status: Abnormal   Collection Time: 09/03/17  5:20 PM  Result Value Ref Range Status   Enterococcus species NOT DETECTED NOT DETECTED Final   Listeria monocytogenes NOT DETECTED NOT DETECTED Final   Staphylococcus species DETECTED (A) NOT DETECTED Final    Comment: Methicillin (oxacillin) susceptible coagulase negative staphylococcus. Possible blood culture contaminant (unless isolated from more than one blood culture draw or clinical case suggests pathogenicity). No antibiotic treatment is indicated for blood  culture contaminants. CRITICAL RESULT CALLED TO, READ BACK BY AND VERIFIED WITH: A.AMBURN RN 09/05/17 0700 L.CHAMPION    Staphylococcus aureus NOT DETECTED NOT DETECTED Final   Methicillin resistance NOT DETECTED NOT DETECTED Final   Streptococcus species NOT DETECTED NOT DETECTED Final  Streptococcus agalactiae NOT DETECTED NOT DETECTED Final   Streptococcus pneumoniae NOT DETECTED NOT DETECTED Final   Streptococcus pyogenes NOT DETECTED NOT DETECTED Final   Acinetobacter baumannii NOT DETECTED NOT DETECTED Final   Enterobacteriaceae species NOT DETECTED NOT DETECTED Final   Enterobacter cloacae complex NOT DETECTED NOT DETECTED Final   Escherichia coli NOT DETECTED NOT DETECTED Final   Klebsiella oxytoca NOT DETECTED NOT DETECTED Final   Klebsiella pneumoniae NOT DETECTED NOT DETECTED Final   Proteus species NOT DETECTED NOT DETECTED Final   Serratia marcescens NOT DETECTED NOT DETECTED Final   Haemophilus influenzae NOT DETECTED NOT DETECTED Final   Neisseria meningitidis NOT DETECTED NOT DETECTED Final   Pseudomonas aeruginosa NOT DETECTED NOT DETECTED Final     Candida albicans NOT DETECTED NOT DETECTED Final   Candida glabrata NOT DETECTED NOT DETECTED Final   Candida krusei NOT DETECTED NOT DETECTED Final   Candida parapsilosis NOT DETECTED NOT DETECTED Final   Candida tropicalis NOT DETECTED NOT DETECTED Final    Comment: Performed at [facility], [place] [address], [place], [place] [address]  Culture, blood (Routine x 2)     Status: None (Preliminary result)   Collection Time: 09/03/17  5:21 PM  Result Value Ref Range Status   Specimen Description BLOOD RIGHT FOREARM  Final   Special Requests   Final    BOTTLES DRAWN AEROBIC AND ANAEROBIC Blood Culture adequate volume   Culture NO GROWTH 3 DAYS  Final   Report Status PENDING  Incomplete  MRSA PCR Screening     Status: None   Collection Time: 09/03/17 10:50 PM  Result Value Ref Range Status   MRSA by PCR NEGATIVE NEGATIVE Final    Comment:        The GeneXpert MRSA Assay (FDA approved for NASAL specimens only), is one component of a comprehensive MRSA colonization surveillance program. It is not intended to diagnose MRSA infection nor to guide or monitor treatment for MRSA infections.   Respiratory Panel by PCR     Status: Abnormal   Collection Time: 09/04/17 10:00 AM  Result Value Ref Range Status   Adenovirus NOT DETECTED NOT DETECTED Final   Coronavirus 229E NOT DETECTED NOT DETECTED Final   Coronavirus HKU1 NOT DETECTED NOT DETECTED Final   Coronavirus NL63 NOT DETECTED NOT DETECTED Final   Coronavirus OC43 NOT DETECTED NOT DETECTED Final   Metapneumovirus NOT DETECTED NOT DETECTED Final   Rhinovirus / Enterovirus DETECTED (A) NOT DETECTED Final   Influenza A NOT DETECTED NOT DETECTED Final   Influenza B NOT DETECTED NOT DETECTED Final   Parainfluenza Virus 1 NOT DETECTED NOT DETECTED Final   Parainfluenza Virus 2 NOT DETECTED NOT DETECTED Final   Parainfluenza Virus 3 NOT DETECTED NOT DETECTED Final   Parainfluenza Virus 4 NOT DETECTED NOT DETECTED Final    Respiratory Syncytial Virus NOT DETECTED NOT DETECTED Final   Bordetella pertussis NOT DETECTED NOT DETECTED Final   Chlamydophila pneumoniae NOT DETECTED NOT DETECTED Final   Mycoplasma pneumoniae NOT DETECTED NOT DETECTED Final    Comment: Performed at [facility], [place] [address] [place], [place] [address]     Scheduled Meds: . arformoterol  15 mcg Nebulization BID  . azithromycin  500 mg Oral QHS  . budesonide (PULMICORT) nebulizer solution  0.5 mg Nebulization BID  . enoxaparin (LOVENOX) injection  40 mg Subcutaneous Q24H  . famotidine  20 mg Oral BID  . ipratropium-albuterol  3 mL Nebulization Q6H  .  loperamide  2 mg Oral Daily  . methylPREDNISolone (SOLU-MEDROL) injection  60 mg Intravenous Q8H  . tamsulosin  0.4 mg Oral Daily  . traZODone  50 mg Oral QHS   Continuous Infusions:  Procedures/Studies: Dg Chest 2 View  Result Date: 09/03/2017 CLINICAL DATA:  Cough for 1 week, shortness of breath. Generalized weakness. History of COPD. EXAM: CHEST  2 VIEW COMPARISON:  Chest radiograph December 21, 2015 FINDINGS: Habitus limited examination. The cardiac silhouette is at least mildly enlarged. Status post median sternotomy for CABG. No pleural effusion. Bibasilar strandy densities. No pneumothorax. Soft tissue planes and included osseous structures are nonsuspicious. Fractured superior sternal wires. IMPRESSION: Mild cardiomegaly. Bibasilar atelectasis/ scarring on this habitus limited examination. Electronically Signed   By: Elon Alas M.D.   On: 09/03/2017 17:50    Tika Hannis, DO  [facility] Pager [phone]  If 7PM-7AM, please contact night-coverage [email or web address] Password [email or web address] 09/06/2017, 11:41 AM   LOS: 3 days

## 2017-09-07 LAB — CULTURE, BLOOD (ROUTINE X 2): SPECIAL REQUESTS: ADEQUATE

## 2017-09-07 LAB — BASIC METABOLIC PANEL
Anion gap: 10 (ref 5–15)
BUN: 27 mg/dL — AB (ref 6–20)
CHLORIDE: 103 mmol/L (ref 101–111)
CO2: 27 mmol/L (ref 22–32)
Calcium: 8.8 mg/dL — ABNORMAL LOW (ref 8.9–10.3)
Creatinine, Ser: 0.96 mg/dL (ref 0.61–1.24)
GFR calc Af Amer: >60 mL/min (ref >60)
GFR calc non Af Amer: >60 mL/min (ref >60)
Glucose, Bld: 169 mg/dL — ABNORMAL HIGH (ref 65–99)
POTASSIUM: 4.4 mmol/L (ref 3.5–5.1)
SODIUM: 140 mmol/L (ref 135–145)

## 2017-09-07 LAB — MAGNESIUM: MAGNESIUM: 2 mg/dL (ref 1.7–2.4)

## 2017-09-07 MED ORDER — FUROSEMIDE 10 MG/ML IJ SOLN
40.0000 mg | Freq: Once | INTRAMUSCULAR | Status: AC
  Administered 2017-09-07: 40 mg via INTRAVENOUS
  Filled 2017-09-07: qty 4

## 2017-09-07 NOTE — Progress Notes (Signed)
PROGRESS NOTE  Tyler Mejia JIR:[id number] DOB: [birth year] DOA: 09/03/2017 PCP: [name], MD   Brief History: 81 year old male with a history of Alzheimer's dementia, coronary artery disease, NSTEMI, atrial fibrillation, COPD, essential hypertension, stroke in January 2017, hyperlipidemia presenting with 10 day history of shortness of breath and coughing. The patient normally resides at [facility] [place]. On the morning of 09/03/2017, the patient developed a fever with associated generalized weakness. As a result, the patient was sent to the emergency department for further evaluation. The patient was found to have fever 102.23F,WBC 13.4. Lactic acid 3.3 and 1. Chest x-ray showed chronic interstitial markings with bibasilar atelectasis. He was started on ceftriaxone and azithromycin.   Assessment/Plan: SIRS -sepsis ruled out -Lactic acid peaked at 3.31>>>1.7 -Check procalcitonin<0.10 -elevated lactic acid due to volume depletion and hypoxemia -Continue azithromycin  -Urinalysis negative for pyuria  Acute respiratory failure with hypoxia -Secondary to infectious process/COPD exacerbation -check influenza PCR-negative -check viral respiratory panel--POSITIVE Rhinovirus/Enterovirus -stable on 2L -wean for saturation >92% -9/29--repeat CXR--personally reviewed--bibasilar atelectasis, increased interstitial markings -lasix 40 mg IV x 1 -will need ambulatory pulse ox prior to d/c  COPD Exacerbation -likely due to rhinovirus/enterovirus -Continue IV solumedrol -start pulmicort--increased dose to 0.5 -continue brovana -continueDuonebs -suspect a degree of vocal cord dysfunction  Coronary Artery disease -s/p CABG 08/2015 -no angina -personally reviewed 09/04/17 EKG--sinus with nonspecific T wave changes -09/04/2088, EF 60-65%, no WMA, high LV filling pressure.  Hyperglycemia -check A1C--5.5 -likely due to steroids  Post-operative Afib -presently in  sinus  Hyperlipidemia -multiple intolerances to statins  Alzheimer's Dementia without behavioral disturbance -continue trazodone  Leg pain and edema -venous duplex--neg    Disposition Plan: [facility] 10/1 or 10/2 Family Communication: daughter updated at bedside-Total time spent 35 minutes.  Greater than 50% spent face to face counseling and coordinating care.   Consultants: none  Code Status: DNR  DVT Prophylaxis: [place] Lovenox   Procedures: As Listed in Progress Note Above  Antibiotics: Ceftriaxone 9/26>>>9/27 Azithromycin 9/27>>>    Subjective: Patient states that he is breathing about the same as yesterday but overall has improved since the time of admission. He states that his cough is improving. She denies any fevers, chills, chest pain, nausea, vomiting, diarrhea, abdominal pain, dysuria, hematuria. No rashes.  Objective: Vitals:   09/07/17 0808 09/07/17 1300 09/07/17 1413 09/07/17 1551  BP:  (!) 132/54    Pulse:  68    Resp:  20    Temp:  98 F (36.7 C)    TempSrc:  Oral    SpO2: 95% 94% 94% 95%  Weight:      Height:        Intake/Output Summary (Last 24 hours) at 09/07/17 1553 Last data filed at 09/07/17 1200  Gross per 24 hour  Intake              480 ml  Output              550 ml  Net              -70 ml   Weight change:  Exam:   General:  Pt is alert, follows commands appropriately, not in acute distress  HEENT: No icterus, No thrush, No neck mass, /AT  Cardiovascular: RRR, S1/S2, no rubs, no gallops  Respiratory: Bilateral scattered rhonchi with wheezing. Good air movement.  Abdomen: Soft/+BS, non tender, non distended, no guarding  Extremities: 1 + LE edema, No  lymphangitis, No petechiae, No rashes, no synovitis   Data Reviewed: I have personally reviewed following labs and imaging studies Basic Metabolic Panel:  Recent Labs Lab 09/03/17 1720 09/04/17 0433 09/05/17 0622 09/07/17 0544  NA 139 138  138 140  K 4.4 4.2 4.1 4.4  CL 104 106 105 103  CO2 25 23 25 27   GLUCOSE 115* 171* 118* 169*  BUN 25* 18 23* 27*  CREATININE 1.19 1.01 1.01 0.96  CALCIUM 9.3 8.4* 8.2* 8.8*  MG 1.8  --  2.0 2.0  PHOS 2.8  --   --   --    Liver Function Tests:  Recent Labs Lab 09/03/17 1720  AST 29  ALT 24  ALKPHOS 63  BILITOT 1.0  PROT 7.4  ALBUMIN 3.9   No results for input(s): LIPASE, AMYLASE in the last 168 hours. No results for input(s): AMMONIA in the last 168 hours. Coagulation Profile:  Recent Labs Lab 09/03/17 1720  INR 1.04   CBC:  Recent Labs Lab 09/03/17 1720 09/04/17 0433 09/05/17 0622 09/06/17 0544  WBC 13.4* 7.5 12.1* 10.4  NEUTROABS 11.2* 6.9 9.3* 9.5*  HGB 14.8 13.7 13.4 13.8  HCT 45.0 41.0 41.6 42.4  MCV 86.7 87.6 87.2 87.6  PLT 180 152 144* 152   Cardiac Enzymes:  Recent Labs Lab 09/03/17 1722  TROPONINI <0.03   BNP: Invalid input(s): POCBNP CBG: No results for input(s): GLUCAP in the last 168 hours. HbA1C:  Recent Labs  09/05/17 0622  HGBA1C 5.5   Urine analysis:    Component Value Date/Time   COLORURINE STRAW (A) 09/03/2017 1714   APPEARANCEUR CLEAR 09/03/2017 1714   LABSPEC 1.004 (L) 09/03/2017 1714   PHURINE 5.0 09/03/2017 1714   GLUCOSEU NEGATIVE 09/03/2017 1714   HGBUR NEGATIVE 09/03/2017 1714   BILIRUBINUR NEGATIVE 09/03/2017 1714   KETONESUR NEGATIVE 09/03/2017 1714   PROTEINUR NEGATIVE 09/03/2017 1714   UROBILINOGEN 0.2 08/23/2015 0443   NITRITE NEGATIVE 09/03/2017 1714   LEUKOCYTESUR NEGATIVE 09/03/2017 1714   Sepsis Labs: @LABRCNTIP (procalcitonin:4,lacticidven:4) ) Recent Results (from the past 240 hour(s))  Culture, blood (Routine x 2)     Status: Abnormal   Collection Time: 09/03/17  5:20 PM  Result Value Ref Range Status   Specimen Description BLOOD LEFT FOREARM  Final   Special Requests   Final    BOTTLES DRAWN AEROBIC AND ANAEROBIC Blood Culture adequate volume   Culture  Setup Time   Final    Performed at  [facility] Stain Report Called to,Read Back By and Verified With: AMBURN,ALISON AT 0263 ON 9.27.2018 BY ISLEY,B AEROBIC BOTTLE ONLY    Culture (A)  Final    STAPHYLOCOCCUS SPECIES (COAGULASE NEGATIVE) THE SIGNIFICANCE OF ISOLATING THIS ORGANISM FROM A SINGLE SET OF BLOOD CULTURES WHEN MULTIPLE SETS ARE DRAWN IS UNCERTAIN. PLEASE NOTIFY THE MICROBIOLOGY DEPARTMENT WITHIN ONE WEEK IF SPECIATION AND SENSITIVITIES ARE REQUIRED. Performed at [facility], [place] [address] [place], [place] [address]    Report Status 09/07/2017 FINAL  Final  Blood Culture ID Panel (Reflexed)     Status: Abnormal   Collection Time: 09/03/17  5:20 PM  Result Value Ref Range Status   Enterococcus species NOT DETECTED NOT DETECTED Final   Listeria monocytogenes NOT DETECTED NOT DETECTED Final   Staphylococcus species DETECTED (A) NOT DETECTED Final    Comment: Methicillin (oxacillin) susceptible coagulase negative staphylococcus. Possible blood culture contaminant (unless isolated from more than one blood culture draw or clinical case suggests  pathogenicity). No antibiotic treatment is indicated for blood  culture contaminants. CRITICAL RESULT CALLED TO, READ BACK BY AND VERIFIED WITH: A.AMBURN RN 09/05/17 0700 L.CHAMPION    Staphylococcus aureus NOT DETECTED NOT DETECTED Final   Methicillin resistance NOT DETECTED NOT DETECTED Final   Streptococcus species NOT DETECTED NOT DETECTED Final   Streptococcus agalactiae NOT DETECTED NOT DETECTED Final   Streptococcus pneumoniae NOT DETECTED NOT DETECTED Final   Streptococcus pyogenes NOT DETECTED NOT DETECTED Final   Acinetobacter baumannii NOT DETECTED NOT DETECTED Final   Enterobacteriaceae species NOT DETECTED NOT DETECTED Final   Enterobacter cloacae complex NOT DETECTED NOT DETECTED Final   Escherichia coli NOT DETECTED NOT DETECTED Final   Klebsiella oxytoca NOT DETECTED NOT DETECTED Final   Klebsiella pneumoniae NOT  DETECTED NOT DETECTED Final   Proteus species NOT DETECTED NOT DETECTED Final   Serratia marcescens NOT DETECTED NOT DETECTED Final   Haemophilus influenzae NOT DETECTED NOT DETECTED Final   Neisseria meningitidis NOT DETECTED NOT DETECTED Final   Pseudomonas aeruginosa NOT DETECTED NOT DETECTED Final   Candida albicans NOT DETECTED NOT DETECTED Final   Candida glabrata NOT DETECTED NOT DETECTED Final   Candida krusei NOT DETECTED NOT DETECTED Final   Candida parapsilosis NOT DETECTED NOT DETECTED Final   Candida tropicalis NOT DETECTED NOT DETECTED Final    Comment: Performed at [facility], [place] [address] [place], [place] [address]  Culture, blood (Routine x 2)     Status: None (Preliminary result)   Collection Time: 09/03/17  5:21 PM  Result Value Ref Range Status   Specimen Description BLOOD RIGHT FOREARM  Final   Special Requests   Final    BOTTLES DRAWN AEROBIC AND ANAEROBIC Blood Culture adequate volume   Culture NO GROWTH 4 DAYS  Final   Report Status PENDING  Incomplete  MRSA PCR Screening     Status: None   Collection Time: 09/03/17 10:50 PM  Result Value Ref Range Status   MRSA by PCR NEGATIVE NEGATIVE Final    Comment:        The GeneXpert MRSA Assay (FDA approved for NASAL specimens only), is one component of a comprehensive MRSA colonization surveillance program. It is not intended to diagnose MRSA infection nor to guide or monitor treatment for MRSA infections.   Respiratory Panel by PCR     Status: Abnormal   Collection Time: 09/04/17 10:00 AM  Result Value Ref Range Status   Adenovirus NOT DETECTED NOT DETECTED Final   Coronavirus 229E NOT DETECTED NOT DETECTED Final   Coronavirus HKU1 NOT DETECTED NOT DETECTED Final   Coronavirus NL63 NOT DETECTED NOT DETECTED Final   Coronavirus OC43 NOT DETECTED NOT DETECTED Final   Metapneumovirus NOT DETECTED NOT DETECTED Final   Rhinovirus / Enterovirus DETECTED (A) NOT DETECTED Final   Influenza A NOT  DETECTED NOT DETECTED Final   Influenza B NOT DETECTED NOT DETECTED Final   Parainfluenza Virus 1 NOT DETECTED NOT DETECTED Final   Parainfluenza Virus 2 NOT DETECTED NOT DETECTED Final   Parainfluenza Virus 3 NOT DETECTED NOT DETECTED Final   Parainfluenza Virus 4 NOT DETECTED NOT DETECTED Final   Respiratory Syncytial Virus NOT DETECTED NOT DETECTED Final   Bordetella pertussis NOT DETECTED NOT DETECTED Final   Chlamydophila pneumoniae NOT DETECTED NOT DETECTED Final   Mycoplasma pneumoniae NOT DETECTED NOT DETECTED Final    Comment: Performed at [facility], [place] [address] [place], [place] [address]     Scheduled  Meds: . arformoterol  15 mcg Nebulization BID  . azithromycin  500 mg Oral QHS  . budesonide (PULMICORT) nebulizer solution  0.5 mg Nebulization BID  . enoxaparin (LOVENOX) injection  40 mg Subcutaneous Q24H  . famotidine  20 mg Oral BID  . ipratropium-albuterol  3 mL Nebulization Q6H  . methylPREDNISolone (SOLU-MEDROL) injection  60 mg Intravenous Q8H  . tamsulosin  0.4 mg Oral Daily  . traZODone  50 mg Oral QHS   Continuous Infusions:  Procedures/Studies: Dg Chest 2 View  Result Date: 09/06/2017 CLINICAL DATA:  81 year old male with a history of respiratory failure EXAM: CHEST  2 VIEW COMPARISON:  09/03/2017 FINDINGS: Cardiomediastinal silhouette unchanged in size and contour with cardiomegaly. Surgical changes of prior median sternotomy and CABG. No evidence of central vascular congestion. Ill-defined opacity at the posterior lung base on the lateral view, similar to the comparison. No interlobular septal thickening.  No pneumothorax. IMPRESSION: Posterior lung base linear opacities, potentially atelectasis and/or consolidation. Surgical changes of prior median sternotomy and CABG. Electronically Signed   By: Corrie Mckusick D.O.   On: 09/06/2017 14:15   Dg Chest 2 View  Result Date: 09/03/2017 CLINICAL DATA:  Cough for 1 week, shortness of breath. Generalized  weakness. History of COPD. EXAM: CHEST  2 VIEW COMPARISON:  Chest radiograph December 21, 2015 FINDINGS: Habitus limited examination. The cardiac silhouette is at least mildly enlarged. Status post median sternotomy for CABG. No pleural effusion. Bibasilar strandy densities. No pneumothorax. Soft tissue planes and included osseous structures are nonsuspicious. Fractured superior sternal wires. IMPRESSION: Mild cardiomegaly. Bibasilar atelectasis/ scarring on this habitus limited examination. Electronically Signed   By: Elon Alas M.D.   On: 09/03/2017 17:50   US Venous Img Lower Bilateral  Result Date: 09/06/2017 CLINICAL DATA:  Bilateral lower extremity pain and edema for the past 2 weeks. History of smoking. Evaluate for DVT. EXAM: BILATERAL LOWER EXTREMITY VENOUS DOPPLER ULTRASOUND TECHNIQUE: Gray-scale sonography with graded compression, as well as color Doppler and duplex ultrasound were performed to evaluate the lower extremity deep venous systems from the level of the common femoral vein and including the common femoral, femoral, profunda femoral, popliteal and calf veins including the posterior tibial, peroneal and gastrocnemius veins when visible. The superficial great saphenous vein was also interrogated. Spectral Doppler was utilized to evaluate flow at rest and with distal augmentation maneuvers in the common femoral, femoral and popliteal veins. COMPARISON:  None. FINDINGS: RIGHT LOWER EXTREMITY Common Femoral Vein: No evidence of thrombus. Normal compressibility, respiratory phasicity and response to augmentation. Saphenofemoral Junction: No evidence of thrombus. Normal compressibility and flow on color Doppler imaging. Profunda Femoral Vein: No evidence of thrombus. Normal compressibility and flow on color Doppler imaging. Femoral Vein: No evidence of thrombus. Normal compressibility, respiratory phasicity and response to augmentation. Popliteal Vein: No evidence of thrombus. Normal  compressibility, respiratory phasicity and response to augmentation. Calf Veins: No evidence of thrombus. Normal compressibility and flow on color Doppler imaging. Superficial Great Saphenous Vein: No evidence of thrombus. Normal compressibility and flow on color Doppler imaging. Venous Reflux:  None. Other Findings:  None. LEFT LOWER EXTREMITY Common Femoral Vein: No evidence of thrombus. Normal compressibility, respiratory phasicity and response to augmentation. Saphenofemoral Junction: No evidence of thrombus. Normal compressibility and flow on color Doppler imaging. Profunda Femoral Vein: No evidence of thrombus. Normal compressibility and flow on color Doppler imaging. Femoral Vein: No evidence of thrombus. Normal compressibility, respiratory phasicity and response to augmentation. Popliteal Vein: No evidence of thrombus. Normal  compressibility, respiratory phasicity and response to augmentation. Calf Veins: No evidence of thrombus. Normal compressibility and flow on color Doppler imaging. Superficial Great Saphenous Vein: No evidence of thrombus. Normal compressibility and flow on color Doppler imaging. Venous Reflux:  None. Other Findings:  None. IMPRESSION: No evidence of DVT within either lower extremity. Electronically Signed   By: Sandi Mariscal M.D.   On: 09/06/2017 14:09    Trashawn Oquendo, DO  [facility] Pager [phone]  If 7PM-7AM, please contact night-coverage [email or web address] Password [email or web address] 09/07/2017, 3:53 PM   LOS: 4 days

## 2017-09-07 NOTE — Progress Notes (Signed)
Pt has had good urinary output this afternoon, however it is unmeasured due to condom catheter leaking and pt has difficulty using a urinal.

## 2017-09-08 LAB — BASIC METABOLIC PANEL
Anion gap: 10 (ref 5–15)
BUN: 34 mg/dL — AB (ref 6–20)
CO2: 28 mmol/L (ref 22–32)
Calcium: 8.6 mg/dL — ABNORMAL LOW (ref 8.9–10.3)
Chloride: 99 mmol/L — ABNORMAL LOW (ref 101–111)
Creatinine, Ser: 1.04 mg/dL (ref 0.61–1.24)
GFR calc Af Amer: >60 mL/min (ref >60)
GLUCOSE: 187 mg/dL — AB (ref 65–99)
POTASSIUM: 3.9 mmol/L (ref 3.5–5.1)
Sodium: 137 mmol/L (ref 135–145)

## 2017-09-08 LAB — CULTURE, BLOOD (ROUTINE X 2)
Culture: NO GROWTH
SPECIAL REQUESTS: ADEQUATE

## 2017-09-08 LAB — GLUCOSE, CAPILLARY: Glucose-Capillary: 253 mg/dL — ABNORMAL HIGH (ref 65–99)

## 2017-09-08 MED ORDER — RESOURCE THICKENUP CLEAR PO POWD: ORAL | 0 refills | Status: DC

## 2017-09-08 MED ORDER — PREDNISONE 20 MG PO TABS
60.0000 mg | ORAL_TABLET | Freq: Every day | ORAL | Status: DC
  Administered 2017-09-09: 60 mg via ORAL
  Filled 2017-09-08: qty 3

## 2017-09-08 MED ORDER — BUDESONIDE-FORMOTEROL FUMARATE 160-4.5 MCG/ACT IN AERO: 2.0000 | INHALATION_SPRAY | Freq: Every morning | RESPIRATORY_TRACT | 1 refills | Status: DC

## 2017-09-08 MED ORDER — FUROSEMIDE 10 MG/ML IJ SOLN
40.0000 mg | Freq: Once | INTRAMUSCULAR | Status: AC
  Administered 2017-09-08: 40 mg via INTRAVENOUS
  Filled 2017-09-08: qty 4

## 2017-09-08 MED ORDER — FAMOTIDINE 20 MG PO TABS
20.0000 mg | ORAL_TABLET | Freq: Two times a day (BID) | ORAL | 0 refills | Status: DC
Start: 2017-09-08 — End: 2018-12-06

## 2017-09-08 MED ORDER — ALBUTEROL SULFATE HFA 108 (90 BASE) MCG/ACT IN AERS: 2.0000 | INHALATION_SPRAY | Freq: Four times a day (QID) | RESPIRATORY_TRACT | 1 refills | Status: DC | PRN

## 2017-09-08 NOTE — Discharge Summary (Signed)
Physician Discharge Summary  Tyler Mejia WER:[id number] DOB: [birth year] DOA: 09/03/2017  PCP: [name], MD  Admit date: 09/03/2017 Discharge date: 09/09/2017  Admitted From: [facility] Disposition:  [facility]  Recommendations for Outpatient Follow-up:  1. Follow up with PCP in 1-2 weeks 2. Please obtain BMP/CBC in one week   Home Health: YES Equipment/Devices: HHPT  Discharge Condition: Stable CODE STATUS:DNR Diet recommendation: Heart Healthy   Brief/Interim Summary: 81 year old male with a history of Alzheimer's dementia, coronary artery disease, NSTEMI, atrial fibrillation, COPD, essential hypertension, stroke in January 2017, hyperlipidemia presenting with 10 day history of shortness of breath and coughing. The patient normally resides at [facility]. On the morning of 09/03/2017, the patient developed a fever with associated generalized weakness. As a result, the patient was sent to the emergency department for further evaluation. The patient was found to have fever 102.20F,WBC 13.4. Lactic acid 3.3 and 1. Chest x-ray showed chronic interstitial markings with bibasilar atelectasis. He was started on ceftriaxone and azithromycin. The patient was ultimately started on intravenous Solu-Medrol. He was slow to improve. He continued to have diffuse rhonchi and wheezing. He was ultimately placed on Pulmicort and Brovana. The patient was given intravenous furosemide as he appeared hypervolemic. Ultimately, he improved slowly. He'll be discharged with prednisone taper.  Discharge Diagnoses:  SIRS -sepsis ruled out -Lactic acid peaked at 3.31>>>1.7 -Check procalcitonin<0.10 -elevated lactic acid due to volume depletion and hypoxemia -Continue azithromycin--finished 5 days -Urinalysis negative for pyuria  Acute respiratory failure with hypoxia -Secondary to infectious process/COPD exacerbation -check influenza PCR-negative -check viral respiratory panel--POSITIVE  Rhinovirus/Enterovirus which is likely the inciting etiology -stable on 2L>>1L>>RA -wean for saturation >92% -9/29--repeat CXR--personally reviewed--bibasilar atelectasis, increased interstitial markings -lasix 40 mg IV x 2 during the admission -No desaturation with ambulation prior to discharge  COPD Exacerbation -likely due to rhinovirus/enterovirus -Continue IV solumedrol-->prednisone taper over 2 weeks -start pulmicort--increaseddose to 0.5 mg during the hospitalization -continuebrovana during the hospitalization -continueDuonebs -suspect a degree of vocal cord dysfunction -discharge with symbicort inhaler and albuterol rescue inhaler  Coronary Artery disease -s/p CABG 08/2015 -no angina -personally reviewed 09/04/17 EKG--sinus with nonspecific T wave changes -09/04/2088, EF 60-65%, no WMA, high LV filling pressure.  Hyperglycemia -check A1C--5.5 -likely due to steroids  Post-operative Afib -presently in sinus  Hyperlipidemia -multiple intolerances to statins  Alzheimer's Dementia without behavioral disturbance -continue trazodone  Leg pain and edema -venous duplex--neg -repeat lasix 40 mg IV x 1   Discharge Instructions  Discharge Instructions    Diet - low sodium heart healthy    Complete by:  As directed    Increase activity slowly    Complete by:  As directed      Allergies as of 09/09/2017      Reactions   Codeine Other (See Comments)   Pt does not know   Statins Other (See Comments), Nausea And Vomiting      Medication List    STOP taking these medications   loperamide 2 MG tablet Commonly known as:  IMODIUM A-D     TAKE these medications   acetaminophen 325 MG tablet Commonly known as:  TYLENOL Take 2 tablets (650 mg total) by mouth every 6 (six) hours as needed for mild pain, fever or headache. What changed:  how much to take  reasons to take this   albuterol 108 (90 Base) MCG/ACT inhaler Commonly known as:  PROVENTIL  HFA;VENTOLIN HFA Inhale 2 puffs into the lungs every 6 (six) hours as needed for wheezing  or shortness of breath.   budesonide-formoterol 160-4.5 MCG/ACT inhaler Commonly known as:  SYMBICORT Inhale 2 puffs into the lungs 2 (two) times daily.   clobetasol cream 0.05 % Commonly known as:  TEMOVATE Apply 1 application topically 2 (two) times daily as needed (irritated scalp).   COMPOUNDED TOPICALS BUILDER Apply 1 application topically 2 (two) times daily as needed (for rash). R-FLUTICASONE CR/KETOCONAZOLE CR--to underarms/groin area(s)   famotidine 20 MG tablet Commonly known as:  PEPCID Take 1 tablet (20 mg total) by mouth 2 (two) times daily.   hydrocortisone cream 1 % Apply 1 application topically 2 (two) times daily as needed for itching.   mometasone 0.1 % cream Commonly known as:  ELOCON Apply 1 application topically 2 (two) times daily as needed (skin irritation).   predniSONE 10 MG tablet Commonly known as:  DELTASONE Take 6 tablets (60 mg total) by mouth daily with breakfast. And decrease by one tablet every 2 days   RESOURCE THICKENUP CLEAR Powd Use with thin liquids to make nectar thickened consistency liquids   tamsulosin 0.4 MG Caps capsule Commonly known as:  FLOMAX Take 0.4 mg by mouth daily.   traZODone 50 MG tablet Commonly known as:  DESYREL Take 50 mg by mouth at bedtime.   triamcinolone cream 0.1 % Commonly known as:  KENALOG Apply 1 application topically 2 (two) times daily.       Allergies  Allergen Reactions  . Codeine Other (See Comments)    Pt does not know   . Statins Other (See Comments) and Nausea And Vomiting    Consultations:  none   Procedures/Studies: Dg Chest 2 View  Result Date: 09/06/2017 CLINICAL DATA:  81 year old male with a history of respiratory failure EXAM: CHEST  2 VIEW COMPARISON:  09/03/2017 FINDINGS: Cardiomediastinal silhouette unchanged in size and contour with cardiomegaly. Surgical changes of prior median  sternotomy and CABG. No evidence of central vascular congestion. Ill-defined opacity at the posterior lung base on the lateral view, similar to the comparison. No interlobular septal thickening.  No pneumothorax. IMPRESSION: Posterior lung base linear opacities, potentially atelectasis and/or consolidation. Surgical changes of prior median sternotomy and CABG. Electronically Signed   By: Corrie Mckusick D.O.   On: 09/06/2017 14:15   Dg Chest 2 View  Result Date: 09/03/2017 CLINICAL DATA:  Cough for 1 week, shortness of breath. Generalized weakness. History of COPD. EXAM: CHEST  2 VIEW COMPARISON:  Chest radiograph December 21, 2015 FINDINGS: Habitus limited examination. The cardiac silhouette is at least mildly enlarged. Status post median sternotomy for CABG. No pleural effusion. Bibasilar strandy densities. No pneumothorax. Soft tissue planes and included osseous structures are nonsuspicious. Fractured superior sternal wires. IMPRESSION: Mild cardiomegaly. Bibasilar atelectasis/ scarring on this habitus limited examination. Electronically Signed   By: Elon Alas M.D.   On: 09/03/2017 17:50   US Venous Img Lower Bilateral  Result Date: 09/06/2017 CLINICAL DATA:  Bilateral lower extremity pain and edema for the past 2 weeks. History of smoking. Evaluate for DVT. EXAM: BILATERAL LOWER EXTREMITY VENOUS DOPPLER ULTRASOUND TECHNIQUE: Gray-scale sonography with graded compression, as well as color Doppler and duplex ultrasound were performed to evaluate the lower extremity deep venous systems from the level of the common femoral vein and including the common femoral, femoral, profunda femoral, popliteal and calf veins including the posterior tibial, peroneal and gastrocnemius veins when visible. The superficial great saphenous vein was also interrogated. Spectral Doppler was utilized to evaluate flow at rest and with distal augmentation maneuvers in  the common femoral, femoral and popliteal veins. COMPARISON:   None. FINDINGS: RIGHT LOWER EXTREMITY Common Femoral Vein: No evidence of thrombus. Normal compressibility, respiratory phasicity and response to augmentation. Saphenofemoral Junction: No evidence of thrombus. Normal compressibility and flow on color Doppler imaging. Profunda Femoral Vein: No evidence of thrombus. Normal compressibility and flow on color Doppler imaging. Femoral Vein: No evidence of thrombus. Normal compressibility, respiratory phasicity and response to augmentation. Popliteal Vein: No evidence of thrombus. Normal compressibility, respiratory phasicity and response to augmentation. Calf Veins: No evidence of thrombus. Normal compressibility and flow on color Doppler imaging. Superficial Great Saphenous Vein: No evidence of thrombus. Normal compressibility and flow on color Doppler imaging. Venous Reflux:  None. Other Findings:  None. LEFT LOWER EXTREMITY Common Femoral Vein: No evidence of thrombus. Normal compressibility, respiratory phasicity and response to augmentation. Saphenofemoral Junction: No evidence of thrombus. Normal compressibility and flow on color Doppler imaging. Profunda Femoral Vein: No evidence of thrombus. Normal compressibility and flow on color Doppler imaging. Femoral Vein: No evidence of thrombus. Normal compressibility, respiratory phasicity and response to augmentation. Popliteal Vein: No evidence of thrombus. Normal compressibility, respiratory phasicity and response to augmentation. Calf Veins: No evidence of thrombus. Normal compressibility and flow on color Doppler imaging. Superficial Great Saphenous Vein: No evidence of thrombus. Normal compressibility and flow on color Doppler imaging. Venous Reflux:  None. Other Findings:  None. IMPRESSION: No evidence of DVT within either lower extremity. Electronically Signed   By: Sandi Mariscal M.D.   On: 09/06/2017 14:09        Discharge Exam: Vitals:   09/09/17 0835 09/09/17 0840  BP:    Pulse:    Resp:    Temp:      SpO2: 96% 97%   Vitals:   09/09/17 0700 09/09/17 0830 09/09/17 0835 09/09/17 0840  BP: (!) 142/81     Pulse: 78     Resp: 18     Temp: (!) 97.5 F (36.4 C)     TempSrc: Oral     SpO2: 93% 93% 96% 97%  Weight:      Height:        General: Pt is alert, awake, not in acute distress Cardiovascular: RRR, S1/S2 +, no rubs, no gallops Respiratory: CTA bilaterally, no wheezing, no rhonchi Abdominal: Soft, NT, ND, bowel sounds + Extremities: no edema, no cyanosis   The results of significant diagnostics from this hospitalization (including imaging, microbiology, ancillary and laboratory) are listed below for reference.    Significant Diagnostic Studies: Dg Chest 2 View  Result Date: 09/06/2017 CLINICAL DATA:  81 year old male with a history of respiratory failure EXAM: CHEST  2 VIEW COMPARISON:  09/03/2017 FINDINGS: Cardiomediastinal silhouette unchanged in size and contour with cardiomegaly. Surgical changes of prior median sternotomy and CABG. No evidence of central vascular congestion. Ill-defined opacity at the posterior lung base on the lateral view, similar to the comparison. No interlobular septal thickening.  No pneumothorax. IMPRESSION: Posterior lung base linear opacities, potentially atelectasis and/or consolidation. Surgical changes of prior median sternotomy and CABG. Electronically Signed   By: Corrie Mckusick D.O.   On: 09/06/2017 14:15   Dg Chest 2 View  Result Date: 09/03/2017 CLINICAL DATA:  Cough for 1 week, shortness of breath. Generalized weakness. History of COPD. EXAM: CHEST  2 VIEW COMPARISON:  Chest radiograph December 21, 2015 FINDINGS: Habitus limited examination. The cardiac silhouette is at least mildly enlarged. Status post median sternotomy for CABG. No pleural effusion. Bibasilar strandy densities. No pneumothorax. Soft  tissue planes and included osseous structures are nonsuspicious. Fractured superior sternal wires. IMPRESSION: Mild cardiomegaly. Bibasilar  atelectasis/ scarring on this habitus limited examination. Electronically Signed   By: Elon Alas M.D.   On: 09/03/2017 17:50   US Venous Img Lower Bilateral  Result Date: 09/06/2017 CLINICAL DATA:  Bilateral lower extremity pain and edema for the past 2 weeks. History of smoking. Evaluate for DVT. EXAM: BILATERAL LOWER EXTREMITY VENOUS DOPPLER ULTRASOUND TECHNIQUE: Gray-scale sonography with graded compression, as well as color Doppler and duplex ultrasound were performed to evaluate the lower extremity deep venous systems from the level of the common femoral vein and including the common femoral, femoral, profunda femoral, popliteal and calf veins including the posterior tibial, peroneal and gastrocnemius veins when visible. The superficial great saphenous vein was also interrogated. Spectral Doppler was utilized to evaluate flow at rest and with distal augmentation maneuvers in the common femoral, femoral and popliteal veins. COMPARISON:  None. FINDINGS: RIGHT LOWER EXTREMITY Common Femoral Vein: No evidence of thrombus. Normal compressibility, respiratory phasicity and response to augmentation. Saphenofemoral Junction: No evidence of thrombus. Normal compressibility and flow on color Doppler imaging. Profunda Femoral Vein: No evidence of thrombus. Normal compressibility and flow on color Doppler imaging. Femoral Vein: No evidence of thrombus. Normal compressibility, respiratory phasicity and response to augmentation. Popliteal Vein: No evidence of thrombus. Normal compressibility, respiratory phasicity and response to augmentation. Calf Veins: No evidence of thrombus. Normal compressibility and flow on color Doppler imaging. Superficial Great Saphenous Vein: No evidence of thrombus. Normal compressibility and flow on color Doppler imaging. Venous Reflux:  None. Other Findings:  None. LEFT LOWER EXTREMITY Common Femoral Vein: No evidence of thrombus. Normal compressibility, respiratory phasicity and  response to augmentation. Saphenofemoral Junction: No evidence of thrombus. Normal compressibility and flow on color Doppler imaging. Profunda Femoral Vein: No evidence of thrombus. Normal compressibility and flow on color Doppler imaging. Femoral Vein: No evidence of thrombus. Normal compressibility, respiratory phasicity and response to augmentation. Popliteal Vein: No evidence of thrombus. Normal compressibility, respiratory phasicity and response to augmentation. Calf Veins: No evidence of thrombus. Normal compressibility and flow on color Doppler imaging. Superficial Great Saphenous Vein: No evidence of thrombus. Normal compressibility and flow on color Doppler imaging. Venous Reflux:  None. Other Findings:  None. IMPRESSION: No evidence of DVT within either lower extremity. Electronically Signed   By: Sandi Mariscal M.D.   On: 09/06/2017 14:09     Microbiology: Recent Results (from the past 240 hour(s))  Culture, blood (Routine x 2)     Status: Abnormal   Collection Time: 09/03/17  5:20 PM  Result Value Ref Range Status   Specimen Description BLOOD LEFT FOREARM  Final   Special Requests   Final    BOTTLES DRAWN AEROBIC AND ANAEROBIC Blood Culture adequate volume   Culture  Setup Time   Final    Performed at [facility] Gram Stain Report Called to,Read Back By and Verified With: AMBURN,ALISON AT 6144 ON 9.27.2018 BY ISLEY,B AEROBIC BOTTLE ONLY    Culture (A)  Final    STAPHYLOCOCCUS SPECIES (COAGULASE NEGATIVE) THE SIGNIFICANCE OF ISOLATING THIS ORGANISM FROM A SINGLE SET OF BLOOD CULTURES WHEN MULTIPLE SETS ARE DRAWN IS UNCERTAIN. PLEASE NOTIFY THE MICROBIOLOGY DEPARTMENT WITHIN ONE WEEK IF SPECIATION AND SENSITIVITIES ARE REQUIRED. Performed at [facility], [place] [address] [place], [place] [address]    Report Status 09/07/2017 FINAL  Final  Blood Culture ID Panel (Reflexed)  Status: Abnormal   Collection Time: 09/03/17  5:20 PM  Result Value Ref  Range Status   Enterococcus species NOT DETECTED NOT DETECTED Final   Listeria monocytogenes NOT DETECTED NOT DETECTED Final   Staphylococcus species DETECTED (A) NOT DETECTED Final    Comment: Methicillin (oxacillin) susceptible coagulase negative staphylococcus. Possible blood culture contaminant (unless isolated from more than one blood culture draw or clinical case suggests pathogenicity). No antibiotic treatment is indicated for blood  culture contaminants. CRITICAL RESULT CALLED TO, READ BACK BY AND VERIFIED WITH: A.AMBURN RN 09/05/17 0700 L.CHAMPION    Staphylococcus aureus NOT DETECTED NOT DETECTED Final   Methicillin resistance NOT DETECTED NOT DETECTED Final   Streptococcus species NOT DETECTED NOT DETECTED Final   Streptococcus agalactiae NOT DETECTED NOT DETECTED Final   Streptococcus pneumoniae NOT DETECTED NOT DETECTED Final   Streptococcus pyogenes NOT DETECTED NOT DETECTED Final   Acinetobacter baumannii NOT DETECTED NOT DETECTED Final   Enterobacteriaceae species NOT DETECTED NOT DETECTED Final   Enterobacter cloacae complex NOT DETECTED NOT DETECTED Final   Escherichia coli NOT DETECTED NOT DETECTED Final   Klebsiella oxytoca NOT DETECTED NOT DETECTED Final   Klebsiella pneumoniae NOT DETECTED NOT DETECTED Final   Proteus species NOT DETECTED NOT DETECTED Final   Serratia marcescens NOT DETECTED NOT DETECTED Final   Haemophilus influenzae NOT DETECTED NOT DETECTED Final   Neisseria meningitidis NOT DETECTED NOT DETECTED Final   Pseudomonas aeruginosa NOT DETECTED NOT DETECTED Final   Candida albicans NOT DETECTED NOT DETECTED Final   Candida glabrata NOT DETECTED NOT DETECTED Final   Candida krusei NOT DETECTED NOT DETECTED Final   Candida parapsilosis NOT DETECTED NOT DETECTED Final   Candida tropicalis NOT DETECTED NOT DETECTED Final    Comment: Performed at [facility], [place] [address] [place], [place] [address]  Culture, blood (Routine x 2)     Status: None    Collection Time: 09/03/17  5:21 PM  Result Value Ref Range Status   Specimen Description BLOOD RIGHT FOREARM  Final   Special Requests   Final    BOTTLES DRAWN AEROBIC AND ANAEROBIC Blood Culture adequate volume   Culture NO GROWTH 5 DAYS  Final   Report Status 09/08/2017 FINAL  Final  MRSA PCR Screening     Status: None   Collection Time: 09/03/17 10:50 PM  Result Value Ref Range Status   MRSA by PCR NEGATIVE NEGATIVE Final    Comment:        The GeneXpert MRSA Assay (FDA approved for NASAL specimens only), is one component of a comprehensive MRSA colonization surveillance program. It is not intended to diagnose MRSA infection nor to guide or monitor treatment for MRSA infections.   Respiratory Panel by PCR     Status: Abnormal   Collection Time: 09/04/17 10:00 AM  Result Value Ref Range Status   Adenovirus NOT DETECTED NOT DETECTED Final   Coronavirus 229E NOT DETECTED NOT DETECTED Final   Coronavirus HKU1 NOT DETECTED NOT DETECTED Final   Coronavirus NL63 NOT DETECTED NOT DETECTED Final   Coronavirus OC43 NOT DETECTED NOT DETECTED Final   Metapneumovirus NOT DETECTED NOT DETECTED Final   Rhinovirus / Enterovirus DETECTED (A) NOT DETECTED Final   Influenza A NOT DETECTED NOT DETECTED Final   Influenza B NOT DETECTED NOT DETECTED Final   Parainfluenza Virus 1 NOT DETECTED NOT DETECTED Final   Parainfluenza Virus 2 NOT DETECTED NOT DETECTED Final   Parainfluenza Virus 3 NOT DETECTED NOT DETECTED Final  Parainfluenza Virus 4 NOT DETECTED NOT DETECTED Final   Respiratory Syncytial Virus NOT DETECTED NOT DETECTED Final   Bordetella pertussis NOT DETECTED NOT DETECTED Final   Chlamydophila pneumoniae NOT DETECTED NOT DETECTED Final   Mycoplasma pneumoniae NOT DETECTED NOT DETECTED Final    Comment: Performed at [facility], [place] [address] [place], [place] [address]     Labs: Basic Metabolic Panel:  Recent Labs Lab 09/03/17 1720 09/04/17 0998 09/05/17 0622  09/07/17 0544 09/08/17 0643 09/09/17 0432  NA 139 138 138 140 137 138  K 4.4 4.2 4.1 4.4 3.9 4.1  CL 104 106 105 103 99* 97*  CO2 25 23 25 27 28 29   GLUCOSE 115* 171* 118* 169* 187* 206*  BUN 25* 18 23* 27* 34* 44*  CREATININE 1.19 1.01 1.01 0.96 1.04 1.35*  CALCIUM 9.3 8.4* 8.2* 8.8* 8.6* 8.6*  MG 1.8  --  2.0 2.0  --  2.0  PHOS 2.8  --   --   --   --   --    Liver Function Tests:  Recent Labs Lab 09/03/17 1720  AST 29  ALT 24  ALKPHOS 63  BILITOT 1.0  PROT 7.4  ALBUMIN 3.9   No results for input(s): LIPASE, AMYLASE in the last 168 hours. No results for input(s): AMMONIA in the last 168 hours. CBC:  Recent Labs Lab 09/03/17 1720 09/04/17 0433 09/05/17 0622 09/06/17 0544  WBC 13.4* 7.5 12.1* 10.4  NEUTROABS 11.2* 6.9 9.3* 9.5*  HGB 14.8 13.7 13.4 13.8  HCT 45.0 41.0 41.6 42.4  MCV 86.7 87.6 87.2 87.6  PLT 180 152 144* 152   Cardiac Enzymes:  Recent Labs Lab 09/03/17 1722  TROPONINI <0.03   BNP: Invalid input(s): POCBNP CBG:  Recent Labs Lab 09/08/17 2144  GLUCAP 253*    Time coordinating discharge:  Greater than 30 minutes  Signed:  Aaryanna Hyden, DO [facility] Pager: [phone] 09/09/2017, 11:19 AM

## 2017-09-08 NOTE — Care Management Note (Signed)
Case Management Note  Patient Details  Name: Tyler Mejia MRN: [id number] Date of Birth: [birth year]  Subjective/Objective:                  Admitted with SIRS and resp failure. Pt is from [facility]. He is usually ambulatory and needs some assistance with ADL's. He has weaned from supplemental oxygen and PT has recommended HH PT at [place]. CM spoke with dtr, Roxanne, who requests [facility] be [facility] agency referred to.   Action/Plan: Anticipate DC back to [facility] with [facility] PT through [facility]. CM will cont to follow for DC planning.   Expected Discharge Date:     09/09/2017             Expected Discharge Plan:  Assisted Living / Rest Home  In-House Referral:  Clinical Social Work  Discharge planning Services  CM Consult  Post Acute Care Choice:  Home Health Choice offered to:  Adult Children  HH Arranged:  PT HH Agency:  [facility]  Status of Service:  In process, will continue to follow  Sherald Barge, RN 09/08/2017, 3:22 PM

## 2017-09-08 NOTE — Evaluation (Signed)
Physical Therapy Evaluation Patient Details Name: Tyler Mejia MRN: [id number] DOB: [birth year] Today's Date: 09/08/2017   History of Present Illness  LEEON Mejia is a 81 y.o. male with medical history significant of Alzheimer's dementia, CAD/CABG, non-STEMI, transient atrial fibrillation (postop from CABG in September 2016), grade 1 diastolic dysfunction, collagenous vascular disease, COPD, essential hypertension, GERD, history of CVA in January 2017, hyperlipidemia with statin intolerance, IBS, nephrolithiasis, obesity who was brought from his assisted living facility due to having a cough for about a week and fever since early this morning. The patient's son stated earlier to Dr. Dolly Rias that he has been having a cough for about 7-10 days, but since yesterday he has felt a lot weaker, developed a fever and night sweats. This morning, when he was being evaluated at the assisted living facility he was found to be febrile and eventually sent here for further evaluation. The patient currently denies headache, chest pain, sore throat, palpitations, dizziness, abdominal pain, diarrhea, constipation, melena, hematochezia dysuria or frequency.    Clinical Impression  Patient had to transfer to commode in bathroom for a bowel movement prior to gait training in hallway and limited secondary to mild SOB while on room air, O2 sats remained at 94% prior to therapy and after completing and put back on 1 LPM when put back to bed.  Patient will benefit from continued physical therapy in hospital and recommended venue below to increase strength, balance, endurance for safe ADLs and gait.    Follow Up Recommendations Home health PT;Supervision/Assistance - 24 hour    Equipment Recommendations       Recommendations for Other Services       Precautions / Restrictions Precautions Precautions: Fall Restrictions Weight Bearing Restrictions: No      Mobility  Bed Mobility Overal bed mobility: Needs  Assistance Bed Mobility: Supine to Sit;Sit to Supine     Supine to sit: Min guard Sit to supine: Min guard      Transfers Overall transfer level: Needs assistance Equipment used: Rolling walker (2 wheeled) Transfers: Sit to/from [facility] Sit to Stand: Min guard Stand pivot transfers: Min guard          Ambulation/Gait Ambulation/Gait assistance: Min guard Ambulation Distance (Feet): 40 Feet Assistive device: Rolling walker (2 wheeled) Gait Pattern/deviations: Decreased step length - right;Decreased step length - left;Decreased stride length   Gait velocity interpretation: Below normal speed for age/gender General Gait Details: demonstrates slow slightly labored cadence that becomes slower as patient fatigues  Science writer    Modified Rankin (Stroke Patients Only)       Balance Overall balance assessment: Needs assistance Sitting-balance support: No upper extremity supported;Feet supported Sitting balance-Leahy Scale: Good     Standing balance support: Bilateral upper extremity supported;During functional activity Standing balance-Leahy Scale: Fair                               Pertinent Vitals/Pain Pain Assessment: No/denies pain    Home Living Family/patient expects to be discharged to:: Assisted living               Home Equipment: Walker - 2 wheels      Prior Function Level of Independence: Needs assistance   Gait / Transfers Assistance Needed: Supervised by assisted living staff  ADL's / Homemaking Assistance Needed: assisted by staff  Hand Dominance        Extremity/Trunk Assessment   Upper Extremity Assessment Upper Extremity Assessment: Generalized weakness    Lower Extremity Assessment Lower Extremity Assessment: Generalized weakness    Cervical / Trunk Assessment Cervical / Trunk Assessment: Normal  Communication   Communication: No difficulties   Cognition Arousal/Alertness: Awake/alert Behavior During Therapy: WFL for tasks assessed/performed Overall Cognitive Status: Within Functional Limits for tasks assessed                                        General Comments      Exercises     Assessment/Plan    PT Assessment Patient needs continued PT services  PT Problem List Decreased strength;Decreased activity tolerance;Decreased balance;Decreased mobility       PT Treatment Interventions Gait training;Functional mobility training;Therapeutic activities;Therapeutic exercise    PT Goals (Current goals can be found in the Care Plan section)  Acute Rehab PT Goals Patient Stated Goal: return home PT Goal Formulation: With patient Time For Goal Achievement: 09/15/17 Potential to Achieve Goals: Good    Frequency Min 3X/week   Barriers to discharge        Co-evaluation               AM-PAC PT "6 Clicks" Daily Activity  Outcome Measure Difficulty turning over in bed (including adjusting bedclothes, sheets and blankets)?: A Little Difficulty moving from lying on back to sitting on the side of the bed? : A Little Difficulty sitting down on and standing up from a chair with arms (e.g., wheelchair, bedside commode, etc,.)?: A Little Help needed moving to and from a bed to chair (including a wheelchair)?: A Little Help needed walking in hospital room?: A Little Help needed climbing 3-5 steps with a railing? : A Little 6 Click Score: 18    End of Session Equipment Utilized During Treatment: Gait belt Activity Tolerance: Patient limited by fatigue;Patient tolerated treatment well Patient left: in bed;with call bell/phone within reach;with bed alarm set Nurse Communication: Mobility status PT Visit Diagnosis: Unsteadiness on feet (R26.81);Other abnormalities of gait and mobility (R26.89);Muscle weakness (generalized) (M62.81)    Time: 9983-3825 PT Time Calculation (min) (ACUTE ONLY): 31  min   Charges:   PT Evaluation $PT Eval Low Complexity: 1 Low PT Treatments $Therapeutic Activity: 23-37 mins   PT G Codes:        2:24 PM, 2017-09-19 Lonell Grandchild, MPT Physical Therapist with [facility] [phone] office [phone] mobile phone

## 2017-09-08 NOTE — Progress Notes (Signed)
PROGRESS NOTE  Tyler Mejia LNL:[id number] DOB: 02-24-1936 DOA: 09/03/2017 PCP: [name], MD  Brief History: 81 year old male with a history of Alzheimer's dementia, coronary artery disease, NSTEMI, atrial fibrillation, COPD, essential hypertension, stroke in January 2017, hyperlipidemia presenting with 10 day history of shortness of breath and coughing. The patient normally resides at [facility]. On the morning of 09/03/2017, the patient developed a fever with associated generalized weakness. As a result, the patient was sent to the emergency department for further evaluation. The patient was found to have fever 102.73F,WBC 13.4. Lactic acid 3.3 and 1. Chest x-ray showed chronic interstitial markings with bibasilar atelectasis. He was started on ceftriaxone and azithromycin.   Assessment/Plan: SIRS -sepsis ruled out -Lactic acid peaked at 3.31>>>1.7 -Check procalcitonin<0.10 -elevated lactic acid due to volume depletion and hypoxemia -Continue azithromycin--finished 5 days -Urinalysis negative for pyuria  Acute respiratory failure with hypoxia -Secondary to infectious process/COPD exacerbation -check influenza PCR-negative -check viral respiratory panel--POSITIVE Rhinovirus/Enterovirus -stable on 2L>>1L -wean for saturation >92% -9/29--repeat CXR--personally reviewed--bibasilar atelectasis, increased interstitial markings -lasix 40 mg IV x 1 -will need ambulatory pulse ox prior to d/c  COPD Exacerbation -likely due to rhinovirus/enterovirus -Continue IV solumedrol -start pulmicort--increased dose to 0.5 -continue brovana -continueDuonebs -suspect a degree of vocal cord dysfunction  Coronary Artery disease -s/p CABG 08/2015 -no angina -personally reviewed 09/04/17 EKG--sinus with nonspecific T wave changes -09/04/2088, EF 60-65%, no WMA, high LV filling pressure.  Hyperglycemia -check A1C--5.5 -likely due to steroids  Post-operative  Afib -presently in sinus  Hyperlipidemia -multiple intolerances to statins  Alzheimer's Dementia without behavioral disturbance -continue trazodone  Leg pain and edema -venous duplex--neg -repeat lasix 40 mg IV x 1    Disposition Plan: [facility] 10/2 of 10/3 if stable Family Communication: No family at bedside  Consultants: none  Code Status: DNR  DVT Prophylaxis: [place] Lovenox   Procedures: As Listed in Progress Note Above  Antibiotics: Ceftriaxone 9/26>>>9/27 Azithromycin 9/27>>>    Subjective: Patient says that he is breathing even better than yesterday. He denies any fevers, chills, headache, chest pain, first breath, nausea, vomiting, diarrhea, abdominal pain. No dysuria or hematuria. Denies any headache or neck pain  Objective: Vitals:   09/08/17 0618 09/08/17 0725 09/08/17 0729 09/08/17 0736  BP: (!) 163/74     Pulse: 75     Resp: 20     Temp: 98.3 F (36.8 C)     TempSrc: Oral     SpO2: 96% 97% 97% 97%  Weight:      Height:        Intake/Output Summary (Last 24 hours) at 09/08/17 1242 Last data filed at 09/08/17 0643  Gross per 24 hour  Intake              240 ml  Output              450 ml  Net             -210 ml   Weight change:  Exam:   General:  Pt is alert, follows commands appropriately, not in acute distress  HEENT: No icterus, No thrush, No neck mass, [place]/AT  Cardiovascular: RRR, S1/S2, no rubs, no gallops  Respiratory: Bibasilar rales with bibasilar wheezing. Good air movement.  Abdomen: Soft/+BS, non tender, non distended, no guarding  Extremities: 1 + LE edema, No lymphangitis, No petechiae, No rashes, no synovitis   Data Reviewed: I have personally reviewed following labs  and imaging studies Basic Metabolic Panel:  Recent Labs Lab 09/03/17 1720 09/04/17 0433 09/05/17 0622 09/07/17 0544 09/08/17 0643  NA 139 138 138 140 137  K 4.4 4.2 4.1 4.4 3.9  CL 104 106 105 103 99*  CO2 25 23 25 27 28    GLUCOSE 115* 171* 118* 169* 187*  BUN 25* 18 23* 27* 34*  CREATININE 1.19 1.01 1.01 0.96 1.04  CALCIUM 9.3 8.4* 8.2* 8.8* 8.6*  MG 1.8  --  2.0 2.0  --   PHOS 2.8  --   --   --   --    Liver Function Tests:  Recent Labs Lab 09/03/17 1720  AST 29  ALT 24  ALKPHOS 63  BILITOT 1.0  PROT 7.4  ALBUMIN 3.9   No results for input(s): LIPASE, AMYLASE in the last 168 hours. No results for input(s): AMMONIA in the last 168 hours. Coagulation Profile:  Recent Labs Lab 09/03/17 1720  INR 1.04   CBC:  Recent Labs Lab 09/03/17 1720 09/04/17 0433 09/05/17 0622 09/06/17 0544  WBC 13.4* 7.5 12.1* 10.4  NEUTROABS 11.2* 6.9 9.3* 9.5*  HGB 14.8 13.7 13.4 13.8  HCT 45.0 41.0 41.6 42.4  MCV 86.7 87.6 87.2 87.6  PLT 180 152 144* 152   Cardiac Enzymes:  Recent Labs Lab 09/03/17 1722  TROPONINI <0.03   BNP: Invalid input(s): POCBNP CBG: No results for input(s): GLUCAP in the last 168 hours. HbA1C: No results for input(s): HGBA1C in the last 72 hours. Urine analysis:    Component Value Date/Time   COLORURINE STRAW (A) 09/03/2017 1714   APPEARANCEUR CLEAR 09/03/2017 1714   LABSPEC 1.004 (L) 09/03/2017 1714   PHURINE 5.0 09/03/2017 1714   GLUCOSEU NEGATIVE 09/03/2017 1714   HGBUR NEGATIVE 09/03/2017 1714   BILIRUBINUR NEGATIVE 09/03/2017 1714   KETONESUR NEGATIVE 09/03/2017 1714   PROTEINUR NEGATIVE 09/03/2017 1714   UROBILINOGEN 0.2 08/23/2015 0443   NITRITE NEGATIVE 09/03/2017 1714   LEUKOCYTESUR NEGATIVE 09/03/2017 1714   Sepsis Labs: @LABRCNTIP (procalcitonin:4,lacticidven:4) ) Recent Results (from the past 240 hour(s))  Culture, blood (Routine x 2)     Status: Abnormal   Collection Time: 09/03/17  5:20 PM  Result Value Ref Range Status   Specimen Description BLOOD LEFT FOREARM  Final   Special Requests   Final    BOTTLES DRAWN AEROBIC AND ANAEROBIC Blood Culture adequate volume   Culture  Setup Time   Final    Performed at [facility] Stain Report Called to,Read Back By and Verified With: AMBURN,ALISON AT 8657 ON 9.27.2018 BY ISLEY,B AEROBIC BOTTLE ONLY    Culture (A)  Final    STAPHYLOCOCCUS SPECIES (COAGULASE NEGATIVE) THE SIGNIFICANCE OF ISOLATING THIS ORGANISM FROM A SINGLE SET OF BLOOD CULTURES WHEN MULTIPLE SETS ARE DRAWN IS UNCERTAIN. PLEASE NOTIFY THE MICROBIOLOGY DEPARTMENT WITHIN ONE WEEK IF SPECIATION AND SENSITIVITIES ARE REQUIRED. Performed at [facility], [place] [address], [place], [place] [address]    Report Status 09/07/2017 FINAL  Final  Blood Culture ID Panel (Reflexed)     Status: Abnormal   Collection Time: 09/03/17  5:20 PM  Result Value Ref Range Status   Enterococcus species NOT DETECTED NOT DETECTED Final   Listeria monocytogenes NOT DETECTED NOT DETECTED Final   Staphylococcus species DETECTED (A) NOT DETECTED Final    Comment: Methicillin (oxacillin) susceptible coagulase negative staphylococcus. Possible blood culture contaminant (unless isolated from more than one blood culture draw or clinical case suggests pathogenicity). No  antibiotic treatment is indicated for blood  culture contaminants. CRITICAL RESULT CALLED TO, READ BACK BY AND VERIFIED WITH: A.AMBURN RN 09/05/17 0700 L.CHAMPION    Staphylococcus aureus NOT DETECTED NOT DETECTED Final   Methicillin resistance NOT DETECTED NOT DETECTED Final   Streptococcus species NOT DETECTED NOT DETECTED Final   Streptococcus agalactiae NOT DETECTED NOT DETECTED Final   Streptococcus pneumoniae NOT DETECTED NOT DETECTED Final   Streptococcus pyogenes NOT DETECTED NOT DETECTED Final   Acinetobacter baumannii NOT DETECTED NOT DETECTED Final   Enterobacteriaceae species NOT DETECTED NOT DETECTED Final   Enterobacter cloacae complex NOT DETECTED NOT DETECTED Final   Escherichia coli NOT DETECTED NOT DETECTED Final   Klebsiella oxytoca NOT DETECTED NOT DETECTED Final   Klebsiella pneumoniae NOT DETECTED NOT DETECTED Final    Proteus species NOT DETECTED NOT DETECTED Final   Serratia marcescens NOT DETECTED NOT DETECTED Final   Haemophilus influenzae NOT DETECTED NOT DETECTED Final   Neisseria meningitidis NOT DETECTED NOT DETECTED Final   Pseudomonas aeruginosa NOT DETECTED NOT DETECTED Final   Candida albicans NOT DETECTED NOT DETECTED Final   Candida glabrata NOT DETECTED NOT DETECTED Final   Candida krusei NOT DETECTED NOT DETECTED Final   Candida parapsilosis NOT DETECTED NOT DETECTED Final   Candida tropicalis NOT DETECTED NOT DETECTED Final    Comment: Performed at [facility], [place] [address], [place], [place] [address]  Culture, blood (Routine x 2)     Status: None   Collection Time: 09/03/17  5:21 PM  Result Value Ref Range Status   Specimen Description BLOOD RIGHT FOREARM  Final   Special Requests   Final    BOTTLES DRAWN AEROBIC AND ANAEROBIC Blood Culture adequate volume   Culture NO GROWTH 5 DAYS  Final   Report Status 09/08/2017 FINAL  Final  MRSA PCR Screening     Status: None   Collection Time: 09/03/17 10:50 PM  Result Value Ref Range Status   MRSA by PCR NEGATIVE NEGATIVE Final    Comment:        The GeneXpert MRSA Assay (FDA approved for NASAL specimens only), is one component of a comprehensive MRSA colonization surveillance program. It is not intended to diagnose MRSA infection nor to guide or monitor treatment for MRSA infections.   Respiratory Panel by PCR     Status: Abnormal   Collection Time: 09/04/17 10:00 AM  Result Value Ref Range Status   Adenovirus NOT DETECTED NOT DETECTED Final   Coronavirus 229E NOT DETECTED NOT DETECTED Final   Coronavirus HKU1 NOT DETECTED NOT DETECTED Final   Coronavirus NL63 NOT DETECTED NOT DETECTED Final   Coronavirus OC43 NOT DETECTED NOT DETECTED Final   Metapneumovirus NOT DETECTED NOT DETECTED Final   Rhinovirus / Enterovirus DETECTED (A) NOT DETECTED Final   Influenza A NOT DETECTED NOT DETECTED Final   Influenza B NOT  DETECTED NOT DETECTED Final   Parainfluenza Virus 1 NOT DETECTED NOT DETECTED Final   Parainfluenza Virus 2 NOT DETECTED NOT DETECTED Final   Parainfluenza Virus 3 NOT DETECTED NOT DETECTED Final   Parainfluenza Virus 4 NOT DETECTED NOT DETECTED Final   Respiratory Syncytial Virus NOT DETECTED NOT DETECTED Final   Bordetella pertussis NOT DETECTED NOT DETECTED Final   Chlamydophila pneumoniae NOT DETECTED NOT DETECTED Final   Mycoplasma pneumoniae NOT DETECTED NOT DETECTED Final    Comment: Performed at [facility], [place] [address] [place], [place] [address]     Scheduled Meds: . arformoterol  15 mcg Nebulization BID  . azithromycin  500 mg Oral QHS  . budesonide (PULMICORT) nebulizer solution  0.5 mg Nebulization BID  . enoxaparin (LOVENOX) injection  40 mg Subcutaneous Q24H  . famotidine  20 mg Oral BID  . furosemide  40 mg Intravenous Once  . ipratropium-albuterol  3 mL Nebulization Q6H  . methylPREDNISolone (SOLU-MEDROL) injection  60 mg Intravenous Q8H  . tamsulosin  0.4 mg Oral Daily  . traZODone  50 mg Oral QHS   Continuous Infusions:  Procedures/Studies: Dg Chest 2 View  Result Date: 09/06/2017 CLINICAL DATA:  81 year old male with a history of respiratory failure EXAM: CHEST  2 VIEW COMPARISON:  09/03/2017 FINDINGS: Cardiomediastinal silhouette unchanged in size and contour with cardiomegaly. Surgical changes of prior median sternotomy and CABG. No evidence of central vascular congestion. Ill-defined opacity at the posterior lung base on the lateral view, similar to the comparison. No interlobular septal thickening.  No pneumothorax. IMPRESSION: Posterior lung base linear opacities, potentially atelectasis and/or consolidation. Surgical changes of prior median sternotomy and CABG. Electronically Signed   By: Corrie Mckusick D.O.   On: 09/06/2017 14:15   Dg Chest 2 View  Result Date: 09/03/2017 CLINICAL DATA:  Cough for 1 week, shortness of breath. Generalized weakness.  History of COPD. EXAM: CHEST  2 VIEW COMPARISON:  Chest radiograph December 21, 2015 FINDINGS: Habitus limited examination. The cardiac silhouette is at least mildly enlarged. Status post median sternotomy for CABG. No pleural effusion. Bibasilar strandy densities. No pneumothorax. Soft tissue planes and included osseous structures are nonsuspicious. Fractured superior sternal wires. IMPRESSION: Mild cardiomegaly. Bibasilar atelectasis/ scarring on this habitus limited examination. Electronically Signed   By: Elon Alas M.D.   On: 09/03/2017 17:50   US Venous Img Lower Bilateral  Result Date: 09/06/2017 CLINICAL DATA:  Bilateral lower extremity pain and edema for the past 2 weeks. History of smoking. Evaluate for DVT. EXAM: BILATERAL LOWER EXTREMITY VENOUS DOPPLER ULTRASOUND TECHNIQUE: Gray-scale sonography with graded compression, as well as color Doppler and duplex ultrasound were performed to evaluate the lower extremity deep venous systems from the level of the common femoral vein and including the common femoral, femoral, profunda femoral, popliteal and calf veins including the posterior tibial, peroneal and gastrocnemius veins when visible. The superficial great saphenous vein was also interrogated. Spectral Doppler was utilized to evaluate flow at rest and with distal augmentation maneuvers in the common femoral, femoral and popliteal veins. COMPARISON:  None. FINDINGS: RIGHT LOWER EXTREMITY Common Femoral Vein: No evidence of thrombus. Normal compressibility, respiratory phasicity and response to augmentation. Saphenofemoral Junction: No evidence of thrombus. Normal compressibility and flow on color Doppler imaging. Profunda Femoral Vein: No evidence of thrombus. Normal compressibility and flow on color Doppler imaging. Femoral Vein: No evidence of thrombus. Normal compressibility, respiratory phasicity and response to augmentation. Popliteal Vein: No evidence of thrombus. Normal compressibility,  respiratory phasicity and response to augmentation. Calf Veins: No evidence of thrombus. Normal compressibility and flow on color Doppler imaging. Superficial Great Saphenous Vein: No evidence of thrombus. Normal compressibility and flow on color Doppler imaging. Venous Reflux:  None. Other Findings:  None. LEFT LOWER EXTREMITY Common Femoral Vein: No evidence of thrombus. Normal compressibility, respiratory phasicity and response to augmentation. Saphenofemoral Junction: No evidence of thrombus. Normal compressibility and flow on color Doppler imaging. Profunda Femoral Vein: No evidence of thrombus. Normal compressibility and flow on color Doppler imaging. Femoral Vein: No evidence of thrombus. Normal compressibility, respiratory phasicity and response to augmentation. Popliteal Vein: No  evidence of thrombus. Normal compressibility, respiratory phasicity and response to augmentation. Calf Veins: No evidence of thrombus. Normal compressibility and flow on color Doppler imaging. Superficial Great Saphenous Vein: No evidence of thrombus. Normal compressibility and flow on color Doppler imaging. Venous Reflux:  None. Other Findings:  None. IMPRESSION: No evidence of DVT within either lower extremity. Electronically Signed   By: Sandi Mariscal M.D.   On: 09/06/2017 14:09    Charisa Twitty, DO  [facility] Pager [phone]  If 7PM-7AM, please contact night-coverage [email or web address] Password [email or web address] 09/08/2017, 12:42 PM   LOS: 5 days

## 2017-09-09 LAB — BASIC METABOLIC PANEL
Anion gap: 12 (ref 5–15)
BUN: 44 mg/dL — AB (ref 6–20)
CALCIUM: 8.6 mg/dL — AB (ref 8.9–10.3)
CO2: 29 mmol/L (ref 22–32)
CREATININE: 1.35 mg/dL — AB (ref 0.61–1.24)
Chloride: 97 mmol/L — ABNORMAL LOW (ref 101–111)
GFR calc Af Amer: 56 mL/min — ABNORMAL LOW (ref >60)
GFR, EST NON AFRICAN AMERICAN: 48 mL/min — AB (ref >60)
GLUCOSE: 206 mg/dL — AB (ref 65–99)
Potassium: 4.1 mmol/L (ref 3.5–5.1)
SODIUM: 138 mmol/L (ref 135–145)

## 2017-09-09 LAB — MAGNESIUM: MAGNESIUM: 2 mg/dL (ref 1.7–2.4)

## 2017-09-09 MED ORDER — ENOXAPARIN SODIUM 60 MG/0.6ML ~~LOC~~ SOLN: 50.0000 mg | SUBCUTANEOUS | Status: DC

## 2017-09-09 MED ORDER — PREDNISONE 10 MG PO TABS: 60.0000 mg | ORAL_TABLET | Freq: Every day | ORAL | 0 refills | Status: DC

## 2017-09-09 MED ORDER — BUDESONIDE-FORMOTEROL FUMARATE 160-4.5 MCG/ACT IN AERO: 2.0000 | INHALATION_SPRAY | Freq: Two times a day (BID) | RESPIRATORY_TRACT | 1 refills | Status: DC

## 2017-09-09 NOTE — NC FL2 (Deleted)
[place] LEVEL OF CARE SCREENING TOOL     IDENTIFICATION  Patient Name: Tyler Mejia Birthdate: [birth year] Sex: male Admission Date (Current Location): 09/03/2017  [facility] and [place] Number:  [employer] and Address:  [facility] [address], [place]      Provider Number: [id number]  Attending Physician Name and Address:  Orson Eva, MD  Relative Name and Phone Number:  Marya Amsler, son, [phone]    Current Level of Care: Hospital Recommended Level of Care: Memory Care Prior Approval Number:    Date Approved/Denied:   PASRR Number:    Discharge Plan: Other (Comment) (Memory Care)    Current Diagnoses: Patient Active Problem List   Diagnosis Date Noted  . Sepsis due to undetermined organism ([place]) 09/04/2017  . Acute respiratory failure with hypoxia ([place]) 09/04/2017  . COPD with acute exacerbation ([place]) 09/04/2017  . Sepsis due to pneumonia ([place]) 09/03/2017  . Abnormal ECG 09/03/2017  . CAP (community acquired pneumonia) 09/03/2017  . Dementia 05/21/2016  . Coronary artery disease involving coronary bypass graft of native heart with unspecified angina pectoris 02/29/2016  . Cerebrovascular accident (CVA) due to bilateral thrombosis of middle cerebral arteries ([place]) 02/29/2016  . CVA (cerebral infarction) 12/21/2015  . Altered mental status 12/21/2015  . Ileus, postoperative ([place]) 08/23/2015  . S/P CABG x 4 08/11/2015  . Obesity 08/10/2015  . Accelerating angina ([place]) 08/09/2015  . Mucosal abnormality of esophagus   . Hiatal hernia   . Dysphagia 03/15/2015  . Schatzki's ring 03/15/2015  . COPD (chronic obstructive pulmonary disease) ([place]) 08/21/2012  . Coronary artery disease involving native coronary artery 05/20/2012  . HLD (hyperlipidemia) 04/10/2012  . Shortness of breath 02/28/2012  . Essential hypertension 02/28/2012    Orientation RESPIRATION BLADDER Height & Weight     Place, Self, Situation  O2  (Nasal cannula 3L) Incontinent, External catheter Weight: 248 lb 9.6 oz (112.8 kg) Height:  5\' 9"  (175.3 cm)  BEHAVIORAL SYMPTOMS/MOOD NEUROLOGICAL BOWEL NUTRITION STATUS      Continent Diet (Diet DYS 3. Fluid Consistency: Nectar Thick. Full supervision, small bites/sips, 1 sip at a time, sit fully upright)  AMBULATORY STATUS COMMUNICATION OF NEEDS Skin   Limited Assist Verbally Normal                       Personal Care Assistance Level of Assistance  Bathing, Feeding, Dressing Bathing Assistance: Limited assistance Feeding assistance: Limited assistance Dressing Assistance: Limited assistance     Functional Limitations Info  Hearing, Sight Sight Info: Impaired Hearing Info: Impaired      SPECIAL CARE FACTORS FREQUENCY  PT (By licensed PT)     PT Frequency: 3x/week              Contractures      Additional Factors Info  Code Status, Allergies Code Status Info: DNR Allergies Info: Codeine, Statins           Current Medications (09/09/2017):  This is the current hospital active medication list Current Facility-Administered Medications  Medication Dose Route Frequency Provider Last Rate Last Dose  . acetaminophen (TYLENOL) tablet 650 mg  650 mg Oral Q6H PRN Reubin Milan, MD   650 mg at 09/04/17 2778   Or  . acetaminophen (TYLENOL) suppository 650 mg  650 mg Rectal Q6H PRN Reubin Milan, MD      . albuterol (PROVENTIL) (2.5 MG/3ML) 0.083% nebulizer solution 2.5 mg  2.5 mg Nebulization Q6H  PRN Reubin Milan, MD      . arformoterol [facility]) nebulizer solution 15 mcg  15 mcg Nebulization BID Orson Eva, MD   15 mcg at 09/09/17 0840  . budesonide (PULMICORT) nebulizer solution 0.5 mg  0.5 mg Nebulization BID Tat, David, MD   0.5 mg at 09/09/17 0834  . clobetasol ointment (TEMOVATE) 0.05 %   Topical BID PRN Tat, David, MD      . enoxaparin (LOVENOX) injection 50 mg  50 mg Subcutaneous Q24H Tat, David, MD      . famotidine (PEPCID) tablet 20 mg  20  mg Oral BID Reubin Milan, MD   20 mg at 09/09/17 0920  . ipratropium (ATROVENT) nebulizer solution 0.5 mg  0.5 mg Nebulization Q6H PRN Reubin Milan, MD      . ipratropium-albuterol (DUONEB) 0.5-2.5 (3) MG/3ML nebulizer solution 3 mL  3 mL Nebulization Q6H Orson Eva, MD   3 mL at 09/09/17 8119  . menthol-cetylpyridinium (CEPACOL) lozenge 3 mg  1 lozenge Oral PRN Tat, Shanon Brow, MD      . ondansetron [facility]) tablet 4 mg  4 mg Oral Q6H PRN Reubin Milan, MD       Or  . ondansetron [facility]) injection 4 mg  4 mg Intravenous Q6H PRN Reubin Milan, MD      . predniSONE (DELTASONE) tablet 60 mg  60 mg Oral Q breakfast Tat, Shanon Brow, MD   60 mg at 09/09/17 1478  . [place]   Oral PRN Tat, David, MD      . tamsulosin [facility]) capsule 0.4 mg  0.4 mg Oral Daily Reubin Milan, MD   0.4 mg at 09/09/17 0920  . traZODone (DESYREL) tablet 50 mg  50 mg Oral QHS Reubin Milan, MD   50 mg at 09/08/17 2126     Discharge Medications: TAKE these medications   acetaminophen 325 MG tablet Commonly known as:  TYLENOL Take 2 tablets (650 mg total) by mouth every 6 (six) hours as needed for mild pain, fever or headache. What changed:  how much to take  reasons to take this   albuterol 108 (90 Base) MCG/ACT inhaler Commonly known as:  PROVENTIL HFA;VENTOLIN HFA Inhale 2 puffs into the lungs every 6 (six) hours as needed for wheezing or shortness of breath.   budesonide-formoterol 160-4.5 MCG/ACT inhaler Commonly known as:  SYMBICORT Inhale 2 puffs into the lungs 2 (two) times daily.   clobetasol cream 0.05 % Commonly known as:  TEMOVATE Apply 1 application topically 2 (two) times daily as needed (irritated scalp).   COMPOUNDED TOPICALS BUILDER Apply 1 application topically 2 (two) times daily as needed (for rash). R-FLUTICASONE CR/KETOCONAZOLE CR--to underarms/groin area(s)   famotidine 20 MG tablet Commonly known as:  PEPCID Take 1 tablet (20 mg  total) by mouth 2 (two) times daily.   hydrocortisone cream 1 % Apply 1 application topically 2 (two) times daily as needed for itching.   mometasone 0.1 % cream Commonly known as:  ELOCON Apply 1 application topically 2 (two) times daily as needed (skin irritation).   predniSONE 10 MG tablet Commonly known as:  DELTASONE Take 6 tablets (60 mg total) by mouth daily with breakfast. And decrease by one tablet every 2 days   RESOURCE THICKENUP CLEAR Powd Use with thin liquids to make nectar thickened consistency liquids   tamsulosin 0.4 MG Caps capsule Commonly known as:  FLOMAX Take 0.4 mg by mouth daily.   traZODone 50 MG tablet  Commonly known as:  DESYREL Take 50 mg by mouth at bedtime.   triamcinolone cream 0.1 % Commonly known as:  KENALOG Apply 1 application topically 2 (two) times daily.   .  Relevant Imaging Results:  Relevant Lab Results:   Additional Information    Taccara Bushnell, Clydene Pugh, LCSW

## 2017-09-09 NOTE — Progress Notes (Signed)
Physical Therapy Treatment Patient Details Name: Tyler Mejia MRN: [id number] DOB: [birth year] Today's Date: 09/09/2017    History of Present Illness Tyler Mejia is a 81 y.o. male with medical history significant of Alzheimer's dementia, CAD/CABG, non-STEMI, transient atrial fibrillation (postop from CABG in September 2016), grade 1 diastolic dysfunction, collagenous vascular disease, COPD, essential hypertension, GERD, history of CVA in January 2017, hyperlipidemia with statin intolerance, IBS, nephrolithiasis, obesity who was brought from his assisted living facility due to having a cough for about a week and fever since early this morning. The patient's son stated earlier to Dr. Dolly Rias that he has been having a cough for about 7-10 days, but since yesterday he has felt a lot weaker, developed a fever and night sweats. This morning, when he was being evaluated at the assisted living facility he was found to be febrile and eventually sent here for further evaluation. The patient currently denies headache, chest pain, sore throat, palpitations, dizziness, abdominal pain, diarrhea, constipation, melena, hematochezia dysuria or frequency.    PT Comments    Patient had to transfer to commode in bathroom to have bowel movement demonstrating very unsteady gait with near loss of balance when not using RW, able to ambulate slightly farther today and on room air throughout treatment with O2 Sats between 89-92 %.  Patient's O2 sat a 94% when put back to bed on room air.  Patient will benefit from continued physical therapy in hospital and recommended venue below to increase strength, balance, endurance for safe ADLs and gait.   Follow Up Recommendations  Home health PT;Supervision/Assistance - 24 hour     Equipment Recommendations  None recommended by PT    Recommendations for Other Services       Precautions / Restrictions Precautions Precautions: Fall Restrictions Weight Bearing Restrictions:  No    Mobility  Bed Mobility Overal bed mobility: Needs Assistance Bed Mobility: Supine to Sit;Sit to Supine     Supine to sit: Min guard Sit to supine: Min guard      Transfers Overall transfer level: Needs assistance Equipment used: Rolling walker (2 wheeled) Transfers: Sit to/from [facility] Sit to Stand: Min guard Stand pivot transfers: Min guard          Ambulation/Gait Ambulation/Gait assistance: Min guard Ambulation Distance (Feet): 50 Feet Assistive device: Rolling walker (2 wheeled) Gait Pattern/deviations: Decreased step length - right;Decreased step length - left;Decreased stride length   Gait velocity interpretation: Below normal speed for age/gender General Gait Details: very unsteady on feet when not using a walker, demonstrates slow slightly labored cadence without loss of balance using FWW   Stairs            Wheelchair Mobility    Modified Rankin (Stroke Patients Only)       Balance Overall balance assessment: Needs assistance Sitting-balance support: No upper extremity supported;Feet supported Sitting balance-Leahy Scale: Good     Standing balance support: Bilateral upper extremity supported;During functional activity Standing balance-Leahy Scale: Fair                              Cognition Arousal/Alertness: Awake/alert Behavior During Therapy: WFL for tasks assessed/performed Overall Cognitive Status: Within Functional Limits for tasks assessed  Exercises General Exercises - Lower Extremity Ankle Circles/Pumps: Seated;AROM;Both;10 reps    General Comments        Pertinent Vitals/Pain Pain Assessment: Faces Faces Pain Scale: Hurts a little bit Pain Location: stomach Pain Descriptors / Indicators: Aching Pain Intervention(s): Limited activity within patient's tolerance;Monitored during session    Home Living                       Prior Function            PT Goals (current goals can now be found in the care plan section) Acute Rehab PT Goals Patient Stated Goal: return home PT Goal Formulation: With patient Time For Goal Achievement: 09/15/17 Potential to Achieve Goals: Good Progress towards PT goals: Progressing toward goals    Frequency    Min 3X/week      PT Plan Current plan remains appropriate    Co-evaluation              AM-PAC PT "6 Clicks" Daily Activity  Outcome Measure  Difficulty turning over in bed (including adjusting bedclothes, sheets and blankets)?: A Little Difficulty moving from lying on back to sitting on the side of the bed? : A Little Difficulty sitting down on and standing up from a chair with arms (e.g., wheelchair, bedside commode, etc,.)?: A Little Help needed moving to and from a bed to chair (including a wheelchair)?: A Little Help needed walking in hospital room?: A Little Help needed climbing 3-5 steps with a railing? : A Little 6 Click Score: 18    End of Session Equipment Utilized During Treatment: Gait belt Activity Tolerance: Patient tolerated treatment well;Patient limited by fatigue Patient left: in bed;with call bell/phone within reach;with bed alarm set Nurse Communication: Mobility status PT Visit Diagnosis: Unsteadiness on feet (R26.81);Other abnormalities of gait and mobility (R26.89);Muscle weakness (generalized) (M62.81)     Time: 1856-3149 PT Time Calculation (min) (ACUTE ONLY): 36 min  Charges:  $Therapeutic Activity: 23-37 mins                    G Codes:       1:25 PM, 2017/10/01 Lonell Grandchild, MPT Physical Therapist with [facility] [phone] office [phone] mobile phone

## 2017-09-09 NOTE — Care Management Important Message (Signed)
Important Message  Patient Details  Name: Tyler Mejia MRN: [id number] Date of Birth: 04/05/1936   Medicare Important Message Given:  Yes    Sherald Barge, RN 09/09/2017, 11:32 AM

## 2017-09-09 NOTE — Care Management Note (Signed)
Case Management Note  Patient Details  Name: Tyler Mejia MRN: [id number] Date of Birth: December 25, 1935  Expected Discharge Date:  09/09/17               Expected Discharge Plan:  Assisted Living / Rest Home  In-House Referral:  Clinical Social Work  Discharge planning Services  CM Consult  Post Acute Care Choice:  Home Health Choice offered to:  Adult Children  HH Arranged:  PT HH Agency:  [facility]  Status of Service:  Completed, signed off  If discussed at [place] of Stay Meetings, dates discussed:  09/09/2017 Additional Comments: Discharging back to ALF today. CSW has made arrangements. Will get [facility] PT through [facility]. Vaughan Basta, [facility] rep, aware of referral, will pull pt info from [facility]. Pt's dtr and facility aware that [facility] has 48 hrs to make first visit.   Sherald Barge, RN 09/09/2017, 11:30 AM

## 2017-09-09 NOTE — Clinical Social Work Note (Signed)
LCSW notified son, Marya Amsler, and Alyse Low (med Designer, multimedia) at [employer] of patient's discharge. Alyse Low advised that they would pick patient up at 3:00. Clinicals were faxed via [employer].   LCSW signing off.     Jaela Yepez, Clydene Pugh, LCSW

## 2017-09-09 NOTE — Progress Notes (Signed)
Tyler Mejia discharged to [facility] per MD order.  Discharge instructions reviewed and discussed with the patient, all questions and concerns answered. Copy of instructions and scripts given to patient. Report called to [place] at [place].  Allergies as of 09/09/2017      Reactions   Codeine Other (See Comments)   Pt does not know   Statins Other (See Comments), Nausea And Vomiting      Medication List    STOP taking these medications   loperamide 2 MG tablet Commonly known as:  IMODIUM A-D     TAKE these medications   acetaminophen 325 MG tablet Commonly known as:  TYLENOL Take 2 tablets (650 mg total) by mouth every 6 (six) hours as needed for mild pain, fever or headache. What changed:  how much to take  reasons to take this   albuterol 108 (90 Base) MCG/ACT inhaler Commonly known as:  PROVENTIL HFA;VENTOLIN HFA Inhale 2 puffs into the lungs every 6 (six) hours as needed for wheezing or shortness of breath.   budesonide-formoterol 160-4.5 MCG/ACT inhaler Commonly known as:  SYMBICORT Inhale 2 puffs into the lungs 2 (two) times daily.   clobetasol cream 0.05 % Commonly known as:  TEMOVATE Apply 1 application topically 2 (two) times daily as needed (irritated scalp).   COMPOUNDED TOPICALS BUILDER Apply 1 application topically 2 (two) times daily as needed (for rash). R-FLUTICASONE CR/KETOCONAZOLE CR--to underarms/groin area(s)   famotidine 20 MG tablet Commonly known as:  PEPCID Take 1 tablet (20 mg total) by mouth 2 (two) times daily.   hydrocortisone cream 1 % Apply 1 application topically 2 (two) times daily as needed for itching.   mometasone 0.1 % cream Commonly known as:  ELOCON Apply 1 application topically 2 (two) times daily as needed (skin irritation).   predniSONE 10 MG tablet Commonly known as:  DELTASONE Take 6 tablets (60 mg total) by mouth daily with breakfast. And decrease by one tablet every 2 days   RESOURCE THICKENUP CLEAR  Powd Use with thin liquids to make nectar thickened consistency liquids   tamsulosin 0.4 MG Caps capsule Commonly known as:  FLOMAX Take 0.4 mg by mouth daily.   traZODone 50 MG tablet Commonly known as:  DESYREL Take 50 mg by mouth at bedtime.   triamcinolone cream 0.1 % Commonly known as:  KENALOG Apply 1 application topically 2 (two) times daily.        IV site discontinued and catheter remains intact. Site without signs and symptoms of complications. Dressing and pressure applied.  Patient transported to [facility] per their staff,  no distress noted upon discharge.  Tyler Mejia 09/09/2017 1:39 PM

## 2017-09-09 NOTE — Progress Notes (Signed)
Inpatient Diabetes Program Recommendations  AACE/ADA: New Consensus Statement on Inpatient Glycemic Control (2015)  Target Ranges:  Prepandial:   less than 140 mg/dL      Peak postprandial:   less than 180 mg/dL (1-2 hours)      Critically ill patients:  140 - 180 mg/dL   Results for NICOLO, TOMKO (MRN [id number]) as of 09/09/2017 09:10  Ref. Range 09/05/2017 06:22 09/07/2017 05:44 09/08/2017 06:43 09/09/2017 04:32  Glucose Latest Ref Range: 65 - 99 mg/dL 118 (H) 169 (H) 187 (H) 206 (H)  Hemoglobin A1C Latest Ref Range: 4.8 - 5.6 % 5.5       Review of Glycemic Control  Diabetes history: No Outpatient Diabetes medications: NA Current orders for Inpatient glycemic control: None  Inpatient Diabetes Program Recommendations: Correction (SSI): While inpatient and ordered steroids, please consider ordering CBGs with Novolog correction scale ACHS.  Thanks, Barnie Alderman, RN, MSN, CDE Diabetes Coordinator Inpatient Diabetes Program [phone] (Team Pager from 8am to 5pm)

## 2017-09-09 NOTE — NC FL2 (Signed)
[place] LEVEL OF CARE SCREENING TOOL     IDENTIFICATION  Patient Name: Tyler Mejia Birthdate: 07/25/1936 Sex: male Admission Date (Current Location): 09/03/2017  [facility] and [place] Number:  [employer] and Address:  [place] [address] [place]      Provider Number: [id number]  Attending Physician Name and Address:  Orson Eva, MD  Relative Name and Phone Number:  Marya Amsler, son, [phone]    Current Level of Care: Hospital Recommended Level of Care: Memory Care Prior Approval Number:    Date Approved/Denied:   PASRR Number:    Discharge Plan: Other (Comment) (Memory Care)    Current Diagnoses: Patient Active Problem List   Diagnosis Date Noted  . Sepsis due to undetermined organism ([place]) 09/04/2017  . Acute respiratory failure with hypoxia ([place]) 09/04/2017  . COPD with acute exacerbation ([place]) 09/04/2017  . Sepsis due to pneumonia ([place]) 09/03/2017  . Abnormal ECG 09/03/2017  . CAP (community acquired pneumonia) 09/03/2017  . Dementia 05/21/2016  . Coronary artery disease involving coronary bypass graft of native heart with unspecified angina pectoris 02/29/2016  . Cerebrovascular accident (CVA) due to bilateral thrombosis of middle cerebral arteries ([place]) 02/29/2016  . CVA (cerebral infarction) 12/21/2015  . Altered mental status 12/21/2015  . Ileus, postoperative ([place]) 08/23/2015  . S/P CABG x 4 08/11/2015  . Obesity 08/10/2015  . Accelerating angina ([place]) 08/09/2015  . Mucosal abnormality of esophagus   . Hiatal hernia   . Dysphagia 03/15/2015  . Schatzki's ring 03/15/2015  . COPD (chronic obstructive pulmonary disease) ([place]) 08/21/2012  . Coronary artery disease involving native coronary artery 05/20/2012  . HLD (hyperlipidemia) 04/10/2012  . Shortness of breath 02/28/2012  . Essential hypertension 02/28/2012    Orientation RESPIRATION BLADDER Height & Weight     Place, Self, Situation  O2  (Nasal cannula 3L) Incontinent, External catheter Weight: 248 lb 9.6 oz (112.8 kg) Height:  5\' 9"  (175.3 cm)  BEHAVIORAL SYMPTOMS/MOOD NEUROLOGICAL BOWEL NUTRITION STATUS      Continent Diet (Diet DYS 3. Fluid Consistency: Nectar Thick. Full supervision, small bites/sips, 1 sip at a time, sit fully upright)  AMBULATORY STATUS COMMUNICATION OF NEEDS Skin   Limited Assist Verbally Normal                       Personal Care Assistance Level of Assistance  Bathing, Feeding, Dressing Bathing Assistance: Limited assistance Feeding assistance: Limited assistance Dressing Assistance: Limited assistance     Functional Limitations Info  Hearing, Sight Sight Info: Impaired Hearing Info: Impaired      SPECIAL CARE FACTORS FREQUENCY  PT (By licensed PT)     PT Frequency: 3x/week              Contractures      Additional Factors Info  Code Status, Allergies Code Status Info: DNR Allergies Info: Codeine, Statins           Current Medications (09/09/2017):  This is the current hospital active medication list Current Facility-Administered Medications  Medication Dose Route Frequency Provider Last Rate Last Dose  . acetaminophen (TYLENOL) tablet 650 mg  650 mg Oral Q6H PRN Reubin Milan, MD   650 mg at 09/04/17 6160   Or  . acetaminophen (TYLENOL) suppository 650 mg  650 mg Rectal Q6H PRN Reubin Milan, MD      . albuterol (PROVENTIL) (2.5 MG/3ML) 0.083% nebulizer solution 2.5 mg  2.5 mg Nebulization Q6H  PRN Reubin Milan, MD      . arformoterol [facility]) nebulizer solution 15 mcg  15 mcg Nebulization BID Orson Eva, MD   15 mcg at 09/09/17 0840  . budesonide (PULMICORT) nebulizer solution 0.5 mg  0.5 mg Nebulization BID Tat, David, MD   0.5 mg at 09/09/17 0834  . clobetasol ointment (TEMOVATE) 0.05 %   Topical BID PRN Tat, David, MD      . enoxaparin (LOVENOX) injection 50 mg  50 mg Subcutaneous Q24H Tat, David, MD      . famotidine (PEPCID) tablet 20 mg  20  mg Oral BID Reubin Milan, MD   20 mg at 09/09/17 0920  . ipratropium (ATROVENT) nebulizer solution 0.5 mg  0.5 mg Nebulization Q6H PRN Reubin Milan, MD      . ipratropium-albuterol (DUONEB) 0.5-2.5 (3) MG/3ML nebulizer solution 3 mL  3 mL Nebulization Q6H Orson Eva, MD   3 mL at 09/09/17 0623  . menthol-cetylpyridinium (CEPACOL) lozenge 3 mg  1 lozenge Oral PRN Tat, Shanon Brow, MD      . ondansetron [facility]) tablet 4 mg  4 mg Oral Q6H PRN Reubin Milan, MD       Or  . ondansetron [facility]) injection 4 mg  4 mg Intravenous Q6H PRN Reubin Milan, MD      . predniSONE (DELTASONE) tablet 60 mg  60 mg Oral Q breakfast Tat, Shanon Brow, MD   60 mg at 09/09/17 7628  . [place]   Oral PRN Tat, David, MD      . tamsulosin [facility]) capsule 0.4 mg  0.4 mg Oral Daily Reubin Milan, MD   0.4 mg at 09/09/17 0920  . traZODone (DESYREL) tablet 50 mg  50 mg Oral QHS Reubin Milan, MD   50 mg at 09/08/17 2126     Discharge Medications: TAKE these medications   acetaminophen 325 MG tablet Commonly known as:  TYLENOL Take 2 tablets (650 mg total) by mouth every 6 (six) hours as needed for mild pain, fever or headache. What changed:  how much to take  reasons to take this   albuterol 108 (90 Base) MCG/ACT inhaler Commonly known as:  PROVENTIL HFA;VENTOLIN HFA Inhale 2 puffs into the lungs every 6 (six) hours as needed for wheezing or shortness of breath.   budesonide-formoterol 160-4.5 MCG/ACT inhaler Commonly known as:  SYMBICORT Inhale 2 puffs into the lungs 2 (two) times daily.   clobetasol cream 0.05 % Commonly known as:  TEMOVATE Apply 1 application topically 2 (two) times daily as needed (irritated scalp).   COMPOUNDED TOPICALS BUILDER Apply 1 application topically 2 (two) times daily as needed (for rash). R-FLUTICASONE CR/KETOCONAZOLE CR--to underarms/groin area(s)   famotidine 20 MG tablet Commonly known as:  PEPCID Take 1 tablet (20 mg  total) by mouth 2 (two) times daily.   hydrocortisone cream 1 % Apply 1 application topically 2 (two) times daily as needed for itching.   mometasone 0.1 % cream Commonly known as:  ELOCON Apply 1 application topically 2 (two) times daily as needed (skin irritation).   predniSONE 10 MG tablet Commonly known as:  DELTASONE Take 6 tablets (60 mg total) by mouth daily with breakfast. And decrease by one tablet every 2 days   RESOURCE THICKENUP CLEAR Powd Use with thin liquids to make nectar thickened consistency liquids   tamsulosin 0.4 MG Caps capsule Commonly known as:  FLOMAX Take 0.4 mg by mouth daily.   traZODone 50 MG tablet  Commonly known as:  DESYREL Take 50 mg by mouth at bedtime.   triamcinolone cream 0.1 % Commonly known as:  KENALOG Apply 1 application topically 2 (two) times daily.   .  Relevant Imaging Results:  Relevant Lab Results:   Additional Information    Mcgwire Dasaro, Clydene Pugh, LCSW

## 2017-09-10 DIAGNOSIS — F0280 Dementia in other diseases classified elsewhere without behavioral disturbance: Secondary | ICD-10-CM | POA: Diagnosis not present

## 2017-09-10 DIAGNOSIS — J441 Chronic obstructive pulmonary disease with (acute) exacerbation: Secondary | ICD-10-CM | POA: Diagnosis not present

## 2017-09-10 DIAGNOSIS — J189 Pneumonia, unspecified organism: Secondary | ICD-10-CM | POA: Diagnosis not present

## 2017-09-10 DIAGNOSIS — G309 Alzheimer's disease, unspecified: Secondary | ICD-10-CM | POA: Diagnosis not present

## 2017-09-10 DIAGNOSIS — J440 Chronic obstructive pulmonary disease with acute lower respiratory infection: Secondary | ICD-10-CM | POA: Diagnosis not present

## 2017-09-10 DIAGNOSIS — I10 Essential (primary) hypertension: Secondary | ICD-10-CM | POA: Diagnosis not present

## 2017-09-10 DIAGNOSIS — K219 Gastro-esophageal reflux disease without esophagitis: Secondary | ICD-10-CM | POA: Diagnosis not present

## 2017-09-10 DIAGNOSIS — I2510 Atherosclerotic heart disease of native coronary artery without angina pectoris: Secondary | ICD-10-CM | POA: Diagnosis not present

## 2017-09-10 DIAGNOSIS — M359 Systemic involvement of connective tissue, unspecified: Secondary | ICD-10-CM | POA: Diagnosis not present

## 2017-09-10 DIAGNOSIS — E785 Hyperlipidemia, unspecified: Secondary | ICD-10-CM | POA: Diagnosis not present

## 2017-09-22 DIAGNOSIS — J449 Chronic obstructive pulmonary disease, unspecified: Secondary | ICD-10-CM | POA: Diagnosis not present

## 2017-09-22 DIAGNOSIS — F0390 Unspecified dementia without behavioral disturbance: Secondary | ICD-10-CM | POA: Diagnosis not present

## 2017-10-28 DIAGNOSIS — M79675 Pain in left toe(s): Secondary | ICD-10-CM | POA: Diagnosis not present

## 2017-10-28 DIAGNOSIS — B351 Tinea unguium: Secondary | ICD-10-CM | POA: Diagnosis not present

## 2017-10-28 DIAGNOSIS — M79674 Pain in right toe(s): Secondary | ICD-10-CM | POA: Diagnosis not present

## 2017-11-11 ENCOUNTER — Other Ambulatory Visit (HOSPITAL_COMMUNITY): Payer: Self-pay | Admitting: Internal Medicine

## 2017-11-11 ENCOUNTER — Ambulatory Visit (HOSPITAL_COMMUNITY)
Admission: RE | Admit: 2017-11-11 | Discharge: 2017-11-11 | Disposition: A | Discharge status: Home / Self Care | Payer: Medicare Other | Source: Ambulatory Visit | Attending: Internal Medicine | Admitting: Internal Medicine

## 2017-11-11 DIAGNOSIS — R05 Cough: Secondary | ICD-10-CM | POA: Diagnosis not present

## 2017-11-11 DIAGNOSIS — R059 Cough, unspecified: Secondary | ICD-10-CM

## 2017-11-11 DIAGNOSIS — J189 Pneumonia, unspecified organism: Secondary | ICD-10-CM | POA: Diagnosis not present

## 2017-11-11 DIAGNOSIS — R918 Other nonspecific abnormal finding of lung field: Secondary | ICD-10-CM | POA: Diagnosis not present

## 2017-11-11 DIAGNOSIS — R0602 Shortness of breath: Secondary | ICD-10-CM | POA: Diagnosis not present

## 2017-11-25 DIAGNOSIS — R600 Localized edema: Secondary | ICD-10-CM | POA: Diagnosis not present

## 2017-11-25 DIAGNOSIS — J449 Chronic obstructive pulmonary disease, unspecified: Secondary | ICD-10-CM | POA: Diagnosis not present

## 2018-01-22 DIAGNOSIS — B351 Tinea unguium: Secondary | ICD-10-CM | POA: Diagnosis not present

## 2018-01-22 DIAGNOSIS — M79674 Pain in right toe(s): Secondary | ICD-10-CM | POA: Diagnosis not present

## 2018-01-22 DIAGNOSIS — M79675 Pain in left toe(s): Secondary | ICD-10-CM | POA: Diagnosis not present

## 2018-02-24 DIAGNOSIS — F0150 Vascular dementia without behavioral disturbance: Secondary | ICD-10-CM | POA: Diagnosis not present

## 2018-02-24 DIAGNOSIS — J449 Chronic obstructive pulmonary disease, unspecified: Secondary | ICD-10-CM | POA: Diagnosis not present

## 2018-04-02 ENCOUNTER — Encounter: Payer: Self-pay | Admitting: Internal Medicine

## 2018-04-10 DIAGNOSIS — R509 Fever, unspecified: Secondary | ICD-10-CM | POA: Diagnosis not present

## 2018-04-14 DIAGNOSIS — J449 Chronic obstructive pulmonary disease, unspecified: Secondary | ICD-10-CM | POA: Diagnosis not present

## 2018-04-16 DIAGNOSIS — B351 Tinea unguium: Secondary | ICD-10-CM | POA: Diagnosis not present

## 2018-04-16 DIAGNOSIS — M79675 Pain in left toe(s): Secondary | ICD-10-CM | POA: Diagnosis not present

## 2018-06-30 DIAGNOSIS — F0150 Vascular dementia without behavioral disturbance: Secondary | ICD-10-CM | POA: Diagnosis not present

## 2018-06-30 DIAGNOSIS — J449 Chronic obstructive pulmonary disease, unspecified: Secondary | ICD-10-CM | POA: Diagnosis not present

## 2018-07-02 DIAGNOSIS — M79674 Pain in right toe(s): Secondary | ICD-10-CM | POA: Diagnosis not present

## 2018-07-02 DIAGNOSIS — B351 Tinea unguium: Secondary | ICD-10-CM | POA: Diagnosis not present

## 2018-09-24 DIAGNOSIS — M79674 Pain in right toe(s): Secondary | ICD-10-CM | POA: Diagnosis not present

## 2018-09-24 DIAGNOSIS — B351 Tinea unguium: Secondary | ICD-10-CM | POA: Diagnosis not present

## 2018-09-24 DIAGNOSIS — M79675 Pain in left toe(s): Secondary | ICD-10-CM | POA: Diagnosis not present

## 2018-11-03 DIAGNOSIS — J449 Chronic obstructive pulmonary disease, unspecified: Secondary | ICD-10-CM | POA: Diagnosis not present

## 2018-11-03 DIAGNOSIS — F0150 Vascular dementia without behavioral disturbance: Secondary | ICD-10-CM | POA: Diagnosis not present

## 2018-11-08 ENCOUNTER — Encounter (HOSPITAL_COMMUNITY): Payer: Self-pay

## 2018-11-08 ENCOUNTER — Emergency Department (HOSPITAL_COMMUNITY): Payer: Medicare Other

## 2018-11-08 ENCOUNTER — Other Ambulatory Visit: Payer: Self-pay

## 2018-11-08 ENCOUNTER — Emergency Department (HOSPITAL_COMMUNITY)
Admission: EM | Admit: 2018-11-08 | Discharge: 2018-11-08 | Disposition: A | Discharge status: Home / Self Care | Payer: Medicare Other | Attending: Emergency Medicine | Admitting: Emergency Medicine

## 2018-11-08 DIAGNOSIS — G309 Alzheimer's disease, unspecified: Secondary | ICD-10-CM | POA: Diagnosis not present

## 2018-11-08 DIAGNOSIS — W19XXXA Unspecified fall, initial encounter: Secondary | ICD-10-CM | POA: Diagnosis not present

## 2018-11-08 DIAGNOSIS — I4891 Unspecified atrial fibrillation: Secondary | ICD-10-CM | POA: Diagnosis not present

## 2018-11-08 DIAGNOSIS — J449 Chronic obstructive pulmonary disease, unspecified: Secondary | ICD-10-CM | POA: Diagnosis not present

## 2018-11-08 DIAGNOSIS — I10 Essential (primary) hypertension: Secondary | ICD-10-CM | POA: Diagnosis not present

## 2018-11-08 DIAGNOSIS — Z87891 Personal history of nicotine dependence: Secondary | ICD-10-CM | POA: Insufficient documentation

## 2018-11-08 DIAGNOSIS — Z79899 Other long term (current) drug therapy: Secondary | ICD-10-CM | POA: Insufficient documentation

## 2018-11-08 DIAGNOSIS — R531 Weakness: Secondary | ICD-10-CM | POA: Diagnosis not present

## 2018-11-08 DIAGNOSIS — I2510 Atherosclerotic heart disease of native coronary artery without angina pectoris: Secondary | ICD-10-CM | POA: Insufficient documentation

## 2018-11-08 DIAGNOSIS — R0902 Hypoxemia: Secondary | ICD-10-CM | POA: Diagnosis not present

## 2018-11-08 LAB — CBC WITH DIFFERENTIAL/PLATELET
Abs Immature Granulocytes: 0.05 10*3/uL (ref 0.00–0.07)
Basophils Absolute: 0 10*3/uL (ref 0.0–0.1)
Basophils Relative: 0 %
EOS PCT: 1 %
Eosinophils Absolute: 0.1 10*3/uL (ref 0.0–0.5)
HCT: 47.3 % (ref 39.0–52.0)
HEMOGLOBIN: 15 g/dL (ref 13.0–17.0)
Immature Granulocytes: 1 %
LYMPHS PCT: 10 %
Lymphs Abs: 1 10*3/uL (ref 0.7–4.0)
MCH: 27.8 pg (ref 26.0–34.0)
MCHC: 31.7 g/dL (ref 30.0–36.0)
MCV: 87.6 fL (ref 80.0–100.0)
MONO ABS: 1 10*3/uL (ref 0.1–1.0)
MONOS PCT: 10 %
Neutro Abs: 7.9 10*3/uL — ABNORMAL HIGH (ref 1.7–7.7)
Neutrophils Relative %: 78 %
Platelets: 193 10*3/uL (ref 150–400)
RBC: 5.4 MIL/uL (ref 4.22–5.81)
RDW: 13.2 % (ref 11.5–15.5)
WBC: 10 10*3/uL (ref 4.0–10.5)
nRBC: 0 % (ref 0.0–0.2)

## 2018-11-08 LAB — COMPREHENSIVE METABOLIC PANEL
ALT: 25 U/L (ref 0–44)
AST: 32 U/L (ref 15–41)
Albumin: 3.9 g/dL (ref 3.5–5.0)
Alkaline Phosphatase: 60 U/L (ref 38–126)
Anion gap: 10 (ref 5–15)
BILIRUBIN TOTAL: 1 mg/dL (ref 0.3–1.2)
BUN: 20 mg/dL (ref 8–23)
CHLORIDE: 107 mmol/L (ref 98–111)
CO2: 23 mmol/L (ref 22–32)
CREATININE: 1.18 mg/dL (ref 0.61–1.24)
Calcium: 9.2 mg/dL (ref 8.9–10.3)
GFR calc Af Amer: >60 mL/min (ref >60)
GFR, EST NON AFRICAN AMERICAN: 58 mL/min — AB (ref >60)
GLUCOSE: 116 mg/dL — AB (ref 70–99)
Potassium: 4.2 mmol/L (ref 3.5–5.1)
Sodium: 140 mmol/L (ref 135–145)
TOTAL PROTEIN: 7.3 g/dL (ref 6.5–8.1)

## 2018-11-08 LAB — URINALYSIS, ROUTINE W REFLEX MICROSCOPIC
BILIRUBIN URINE: NEGATIVE
GLUCOSE, UA: NEGATIVE mg/dL
HGB URINE DIPSTICK: NEGATIVE
Ketones, ur: NEGATIVE mg/dL
Leukocytes, UA: NEGATIVE
Nitrite: NEGATIVE
PH: 5 (ref 5.0–8.0)
Protein, ur: NEGATIVE mg/dL
SPECIFIC GRAVITY, URINE: 1.019 (ref 1.005–1.030)

## 2018-11-08 MED ORDER — SODIUM CHLORIDE 0.9 % IV BOLUS
500.0000 mL | Freq: Once | INTRAVENOUS | Status: AC
  Administered 2018-11-08: 500 mL via INTRAVENOUS

## 2018-11-08 NOTE — ED Triage Notes (Addendum)
Pt brought in by EMS from [facility] due to fall related generalized weakness. Staff reported not "normal". Pt states he stood up from bed and fell. Denies any pain. Pt reports staff assisted him from first fall and then he got dressed and fell the second time. Pt alert and answering appropriately

## 2018-11-08 NOTE — ED Provider Notes (Signed)
[facility] EMERGENCY DEPARTMENT Provider Note   CSN: [id number] Arrival date & time: 11/08/18  1000     History   Chief Complaint Chief Complaint  Patient presents with  . Fall  . Weakness    HPI Tyler Mejia is a 82 y.o. male.  HPI   Patient presents from his nursing care facility for evaluation of generalized weakness and not acting right.  He is unable to give any history.  His daughter is here with him.  At the memory care unit, this morning, patient stood up from bed and fell.  He was helped back into bed.  Later he fell again while he was getting dressed.  The patient is normally fairly functional and is able to assist with his activities of daily living including dressing and shaving.  He is DNR, with a MOST form.  There have been no other recent illnesses.  Level 5 caveat-dementia  Past Medical History:  Diagnosis Date  . Alzheimer's dementia ([place])   . Atrial fibrillation, transient ([place])    a. Post-op from CABG 08/2015.  Marland Kitchen CAD (coronary artery disease)    a. DES to the RCA March 2013 with residual dz rx medically. b. s/p CABGx4 in 08/2015.  . Collagen vascular disease ([place])   . COPD (chronic obstructive pulmonary disease) ([place])   . Essential hypertension   . GERD (gastroesophageal reflux disease)   . History of stroke    January 2017  . HLD (hyperlipidemia)    a. H/o multiple statin intolerances.  Mar[place] Kitchen Hx of adenomatous colonic polyps   . IBS (irritable bowel syndrome)   . Nephrolithiasis   . NSTEMI (non-ST elevated myocardial infarction) [facility]) March 2013  . Obesity   . Stroke [facility])     Patient Active Problem List   Diagnosis Date Noted  . Sepsis due to undetermined organism ([place]) 09/04/2017  . Acute respiratory failure with hypoxia ([place]) 09/04/2017  . COPD with acute exacerbation ([place]) 09/04/2017  . Sepsis due to pneumonia ([place]) 09/03/2017  . Abnormal ECG 09/03/2017  . CAP (community acquired pneumonia) 09/03/2017  . Dementia ([place]) 05/21/2016  .  Coronary artery disease involving coronary bypass graft of native heart with unspecified angina pectoris 02/29/2016  . Cerebrovascular accident (CVA) due to bilateral thrombosis of middle cerebral arteries ([place]) 02/29/2016  . CVA (cerebral infarction) 12/21/2015  . Altered mental status 12/21/2015  . Ileus, postoperative ([place]) 08/23/2015  . S/P CABG x 4 08/11/2015  . Obesity 08/10/2015  . Accelerating angina ([place]) 08/09/2015  . Mucosal abnormality of esophagus   . Hiatal hernia   . Dysphagia 03/15/2015  . Schatzki's ring 03/15/2015  . COPD (chronic obstructive pulmonary disease) ([place]) 08/21/2012  . Coronary artery disease involving native coronary artery 05/20/2012  . HLD (hyperlipidemia) 04/10/2012  . Shortness of breath 02/28/2012  . Essential hypertension 02/28/2012    Past Surgical History:  Procedure Laterality Date  . APPENDECTOMY    . BACK SURGERY    . CARDIAC CATHETERIZATION N/A 08/09/2015   Procedure: Left Heart Cath and Coronary Angiography;  Surgeon: Jettie Booze, MD;  Location: [place] CV LAB;  Service: Cardiovascular;  Laterality: N/A;  . COLONOSCOPY  08/21/2004   Pedunculated polyp at the splenic flexure  . COLONOSCOPY  09/2009   Dr. Rourk--> Cecal and hepatic flexure polyps, tubular adenomas. Next colonoscopy October 2013  . COLONOSCOPY N/A 05/07/2013   RMR: colonic polyp/colonic diverticulosis  . CORONARY ARTERY BYPASS GRAFT N/A 08/11/2015   Procedure: CORONARY ARTERY BYPASS GRAFTING (  CABG) X4 UTILIZING THE LEFT INTERNAL MAMMARY ARTERY TO LAD AND ENDOSCOPICALLY HARVESTED BILATERAL SAPHENEOUS VEINS TO DIAGONAL,OM1 AND OM2;  Surgeon: Ivin Poot, MD;  Location: [place];  Service: Open Heart Surgery;  Laterality: N/A;  . ESOPHAGOGASTRODUODENOSCOPY  2003   distal erosion/small hiatal hernia  . ESOPHAGOGASTRODUODENOSCOPY  09/2009   Dr. Evalee Mutton ring, mild distal esophageal erosions, status post Maloney dilation, small hiatal hernia.  Marland Kitchen  ESOPHAGOGASTRODUODENOSCOPY N/A 03/16/2015   SEG:BTDVV HH/focal area of distal esophageal s/p bx  . LEFT HEART CATHETERIZATION WITH CORONARY ANGIOGRAM N/A 03/02/2012   Procedure: LEFT HEART CATHETERIZATION WITH CORONARY ANGIOGRAM;  Surgeon: Peter M Martinique, MD;  Location: [facility] CATH LAB;  Service: Cardiovascular;  Laterality: N/A;  . PERCUTANEOUS CORONARY STENT INTERVENTION (PCI-S) N/A 03/02/2012   Procedure: PERCUTANEOUS CORONARY STENT INTERVENTION (PCI-S);  Surgeon: Peter M Martinique, MD;  Location: [facility] CATH LAB;  Service: Cardiovascular;  Laterality: N/A;  . TEE WITHOUT CARDIOVERSION N/A 08/11/2015   Procedure: TRANSESOPHAGEAL ECHOCARDIOGRAM (TEE);  Surgeon: Ivin Poot, MD;  Location: [place];  Service: Open Heart Surgery;  Laterality: N/A;        Home Medications    Prior to Admission medications   Medication Sig Start Date End Date Taking? Authorizing Provider  acetaminophen (TYLENOL) 325 MG tablet Take 2 tablets (650 mg total) by mouth every 6 (six) hours as needed for mild pain, fever or headache. Patient taking differently: Take 325 mg by mouth every 6 (six) hours as needed for mild pain or fever.  08/24/15  Yes Barrett, Erin R, PA-C  albuterol (PROVENTIL HFA;VENTOLIN HFA) 108 (90 Base) MCG/ACT inhaler Inhale 2 puffs into the lungs every 6 (six) hours as needed for wheezing or shortness of breath. 09/08/17  Yes Tat, Shanon Brow, MD  budesonide-formoterol [facility]) 160-4.5 MCG/ACT inhaler Inhale 2 puffs into the lungs 2 (two) times daily. 09/09/17  Yes Tat, Shanon Brow, MD  clobetasol cream (TEMOVATE) 6.16 % Apply 1 application topically 2 (two) times daily as needed (irritated scalp).   Yes [provider]  compounded topicals builder Apply 1 application topically 2 (two) times daily as needed (for rash). R-FLUTICASONE CR/KETOCONAZOLE CR--to underarms/groin area(s)   Yes [provider]  famotidine (PEPCID) 20 MG tablet Take 1 tablet (20 mg total) by mouth 2 (two) times daily. 09/08/17  Yes  Tat, Shanon Brow, MD  hydrocortisone cream 1 % Apply 1 application topically 2 (two) times daily as needed for itching.   Yes [provider]  mometasone (ELOCON) 0.1 % cream Apply 1 application topically 2 (two) times daily as needed (skin irritation).   Yes [provider]  tamsulosin (FLOMAX) 0.4 MG CAPS capsule Take 0.4 mg by mouth daily. 04/12/16  Yes [provider]  traZODone (DESYREL) 50 MG tablet Take 50 mg by mouth at bedtime.    Yes [provider]  triamcinolone cream (KENALOG) 0.1 % Apply 1 application topically 2 (two) times daily.   Yes [provider]  Maltodextrin-Xanthan Gum ([place]) POWD Use with thin liquids to make nectar thickened consistency liquids 09/08/17   Tat, Shanon Brow, MD  predniSONE (DELTASONE) 10 MG tablet Take 6 tablets (60 mg total) by mouth daily with breakfast. And decrease by one tablet every 2 days 09/10/17   Orson Eva, MD    Family History Family History  Problem Relation Age of Onset  . Pneumonia Father   . Heart disease Mother   . Diabetes Mother   . Stroke Mother   . Stroke Sister   .  Breast cancer Sister        2 sisters  . Esophageal cancer Brother   . Colon cancer Neg Hx     Social History Social History   Tobacco Use  . Smoking status: Former Smoker    Packs/day: 1.50    Years: 45.00    Pack years: 67.50    Types: Cigarettes    Last attempt to quit: 12/09/2001    Years since quitting: 16.9  . Smokeless tobacco: Never Used  . Tobacco comment: quit about 9 years ago  Substance Use Topics  . Alcohol use: No    Alcohol/week: 0.0 standard drinks    Comment: Very rarely  . Drug use: No     Allergies   Codeine and Statins   Review of Systems Review of Systems  Unable to perform ROS: Dementia     Physical Exam Updated Vital Signs BP (!) 147/75 (BP Location: Left Arm)   Pulse 73   Temp 98.1 F (36.7 C) (Oral)   Resp 17   Wt 113.9 kg   SpO2 94%   BMI 37.07 kg/m    Physical Exam  Constitutional: He appears well-developed.  Elderly, frail  HENT:  Head: Normocephalic and atraumatic.  Right Ear: External ear normal.  Left Ear: External ear normal.  Mouth/Throat: Oropharynx is clear and moist.  Eyes: Pupils are equal, round, and reactive to light. Conjunctivae and EOM are normal.  Neck: Normal range of motion and phonation normal. Neck supple.  Cardiovascular: Normal rate, regular rhythm and normal heart sounds.  Pulmonary/Chest: Effort normal and breath sounds normal. No stridor. No respiratory distress. He has no wheezes. He exhibits no bony tenderness.  Abdominal: Soft. There is no tenderness.  Musculoskeletal: Normal range of motion. He exhibits edema (1+ lower legs, bilaterally).  Neurological: He is alert. No cranial nerve deficit or sensory deficit. He exhibits normal muscle tone. Coordination normal.  Skin: Skin is warm, dry and intact. No erythema.  Psychiatric: He has a normal mood and affect. His behavior is normal.  Nursing note and vitals reviewed.    ED Treatments / Results  Labs (all labs ordered are listed, but only abnormal results are displayed) Labs Reviewed  COMPREHENSIVE METABOLIC PANEL - Abnormal; Notable for the following components:      Result Value   Glucose, Bld 116 (*)    GFR calc non Af Amer 58 (*)    All other components within normal limits  CBC WITH DIFFERENTIAL/PLATELET - Abnormal; Notable for the following components:   Neutro Abs 7.9 (*)    All other components within normal limits  URINALYSIS, ROUTINE W REFLEX MICROSCOPIC    EKG EKG Interpretation  Date/Time:  Sunday November 08 2018 10:34:10 EST Ventricular Rate:  82 PR Interval:    QRS Duration: 96 QT Interval:  380 QTC Calculation: 444 R Axis:   92 Text Interpretation:  Sinus or ectopic atrial rhythm Right axis deviation Borderline repolarization abnormality Since last tracing repolarization abnormality is new Confirmed by Daleen Bo  [phone]) on 11/08/2018 2:47:37 PM   Radiology Dg Chest 1 View  Result Date: 11/08/2018 CLINICAL DATA:  Weakness today. EXAM: CHEST  1 VIEW COMPARISON:  PA and lateral chest 11/11/2017. FINDINGS: The lungs are clear. Heart size is upper normal. The patient is status post CABG. No pneumothorax or pleural fluid. No acute or focal bony abnormality. IMPRESSION: No acute disease. Electronically Signed   By: Inge Rise M.D.   On: 11/08/2018 13:46    Procedures Procedures (  including critical care time)  Medications Ordered in ED Medications  sodium chloride 0.9 % bolus 500 mL (500 mLs Intravenous New Bag/Given 11/08/18 1407)     Initial Impression / Assessment and Plan / ED Course  I have reviewed the triage vital signs and the nursing notes.  Pertinent labs & imaging results that were available during my care of the patient were reviewed by me and considered in my medical decision making (see chart for details).  Clinical Course as of Nov 08 1500  Sun Nov 08, 2018  1446 Normal  Urinalysis, Routine w reflex microscopic [EW]  1446 Normal except glucose  Comprehensive metabolic panel(!) [EW]  3893 Normal  CBC with Differential(!) [EW]  1446 No CHF or infiltrate, images reviewed by me.  DG Chest 1 View [EW]    Clinical Course User Index [EW] Daleen Bo, MD     Patient Vitals for the past 24 hrs:  BP Temp Temp src Pulse Resp SpO2 Weight  11/08/18 1409 (!) 147/75 - - 73 17 94 % -  11/08/18 1010 - - - - - - 113.9 kg  11/08/18 1009 129/70 98.1 F (36.7 C) Oral 87 16 93 % -    2:49 PM Reevaluation with update and discussion. After initial assessment and treatment, an updated evaluation reveals patient is alert and comfortable.  Vital signs are reassuring.  His daughter wonders if he needs a cane.  I discussed having physical therapy assess him for that need, that can be done at his facility.  Findings discussed and all questions were answered. Daleen Bo   Medical Decision  Making: Fall with nonspecific complaints.  No evidence for serious bacterial infection or metabolic instability.  Doubt injury from falls.  CRITICAL CARE-no Performed by: Daleen Bo  Nursing Notes Reviewed/ Care Coordinated Applicable Imaging Reviewed Interpretation of Laboratory Data incorporated into ED treatment  The patient appears reasonably screened and/or stabilized for discharge and I doubt any other medical condition or other [facility] requiring further screening, evaluation, or treatment in the ED at this time prior to discharge.  Plan: Home Medications-continue usual medications; Home Treatments-gradually advance diet, fluids and activity; return here if the recommended treatment, does not improve the symptoms; Recommended follow up-PCP follow-up as needed.  Physical therapy assessment in the home for possible assistive devices versus strengthening recommendations.   Final Clinical Impressions(s) / ED Diagnoses   Final diagnoses:  Fall, initial encounter  Weakness    ED Discharge Orders         [place]     11/08/18 1500    Face-to-face encounter (required for Medicare/Medicaid patients)    Comments:  I Daleen Bo certify that this patient is under my care and that I, or a nurse practitioner or physician's assistant working with me, had a face-to-face encounter that meets the physician face-to-face encounter requirements with this patient on 11/08/2018. The encounter with the patient was in whole, or in part for the following medical condition(s) which is the primary reason for home health care (List medical condition): fall   11/08/18 1500           Daleen Bo, MD 11/08/18 1502

## 2018-11-08 NOTE — Discharge Instructions (Signed)
Make sure he is getting plenty of rest, drinking a lot of fluids and eating 3 meals each day.  We are going to have physical therapist assess him for possible assistive devices to ambulate, and suggestion for strengthening exercises.

## 2018-11-19 DIAGNOSIS — L814 Other melanin hyperpigmentation: Secondary | ICD-10-CM | POA: Diagnosis not present

## 2018-11-19 DIAGNOSIS — Z1283 Encounter for screening for malignant neoplasm of skin: Secondary | ICD-10-CM | POA: Diagnosis not present

## 2018-11-19 DIAGNOSIS — D225 Melanocytic nevi of trunk: Secondary | ICD-10-CM | POA: Diagnosis not present

## 2018-11-19 DIAGNOSIS — L570 Actinic keratosis: Secondary | ICD-10-CM | POA: Diagnosis not present

## 2018-11-19 DIAGNOSIS — X32XXXA Exposure to sunlight, initial encounter: Secondary | ICD-10-CM | POA: Diagnosis not present

## 2018-12-04 ENCOUNTER — Other Ambulatory Visit: Payer: Self-pay

## 2018-12-04 ENCOUNTER — Emergency Department (HOSPITAL_COMMUNITY)
Admission: EM | Admit: 2018-12-04 | Discharge: 2018-12-04 | Disposition: A | Discharge status: Home / Self Care | Payer: Medicare Other | Attending: Emergency Medicine | Admitting: Emergency Medicine

## 2018-12-04 ENCOUNTER — Encounter (HOSPITAL_COMMUNITY): Payer: Self-pay

## 2018-12-04 ENCOUNTER — Emergency Department (HOSPITAL_COMMUNITY): Payer: Medicare Other

## 2018-12-04 DIAGNOSIS — R404 Transient alteration of awareness: Secondary | ICD-10-CM | POA: Diagnosis not present

## 2018-12-04 DIAGNOSIS — J449 Chronic obstructive pulmonary disease, unspecified: Secondary | ICD-10-CM | POA: Diagnosis not present

## 2018-12-04 DIAGNOSIS — I2510 Atherosclerotic heart disease of native coronary artery without angina pectoris: Secondary | ICD-10-CM | POA: Diagnosis not present

## 2018-12-04 DIAGNOSIS — R05 Cough: Secondary | ICD-10-CM | POA: Insufficient documentation

## 2018-12-04 DIAGNOSIS — R51 Headache: Secondary | ICD-10-CM | POA: Insufficient documentation

## 2018-12-04 DIAGNOSIS — R1111 Vomiting without nausea: Secondary | ICD-10-CM | POA: Diagnosis not present

## 2018-12-04 DIAGNOSIS — Z79899 Other long term (current) drug therapy: Secondary | ICD-10-CM | POA: Diagnosis not present

## 2018-12-04 DIAGNOSIS — G309 Alzheimer's disease, unspecified: Secondary | ICD-10-CM | POA: Diagnosis not present

## 2018-12-04 DIAGNOSIS — W19XXXA Unspecified fall, initial encounter: Secondary | ICD-10-CM

## 2018-12-04 DIAGNOSIS — R059 Cough, unspecified: Secondary | ICD-10-CM

## 2018-12-04 DIAGNOSIS — R062 Wheezing: Secondary | ICD-10-CM | POA: Diagnosis not present

## 2018-12-04 DIAGNOSIS — I10 Essential (primary) hypertension: Secondary | ICD-10-CM | POA: Diagnosis not present

## 2018-12-04 DIAGNOSIS — Z87891 Personal history of nicotine dependence: Secondary | ICD-10-CM | POA: Diagnosis not present

## 2018-12-04 DIAGNOSIS — S0990XA Unspecified injury of head, initial encounter: Secondary | ICD-10-CM | POA: Diagnosis not present

## 2018-12-04 DIAGNOSIS — S299XXA Unspecified injury of thorax, initial encounter: Secondary | ICD-10-CM | POA: Diagnosis not present

## 2018-12-04 DIAGNOSIS — I4891 Unspecified atrial fibrillation: Secondary | ICD-10-CM | POA: Insufficient documentation

## 2018-12-04 HISTORY — DX: Hyperlipidemia, unspecified: E78.5

## 2018-12-04 HISTORY — DX: Dysphagia, unspecified: R13.10

## 2018-12-04 LAB — CBC WITH DIFFERENTIAL/PLATELET
Abs Immature Granulocytes: 0.03 10*3/uL (ref 0.00–0.07)
BASOS ABS: 0 10*3/uL (ref 0.0–0.1)
Basophils Relative: 1 %
EOS ABS: 1 10*3/uL — AB (ref 0.0–0.5)
Eosinophils Relative: 13 %
HCT: 42.6 % (ref 39.0–52.0)
Hemoglobin: 13.5 g/dL (ref 13.0–17.0)
Immature Granulocytes: 0 %
LYMPHS PCT: 9 %
Lymphs Abs: 0.7 10*3/uL (ref 0.7–4.0)
MCH: 28 pg (ref 26.0–34.0)
MCHC: 31.7 g/dL (ref 30.0–36.0)
MCV: 88.4 fL (ref 80.0–100.0)
Monocytes Absolute: 0.9 10*3/uL (ref 0.1–1.0)
Monocytes Relative: 12 %
NEUTROS PCT: 65 %
NRBC: 0 % (ref 0.0–0.2)
Neutro Abs: 4.8 10*3/uL (ref 1.7–7.7)
Platelets: 141 10*3/uL — ABNORMAL LOW (ref 150–400)
RBC: 4.82 MIL/uL (ref 4.22–5.81)
RDW: 13.2 % (ref 11.5–15.5)
WBC: 7.5 10*3/uL (ref 4.0–10.5)

## 2018-12-04 LAB — COMPREHENSIVE METABOLIC PANEL
ALBUMIN: 3.5 g/dL (ref 3.5–5.0)
ALT: 20 U/L (ref 0–44)
ANION GAP: 6 (ref 5–15)
AST: 24 U/L (ref 15–41)
Alkaline Phosphatase: 51 U/L (ref 38–126)
BUN: 23 mg/dL (ref 8–23)
CHLORIDE: 109 mmol/L (ref 98–111)
CO2: 24 mmol/L (ref 22–32)
Calcium: 8.1 mg/dL — ABNORMAL LOW (ref 8.9–10.3)
Creatinine, Ser: 1.15 mg/dL (ref 0.61–1.24)
GFR calc Af Amer: >60 mL/min (ref >60)
GFR calc non Af Amer: 60 mL/min — ABNORMAL LOW (ref >60)
Glucose, Bld: 114 mg/dL — ABNORMAL HIGH (ref 70–99)
POTASSIUM: 4.1 mmol/L (ref 3.5–5.1)
SODIUM: 139 mmol/L (ref 135–145)
Total Bilirubin: 1.3 mg/dL — ABNORMAL HIGH (ref 0.3–1.2)
Total Protein: 6.3 g/dL — ABNORMAL LOW (ref 6.5–8.1)

## 2018-12-04 LAB — LACTIC ACID, PLASMA: Lactic Acid, Venous: 1.8 mmol/L (ref 0.5–1.9)

## 2018-12-04 MED ORDER — SODIUM CHLORIDE 0.9 % IV BOLUS
500.0000 mL | Freq: Once | INTRAVENOUS | Status: AC
  Administered 2018-12-04: 500 mL via INTRAVENOUS

## 2018-12-04 MED ORDER — SODIUM CHLORIDE 0.9 % IV SOLN
INTRAVENOUS | Status: DC
  Administered 2018-12-04: 10:00:00 via INTRAVENOUS

## 2018-12-04 NOTE — ED Notes (Signed)
Pt transported to Xray. 

## 2018-12-04 NOTE — ED Triage Notes (Signed)
EMS reports pt has dementia and is a resident at [facility].  Reports pt was incontinent of stool this morning and slipped in it and fell.  EMS says staff reported his metal status was altered but another staff member stated his mental status was his baseline.  Reports cough.  Pt vomited x 1 with ems.  EMS administered zofran 4mg  IM then started IV.   CBG 127, 138/77bp, 92HR, 96%RA.

## 2018-12-04 NOTE — ED Provider Notes (Signed)
[facility] EMERGENCY DEPARTMENT Provider Note   CSN: [id number] Arrival date & time: 12/04/18  2355     History   Chief Complaint Chief Complaint  Patient presents with  . Fall    HPI Tyler Mejia is a 82 y.o. male.  Patient brought in by EMS from [place].  Patient was incontinent of stool in the morning and slipped and fell.  EMS reported that his mental status was altered but another staff member stated his mental status was at his baseline.  His daughter did arrive she felt his mental status was baseline.  She said he always a little bit confused conversing in the morning and he has a chronic cough.  EMS reported cough.  Patient vomited once with EMS he was given Zofran.  EMS reported his room air sats to be 96%.  They were low borderline when he first got here so patient was placed on 2 L of nasal cannula oxygen.  No obvious trauma or injury.  Patient is a DNR.     Past Medical History:  Diagnosis Date  . Alzheimer's dementia ([place])   . Atrial fibrillation, transient ([place])    a. Post-op from CABG 08/2015.  Marland Kitchen CAD (coronary artery disease)    a. DES to the RCA March 2013 with residual dz rx medically. b. s/p CABGx4 in 08/2015.  . Collagen vascular disease ([place])   . COPD (chronic obstructive pulmonary disease) ([place])   . Dysphagia   . Essential hypertension   . GERD (gastroesophageal reflux disease)   . History of stroke    January 2017  . HLD (hyperlipidemia)    a. H/o multiple statin intolerances.  Marland Kitchen Hx of adenomatous colonic polyps   . Hyperlipidemia   . IBS (irritable bowel syndrome)   . Nephrolithiasis   . NSTEMI (non-ST elevated myocardial infarction) [facility]) March 2013  . Obesity   . Stroke [facility])     Patient Active Problem List   Diagnosis Date Noted  . Sepsis due to undetermined organism ([place]) 09/04/2017  . Acute respiratory failure with hypoxia ([place]) 09/04/2017  . COPD with acute exacerbation ([place]) 09/04/2017  . Sepsis due to pneumonia ([place]) 09/03/2017    . Abnormal ECG 09/03/2017  . CAP (community acquired pneumonia) 09/03/2017  . Dementia ([place]) 05/21/2016  . Coronary artery disease involving coronary bypass graft of native heart with unspecified angina pectoris 02/29/2016  . Cerebrovascular accident (CVA) due to bilateral thrombosis of middle cerebral arteries ([place]) 02/29/2016  . CVA (cerebral infarction) 12/21/2015  . Altered mental status 12/21/2015  . Ileus, postoperative ([place]) 08/23/2015  . S/P CABG x 4 08/11/2015  . Obesity 08/10/2015  . Accelerating angina ([place]) 08/09/2015  . Mucosal abnormality of esophagus   . Hiatal hernia   . Dysphagia 03/15/2015  . Schatzki's ring 03/15/2015  . COPD (chronic obstructive pulmonary disease) ([place]) 08/21/2012  . Coronary artery disease involving native coronary artery 05/20/2012  . HLD (hyperlipidemia) 04/10/2012  . Shortness of breath 02/28/2012  . Essential hypertension 02/28/2012    Past Surgical History:  Procedure Laterality Date  . APPENDECTOMY    . BACK SURGERY    . CARDIAC CATHETERIZATION N/A 08/09/2015   Procedure: Left Heart Cath and Coronary Angiography;  Surgeon: Jettie Booze, MD;  Location: [place] CV LAB;  Service: Cardiovascular;  Laterality: N/A;  . COLONOSCOPY  08/21/2004   Pedunculated polyp at the splenic flexure  . COLONOSCOPY  09/2009   Dr. Rourk--> Cecal and hepatic flexure polyps, tubular adenomas. Next  colonoscopy October 2013  . COLONOSCOPY N/A 05/07/2013   RMR: colonic polyp/colonic diverticulosis  . CORONARY ARTERY BYPASS GRAFT N/A 08/11/2015   Procedure: CORONARY ARTERY BYPASS GRAFTING (CABG) X4 UTILIZING THE LEFT INTERNAL MAMMARY ARTERY TO LAD AND ENDOSCOPICALLY HARVESTED BILATERAL SAPHENEOUS VEINS TO DIAGONAL,OM1 AND OM2;  Surgeon: Ivin Poot, MD;  Location: [place];  Service: Open Heart Surgery;  Laterality: N/A;  . ESOPHAGOGASTRODUODENOSCOPY  2003   distal erosion/small hiatal hernia  . ESOPHAGOGASTRODUODENOSCOPY  09/2009   Dr.  Evalee Mutton ring, mild distal esophageal erosions, status post Maloney dilation, small hiatal hernia.  Marland Kitchen ESOPHAGOGASTRODUODENOSCOPY N/A 03/16/2015   FOY:DXAJO HH/focal area of distal esophageal s/p bx  . LEFT HEART CATHETERIZATION WITH CORONARY ANGIOGRAM N/A 03/02/2012   Procedure: LEFT HEART CATHETERIZATION WITH CORONARY ANGIOGRAM;  Surgeon: Peter M Martinique, MD;  Location: [facility] C[facility] LAB;  Service: Cardiovascular;  Laterality: N/A;  . PERCUTANEOUS CORONARY STENT INTERVENTION (PCI-S) N/A 03/02/2012   Procedure: PERCUTANEOUS CORONARY STENT INTERVENTION (PCI-S);  Surgeon: Peter M Martinique, MD;  Location: [facility] CATH LAB;  Service: Cardiovascular;  Laterality: N/A;  . TEE WITHOUT CARDIOVERSION N/A 08/11/2015   Procedure: TRANSESOPHAGEAL ECHOCARDIOGRAM (TEE);  Surgeon: Ivin Poot, MD;  Location: [place];  Service: Open Heart Surgery;  Laterality: N/A;        Home Medications    Prior to Admission medications   Medication Sig Start Date End Date Taking? Authorizing Provider  acetaminophen (TYLENOL) 325 MG tablet Take 2 tablets (650 mg total) by mouth every 6 (six) hours as needed for mild pain, fever or headache. Patient taking differently: Take 325 mg by mouth every 6 (six) hours as needed for mild pain or fever.  08/24/15   Barrett, Erin R, PA-C  albuterol (PROVENTIL HFA;VENTOLIN HFA) 108 (90 Base) MCG/ACT inhaler Inhale 2 puffs into the lungs every 6 (six) hours as needed for wheezing or shortness of breath. 09/08/17   Orson Eva, MD  budesonide-formoterol (SYMBICORT) 160-4.5 MCG/ACT inhaler Inhale 2 puffs into the lungs 2 (two) times daily. 09/09/17   Orson Eva, MD  clobetasol cream (TEMOVATE) 8.78 % Apply 1 application topically 2 (two) times daily as needed (irritated scalp).    [provider]  compounded topicals builder Apply 1 application topically 2 (two) times daily as needed (for rash). R-FLUTICASONE CR/KETOCONAZOLE CR--to underarms/groin area(s)    [provider]  famotidine  (PEPCID) 20 MG tablet Take 1 tablet (20 mg total) by mouth 2 (two) times daily. 09/08/17   Orson Eva, MD  hydrocortisone cream 1 % Apply 1 application topically 2 (two) times daily as needed for itching.    [provider]  Maltodextrin-Xanthan Gum (RESOURCE THICKENUP CLEAR) POWD Use with thin liquids to make nectar thickened consistency liquids 09/08/17   Tat, Shanon Brow, MD  mometasone (ELOCON) 0.1 % cream Apply 1 application topically 2 (two) times daily as needed (skin irritation).    [provider]  predniSONE (DELTASONE) 10 MG tablet Take 6 tablets (60 mg total) by mouth daily with breakfast. And decrease by one tablet every 2 days 09/10/17   Tat, Shanon Brow, MD  tamsulosin (FLOMAX) 0.4 MG CAPS capsule Take 0.4 mg by mouth daily. 04/12/16   [provider]  traZODone (DESYREL) 50 MG tablet Take 50 mg by mouth at bedtime.     [provider]  triamcinolone cream (KENALOG) 0.1 % Apply 1 application topically 2 (two) times daily.    [provider]    Family History Family History  Problem Relation Age of  Onset  . Pneumonia Father   . Heart disease Mother   . Diabetes Mother   . Stroke Mother   . Stroke Sister   . Breast cancer Sister        2 sisters  . Esophageal cancer Brother   . Colon cancer Neg Hx     Social History Social History   Tobacco Use  . Smoking status: Former Smoker    Packs/day: 1.50    Years: 45.00    Pack years: 67.50    Types: Cigarettes    Last attempt to quit: 12/09/2001    Years since quitting: 16.9  . Smokeless tobacco: Never Used  . Tobacco comment: quit about 9 years ago  Substance Use Topics  . Alcohol use: No    Alcohol/week: 0.0 standard drinks    Comment: Very rarely  . Drug use: No     Allergies   Codeine and Statins   Review of Systems Review of Systems  Unable to perform ROS: Dementia     Physical Exam Updated Vital Signs BP (!) 113/51   Pulse 67   Temp 99.6 F (37.6 C) (Oral)   Resp (!) 27    Wt 113 kg   SpO2 95%   BMI 36.79 kg/m   Physical Exam Vitals signs and nursing note reviewed.  Constitutional:      General: He is not in acute distress.    Appearance: Normal appearance. He is not toxic-appearing.  HENT:     Head: Normocephalic and atraumatic.     Mouth/Throat:     Mouth: Mucous membranes are moist.  Eyes:     Extraocular Movements: Extraocular movements intact.     Conjunctiva/sclera: Conjunctivae normal.     Pupils: Pupils are equal, round, and reactive to light.  Neck:     Musculoskeletal: Normal range of motion and neck supple.  Cardiovascular:     Rate and Rhythm: Normal rate and regular rhythm.  Pulmonary:     Effort: Pulmonary effort is normal.     Breath sounds: Wheezing present.     Comments: Faint wheezing Abdominal:     General: Bowel sounds are normal.     Tenderness: There is no abdominal tenderness.  Musculoskeletal: Normal range of motion.        General: No tenderness or deformity.  Skin:    General: Skin is warm.  Neurological:     General: No focal deficit present.     Mental Status: He is alert.      ED Treatments / Results  Labs (all labs ordered are listed, but only abnormal results are displayed) Labs Reviewed  CBC WITH DIFFERENTIAL/PLATELET - Abnormal; Notable for the following components:      Result Value   Platelets 141 (*)    Eosinophils Absolute 1.0 (*)    All other components within normal limits  COMPREHENSIVE METABOLIC PANEL - Abnormal; Notable for the following components:   Glucose, Bld 114 (*)    Calcium 8.1 (*)    Total Protein 6.3 (*)    Total Bilirubin 1.3 (*)    GFR calc non Af Amer 60 (*)    All other components within normal limits  LACTIC ACID, PLASMA  URINALYSIS, ROUTINE W REFLEX MICROSCOPIC    EKG EKG Interpretation  Date/Time:  Friday December 04 2018 08:12:30 EST Ventricular Rate:  74 PR Interval:    QRS Duration: 98 QT Interval:  399 QTC Calculation: 443 R Axis:   80 Text  Interpretation:  Sinus  rhythm Confirmed by [name] [phone]) on 12/04/2018 8:15:35 AM   Radiology Dg Chest 2 View  Result Date: 12/04/2018 CLINICAL DATA:  EMS reports pt has dementia and is a resident at [place]. Reports pt was incontinent of stool this morning and slipped in it and fell. EMS says staff reported his mental status was altered but another staff member stated his mental status was his baseline. Reports cough. Pt vomited x 1 with ems. Audible wheezing EXAM: CHEST - 2 VIEW COMPARISON:  11/08/2018 FINDINGS: There stable changes from prior CABG surgery. Cardiac silhouette is normal in size and configuration. No mediastinal or hilar masses. No evidence of adenopathy. There are prominent bronchovascular markings similar to prior studies. No evidence of pneumonia or pulmonary edema. No pleural effusion or pneumothorax. Skeletal structures are demineralized but intact. IMPRESSION: No acute cardiopulmonary disease. Electronically Signed   By: Lajean Manes M.D.   On: 12/04/2018 08:51   Ct Head Wo Contrast  Result Date: 12/04/2018 CLINICAL DATA:  Pain following fall.  Dementia EXAM: CT HEAD WITHOUT CONTRAST TECHNIQUE: Contiguous axial images were obtained from the base of the skull through the vertex without intravenous contrast. COMPARISON:  Head CT and brain MRI March 07, 2017 FINDINGS: Brain: Moderate diffuse atrophy is stable. There is no intracranial mass, hemorrhage, extra-axial fluid collection, or midline shift. There is small vessel disease throughout the centra semiovale bilaterally. There are lacunar type infarcts in both internal and external capsule regions. There are small lacunar type infarcts in the anterior left basal ganglia. There is a small lacunar infarct in the lateral left thalamus. No acute infarct is evident. Vascular: There is no hyperdense vessel. There is calcification in each distal vertebral artery and carotid siphon region. Skull: The bony calvarium appears  intact. Sinuses/Orbits: There is mucosal thickening and opacification in the maxillary antra, more severe on the right than on the left. There is opacification of multiple ethmoid air cells bilaterally as well as mucosal thickening in the right sphenoid sinus. There is expansion of opacification from the left maxillary antrum into the left nasal cavity. Orbits appear symmetric bilaterally. Other: Mastoid air cells are clear. IMPRESSION: Atrophy with extensive supratentorial small vessel disease. Small lacunar infarcts noted in the basal ganglia regions bilaterally as well as in the lateral left thalamus. No acute infarct. No mass or hemorrhage. Foci of arterial vascular calcification noted. Multifocal paranasal sinus disease. 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 Electronically Signed   By: Lowella Grip III M.D.   On: 12/04/2018 09:38    Procedures Procedures (including critical care time)  Medications Ordered in ED Medications  0.9 %  sodium chloride infusion ( Intravenous New Bag/Given 12/04/18 0958)  sodium chloride 0.9 % bolus 500 mL (0 mLs Intravenous Stopped 12/04/18 0850)     Initial Impression / Assessment and Plan / ED Course  I have reviewed the triage vital signs and the nursing notes.  Pertinent labs & imaging results that were available during my care of the patient were reviewed by me and considered in my medical decision making (see chart for details).     Work-up for the fall and the concern for the cough without any significant findings.  Patient stable to return back to nursing facility.  Patient oxygen sats on room air were 91 to 94%.  Chest x-ray was negative for pneumonia or any acute findings head CT was negative.  Patient's daughter is with him and states that he  is back to baseline.  Final Clinical Impressions(s) / ED Diagnoses   Final diagnoses:  Fall, initial encounter  Cough    ED  Discharge Orders    None       [name], MD 12/04/18 1047

## 2018-12-04 NOTE — Discharge Instructions (Signed)
Work-up following the fall without any acute findings.  Return as needed.  CT head negative chest x-ray without evidence of pneumonia.

## 2018-12-06 ENCOUNTER — Encounter (HOSPITAL_COMMUNITY): Payer: Self-pay | Admitting: Emergency Medicine

## 2018-12-06 ENCOUNTER — Other Ambulatory Visit: Payer: Self-pay

## 2018-12-06 ENCOUNTER — Emergency Department (HOSPITAL_COMMUNITY): Payer: Medicare Other

## 2018-12-06 ENCOUNTER — Emergency Department (HOSPITAL_COMMUNITY)
Admission: EM | Admit: 2018-12-06 | Discharge: 2018-12-06 | Disposition: A | Discharge status: Home / Self Care | Payer: Medicare Other | Attending: Emergency Medicine | Admitting: Emergency Medicine

## 2018-12-06 DIAGNOSIS — I4891 Unspecified atrial fibrillation: Secondary | ICD-10-CM | POA: Diagnosis not present

## 2018-12-06 DIAGNOSIS — I10 Essential (primary) hypertension: Secondary | ICD-10-CM | POA: Insufficient documentation

## 2018-12-06 DIAGNOSIS — Z79899 Other long term (current) drug therapy: Secondary | ICD-10-CM | POA: Diagnosis not present

## 2018-12-06 DIAGNOSIS — J441 Chronic obstructive pulmonary disease with (acute) exacerbation: Secondary | ICD-10-CM | POA: Diagnosis not present

## 2018-12-06 DIAGNOSIS — I2510 Atherosclerotic heart disease of native coronary artery without angina pectoris: Secondary | ICD-10-CM | POA: Diagnosis not present

## 2018-12-06 DIAGNOSIS — J690 Pneumonitis due to inhalation of food and vomit: Secondary | ICD-10-CM

## 2018-12-06 DIAGNOSIS — Z87891 Personal history of nicotine dependence: Secondary | ICD-10-CM | POA: Insufficient documentation

## 2018-12-06 DIAGNOSIS — R05 Cough: Secondary | ICD-10-CM | POA: Diagnosis not present

## 2018-12-06 DIAGNOSIS — G309 Alzheimer's disease, unspecified: Secondary | ICD-10-CM | POA: Insufficient documentation

## 2018-12-06 LAB — BASIC METABOLIC PANEL
Anion gap: 6 (ref 5–15)
BUN: 26 mg/dL — ABNORMAL HIGH (ref 8–23)
CALCIUM: 8.2 mg/dL — AB (ref 8.9–10.3)
CO2: 25 mmol/L (ref 22–32)
Chloride: 107 mmol/L (ref 98–111)
Creatinine, Ser: 1.1 mg/dL (ref 0.61–1.24)
GFR calc Af Amer: >60 mL/min (ref >60)
GFR calc non Af Amer: >60 mL/min (ref >60)
Glucose, Bld: 114 mg/dL — ABNORMAL HIGH (ref 70–99)
Potassium: 3.9 mmol/L (ref 3.5–5.1)
Sodium: 138 mmol/L (ref 135–145)

## 2018-12-06 LAB — CBC WITH DIFFERENTIAL/PLATELET
Abs Immature Granulocytes: 0.03 10*3/uL (ref 0.00–0.07)
Basophils Absolute: 0 10*3/uL (ref 0.0–0.1)
Basophils Relative: 1 %
Eosinophils Absolute: 0 10*3/uL (ref 0.0–0.5)
Eosinophils Relative: 0 %
HCT: 43.3 % (ref 39.0–52.0)
Hemoglobin: 13.6 g/dL (ref 13.0–17.0)
Immature Granulocytes: 0 %
Lymphocytes Relative: 19 %
Lymphs Abs: 1.4 10*3/uL (ref 0.7–4.0)
MCH: 27.6 pg (ref 26.0–34.0)
MCHC: 31.4 g/dL (ref 30.0–36.0)
MCV: 88 fL (ref 80.0–100.0)
MONOS PCT: 16 %
Monocytes Absolute: 1.2 10*3/uL — ABNORMAL HIGH (ref 0.1–1.0)
Neutro Abs: 4.8 10*3/uL (ref 1.7–7.7)
Neutrophils Relative %: 64 %
Platelets: 128 10*3/uL — ABNORMAL LOW (ref 150–400)
RBC: 4.92 MIL/uL (ref 4.22–5.81)
RDW: 13.2 % (ref 11.5–15.5)
WBC: 7.5 10*3/uL (ref 4.0–10.5)
nRBC: 0 % (ref 0.0–0.2)

## 2018-12-06 LAB — INFLUENZA PANEL BY PCR (TYPE A & B)
Influenza A By PCR: NEGATIVE
Influenza B By PCR: NEGATIVE

## 2018-12-06 MED ORDER — IPRATROPIUM-ALBUTEROL 0.5-2.5 (3) MG/3ML IN SOLN: 3.0000 mL | RESPIRATORY_TRACT | 1 refills | Status: DC | PRN

## 2018-12-06 MED ORDER — IPRATROPIUM-ALBUTEROL 0.5-2.5 (3) MG/3ML IN SOLN
3.0000 mL | Freq: Once | RESPIRATORY_TRACT | Status: AC
  Administered 2018-12-06: 3 mL via RESPIRATORY_TRACT
  Filled 2018-12-06: qty 3

## 2018-12-06 MED ORDER — AEROCHAMBER PLUS FLO-VU LARGE MISC: 1.0000 | Freq: Once | 0 refills | Status: AC

## 2018-12-06 MED ORDER — PREDNISONE 50 MG PO TABS
60.0000 mg | ORAL_TABLET | Freq: Once | ORAL | Status: AC
  Administered 2018-12-06: 60 mg via ORAL
  Filled 2018-12-06: qty 1

## 2018-12-06 MED ORDER — PREDNISONE 20 MG PO TABS: 20.0000 mg | ORAL_TABLET | Freq: Two times a day (BID) | ORAL | 0 refills | Status: DC

## 2018-12-06 MED ORDER — SODIUM CHLORIDE 0.9 % IV BOLUS
500.0000 mL | Freq: Once | INTRAVENOUS | Status: AC
  Administered 2018-12-06: 500 mL via INTRAVENOUS

## 2018-12-06 MED ORDER — ALBUTEROL SULFATE (2.5 MG/3ML) 0.083% IN NEBU
2.5000 mg | INHALATION_SOLUTION | Freq: Once | RESPIRATORY_TRACT | Status: AC
  Administered 2018-12-06: 2.5 mg via RESPIRATORY_TRACT
  Filled 2018-12-06: qty 3

## 2018-12-06 MED ORDER — AMOXICILLIN-POT CLAVULANATE 875-125 MG PO TABS: 1.0000 | ORAL_TABLET | Freq: Two times a day (BID) | ORAL | 0 refills | Status: DC

## 2018-12-06 NOTE — ED Notes (Signed)
Resp paged for breathing treatment.  

## 2018-12-06 NOTE — Discharge Instructions (Signed)
His symptoms are most likely related to exacerbation of COPD.  He may have a small aspiration pneumonia.  We are prescribing antibiotic, prednisone and a nebulizer to treat his symptoms.  Home health should be coming to his facility to help assess and treat him.

## 2018-12-06 NOTE — Care Management Note (Signed)
Case Management Note  Patient Details  Name: Tyler Mejia MRN: [id number] Date of Birth: 06-Sep-1936  Subjective/Objective:  RNCM talked with ED physcian at [facility] who states the patient has had issues obtaining home health in the past. From the documentation the patient has used [facility]. Referral placed with Jerm[facility] at [facility]. No DME needs. Will call to update family.                  Action/Plan:   Expected Discharge Date:                  Expected Discharge Plan:  [place]  In-House Referral:     Discharge planning Services  CM Consult  Post Acute Care Choice:    Choice offered to:  Patient  DME Arranged:    DME Agency:     HH Arranged:  PT, OT, Social Work [employer] Agency:  [place]  Status of Service:  Completed, signed off  If discussed at [employer], dates discussed:    Additional Comments:  Latanya Maudlin, RN 12/06/2018, 2:03 PM

## 2018-12-06 NOTE — ED Provider Notes (Signed)
[facility] EMERGENCY DEPARTMENT Provider Note   CSN: [id number] Arrival date & time: 12/06/18  1122     History   Chief Complaint Chief Complaint  Patient presents with  . Cough  . Fever    HPI Tyler Mejia is a 82 y.o. male.  HPI   He is here for evaluation of cough and fever which apparently started today.  He lives in an assisted living facility.  He is a DNR patient with a MOST form.  This essentially includes limited treatments and involvement of healthcare power of attorney.  Per patient and daughter who is here with him, he has POA, he has been gradually weaker over the last month.  I saw him about 4 weeks ago and referred him to dermatology which he has done.  Also requested home health services which were not initiated unfortunately.  He has been gradually weaker over the last month and fell 2 days ago, was evaluated here and discharged.  He feels he has had a cold for several months, but does not typically wheeze.  He is taking his usual medications, without relief.  He has been eating less than usual recently.  His daughter feels like he is gradually getting weaker.  There are no other known modifying factors.  Past Medical History:  Diagnosis Date  . Alzheimer's dementia ([place])   . Atrial fibrillation, transient ([place])    a. Post-op from CABG 08/2015.  Marland Kitchen CAD (coronary artery disease)    a. DES to the RCA March 2013 with residual dz rx medically. b. s/p CABGx4 in 08/2015.  . Collagen vascular disease ([place])   . COPD (chronic obstructive pulmonary disease) ([place])   . Dysphagia   . Essential hypertension   . GERD (gastroesophageal reflux disease)   . History of stroke    January 2017  . HLD (hyperlipidemia)    a. H/o multiple statin intolerances.  Marland Kitchen Hx of adenomatous colonic polyps   . Hyperlipidemia   . IBS (irritable bowel syndrome)   . Nephrolithiasis   . NSTEMI (non-ST elevated myocardial infarction) [facility]) March 2013  . Obesity   . Stroke [facility])     Patient  Active Problem List   Diagnosis Date Noted  . Sepsis due to undetermined organism ([place]) 09/04/2017  . Acute respiratory failure with hypoxia ([place]) 09/04/2017  . COPD with acute exacerbation ([place]) 09/04/2017  . Sepsis due to pneumonia ([place]) 09/03/2017  . Abnormal ECG 09/03/2017  . CAP (community acquired pneumonia) 09/03/2017  . Dementia ([place]) 05/21/2016  . Coronary artery disease involving coronary bypass graft of native heart with unspecified angina pectoris 02/29/2016  . Cerebrovascular accident (CVA) due to bilateral thrombosis of middle cerebral arteries ([place]) 02/29/2016  . CVA (cerebral infarction) 12/21/2015  . Altered mental status 12/21/2015  . Ileus, postoperative ([place]) 08/23/2015  . S/P CABG x 4 08/11/2015  . Obesity 08/10/2015  . Accelerating angina ([place]) 08/09/2015  . Mucosal abnormality of esophagus   . Hiatal hernia   . Dysphagia 03/15/2015  . Schatzki's ring 03/15/2015  . COPD (chronic obstructive pulmonary disease) ([place]) 08/21/2012  . Coronary artery disease involving native coronary artery 05/20/2012  . HLD (hyperlipidemia) 04/10/2012  . Shortness of breath 02/28/2012  . Essential hypertension 02/28/2012    Past Surgical History:  Procedure Laterality Date  . APPENDECTOMY    . BACK SURGERY    . CARDIAC CATHETERIZATION N/A 08/09/2015   Procedure: Left Heart Cath and Coronary Angiography;  Surgeon: Jettie Booze, MD;  Location:  [facility] INVASIVE CV LAB;  Service: Cardiovascular;  Laterality: N/A;  . COLONOSCOPY  08/21/2004   Pedunculated polyp at the splenic flexure  . COLONOSCOPY  09/2009   Dr. Rourk--> Cecal and hepatic flexure polyps, tubular adenomas. Next colonoscopy October 2013  . COLONOSCOPY N/A 05/07/2013   RMR: colonic polyp/colonic diverticulosis  . CORONARY ARTERY BYPASS GRAFT N/A 08/11/2015   Procedure: CORONARY ARTERY BYPASS GRAFTING (CABG) X4 UTILIZING THE LEFT INTERNAL MAMMARY ARTERY TO LAD AND ENDOSCOPICALLY HARVESTED BILATERAL SAPHENEOUS VEINS TO  DIAGONAL,OM1 AND OM2;  Surgeon: Ivin Poot, MD;  Location: [place];  Service: Open Heart Surgery;  Laterality: N/A;  . ESOPHAGOGASTRODUODENOSCOPY  2003   distal erosion/small hiatal hernia  . ESOPHAGOGASTRODUODENOSCOPY  09/2009   Dr. Evalee Mutton ring, mild distal esophageal erosions, status post Maloney dilation, small hiatal hernia.  Marland Kitchen ESOPHAGOGASTRODUODENOSCOPY N/A 03/16/2015   QIO:NGEXB HH/focal area of distal esophageal s/p bx  . LEFT HEART CATHETERIZATION WITH CORONARY ANGIOGRAM N/A 03/02/2012   Procedure: LEFT HEART CATHETERIZATION WITH CORONARY ANGIOGRAM;  Surgeon: Peter M Martinique, MD;  Location: [facility] CATH LAB;  Service: Cardiovascular;  Laterality: N/A;  . PERCUTANEOUS CORONARY STENT INTERVENTION (PCI-S) N/A 03/02/2012   Procedure: PERCUTANEOUS CORONARY STENT INTERVENTION (PCI-S);  Surgeon: Peter M Martinique, MD;  Location: [facility] CATH LAB;  Service: Cardiovascular;  Laterality: N/A;  . TEE WITHOUT CARDIOVERSION N/A 08/11/2015   Procedure: TRANSESOPHAGEAL ECHOCARDIOGRAM (TEE);  Surgeon: Ivin Poot, MD;  Location: [facility];  Service: Open Heart Surgery;  Laterality: N/A;        Home Medications    Prior to Admission medications   Medication Sig Start Date End Date Taking? Authorizing Provider  acetaminophen (TYLENOL) 325 MG tablet Take 2 tablets (650 mg total) by mouth every 6 (six) hours as needed for mild pain, fever or headache. Patient taking differently: Take 325 mg by mouth every 6 (six) hours as needed for mild pain or fever.  08/24/15  Yes Barrett, Erin R, PA-C  albuterol (PROVENTIL HFA;VENTOLIN HFA) 108 (90 Base) MCG/ACT inhaler Inhale 2 puffs into the lungs every 6 (six) hours as needed for wheezing or shortness of breath. 09/08/17  Yes Tat, Shanon Brow, MD  loperamide (IMODIUM) 2 MG capsule Take 2 mg by mouth daily as needed for diarrhea or loose stools.   Yes [provider]  sertraline (ZOLOFT) 25 MG tablet Take 25 mg by mouth daily.   Yes [provider]    tamsulosin (FLOMAX) 0.4 MG CAPS capsule Take 0.4 mg by mouth daily. 04/12/16  Yes [provider]  traZODone (DESYREL) 50 MG tablet Take 50 mg by mouth at bedtime.    Yes [provider]  amoxicillin-clavulanate (AUGMENTIN) 875-125 MG tablet Take 1 tablet by mouth 2 (two) times daily. One po bid x 7 days 12/06/18   Daleen Bo, MD  ipratropium-albuterol (DUONEB) 0.5-2.5 (3) MG/3ML SOLN Take 3 mLs by nebulization every 4 (four) hours as needed (Wheezing or shortness of breath). 12/06/18   Daleen Bo, MD  Maltodextrin-Xanthan Gum (Dustin) POWD Use with thin liquids to make nectar thickened consistency liquids Patient not taking: Reported on 12/06/2018 09/08/17   Tat, Shanon Brow, MD  predniSONE (DELTASONE) 20 MG tablet Take 1 tablet (20 mg total) by mouth 2 (two) times daily. 12/06/18   Daleen Bo, MD  Spacer/Aero-Holding Chambers (AEROCHAMBER PLUS FLO-VU LARGE) MISC 1 each by Other route once for 1 dose. Use with inhalers, to ensure proper dosing 12/06/18 12/06/18  Daleen Bo, MD    Family History Family History  Problem Relation Age of Onset  . Pneumonia Father   . Heart disease Mother   . Diabetes Mother   . Stroke Mother   . Stroke Sister   . Breast cancer Sister        2 sisters  . Esophageal cancer Brother   . Colon cancer Neg Hx     Social History Social History   Tobacco Use  . Smoking status: Former Smoker    Packs/day: 1.50    Years: 45.00    Pack years: 67.50    Types: Cigarettes    Last attempt to quit: 12/09/2001    Years since quitting: 17.0  . Smokeless tobacco: Never Used  . Tobacco comment: quit about 9 years ago  Substance Use Topics  . Alcohol use: No    Alcohol/week: 0.0 standard drinks    Comment: Very rarely  . Drug use: No     Allergies   Codeine and Statins   Review of Systems Review of Systems  All other systems reviewed and are negative.    Physical Exam Updated Vital Signs BP (!) 129/59    Pulse 73   Temp 99 F (37.2 C) (Rectal)   Resp (!) 22   Ht 5\' 9"  (1.753 m)   Wt 113 kg   SpO2 91%   BMI 36.79 kg/m   Physical Exam Vitals signs and nursing note reviewed.  Constitutional:      Appearance: Normal appearance. He is well-developed. He is not ill-appearing.     Comments: Frail, elderly  HENT:     Head: Normocephalic and atraumatic.     Right Ear: External ear normal.     Left Ear: External ear normal.  Eyes:     Conjunctiva/sclera: Conjunctivae normal.     Pupils: Pupils are equal, round, and reactive to light.  Neck:     Musculoskeletal: Normal range of motion and neck supple.     Trachea: Phonation normal.  Cardiovascular:     Rate and Rhythm: Normal rate and regular rhythm.     Heart sounds: Normal heart sounds.  Pulmonary:     Effort: Pulmonary effort is normal. No respiratory distress.     Breath sounds: No stridor. Wheezing and rhonchi present. No rales.     Comments: Decreased air movement bilaterally.  External wheezes audible.  There is no increased work of breathing. Chest:     Chest wall: No tenderness.  Abdominal:     Palpations: Abdomen is soft.     Tenderness: There is no abdominal tenderness.  Musculoskeletal: Normal range of motion.  Skin:    General: Skin is warm and dry.  Neurological:     Mental Status: He is alert.     Cranial Nerves: No cranial nerve deficit.     Sensory: No sensory deficit.     Motor: No abnormal muscle tone.     Coordination: Coordination normal.  Psychiatric:        Mood and Affect: Mood normal.        Behavior: Behavior normal.      ED Treatments / Results  Labs (all labs ordered are listed, but only abnormal results are displayed) Labs Reviewed  BASIC METABOLIC PANEL - Abnormal; Notable for the following components:      Result Value   Glucose, Bld 114 (*)    BUN 26 (*)    Calcium 8.2 (*)    All other components within normal limits  CBC WITH DIFFERENTIAL/PLATELET - Abnormal; Notable for the  following components:   Platelets 128 (*)    Monocytes Absolute 1.2 (*)    All other components within normal limits  INFLUENZA PANEL BY PCR (TYPE A & B)    EKG None  Radiology Dg Chest 2 View  Result Date: 12/06/2018 CLINICAL DATA:  Pt has been coughing and running a fever EXAM: CHEST - 2 VIEW COMPARISON:  12/04/2018 FINDINGS: Median sternotomy and CABG. The heart is enlarged. There is prominence of interstitial markings, some portion of which appears chronic. However in the RIGHT UPPER lobe, there is more focal opacity, consistent with infectious infiltrate. IMPRESSION: 1. Cardiomegaly. 2. RIGHT UPPER lobe infiltrate. Electronically Signed   By: Nolon Nations M.D.   On: 12/06/2018 14:15    Procedures Procedures (including critical care time)  Medications Ordered in ED Medications  ipratropium-albuterol (DUONEB) 0.5-2.5 (3) MG/3ML nebulizer solution 3 mL (3 mLs Nebulization Given 12/06/18 1327)  albuterol (PROVENTIL) (2.5 MG/3ML) 0.083% nebulizer solution 2.5 mg (2.5 mg Nebulization Given 12/06/18 1327)  sodium chloride 0.9 % bolus 500 mL (0 mLs Intravenous Stopped 12/06/18 1427)  predniSONE (DELTASONE) tablet 60 mg (60 mg Oral Given 12/06/18 1329)     Initial Impression / Assessment and Plan / ED Course  I have reviewed the triage vital signs and the nursing notes.  Pertinent labs & imaging results that were available during my care of the patient were reviewed by me and considered in my medical decision making (see chart for details).  Clinical Course as of Dec 07 1611  Sun Dec 06, 2018  1535 Normal  Influenza panel by PCR (type A & B) [EW]  1535 Normal  CBC with Differential(!) [EW]  1535 Normal except glucose high, BUN high, calcium low  Basic metabolic panel(!) [EW]  5176 Right upper lobe infiltrate, images reviewed by me  DG Chest 2 View [EW]    Clinical Course User Index [EW] Daleen Bo, MD     Patient Vitals for the past 24 hrs:  BP Temp Temp src  Pulse Resp SpO2 Height Weight  12/06/18 1600 (!) 129/59 - - 73 - 91 % - -  12/06/18 1506 132/61 - - 76 (!) 22 90 % - -  12/06/18 1405 130/68 - - 77 (!) 22 90 % - -  12/06/18 1327 - - - - - 92 % - -  12/06/18 1315 - 99 F (37.2 C) Rectal - - - - -  12/06/18 1300 135/62 - - 71 - 93 % - -  12/06/18 1204 - - - - - 93 % - -  12/06/18 1149 117/62 98.7 F (37.1 C) Oral 77 20 92 % 5\' 9"  (1.753 m) 113 kg    4:08 PM Reevaluation with update and discussion. After initial assessment and treatment, an updated evaluation reveals he is resting comfortably, findings discussed with the patient's daughter, POA.  All questions answered.Daleen Bo   Medical Decision Making: Evaluation most consistent with COPD exacerbation.  Possible small aspiration pneumonia right upper lobe.  Daughter relates that the patient sometimes chokes when he coughs.  Patient is not in respiratory distress.  He is a good candidate for outpatient treatment based on his history, and plans for intervention, as listed on his MOST form.  Home health assessment and intervention ordered.  I discussed the need for this with case management.  CRITICAL CARE-no Performed by: Daleen Bo  Nursing Notes Reviewed/ Care Coordinated Applicable Imaging Reviewed Interpretation of Laboratory Data incorporated into ED treatment  The patient appears reasonably  screened and/or stabilized for discharge and I doubt any other medical condition or other [facility] requiring further screening, evaluation, or treatment in the ED at this time prior to discharge.  Plan: Home Medications-continue usual home medications; Home Treatments-rest, fluids; return here if the recommended treatment, does not improve the symptoms; Recommended follow up-PCP, PRN   Final Clinical Impressions(s) / ED Diagnoses   Final diagnoses:  COPD exacerbation ([place])  Aspiration pneumonia of right upper lobe due to gastric secretions [facility])    ED Discharge Orders         [place]     12/06/18 1311    Face-to-face encounter (required for Medicare/Medicaid patients)    Comments:  I Daleen Bo certify that this patient is under my care and that I, or a nurse practitioner or physician's assistant working with me, had a face-to-face encounter that meets the physician face-to-face encounter requirements with this patient on 12/06/2018. The encounter with the patient was in whole, or in part for the following medical condition(s) which is the primary reason for home health care (List medical condition): Weakness   12/06/18 1311    amoxicillin-clavulanate (AUGMENTIN) 875-125 MG tablet  2 times daily     12/06/18 1605    predniSONE (DELTASONE) 20 MG tablet  2 times daily     12/06/18 1605    Spacer/Aero-Holding Chambers (AEROCHAMBER PLUS FLO-VU LARGE) MISC   Once     12/06/18 1605    ipratropium-albuterol (DUONEB) 0.5-2.5 (3) MG/3ML SOLN  Every 4 hours PRN     12/06/18 1605           Daleen Bo, MD 12/06/18 1612

## 2018-12-06 NOTE — ED Triage Notes (Signed)
Pt has been coughing and running a fever. He was given tylenol at 1030 this morning. He has congestion. He is having a lot more unsteady.

## 2018-12-10 DIAGNOSIS — G309 Alzheimer's disease, unspecified: Secondary | ICD-10-CM | POA: Diagnosis not present

## 2018-12-10 DIAGNOSIS — J690 Pneumonitis due to inhalation of food and vomit: Secondary | ICD-10-CM | POA: Diagnosis not present

## 2018-12-10 DIAGNOSIS — I25810 Atherosclerosis of coronary artery bypass graft(s) without angina pectoris: Secondary | ICD-10-CM | POA: Diagnosis not present

## 2018-12-10 DIAGNOSIS — F0280 Dementia in other diseases classified elsewhere without behavioral disturbance: Secondary | ICD-10-CM | POA: Diagnosis not present

## 2018-12-10 DIAGNOSIS — J440 Chronic obstructive pulmonary disease with acute lower respiratory infection: Secondary | ICD-10-CM | POA: Diagnosis not present

## 2018-12-10 DIAGNOSIS — W19XXXD Unspecified fall, subsequent encounter: Secondary | ICD-10-CM | POA: Diagnosis not present

## 2018-12-10 DIAGNOSIS — M6281 Muscle weakness (generalized): Secondary | ICD-10-CM | POA: Diagnosis not present

## 2018-12-10 DIAGNOSIS — J181 Lobar pneumonia, unspecified organism: Secondary | ICD-10-CM | POA: Diagnosis not present

## 2018-12-15 DIAGNOSIS — J449 Chronic obstructive pulmonary disease, unspecified: Secondary | ICD-10-CM | POA: Diagnosis not present

## 2018-12-15 DIAGNOSIS — J181 Lobar pneumonia, unspecified organism: Secondary | ICD-10-CM | POA: Diagnosis not present

## 2018-12-17 DIAGNOSIS — M79675 Pain in left toe(s): Secondary | ICD-10-CM | POA: Diagnosis not present

## 2018-12-17 DIAGNOSIS — M79674 Pain in right toe(s): Secondary | ICD-10-CM | POA: Diagnosis not present

## 2018-12-17 DIAGNOSIS — B351 Tinea unguium: Secondary | ICD-10-CM | POA: Diagnosis not present

## 2019-03-18 DIAGNOSIS — B351 Tinea unguium: Secondary | ICD-10-CM | POA: Diagnosis not present

## 2019-03-18 DIAGNOSIS — M79674 Pain in right toe(s): Secondary | ICD-10-CM | POA: Diagnosis not present

## 2019-03-18 DIAGNOSIS — M79675 Pain in left toe(s): Secondary | ICD-10-CM | POA: Diagnosis not present

## 2019-05-05 DIAGNOSIS — Z03818 Encounter for observation for suspected exposure to other biological agents ruled out: Secondary | ICD-10-CM | POA: Diagnosis not present

## 2019-06-03 DIAGNOSIS — M79674 Pain in right toe(s): Secondary | ICD-10-CM | POA: Diagnosis not present

## 2019-06-03 DIAGNOSIS — L600 Ingrowing nail: Secondary | ICD-10-CM | POA: Diagnosis not present

## 2019-06-03 DIAGNOSIS — M79675 Pain in left toe(s): Secondary | ICD-10-CM | POA: Diagnosis not present

## 2019-06-08 DIAGNOSIS — F0150 Vascular dementia without behavioral disturbance: Secondary | ICD-10-CM | POA: Diagnosis not present

## 2019-06-08 DIAGNOSIS — J449 Chronic obstructive pulmonary disease, unspecified: Secondary | ICD-10-CM | POA: Diagnosis not present

## 2019-08-11 DIAGNOSIS — B351 Tinea unguium: Secondary | ICD-10-CM | POA: Diagnosis not present

## 2019-08-11 DIAGNOSIS — M79674 Pain in right toe(s): Secondary | ICD-10-CM | POA: Diagnosis not present

## 2019-09-07 ENCOUNTER — Ambulatory Visit: Payer: Medicare Other | Admitting: Cardiology

## 2019-09-15 DIAGNOSIS — Z03818 Encounter for observation for suspected exposure to other biological agents ruled out: Secondary | ICD-10-CM | POA: Diagnosis not present

## 2019-09-22 DIAGNOSIS — Z03818 Encounter for observation for suspected exposure to other biological agents ruled out: Secondary | ICD-10-CM | POA: Diagnosis not present

## 2019-10-08 DIAGNOSIS — Z03818 Encounter for observation for suspected exposure to other biological agents ruled out: Secondary | ICD-10-CM | POA: Diagnosis not present

## 2019-10-13 DIAGNOSIS — Z03818 Encounter for observation for suspected exposure to other biological agents ruled out: Secondary | ICD-10-CM | POA: Diagnosis not present

## 2019-10-19 DIAGNOSIS — M79674 Pain in right toe(s): Secondary | ICD-10-CM | POA: Diagnosis not present

## 2019-10-19 DIAGNOSIS — M79675 Pain in left toe(s): Secondary | ICD-10-CM | POA: Diagnosis not present

## 2019-10-19 DIAGNOSIS — B351 Tinea unguium: Secondary | ICD-10-CM | POA: Diagnosis not present

## 2019-10-20 DIAGNOSIS — Z03818 Encounter for observation for suspected exposure to other biological agents ruled out: Secondary | ICD-10-CM | POA: Diagnosis not present

## 2019-10-28 ENCOUNTER — Emergency Department (HOSPITAL_COMMUNITY): Payer: Medicare Other

## 2019-10-28 ENCOUNTER — Emergency Department (HOSPITAL_COMMUNITY)
Admission: EM | Admit: 2019-10-28 | Discharge: 2019-10-29 | Disposition: A | Discharge status: Skilled Nursing Facility | Payer: Medicare Other | Attending: Emergency Medicine | Admitting: Emergency Medicine

## 2019-10-28 ENCOUNTER — Encounter (HOSPITAL_COMMUNITY): Payer: Self-pay

## 2019-10-28 ENCOUNTER — Other Ambulatory Visit: Payer: Self-pay

## 2019-10-28 DIAGNOSIS — G309 Alzheimer's disease, unspecified: Secondary | ICD-10-CM | POA: Insufficient documentation

## 2019-10-28 DIAGNOSIS — S60511A Abrasion of right hand, initial encounter: Secondary | ICD-10-CM | POA: Diagnosis not present

## 2019-10-28 DIAGNOSIS — I2510 Atherosclerotic heart disease of native coronary artery without angina pectoris: Secondary | ICD-10-CM | POA: Insufficient documentation

## 2019-10-28 DIAGNOSIS — I10 Essential (primary) hypertension: Secondary | ICD-10-CM | POA: Insufficient documentation

## 2019-10-28 DIAGNOSIS — S61411A Laceration without foreign body of right hand, initial encounter: Secondary | ICD-10-CM | POA: Insufficient documentation

## 2019-10-28 DIAGNOSIS — Z23 Encounter for immunization: Secondary | ICD-10-CM | POA: Diagnosis not present

## 2019-10-28 DIAGNOSIS — Z79899 Other long term (current) drug therapy: Secondary | ICD-10-CM | POA: Insufficient documentation

## 2019-10-28 DIAGNOSIS — S299XXA Unspecified injury of thorax, initial encounter: Secondary | ICD-10-CM | POA: Diagnosis not present

## 2019-10-28 DIAGNOSIS — Z87891 Personal history of nicotine dependence: Secondary | ICD-10-CM | POA: Diagnosis not present

## 2019-10-28 DIAGNOSIS — Y999 Unspecified external cause status: Secondary | ICD-10-CM | POA: Diagnosis not present

## 2019-10-28 DIAGNOSIS — Y939 Activity, unspecified: Secondary | ICD-10-CM | POA: Diagnosis not present

## 2019-10-28 DIAGNOSIS — J449 Chronic obstructive pulmonary disease, unspecified: Secondary | ICD-10-CM | POA: Insufficient documentation

## 2019-10-28 DIAGNOSIS — S022XXB Fracture of nasal bones, initial encounter for open fracture: Secondary | ICD-10-CM

## 2019-10-28 DIAGNOSIS — Y92129 Unspecified place in nursing home as the place of occurrence of the external cause: Secondary | ICD-10-CM | POA: Insufficient documentation

## 2019-10-28 DIAGNOSIS — W1839XA Other fall on same level, initial encounter: Secondary | ICD-10-CM | POA: Insufficient documentation

## 2019-10-28 DIAGNOSIS — S199XXA Unspecified injury of neck, initial encounter: Secondary | ICD-10-CM | POA: Diagnosis not present

## 2019-10-28 DIAGNOSIS — R58 Hemorrhage, not elsewhere classified: Secondary | ICD-10-CM | POA: Diagnosis not present

## 2019-10-28 DIAGNOSIS — S022XXA Fracture of nasal bones, initial encounter for closed fracture: Secondary | ICD-10-CM | POA: Diagnosis not present

## 2019-10-28 DIAGNOSIS — W19XXXA Unspecified fall, initial encounter: Secondary | ICD-10-CM | POA: Diagnosis not present

## 2019-10-28 DIAGNOSIS — S0003XA Contusion of scalp, initial encounter: Secondary | ICD-10-CM | POA: Diagnosis not present

## 2019-10-28 DIAGNOSIS — R0902 Hypoxemia: Secondary | ICD-10-CM | POA: Diagnosis not present

## 2019-10-28 DIAGNOSIS — S01511A Laceration without foreign body of lip, initial encounter: Secondary | ICD-10-CM | POA: Insufficient documentation

## 2019-10-28 DIAGNOSIS — S0993XA Unspecified injury of face, initial encounter: Secondary | ICD-10-CM | POA: Diagnosis present

## 2019-10-28 LAB — CBC WITH DIFFERENTIAL/PLATELET
Abs Immature Granulocytes: 0.07 10*3/uL (ref 0.00–0.07)
Basophils Absolute: 0.1 10*3/uL (ref 0.0–0.1)
Basophils Relative: 1 %
Eosinophils Absolute: 0.3 10*3/uL (ref 0.0–0.5)
Eosinophils Relative: 2 %
HCT: 44.6 % (ref 39.0–52.0)
Hemoglobin: 14.2 g/dL (ref 13.0–17.0)
Immature Granulocytes: 1 %
Lymphocytes Relative: 10 %
Lymphs Abs: 1.2 10*3/uL (ref 0.7–4.0)
MCH: 28.8 pg (ref 26.0–34.0)
MCHC: 31.8 g/dL (ref 30.0–36.0)
MCV: 90.5 fL (ref 80.0–100.0)
Monocytes Absolute: 0.9 10*3/uL (ref 0.1–1.0)
Monocytes Relative: 8 %
Neutro Abs: 9.2 10*3/uL — ABNORMAL HIGH (ref 1.7–7.7)
Neutrophils Relative %: 78 %
Platelets: 166 10*3/uL (ref 150–400)
RBC: 4.93 MIL/uL (ref 4.22–5.81)
RDW: 12.6 % (ref 11.5–15.5)
WBC: 11.7 10*3/uL — ABNORMAL HIGH (ref 4.0–10.5)
nRBC: 0 % (ref 0.0–0.2)

## 2019-10-28 LAB — BASIC METABOLIC PANEL
Anion gap: 7 (ref 5–15)
BUN: 24 mg/dL — ABNORMAL HIGH (ref 8–23)
CO2: 24 mmol/L (ref 22–32)
Calcium: 8.7 mg/dL — ABNORMAL LOW (ref 8.9–10.3)
Chloride: 104 mmol/L (ref 98–111)
Creatinine, Ser: 1.12 mg/dL (ref 0.61–1.24)
GFR calc Af Amer: >60 mL/min (ref >60)
GFR calc non Af Amer: >60 mL/min (ref >60)
Glucose, Bld: 139 mg/dL — ABNORMAL HIGH (ref 70–99)
Potassium: 4 mmol/L (ref 3.5–5.1)
Sodium: 135 mmol/L (ref 135–145)

## 2019-10-28 LAB — TROPONIN I (HIGH SENSITIVITY): Troponin I (High Sensitivity): 9 ng/L (ref <18)

## 2019-10-28 NOTE — ED Provider Notes (Signed)
[facility] EMERGENCY DEPARTMENT Provider Note   CSN: [id number] Arrival date & time: 10/28/19  2005     History   Chief Complaint Chief Complaint  Patient presents with   Fall    HPI Tyler Mejia is a 83 y.o. male.     HPI   Tyler Mejia is a 83 y.o. male with past medical history of coronary artery disease, dementia, previous CVA, hypertension who presents to the Emergency Department complaining of fall earlier today.  Per patient, he was at [facility] assisted living and was witnessed falling onto a plant landing in the floor.  Patient states that he does not know what caused him to fall.  He denies known syncopal event.  He complains of pain to his right hand and face.  He states pain is minimal.  He denies difficulty swallowing or breathing, chest pain, numbness or weakness of extremities headache or dizziness.  No neck or back pain.  States he does not take blood thinners.  Past Medical History:  Diagnosis Date   Alzheimer's dementia [facility])    Atrial fibrillation, transient ([place])    a. Post-op from CABG 08/2015.   CAD (coronary artery disease)    a. DES to the RCA March 2013 with residual dz rx medically. b. s/p CABGx4 in 08/2015.   Collagen vascular disease ([place])    COPD (chronic obstructive pulmonary disease) (HCC)    Dysphagia    Essential hypertension    GERD (gastroesophageal reflux disease)    History of stroke    January 2017   HLD (hyperlipidemia)    a. H/o multiple statin intolerances.   Hx of adenomatous colonic polyps    Hyperlipidemia    IBS (irritable bowel syndrome)    Nephrolithiasis    NSTEMI (non-ST elevated myocardial infarction) [facility]) March 2013   Obesity    Stroke [facility])     Patient Active Problem List   Diagnosis Date Noted   Sepsis due to undetermined organism ([employer]) 09/04/2017   Acute respiratory failure with hypoxia ([place]) 09/04/2017   COPD with acute exacerbation ([place]) 09/04/2017   Sepsis due to pneumonia ([place])  09/03/2017   Abnormal ECG 09/03/2017   CAP (community acquired pneumonia) 09/03/2017   Dementia ([place]) 05/21/2016   Coronary artery disease involving coronary bypass graft of native heart with unspecified angina pectoris 02/29/2016   Cerebrovascular accident (CVA) due to bilateral thrombosis of middle cerebral arteries ([place]) 02/29/2016   CVA (cerebral infarction) 12/21/2015   Altered mental status 12/21/2015   Ileus, postoperative ([place]) 08/23/2015   S/P CABG x 4 08/11/2015   Obesity 08/10/2015   Accelerating angina ([place]) 08/09/2015   Mucosal abnormality of esophagus    Hiatal hernia    Dysphagia 03/15/2015   Schatzki's ring 03/15/2015   COPD (chronic obstructive pulmonary disease) ([place]) 08/21/2012   Coronary artery disease involving native coronary artery 05/20/2012   HLD (hyperlipidemia) 04/10/2012   Shortness of breath 02/28/2012   Essential hypertension 02/28/2012    Past Surgical History:  Procedure Laterality Date   APPENDECTOMY     BACK SURGERY     CARDIAC CATHETERIZATION N/A 08/09/2015   Procedure: Left Heart Cath and Coronary Angiography;  Surgeon: Jettie Booze, MD;  Location: [place] CV LAB;  Service: Cardiovascular;  Laterality: N/A;   COLONOSCOPY  08/21/2004   Pedunculated polyp at the splenic flexure   COLONOSCOPY  09/2009   Dr. Rourk--> Cecal and hepatic flexure polyps, tubular adenomas. Next colonoscopy October 2013   COLONOSCOPY N/A 05/07/2013  RMR: colonic polyp/colonic diverticulosis   CORONARY ARTERY BYPASS GRAFT N/A 08/11/2015   Procedure: CORONARY ARTERY BYPASS GRAFTING (CABG) X4 UTILIZING THE LEFT INTERNAL MAMMARY ARTERY TO LAD AND ENDOSCOPICALLY HARVESTED BILATERAL SAPHENEOUS VEINS TO DIAGONAL,OM1 AND OM2;  Surgeon: Ivin Poot, MD;  Location: [place];  Service: Open Heart Surgery;  Laterality: N/A;   ESOPHAGOGASTRODUODENOSCOPY  2003   distal erosion/small hiatal hernia   ESOPHAGOGASTRODUODENOSCOPY  09/2009   Dr.  Evalee Mutton ring, mild distal esophageal erosions, status post Maloney dilation, small hiatal hernia.   ESOPHAGOGASTRODUODENOSCOPY N/A 03/16/2015   [id number] HH/focal area of distal esophageal s/p bx   LEFT HEART CATHETERIZATION WITH CORONARY ANGIOGRAM N/A 03/02/2012   Procedure: LEFT HEART CATHETERIZATION WITH CORONARY ANGIOGRAM;  Surgeon: Peter M Martinique, MD;  Location: [facility] CATH LAB;  Service: Cardiovascular;  Laterality: N/A;   PERCUTANEOUS CORONARY STENT INTERVENTION (PCI-S) N/A 03/02/2012   Procedure: PERCUTANEOUS CORONARY STENT INTERVENTION (PCI-S);  Surgeon: Peter M Martinique, MD;  Location: [facility] CATH LAB;  Service: Cardiovascular;  Laterality: N/A;   TEE WITHOUT CARDIOVERSION N/A 08/11/2015   Procedure: TRANSESOPHAGEAL ECHOCARDIOGRAM (TEE);  Surgeon: Ivin Poot, MD;  Location: [place];  Service: Open Heart Surgery;  Laterality: N/A;        Home Medications    Prior to Admission medications   Medication Sig Start Date End Date Taking? Authorizing Provider  acetaminophen (TYLENOL) 325 MG tablet Take 2 tablets (650 mg total) by mouth every 6 (six) hours as needed for mild pain, fever or headache. Patient taking differently: Take 325 mg by mouth every 6 (six) hours as needed for mild pain or fever.  08/24/15   Barrett, Erin R, PA-C  albuterol (PROVENTIL HFA;VENTOLIN HFA) 108 (90 Base) MCG/ACT inhaler Inhale 2 puffs into the lungs every 6 (six) hours as needed for wheezing or shortness of breath. 09/08/17   Orson Eva, MD  amoxicillin-clavulanate (AUGMENTIN) 875-125 MG tablet Take 1 tablet by mouth 2 (two) times daily. One po bid x 7 days 12/06/18   Daleen Bo, MD  ipratropium-albuterol (DUONEB) 0.5-2.5 (3) MG/3ML SOLN Take 3 mLs by nebulization every 4 (four) hours as needed (Wheezing or shortness of breath). 12/06/18   Daleen Bo, MD  loperamide (IMODIUM) 2 MG capsule Take 2 mg by mouth daily as needed for diarrhea or loose stools.    [provider]  Maltodextrin-Xanthan  Gum ([place]) POWD Use with thin liquids to make nectar thickened consistency liquids Patient not taking: Reported on 12/06/2018 09/08/17   Tat, Shanon Brow, MD  predniSONE (DELTASONE) 20 MG tablet Take 1 tablet (20 mg total) by mouth 2 (two) times daily. 12/06/18   Daleen Bo, MD  sertraline (ZOLOFT) 25 MG tablet Take 25 mg by mouth daily.    [provider]  tamsulosin (FLOMAX) 0.4 MG CAPS capsule Take 0.4 mg by mouth daily. 04/12/16   [provider]  traZODone (DESYREL) 50 MG tablet Take 50 mg by mouth at bedtime.     [provider]    Family History Family History  Problem Relation Age of Onset   Pneumonia Father    Heart disease Mother    Diabetes Mother    Stroke Mother    Stroke Sister    Breast cancer Sister        2 sisters   Esophageal cancer Brother    Colon cancer Neg Hx     Social History Social History   Tobacco Use   Smoking status: Former Smoker    Packs/day: 1.50  Years: 45.00    Pack years: 67.50    Types: Cigarettes    Quit date: 12/09/2001    Years since quitting: 17.8   Smokeless tobacco: Never Used   Tobacco comment: quit about 9 years ago  Substance Use Topics   Alcohol use: No    Alcohol/week: 0.0 standard drinks    Comment: Very rarely   Drug use: No     Allergies   Codeine and Statins   Review of Systems Review of Systems  Constitutional: Negative for chills and fever.  Eyes: Negative for visual disturbance.  Respiratory: Negative for chest tightness and shortness of breath.   Cardiovascular: Negative for chest pain.  Gastrointestinal: Negative for abdominal pain, nausea and vomiting.  Musculoskeletal: Positive for arthralgias and joint swelling.  Skin: Positive for wound. Negative for color change.       Laceration right hand and nose.  Neurological: Positive for syncope. Negative for dizziness, facial asymmetry, speech difficulty, weakness, light-headedness and numbness.      Physical Exam Updated Vital Signs BP (!) 154/67 (BP Location: Left Arm)    Pulse 84    Temp 98.3 F (36.8 C) (Oral)    Resp 18    SpO2 95%   Physical Exam Vitals signs and nursing note reviewed.  Constitutional:      General: He is not in acute distress.    Appearance: He is not toxic-appearing.  HENT:     Head:     Comments: Laceration to the bridge of the nose and right upper lip.  Mild edema noted to the nose.  Dried blood to the left nare.  No active epistaxis.  No palpable septal hematoma.  No dental injury or malocclusion.    Mouth/Throat:     Mouth: Mucous membranes are moist.  Eyes:     Extraocular Movements: Extraocular movements intact.     Conjunctiva/sclera: Conjunctivae normal.     Pupils: Pupils are equal, round, and reactive to light.  Neck:     Comments: Hard c-collar applied by EMS.  No cervical tenderness on exam. Cardiovascular:     Rate and Rhythm: Normal rate and regular rhythm.     Pulses: Normal pulses.  Pulmonary:     Effort: Pulmonary effort is normal. No respiratory distress.     Breath sounds: Normal breath sounds.  Chest:     Chest wall: No tenderness.  Abdominal:     General: There is no distension.     Palpations: Abdomen is soft. There is no mass.     Tenderness: There is no abdominal tenderness. There is no guarding.  Musculoskeletal: Normal range of motion.        General: Signs of injury present. No swelling.     Comments: Laceration right dorsal hand.  Bleeding controlled.  No edema.  Skin:    General: Skin is warm.     Capillary Refill: Capillary refill takes less than 2 seconds.  Neurological:     General: No focal deficit present.     Sensory: No sensory deficit.     Motor: No weakness.      ED Treatments / Results  Labs (all labs ordered are listed, but only abnormal results are displayed) Labs Reviewed  BASIC METABOLIC PANEL - Abnormal; Notable for the following components:      Result Value   Glucose, Bld 139 (*)     BUN 24 (*)    Calcium 8.7 (*)    All other components within normal limits  CBC WITH DIFFERENTIAL/PLATELET - Abnormal; Notable for the following components:   WBC 11.7 (*)    Neutro Abs 9.2 (*)    All other components within normal limits  URINALYSIS, ROUTINE W REFLEX MICROSCOPIC  TROPONIN I (HIGH SENSITIVITY)  TROPONIN I (HIGH SENSITIVITY)    EKG EKG Interpretation  Date/Time:  Thursday October 28 2019 21:37:10 EST Ventricular Rate:  71 PR Interval:    QRS Duration: 98 QT Interval:  396 QTC Calculation: 431 R Axis:   81 Text Interpretation: Ectopic atrial rhythm Borderline right axis deviation Confirmed by Ripley Fraise [phone]) on 10/28/2019 11:37:41 PM   Radiology Ct Head Wo Contrast  Result Date: 10/28/2019 CLINICAL DATA:  Unwitnessed fall with facial trauma unknown cause found down EXAM: CT HEAD WITHOUT CONTRAST CT MAXILLOFACIAL WITHOUT CONTRAST CT CERVICAL SPINE WITHOUT CONTRAST TECHNIQUE: Multidetector CT imaging of the head, cervical spine, and maxillofacial structures were performed using the standard protocol without intravenous contrast. Multiplanar CT image reconstructions of the cervical spine and maxillofacial structures were also generated. COMPARISON:  CT head 12/04/2018, MRI 03/07/2017 FINDINGS: CT HEAD FINDINGS Brain: Stable regions of gliosis in the bilateral basal ganglia, more extensive on the left. No evidence of acute infarction, hemorrhage, hydrocephalus, extra-axial collection or mass lesion/mass effect. Prominence of the ventricles, cisterns and sulci compatible with parenchymal volume loss. Asymmetry of the ventricles is similar to priors and may be secondary to the asymmetrically increased gliotic changes this hemisphere. Confluent areas of white matter hypoattenuation are most compatible with moderate to severe chronic microvascular angiopathy. Vascular: Atherosclerotic calcification of the carotid siphons and intradural vertebral arteries. No  hyperdense vessel. Skull: Mild right frontotemporal scalp thickening and contusion. No subjacent hematoma. No calvarial fractures are identified. Other: None CT MAXILLOFACIAL FINDINGS Osseous: No fracture of the bony orbits. There are comminuted fractures of the bilateral nasal bones with extension into the frontal processes of the left maxilla. Minimally displaced, comminuted fracture of the anterior septum (8/21) with abrupt angulation is noted as well. There is a questionable fracture through the base of the nasal spines (8/36). No additional mid face fractures are seen. The pterygoid plates are intact. The mandible is intact. Temporomandibular joints are normally aligned. Mild temporomandibular joint osteoarthrosis. No temporal bone fractures are identified. Questionable chip fractures of the bilateral mandibular central incisors, right lateral mandibular incisor and the left maxillary central and lateral incisors. Orbits: No retro septal gas or fat stranding. The globes appear normal and symmetric. Symmetric appearance of the extraocular musculature and optic nerve sheath complexes. Normal caliber of the superior ophthalmic veins. Sinuses: Mild mural thickening within the maxillary sinuses and ethmoids with some pneumatized high attenuation fluid in the left maxillary sinus possibly reflecting some hemosinus related to the nasal fractures. Soft tissues: Soft tissue swelling of the face most predominantly across the nasal bridge and medial malar tissues as well as across the upper and lower lips and pre mandibular tissues. No soft tissue gas or foreign body. CT CERVICAL SPINE FINDINGS Alignment: Slight reversal of the normal lower cervical lordotic curvature. Mild anterolisthesis C5 on C6 by approximately 3 mm is on a likely degenerative basis given associated facet hypertrophic and changes and uncinate spurring. Skull base and vertebrae: No acute fracture. No primary bone lesion or focal pathologic process.  Partially fused C3-4 vertebral bodies with additional complete fusion of the left articular facets and partial fusion of the right articular facets. No acute or suspicious osseous lesions. Mild calcified pannus formation at the dens. Soft tissues and spinal canal:  No pre or paravertebral fluid or swelling. No visible canal hematoma. Some mineralization is seen along the posterior longitudinal ligament. Disc levels: Multilevel cervical spondylitic changes with intervertebral disc height loss are present. Ridging at the C3-4 level results in moderate canal stenosis and severe left neural foraminal narrowing. Additional severe neural foraminal narrowing noted on the left at C4-5 as well. More diffuse facet degenerative changes result in mild to moderate multilevel foraminal narrowing elsewhere throughout the cervical spine. Scattered carious lesions and periapical lucencies with orthodontic hardware. Upper chest: No acute abnormality in the upper chest or imaged lung apices. Mild apical pleuroparenchymal scarring and a background emphysema. Other: Normal thyroid. Cervical carotid atherosclerosis. IMPRESSION: 1. No acute intracranial abnormality. Mild right frontotemporal scalp thickening and contusion. 2. Comminuted fractures of the bilateral nasal bones with extension into the frontal processes of the left maxilla. Minimally displaced, comminuted fracture of the anterior septum with abrupt angulation. Questionable fracture through the base of the nasal spines 3. Questionable chip fractures of the bilateral mandibular central incisors, right lateral mandibular incisor and the left maxillary central and lateral incisors. Correlate with visual dental inspection. Additional periodontal disease and orthodontic prostheses are noted. 4. Soft tissue swelling of the face most predominantly across the nasal bridge and medial malar tissues, as well as across the upper and lower lips, and pre mandibular tissues. 5. No acute  cervical spine fracture or traumatic listhesis. 6. Multilevel cervical spondylitic changes as described above. 7. Cervical and intracranial atherosclerosis. 8.  Emphysema (ICD10-J43.9). Electronically Signed   By: Lovena Le M.D.   On: 10/28/2019 22:44   Ct Cervical Spine Wo Contrast  Result Date: 10/28/2019 CLINICAL DATA:  Unwitnessed fall with facial trauma unknown cause found down EXAM: CT HEAD WITHOUT CONTRAST CT MAXILLOFACIAL WITHOUT CONTRAST CT CERVICAL SPINE WITHOUT CONTRAST TECHNIQUE: Multidetector CT imaging of the head, cervical spine, and maxillofacial structures were performed using the standard protocol without intravenous contrast. Multiplanar CT image reconstructions of the cervical spine and maxillofacial structures were also generated. COMPARISON:  CT head 12/04/2018, MRI 03/07/2017 FINDINGS: CT HEAD FINDINGS Brain: Stable regions of gliosis in the bilateral basal ganglia, more extensive on the left. No evidence of acute infarction, hemorrhage, hydrocephalus, extra-axial collection or mass lesion/mass effect. Prominence of the ventricles, cisterns and sulci compatible with parenchymal volume loss. Asymmetry of the ventricles is similar to priors and may be secondary to the asymmetrically increased gliotic changes this hemisphere. Confluent areas of white matter hypoattenuation are most compatible with moderate to severe chronic microvascular angiopathy. Vascular: Atherosclerotic calcification of the carotid siphons and intradural vertebral arteries. No hyperdense vessel. Skull: Mild right frontotemporal scalp thickening and contusion. No subjacent hematoma. No calvarial fractures are identified. Other: None CT MAXILLOFACIAL FINDINGS Osseous: No fracture of the bony orbits. There are comminuted fractures of the bilateral nasal bones with extension into the frontal processes of the left maxilla. Minimally displaced, comminuted fracture of the anterior septum (8/21) with abrupt angulation is  noted as well. There is a questionable fracture through the base of the nasal spines (8/36). No additional mid face fractures are seen. The pterygoid plates are intact. The mandible is intact. Temporomandibular joints are normally aligned. Mild temporomandibular joint osteoarthrosis. No temporal bone fractures are identified. Questionable chip fractures of the bilateral mandibular central incisors, right lateral mandibular incisor and the left maxillary central and lateral incisors. Orbits: No retro septal gas or fat stranding. The globes appear normal and symmetric. Symmetric appearance of the extraocular musculature and optic nerve sheath complexes.  Normal caliber of the superior ophthalmic veins. Sinuses: Mild mural thickening within the maxillary sinuses and ethmoids with some pneumatized high attenuation fluid in the left maxillary sinus possibly reflecting some hemosinus related to the nasal fractures. Soft tissues: Soft tissue swelling of the face most predominantly across the nasal bridge and medial malar tissues as well as across the upper and lower lips and pre mandibular tissues. No soft tissue gas or foreign body. CT CERVICAL SPINE FINDINGS Alignment: Slight reversal of the normal lower cervical lordotic curvature. Mild anterolisthesis C5 on C6 by approximately 3 mm is on a likely degenerative basis given associated facet hypertrophic and changes and uncinate spurring. Skull base and vertebrae: No acute fracture. No primary bone lesion or focal pathologic process. Partially fused C3-4 vertebral bodies with additional complete fusion of the left articular facets and partial fusion of the right articular facets. No acute or suspicious osseous lesions. Mild calcified pannus formation at the dens. Soft tissues and spinal canal: No pre or paravertebral fluid or swelling. No visible canal hematoma. Some mineralization is seen along the posterior longitudinal ligament. Disc levels: Multilevel cervical  spondylitic changes with intervertebral disc height loss are present. Ridging at the C3-4 level results in moderate canal stenosis and severe left neural foraminal narrowing. Additional severe neural foraminal narrowing noted on the left at C4-5 as well. More diffuse facet degenerative changes result in mild to moderate multilevel foraminal narrowing elsewhere throughout the cervical spine. Scattered carious lesions and periapical lucencies with orthodontic hardware. Upper chest: No acute abnormality in the upper chest or imaged lung apices. Mild apical pleuroparenchymal scarring and a background emphysema. Other: Normal thyroid. Cervical carotid atherosclerosis. IMPRESSION: 1. No acute intracranial abnormality. Mild right frontotemporal scalp thickening and contusion. 2. Comminuted fractures of the bilateral nasal bones with extension into the frontal processes of the left maxilla. Minimally displaced, comminuted fracture of the anterior septum with abrupt angulation. Questionable fracture through the base of the nasal spines 3. Questionable chip fractures of the bilateral mandibular central incisors, right lateral mandibular incisor and the left maxillary central and lateral incisors. Correlate with visual dental inspection. Additional periodontal disease and orthodontic prostheses are noted. 4. Soft tissue swelling of the face most predominantly across the nasal bridge and medial malar tissues, as well as across the upper and lower lips, and pre mandibular tissues. 5. No acute cervical spine fracture or traumatic listhesis. 6. Multilevel cervical spondylitic changes as described above. 7. Cervical and intracranial atherosclerosis. 8.  Emphysema (ICD10-J43.9). Electronically Signed   By: Lovena Le M.D.   On: 10/28/2019 22:44   Dg Chest Portable 1 View  Result Date: 10/28/2019 CLINICAL DATA:  Possible syncope. Fall earlier today. EXAM: PORTABLE CHEST 1 VIEW COMPARISON:  Chest radiograph dated 12/06/2018  FINDINGS: The heart is borderline enlarged. Vascular calcifications are seen in the aortic arch. Both lungs are clear. The visualized skeletal structures are unremarkable. Median sternotomy wires are unchanged. IMPRESSION: Borderline cardiomegaly.  No acute pulmonary disease. Electronically Signed   By: Zerita Boers M.D.   On: 10/28/2019 21:41   Dg Hand Complete Right  Result Date: 10/28/2019 CLINICAL DATA:  Fall.  Abrasions to hand EXAM: RIGHT HAND - COMPLETE 3+ VIEW COMPARISON:  None. FINDINGS: No acute bony abnormality. Specifically, no fracture, subluxation, or dislocation. Osteoarthritic changes within the IP joints diffusely, most pronounced in the DIP joints. No bony erosions. No radiopaque foreign body. IMPRESSION: No acute bony abnormality. Electronically Signed   By: Rolm Baptise M.D.   On: 10/28/2019  22:53   Ct Maxillofacial Wo Contrast  Result Date: 10/28/2019 CLINICAL DATA:  Unwitnessed fall with facial trauma unknown cause found down EXAM: CT HEAD WITHOUT CONTRAST CT MAXILLOFACIAL WITHOUT CONTRAST CT CERVICAL SPINE WITHOUT CONTRAST TECHNIQUE: Multidetector CT imaging of the head, cervical spine, and maxillofacial structures were performed using the standard protocol without intravenous contrast. Multiplanar CT image reconstructions of the cervical spine and maxillofacial structures were also generated. COMPARISON:  CT head 12/04/2018, MRI 03/07/2017 FINDINGS: CT HEAD FINDINGS Brain: Stable regions of gliosis in the bilateral basal ganglia, more extensive on the left. No evidence of acute infarction, hemorrhage, hydrocephalus, extra-axial collection or mass lesion/mass effect. Prominence of the ventricles, cisterns and sulci compatible with parenchymal volume loss. Asymmetry of the ventricles is similar to priors and may be secondary to the asymmetrically increased gliotic changes this hemisphere. Confluent areas of white matter hypoattenuation are most compatible with moderate to severe  chronic microvascular angiopathy. Vascular: Atherosclerotic calcification of the carotid siphons and intradural vertebral arteries. No hyperdense vessel. Skull: Mild right frontotemporal scalp thickening and contusion. No subjacent hematoma. No calvarial fractures are identified. Other: None CT MAXILLOFACIAL FINDINGS Osseous: No fracture of the bony orbits. There are comminuted fractures of the bilateral nasal bones with extension into the frontal processes of the left maxilla. Minimally displaced, comminuted fracture of the anterior septum (8/21) with abrupt angulation is noted as well. There is a questionable fracture through the base of the nasal spines (8/36). No additional mid face fractures are seen. The pterygoid plates are intact. The mandible is intact. Temporomandibular joints are normally aligned. Mild temporomandibular joint osteoarthrosis. No temporal bone fractures are identified. Questionable chip fractures of the bilateral mandibular central incisors, right lateral mandibular incisor and the left maxillary central and lateral incisors. Orbits: No retro septal gas or fat stranding. The globes appear normal and symmetric. Symmetric appearance of the extraocular musculature and optic nerve sheath complexes. Normal caliber of the superior ophthalmic veins. Sinuses: Mild mural thickening within the maxillary sinuses and ethmoids with some pneumatized high attenuation fluid in the left maxillary sinus possibly reflecting some hemosinus related to the nasal fractures. Soft tissues: Soft tissue swelling of the face most predominantly across the nasal bridge and medial malar tissues as well as across the upper and lower lips and pre mandibular tissues. No soft tissue gas or foreign body. CT CERVICAL SPINE FINDINGS Alignment: Slight reversal of the normal lower cervical lordotic curvature. Mild anterolisthesis C5 on C6 by approximately 3 mm is on a likely degenerative basis given associated facet hypertrophic  and changes and uncinate spurring. Skull base and vertebrae: No acute fracture. No primary bone lesion or focal pathologic process. Partially fused C3-4 vertebral bodies with additional complete fusion of the left articular facets and partial fusion of the right articular facets. No acute or suspicious osseous lesions. Mild calcified pannus formation at the dens. Soft tissues and spinal canal: No pre or paravertebral fluid or swelling. No visible canal hematoma. Some mineralization is seen along the posterior longitudinal ligament. Disc levels: Multilevel cervical spondylitic changes with intervertebral disc height loss are present. Ridging at the C3-4 level results in moderate canal stenosis and severe left neural foraminal narrowing. Additional severe neural foraminal narrowing noted on the left at C4-5 as well. More diffuse facet degenerative changes result in mild to moderate multilevel foraminal narrowing elsewhere throughout the cervical spine. Scattered carious lesions and periapical lucencies with orthodontic hardware. Upper chest: No acute abnormality in the upper chest or imaged lung apices. Mild apical  pleuroparenchymal scarring and a background emphysema. Other: Normal thyroid. Cervical carotid atherosclerosis. IMPRESSION: 1. No acute intracranial abnormality. Mild right frontotemporal scalp thickening and contusion. 2. Comminuted fractures of the bilateral nasal bones with extension into the frontal processes of the left maxilla. Minimally displaced, comminuted fracture of the anterior septum with abrupt angulation. Questionable fracture through the base of the nasal spines 3. Questionable chip fractures of the bilateral mandibular central incisors, right lateral mandibular incisor and the left maxillary central and lateral incisors. Correlate with visual dental inspection. Additional periodontal disease and orthodontic prostheses are noted. 4. Soft tissue swelling of the face most predominantly across  the nasal bridge and medial malar tissues, as well as across the upper and lower lips, and pre mandibular tissues. 5. No acute cervical spine fracture or traumatic listhesis. 6. Multilevel cervical spondylitic changes as described above. 7. Cervical and intracranial atherosclerosis. 8.  Emphysema (ICD10-J43.9). Electronically Signed   By: Lovena Le M.D.   On: 10/28/2019 22:44    Procedures Procedures (including critical care time)  LACERATION REPAIR #1 Performed by: Kathlen Sakurai Authorized by: Guage Efferson Consent: Verbal consent obtained. Risks and benefits: risks, benefits and alternatives were discussed Consent given by: patient Patient identity confirmed: provided demographic data Prepped and Draped in normal sterile fashion Wound explored  Laceration Location: right upper lip  Laceration Length: 2.5 cm  No Foreign Bodies seen or palpated  Anesthesia: local infiltration  Local anesthetic: lidocaine 1% w/o epinephrine  Anesthetic total: 2 ml  Irrigation method: syringe Amount of cleaning: standard  Skin closure: 5-0 vicryl  Number of sutures: 4  Technique: simple interrupted  Patient tolerance: Patient tolerated the procedure well with no immediate complications.   LACERATION REPAIR # 2 Performed by: Angelyna Henderson Authorized by: Constantina Laseter Consent: Verbal consent obtained. Risks and benefits: risks, benefits and alternatives were discussed Consent given by: patient Patient identity confirmed: provided demographic data Prepped and Draped in normal sterile fashion Wound explored  Laceration Location: bridge of the nose  Laceration Length: 1 cm  No Foreign Bodies seen or palpated  Anesthesia: local infiltration  Local anesthetic: lidocaine 1 % w/o epinephrine  Anesthetic total: 1 ml  Irrigation method: syringe Amount of cleaning: Thorough irrigation  Skin closure: 5-0 Vicryl  Number of sutures: 3  Technique: Simple  interrupted  Patient tolerance: Patient tolerated the procedure well with no immediate complications.   LACERATION REPAIR #3 Performed by: Allura Doepke Authorized by: Ravynn Hogate Consent: Verbal consent obtained. Risks and benefits: risks, benefits and alternatives were discussed Consent given by: patient Patient identity confirmed: provided demographic data Prepped and Draped in normal sterile fashion Wound explored  Laceration Location: Right hand  Laceration Length: 5 cm  No Foreign Bodies seen or palpated  Anesthesia: local infiltration  Local anesthetic: lidocaine 1% without epinephrine  Anesthetic total: 3 ml  Irrigation method: syringe Amount of cleaning: standard  Skin closure: 4-0 Prolene  Number of sutures: 9  Technique: Simple interrupted  Patient tolerance: Patient tolerated the procedure well with no immediate complications.   Medications Ordered in ED Medications - No data to display   Initial Impression / Assessment and Plan / ED Course  I have reviewed the triage vital signs and the nursing notes.  Pertinent labs & imaging results that were available during my care of the patient were reviewed by me and considered in my medical decision making (see chart for details).       Cervical collar removed by me after review of CT  findings.  Patient resting comfortably, vitals reviewed.  Laceration of the bridge of the nose, right upper lip and right hand.  Bleeding controlled.  Pt also seen by Dr. Lacinda Axon and care plan discussed.  CT head negative for acute intracranial injury, and negative for acute cervical spine fracture or listhesis.  No concerning symptoms for ACS.   [facility], Dr. Redmond Baseman, and discussed CT maxillofacial findings.  He recommends irrigation and closure of the wound to the nose.  He will see patient in office in 5 days for follow-up.  Wounds irrigated and approximated.  TD updated.  Since patient does have some likely dental  chip fractures, I will start on amoxicillin.  I discussed treatment plan with patient and his daughter who is at bedside.  Both verbalized understanding and agreed to plan.  Final Clinical Impressions(s) / ED Diagnoses   Final diagnoses:  Open fracture of nasal bone, initial encounter  Laceration of right hand without foreign body, initial encounter  Laceration of upper lip with complication, initial encounter    ED Discharge Orders    None       Bufford Lope 10/29/19 0218    Nat Christen, MD 10/31/19 2107

## 2019-10-28 NOTE — ED Triage Notes (Signed)
Pt does not know what made him fall.  Per staff at [facility], pt fell into a plant that was sitting on the floor and broke the flower pot.  Pt has injury to his upper lip, front teeth, nose and right hand with laceration present.  Pt denies loc.

## 2019-10-28 NOTE — ED Notes (Signed)
Lab aware of need for blood draw.

## 2019-10-29 LAB — TROPONIN I (HIGH SENSITIVITY): Troponin I (High Sensitivity): 11 ng/L (ref <18)

## 2019-10-29 MED ORDER — TETANUS-DIPHTH-ACELL PERTUSSIS 5-2.5-18.5 LF-MCG/0.5 IM SUSP
0.5000 mL | Freq: Once | INTRAMUSCULAR | Status: AC
  Administered 2019-10-29: 0.5 mL via INTRAMUSCULAR
  Filled 2019-10-29: qty 0.5

## 2019-10-29 MED ORDER — OXYMETAZOLINE HCL 0.05 % NA SOLN
2.0000 | Freq: Once | NASAL | Status: AC
  Administered 2019-10-29: 2 via NASAL
  Filled 2019-10-29: qty 30

## 2019-10-29 MED ORDER — LIDOCAINE HCL (PF) 1 % IJ SOLN
5.0000 mL | Freq: Once | INTRAMUSCULAR | Status: DC
  Filled 2019-10-29: qty 6

## 2019-10-29 MED ORDER — AMOXICILLIN 500 MG PO CAPS: 500.0000 mg | ORAL_CAPSULE | Freq: Three times a day (TID) | ORAL | 0 refills | Status: DC

## 2019-10-29 NOTE — ED Notes (Signed)
Tammy, PA at bedside for suture repair to face and right hand.

## 2019-10-29 NOTE — Discharge Instructions (Signed)
The CT scan of your face showed that you have a fracture of your nasal bones.  Try to avoid blowing your nose forcefully.  Clean the wounds of your face with mild soap and water and keep your nose bandaged.  The sutures in your lip and nose will dissolve.  The laceration of your hand also clean with mild soap and water.  Those stitches will need to be removed in 8 days.  Keep the area bandaged.  Return to the ER for any signs of infection.  You will also need to follow-up with your dentist.

## 2019-10-29 NOTE — ED Notes (Signed)
Pt jeans are wet- when asked what happened, pt says "he must have leaked". Staff at [facility] were made aware of this and they report staff will bring clean pair of pants when transportation comes to pick up pt.  Pt was offered to change into paper scrub pants, but pt refused.

## 2019-10-29 NOTE — ED Notes (Signed)
Pt assisted with changing brief and changing pants prior to leaving. DC papers/DNR/Most form handed to staff with transportation from [place].

## 2019-11-02 DIAGNOSIS — S022XXD Fracture of nasal bones, subsequent encounter for fracture with routine healing: Secondary | ICD-10-CM | POA: Diagnosis not present

## 2019-11-09 DIAGNOSIS — S61411A Laceration without foreign body of right hand, initial encounter: Secondary | ICD-10-CM | POA: Diagnosis not present

## 2019-11-18 DIAGNOSIS — Z03818 Encounter for observation for suspected exposure to other biological agents ruled out: Secondary | ICD-10-CM | POA: Diagnosis not present

## 2019-11-23 DIAGNOSIS — Z03818 Encounter for observation for suspected exposure to other biological agents ruled out: Secondary | ICD-10-CM | POA: Diagnosis not present

## 2019-11-25 DIAGNOSIS — Z20828 Contact with and (suspected) exposure to other viral communicable diseases: Secondary | ICD-10-CM | POA: Diagnosis not present

## 2019-11-30 DIAGNOSIS — Z03818 Encounter for observation for suspected exposure to other biological agents ruled out: Secondary | ICD-10-CM | POA: Diagnosis not present

## 2019-12-07 DIAGNOSIS — Z20828 Contact with and (suspected) exposure to other viral communicable diseases: Secondary | ICD-10-CM | POA: Diagnosis not present

## 2019-12-29 DIAGNOSIS — Z20828 Contact with and (suspected) exposure to other viral communicable diseases: Secondary | ICD-10-CM | POA: Diagnosis not present

## 2020-01-05 DIAGNOSIS — Z03818 Encounter for observation for suspected exposure to other biological agents ruled out: Secondary | ICD-10-CM | POA: Diagnosis not present

## 2020-01-12 DIAGNOSIS — Z03818 Encounter for observation for suspected exposure to other biological agents ruled out: Secondary | ICD-10-CM | POA: Diagnosis not present

## 2020-01-17 DIAGNOSIS — Z03818 Encounter for observation for suspected exposure to other biological agents ruled out: Secondary | ICD-10-CM | POA: Diagnosis not present

## 2020-01-19 DIAGNOSIS — Z03818 Encounter for observation for suspected exposure to other biological agents ruled out: Secondary | ICD-10-CM | POA: Diagnosis not present

## 2020-01-24 DIAGNOSIS — Z03818 Encounter for observation for suspected exposure to other biological agents ruled out: Secondary | ICD-10-CM | POA: Diagnosis not present

## 2020-01-26 DIAGNOSIS — Z03818 Encounter for observation for suspected exposure to other biological agents ruled out: Secondary | ICD-10-CM | POA: Diagnosis not present

## 2020-01-31 DIAGNOSIS — Z03818 Encounter for observation for suspected exposure to other biological agents ruled out: Secondary | ICD-10-CM | POA: Diagnosis not present

## 2020-02-02 DIAGNOSIS — Z03818 Encounter for observation for suspected exposure to other biological agents ruled out: Secondary | ICD-10-CM | POA: Diagnosis not present

## 2020-02-02 DIAGNOSIS — M79674 Pain in right toe(s): Secondary | ICD-10-CM | POA: Diagnosis not present

## 2020-02-02 DIAGNOSIS — M79675 Pain in left toe(s): Secondary | ICD-10-CM | POA: Diagnosis not present

## 2020-02-02 DIAGNOSIS — B351 Tinea unguium: Secondary | ICD-10-CM | POA: Diagnosis not present

## 2020-04-06 ENCOUNTER — Encounter: Payer: Self-pay | Admitting: Neurology

## 2020-04-06 ENCOUNTER — Ambulatory Visit: Payer: Medicare Other | Admitting: Neurology

## 2020-04-06 ENCOUNTER — Other Ambulatory Visit: Payer: Self-pay

## 2020-04-06 VITALS — BP 122/80 | HR 76 | Temp 97.6°F | Wt 231.3 lb

## 2020-04-06 DIAGNOSIS — F0151 Vascular dementia with behavioral disturbance: Secondary | ICD-10-CM | POA: Diagnosis not present

## 2020-04-06 DIAGNOSIS — F01518 Vascular dementia, unspecified severity, with other behavioral disturbance: Secondary | ICD-10-CM

## 2020-04-06 NOTE — Progress Notes (Signed)
Subjective:    Patient ID: KOSUKE CALL is a 84 y.o. male.  HPI     Star Age, MD, PhD [facility] [address] [place], [place] [address]  Dear Dr. Willey Blade,  I saw your patient, hyperlipoidemia, upon your kind request in my neurologic clinic today for initial consultation of his recent behavioral changes.  The patient is accompanied by his daughter today.  As you know, Mr. Alongi is an 84 year old right-handed gentleman with an underlying complex medical history of hypertension, heart disease with status post CABG, paroxysmal A. fib, vascular dementia, prior strokes, history of altered mental status and confusion as well as personality changes in January 2017 with extensive inpatient work-up, who reports No history of his own.  His history is provided by his daughter.  She reports that in the past few weeks he has had changes in his behavior with inappropriate sexual advances towards residents at his memory care facility.  He is in the process of moving to another facility and secured assisted living for now and the option of transitioning to a memory care unit.  He has not had any recent falls but fell last year and injured his nose.  He has had good appetite and no sudden onset of one-sided weakness or numbness.  He has not had any recent blood work or urine test, he had some medication changes recently including discontinuation of trazodone about a week ago and increase in his sertraline from 25 to 50 mg daily.  24-7 supervision was advised by his memory care facility. I reviewed your telemedicine note from 06/08/2019.  He was reported to be stable at the time.  He had seen Dr. Erlinda Hong in the past, last seen nearly 4 years ago.   Previously:   Per Dr. Erlinda Hong, 05/21/16:   <<Elo KHARON SCHRAER is a 84 y.o. male with PMH of HTN, CAD s/p CABG in 08/2015 with transient psot op afib who was admitted to [facility] on 12/21/15 for acute to subacute onset confusion with  personality changes. As per note, "about 2-1/2 weeks prior to the admission, he started becoming really sleepy often dozing off doing things. This has been associated with confusion, memory impairment and nonsensical speech. It has gotten worse. He has done inappropriate on things such as trying to use his cell phone to function as the TV remote. The girlfriend reports that he is very talkative but recently has been withdrawn and not talking to anyone. The patient himself is reserved in that has very limited spontaneous speech." CT concerning for subacute infarct in the left GP. MRI showed bilateral DMI restriction of GP, right stronger signal than left, as well as extensive periventricular WM ischemic changes. "MRA left M1 distal mild to moderate stenosis. CUS negative. Had LP showed elevated protein 83 and 430 RBCs. 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 14-3-3 was also sent. He was transferred to [facility] for further workup. HSV was confirmed to be negative and acyclovir was discontinued. Repeat MRI brain at [facility] showed diffusion restriction of the left medial basal ganglia consistent with acute-subacute stroke and remote right medial basal ganglia stroke and thalamic lacunar strokes. MRA neck and brain was negative. Heavy metal urine studies were sent which returned negative. Due to the highly asymmetric nature of his lesions, toxic metabolic etiology was felt to be highly unlikely. His MRI findings and clinical presentation were felt to be  secondary to acute left basal ganglia stroke in the setting of remote right basal ganglia stroke and thus decompensation of this system due to bilateral lesions. LDL of 63, A1C 6.2. Statins were recommended however the family refused given prior history of severe myalgias due to statin. TTE showed EF of 55-60% with no shunt and mildly dilated LA. The etiology of his stroke was felt to be secondary to  microvascular disease and due to the presence of existing cardiac atherosclerotic disease we felt that DAPT was indicated. His home aspirin 325mg  was decreased to 81mg  and plavix 75mg  daily added. He was monitored on telemetry during his admission and no evidence of atrial fibrillation was seen". Due to persistent drowsiness, the patient was started on amantadine to improve alertness and positive changes were noted prior to discharge. He was discharged to ALF. Currently, he is continued on ASA and plavix for stroke prevention and amantadine for mental status. As per daughter and son, he is more awake alert but still has social withdrawal and personality changes, more docile and limited speech output.    Interval history: During the interval time, pt has been doing the same. He was not able to live in ALF without supervision anymore due to confusion and wondering around outside apartment. He was then sent to memory unit. Son stated that as most memory unit residents have advanced dementia, pt seems lack of communication there and concerning for further decline. However, his insurance not covering for SNF. BP today 113/66. On ASA and plavix and family still declined statin or aricept or nemanda >>    His Past Medical History Is Significant For: Past Medical History:  Diagnosis Date  . Alzheimer's dementia ([place])   . Atrial fibrillation, transient ([place])    a. Post-op from CABG 08/2015.  Marland Kitchen CAD (coronary artery disease)    a. DES to the RCA March 2013 with residual dz rx medically. b. s/p CABGx4 in 08/2015.  . Collagen vascular disease ([place])   . COPD (chronic obstructive pulmonary disease) ([place])   . Dysphagia   . Essential hypertension   . GERD (gastroesophageal reflux disease)   . History of stroke    January 2017  . HLD (hyperlipidemia)    a. H/o multiple statin intolerances.  Marland Kitchen Hx of adenomatous colonic polyps   . Hyperlipidemia   . IBS (irritable bowel syndrome)   . Nephrolithiasis   . NSTEMI  (non-ST elevated myocardial infarction) [facility]) March 2013  . Obesity   . Stroke [facility])     His Past Surgical History Is Significant For: Past Surgical History:  Procedure Laterality Date  . APPENDECTOMY    . BACK SURGERY    . CARDIAC CATHETERIZATION N/A 08/09/2015   Procedure: Left Heart Cath and Coronary Angiography;  Surgeon: Jettie Booze, MD;  Location: [facility];  Service: Cardiovascular;  Laterality: N/A;  . COLONOSCOPY  08/21/2004   Pedunculated polyp at the splenic flexure  . COLONOSCOPY  09/2009   Dr. Rourk--> Cecal and hepatic flexure polyps, tubular adenomas. Next colonoscopy October 2013  . COLONOSCOPY N/A 05/07/2013   RMR: colonic polyp/colonic diverticulosis  . CORONARY ARTERY BYPASS GRAFT N/A 08/11/2015   Procedure: CORONARY ARTERY BYPASS GRAFTING (CABG) X4 UTILIZING THE LEFT INTERNAL MAMMARY ARTERY TO LAD AND ENDOSCOPICALLY HARVESTED BILATERAL SAPHENEOUS VEINS TO DIAGONAL,OM1 AND OM2;  Surgeon: Ivin Poot, MD;  Location: [facility];  Service: Open Heart Surgery;  Laterality: N/A;  . ESOPHAGOGASTRODUODENOSCOPY  2003   distal erosion/small hiatal hernia  .  ESOPHAGOGASTRODUODENOSCOPY  09/2009   Dr. Evalee Mutton ring, mild distal esophageal erosions, status post Maloney dilation, small hiatal hernia.  Marland Kitchen ESOPHAGOGASTRODUODENOSCOPY N/A 03/16/2015   [id number] HH/focal area of distal esophageal s/p bx  . LEFT HEART CATHETERIZATION WITH CORONARY ANGIOGRAM N/A 03/02/2012   Procedure: LEFT HEART CATHETERIZATION WITH CORONARY ANGIOGRAM;  Surgeon: Peter M Martinique, MD;  Location: [facility] CATH LAB;  Service: Cardiovascular;  Laterality: N/A;  . PERCUTANEOUS CORONARY STENT INTERVENTION (PCI-S) N/A 03/02/2012   Procedure: PERCUTANEOUS CORONARY STENT INTERVENTION (PCI-S);  Surgeon: Peter M Martinique, MD;  Location: [facility] CATH LAB;  Service: Cardiovascular;  Laterality: N/A;  . TEE WITHOUT CARDIOVERSION N/A 08/11/2015   Procedure: TRANSESOPHAGEAL ECHOCARDIOGRAM (TEE);  Surgeon: Ivin Poot, MD;  Location: [facility];  Service: Open Heart Surgery;  Laterality: N/A;    His Family History Is Significant For: Family History  Problem Relation Age of Onset  . Pneumonia Father   . Heart disease Mother   . Diabetes Mother   . Stroke Mother   . Stroke Sister   . Breast cancer Sister        2 sisters  . Esophageal cancer Brother   . Colon cancer Neg Hx     His Social History Is Significant For: Social History   Socioeconomic History  . Marital status: Divorced    Spouse name: Not on file  . Number of children: Not on file  . Years of education: Not on file  . Highest education level: Not on file  Occupational History  . Occupation: retired Hydrologist  Tobacco Use  . Smoking status: Former Smoker    Packs/day: 1.50    Years: 45.00    Pack years: 67.50    Types: Cigarettes    Quit date: 12/09/2001    Years since quitting: 18.3  . Smokeless tobacco: Never Used  . Tobacco comment: quit about 9 years ago  Substance and Sexual Activity  . Alcohol use: No    Alcohol/week: 0.0 standard drinks    Comment: Very rarely  . Drug use: No  . Sexual activity: Not Currently  Other Topics Concern  . Not on file  Social History Narrative  . Not on file   Social Determinants of Health   Financial Resource Strain:   . Difficulty of Paying Living Expenses:   Food Insecurity:   . Worried About Charity fundraiser in the Last Year:   . Arboriculturist in the Last Year:   Transportation Needs:   . Film/video editor (Medical):   Marland Kitchen Lack of Transportation (Non-Medical):   Physical Activity:   . Days of Exercise per Week:   . Minutes of Exercise per Session:   Stress:   . Feeling of Stress :   Social Connections:   . Frequency of Communication with Friends and Family:   . Frequency of Social Gatherings with Friends and Family:   . Attends Religious Services:   . Active Member of Clubs or Organizations:   . Attends Archivist Meetings:   Marland Kitchen Marital  Status:     His Allergies Are:  Allergies  Allergen Reactions  . Codeine Other (See Comments)    Pt does not know   . Statins Other (See Comments) and Nausea And Vomiting  :   His Current Medications Are:  Outpatient Encounter Medications as of 04/06/2020  Medication Sig  . acetaminophen (TYLENOL) 325 MG tablet Take 2 tablets (650 mg total) by mouth every 6 (six) hours as  needed for mild pain, fever or headache. (Patient taking differently: Take 325 mg by mouth every 6 (six) hours as needed for mild pain or fever. )  . albuterol (PROVENTIL HFA;VENTOLIN HFA) 108 (90 Base) MCG/ACT inhaler Inhale 2 puffs into the lungs every 6 (six) hours as needed for wheezing or shortness of breath. (Patient taking differently: Inhale 2 puffs into the lungs 3 (three) times daily. )  . loperamide (IMODIUM) 2 MG capsule Take 2 mg by mouth every morning.   . sertraline (ZOLOFT) 25 MG tablet Take 25 mg by mouth every morning.   . tamsulosin (FLOMAX) 0.4 MG CAPS capsule Take 0.4 mg by mouth daily.  . [DISCONTINUED] amoxicillin (AMOXIL) 500 MG capsule Take 1 capsule (500 mg total) by mouth 3 (three) times daily.  . [DISCONTINUED] traZODone (DESYREL) 50 MG tablet Take 50 mg by mouth at bedtime.    No facility-administered encounter medications on file as of 04/06/2020.  :   Review of Systems:  Out of a complete 14 point review of systems, all are reviewed and negative with the exception of these symptoms as listed below:  Review of Systems  Neurological:       Here for evaluation on worsening memory/ behavioral changes. Daughter reports over the week pt has been exhibiting aggressive behaviors towards residents.      Objective:  Neurological Exam  Physical Exam Physical Examination:   Vitals:   04/06/20 1202  BP: 122/80  Pulse: 76  Temp: 97.6 F (36.4 C)    General Examination: The patient is a very pleasant 84 y.o. male in no acute distress. He appears well-developed and well-nourished and  well groomed.   HEENT: Normocephalic, atraumatic, pupils are equal, round and reactive to light, extraocular tracking is mildly impaired, he has trouble keeping his head still while tracking.  He has no significant nuchal rigidity.  Airway examination reveals mild mouth dryness, tongue protrudes centrally and palate elevates symmetrically, no facial masking, no lip, neck or jaw tremor.  Hearing is severely impaired and he forgot to bring his eyeglasses.   Chest: Clear to auscultation without wheezing, rhonchi or crackles noted.  Heart: S1+S2+0, regular and normal without murmurs, rubs or gallops noted.   Abdomen: Soft, non-tender and non-distended with normal bowel sounds appreciated on auscultation.  Extremities: There is no pitting edema in the distal lower extremities bilaterally.   Skin: Warm and dry without trophic changes noted.  Musculoskeletal: exam reveals no obvious joint deformities, tenderness or joint swelling or erythema.   Neurologically:  Mental status: The patient is awake, pays good attention but is unable to give his history.  His MMSE shows a score of 12 out of 30, clock drawing is 2 out of 4, animal fluency 5/min.    He is minimally verbal, no obvious dysarthria or voice tremor or hypophonia noted.  He is able to follow simple commands but not able to perform any complex commands and is very hard of hearing which makes communication very difficult.   Mood and affect are difficult to assess. Cranial nerves II - XII are as described above under HEENT exam. In addition: shoulder shrug is normal with equal shoulder height noted. Motor exam: Normal bulk, strength and tone is noted. There is no resting tremor, reflexes are 1+ in the upper extremities and absent in the lower extremities, Romberg is not tested for safety reasons, fine motor skills shows no decrement in amplitude with finger taps and foot taps, sensory exam intact to light touch.  He stands with mild difficulty but  requires no assistance, posture is age-appropriate and walks slowly and cautiously, slight difficulty with turns with slight insecurity noted, no shuffling, has preserved arm swing.   Assessment and plan:   In summary, ORA CRARY is a very pleasant 84 y.o.-year old male with an underlying complex medical history of hypertension, heart disease with status post CABG, paroxysmal A. fib, vascular dementia, prior strokes, history of altered mental status and confusion as well as personality changes in January 2017 with extensive inpatient work-up, who presents for evaluation of recent behavioral changes.  He has had interim medication changes, trazodone was discontinued and sertraline was increased from 25 mg daily to 50 mg daily.  I suggested evaluation with a geriatric psychiatrist for behavioral concerns.  I did not suggest any new medication changes but would like to make sure there is no obvious cause for these changes.  I am not sure if he had any recent work-up with blood work and urinalysis to look for urinary tract infection and electrolyte disturbances or changes in his kidney function.  We will go ahead and do some blood work today and urinalysis and call his daughter with the results.  She declines a referral to geriatric psychiatry today.  She is advised that what with the recent medication changes, these may take some time to take effect.  He is advised to follow-up routinely to see one of our nurse practitioners in 3 months, sooner if needed and we will keep his daughter posted as to his blood test results and urinalysis results by phone call.  I answered all their questions today and the patient and his daughter were in agreement.   Thank you very much for allowing me to participate in the care of this nice patient. If I can be of any further assistance to you please do not hesitate to call me at [phone].  Sincerely,   Star Age, MD, PhD

## 2020-04-06 NOTE — Patient Instructions (Signed)
We will check blood work today and urine test and call you with the test results. For behavioral changes, I recommend you wait out how he does on the recent medication changes with stopping the trazodone and increasing the sertraline.  For ongoing behavioral concerns, I would recommend consultation with a geriatric psychiatrist.  Please let me know if you would like a referral. Follow-up to see the nurse practitioner in 3 months.

## 2020-04-07 LAB — COMPREHENSIVE METABOLIC PANEL
ALT: 17 IU/L (ref 0–44)
AST: 25 IU/L (ref 0–40)
Albumin/Globulin Ratio: 1.4 (ref 1.2–2.2)
Albumin: 4.2 g/dL (ref 3.6–4.6)
Alkaline Phosphatase: 92 IU/L (ref 39–117)
BUN/Creatinine Ratio: 17 (ref 10–24)
BUN: 20 mg/dL (ref 8–27)
Bilirubin Total: 1.1 mg/dL (ref 0.0–1.2)
CO2: 23 mmol/L (ref 20–29)
Calcium: 9.2 mg/dL (ref 8.6–10.2)
Chloride: 103 mmol/L (ref 96–106)
Creatinine, Ser: 1.19 mg/dL (ref 0.76–1.27)
GFR calc Af Amer: 66 mL/min/{1.73_m2} (ref >59)
GFR calc non Af Amer: 57 mL/min/{1.73_m2} — ABNORMAL LOW (ref >59)
Globulin, Total: 2.9 g/dL (ref 1.5–4.5)
Glucose: 102 mg/dL — ABNORMAL HIGH (ref 65–99)
Potassium: 4.9 mmol/L (ref 3.5–5.2)
Sodium: 138 mmol/L (ref 134–144)
Total Protein: 7.1 g/dL (ref 6.0–8.5)

## 2020-04-07 LAB — URINALYSIS
Bilirubin, UA: NEGATIVE
Glucose, UA: NEGATIVE
Leukocytes,UA: NEGATIVE
Nitrite, UA: NEGATIVE
RBC, UA: NEGATIVE
Specific Gravity, UA: 1.024 (ref 1.005–1.030)
Urobilinogen, Ur: 0.2 mg/dL (ref 0.2–1.0)
pH, UA: 5.5 (ref 5.0–7.5)

## 2020-04-07 LAB — CBC WITH DIFFERENTIAL/PLATELET
Basophils Absolute: 0.1 10*3/uL (ref 0.0–0.2)
Basos: 1 %
EOS (ABSOLUTE): 0.4 10*3/uL (ref 0.0–0.4)
Eos: 3 %
Hematocrit: 46.3 % (ref 37.5–51.0)
Hemoglobin: 15.5 g/dL (ref 13.0–17.7)
Immature Grans (Abs): 0.1 10*3/uL (ref 0.0–0.1)
Immature Granulocytes: 1 %
Lymphocytes Absolute: 2.1 10*3/uL (ref 0.7–3.1)
Lymphs: 20 %
MCH: 28.8 pg (ref 26.6–33.0)
MCHC: 33.5 g/dL (ref 31.5–35.7)
MCV: 86 fL (ref 79–97)
Monocytes Absolute: 1.1 10*3/uL — ABNORMAL HIGH (ref 0.1–0.9)
Monocytes: 11 %
Neutrophils Absolute: 6.5 10*3/uL (ref 1.4–7.0)
Neutrophils: 64 %
Platelets: 178 10*3/uL (ref 150–450)
RBC: 5.38 x10E6/uL (ref 4.14–5.80)
RDW: 12.5 % (ref 11.6–15.4)
WBC: 10.2 10*3/uL (ref 3.4–10.8)

## 2020-04-07 LAB — B12 AND FOLATE PANEL
Folate: 6.9 ng/mL (ref >3.0)
Vitamin B-12: 472 pg/mL (ref 232–1245)

## 2020-04-07 LAB — RPR: RPR Ser Ql: NONREACTIVE

## 2020-04-07 LAB — TSH: TSH: 2.07 u[IU]/mL (ref 0.450–4.500)

## 2020-04-07 NOTE — Progress Notes (Signed)
Please call pt's daughter and advise her that there were slight abnormalities in the urine test, such as trace amount of protein, but no sign of UTI; I would recommend FU with primary care to discuss follow up testing of his urine. Otherwise, no obvious abnormality in e-lytes. Kidney function mildly abnormal, slightly worse from 5 mo ago. Thyroid screen and vit B12 okay.

## 2020-04-10 ENCOUNTER — Telehealth: Payer: Self-pay

## 2020-04-10 NOTE — Telephone Encounter (Signed)
I reached out to the pt's daughter (roxanne ok per dpr) and advised of results.  She verbalized understanding and had no further questions at this time. Results have been sent to Dr. Ria Comment office.

## 2020-04-10 NOTE — Telephone Encounter (Signed)
-----   Message from Star Age, MD sent at 04/07/2020 10:37 AM EDT ----- Please call pt's daughter and advise her that there were slight abnormalities in the urine test, such as trace amount of protein, but no sign of UTI; I would recommend FU with primary care to discuss follow up testing of his urine. Otherwise, no obvious abnormality in e-lytes. Kidney function mildly abnormal, slightly worse from 5 mo ago. Thyroid screen and vit B12 okay.

## 2020-04-27 DIAGNOSIS — F339 Major depressive disorder, recurrent, unspecified: Secondary | ICD-10-CM | POA: Diagnosis not present

## 2020-04-27 DIAGNOSIS — F0630 Mood disorder due to known physiological condition, unspecified: Secondary | ICD-10-CM | POA: Diagnosis not present

## 2020-04-27 DIAGNOSIS — F0391 Unspecified dementia with behavioral disturbance: Secondary | ICD-10-CM | POA: Diagnosis not present

## 2020-05-25 DIAGNOSIS — F0630 Mood disorder due to known physiological condition, unspecified: Secondary | ICD-10-CM | POA: Diagnosis not present

## 2020-05-25 DIAGNOSIS — F0391 Unspecified dementia with behavioral disturbance: Secondary | ICD-10-CM | POA: Diagnosis not present

## 2020-05-25 DIAGNOSIS — F339 Major depressive disorder, recurrent, unspecified: Secondary | ICD-10-CM | POA: Diagnosis not present

## 2020-06-28 DIAGNOSIS — F339 Major depressive disorder, recurrent, unspecified: Secondary | ICD-10-CM | POA: Diagnosis not present

## 2020-06-29 DIAGNOSIS — F062 Psychotic disorder with delusions due to known physiological condition: Secondary | ICD-10-CM | POA: Diagnosis not present

## 2020-06-29 DIAGNOSIS — F339 Major depressive disorder, recurrent, unspecified: Secondary | ICD-10-CM | POA: Diagnosis not present

## 2020-06-29 DIAGNOSIS — F0630 Mood disorder due to known physiological condition, unspecified: Secondary | ICD-10-CM | POA: Diagnosis not present

## 2020-06-29 DIAGNOSIS — F0391 Unspecified dementia with behavioral disturbance: Secondary | ICD-10-CM | POA: Diagnosis not present

## 2020-07-13 ENCOUNTER — Encounter: Payer: Self-pay | Admitting: Family Medicine

## 2020-07-13 ENCOUNTER — Ambulatory Visit: Payer: Medicare Other | Admitting: Family Medicine

## 2020-07-13 ENCOUNTER — Other Ambulatory Visit: Payer: Self-pay

## 2020-07-13 VITALS — BP 126/70 | HR 77 | Wt 232.0 lb

## 2020-07-13 DIAGNOSIS — F01518 Vascular dementia, unspecified severity, with other behavioral disturbance: Secondary | ICD-10-CM

## 2020-07-13 DIAGNOSIS — F0151 Vascular dementia with behavioral disturbance: Secondary | ICD-10-CM | POA: Diagnosis not present

## 2020-07-13 NOTE — Patient Instructions (Addendum)
Continue current treatment plan. I am happy that divalproex and Risperdal seem to be working well.   Please continue close follow up with PCP and psychiatry as advised.   Follow up with [place] as needed   Dementia Caregiver Guide Dementia is a term used to describe a number of symptoms that affect memory and thinking. The most common symptoms include:  Memory loss.  Trouble with language and communication.  Trouble concentrating.  Poor judgment.  Problems with reasoning.  Child-like behavior and language.  Extreme anxiety.  Angry outbursts.  Wandering from home or public places. Dementia usually gets worse slowly over time. In the early stages, people with dementia can stay independent and safe with some help. In later stages, they need help with daily tasks such as dressing, grooming, and using the bathroom. How to help the person with dementia cope Dementia can be frightening and confusing. Here are some tips to help the person with dementia cope with changes caused by the disease. General tips  Keep the person on track with his or her routine.  Try to identify areas where the person may need help.  Be supportive, patient, calm, and encouraging.  Gently remind the person that adjusting to changes takes time.  Help with the tasks that the person has asked for help with.  Keep the person involved in daily tasks and decisions as much as possible.  Encourage conversation, but try not to get frustrated or harried if the person struggles to find words or does not seem to appreciate your help. Communication tips  When the person is talking or seems frustrated, make eye contact and hold the person's hand.  Ask specific questions that need yes or no answers.  Use simple words, short sentences, and a calm voice. Only give one direction at a time.  When offering choices, limit them to just 1 or 2.  Avoid correcting the person in a negative way.  If the person is  struggling to find the right words, gently try to help him or her. How to recognize symptoms of stress Symptoms of stress in caregivers include:  Feeling frustrated or angry with the person with dementia.  Denying that the person has dementia or that his or her symptoms will not improve.  Feeling hopeless and unappreciated.  Difficulty sleeping.  Difficulty concentrating.  Feeling anxious, irritable, or depressed.  Developing stress-related health problems.  Feeling like you have too little time for your own life. Follow these instructions at home:   Make sure that you and the person you are caring for: ? Get regular sleep. ? Exercise regularly. ? Eat regular, nutritious meals. ? Drink enough fluid to keep your urine clear or pale yellow. ? Take over-the-counter and prescription medicines only as told by your health care providers. ? Attend all scheduled health care appointments.  Join a support group with others who are caregivers.  Ask about respite care resources so that you can have a regular break from the stress of caregiving.  Look for signs of stress in yourself and in the person you are caring for. If you notice signs of stress, take steps to manage it.  Consider any safety risks and take steps to avoid them.  Organize medications in a pill box for each day of the week.  Create a plan to handle any legal or financial matters. Get legal or financial advice if needed.  Keep a calendar in a central location to remind the person of appointments or other activities.  Tips for reducing the risk of injury  Keep floors clear of clutter. Remove rugs, magazine racks, and floor lamps.  Keep hallways well lit, especially at night.  Put a handrail and nonslip mat in the bathtub or shower.  Put childproof locks on cabinets that contain dangerous items, such as medicines, alcohol, guns, toxic cleaning items, sharp tools or utensils, matches, and lighters.  Put the locks  in places where the person cannot see or reach them easily. This will help ensure that the person does not wander out of the house and get lost.  Be prepared for emergencies. Keep a list of emergency phone numbers and addresses in a convenient area.  Remove car keys and lock garage doors so that the person does not try to get in the car and drive.  Have the person wear a bracelet that tracks locations and identifies the person as having memory problems. This should be worn at all times for safety. Where to find support: Many individuals and organizations offer support. These include:  Support groups for people with dementia and for caregivers.  Counselors or therapists.  Home health care services.  Adult day care centers. Where to find more information Alzheimer's Association: [employer] Contact a health care provider if:  The person's health is rapidly getting worse.  You are no longer able to care for the person.  Caring for the person is affecting your physical and emotional health.  The person threatens himself or herself, you, or anyone else. Summary  Dementia is a term used to describe a number of symptoms that affect memory and thinking.  Dementia usually gets worse slowly over time.  Take steps to reduce the person's risk of injury, and to plan for future care.  Caregivers need support, relief from caregiving, and time for their own lives. This information is not intended to replace advice given to you by your health care provider. Make sure you discuss any questions you have with your health care provider. Document Revised: 11/07/2017 Document Reviewed: 10/29/2016 Elsevier Patient Education  2020 [employer].

## 2020-07-13 NOTE — Progress Notes (Addendum)
PATIENT: Tyler Mejia DOB: Jan 06, 1936  REASON FOR VISIT: follow up HISTORY FROM: patient  Chief Complaint  Patient presents with  . Follow-up    rm 1 here for a f/u on dementia.     HISTORY OF PRESENT ILLNESS: Today 07/13/20 Tyler Mejia is a 84 y.o. male here today for follow up for vascular dementia with behavioral changes. He was last seen in 03/2020 when he was having increased difficulty with behavioral concerns. Recent medication changes had been made by PCP including addition of sertraline 50mg  and discontinuation of trazodone. On May 1st he moved to the [facility] in [place] living. He is doing ok. He is on a locked unit. Over the past 3 months, he has been doing better. His daughter reports he was seen by psychiatry. He was started on divalproex and Risperdal. She reports that since addition of medications, calls from the assisted living facility have stopped. She feels he is doing better. He seems to be tolerating medications well. He continues follow up with Dr Willey Blade, PCP.    HISTORY: (copied from Dr Guadelupe Sabin note on 04/06/2020)  Dear Dr. Willey Blade,  I saw your patient, hyperlipoidemia, upon your kind request in my neurologic clinic today for initial consultation of his recent behavioral changes.  The patient is accompanied by his daughter today.  As you know, Tyler Mejia is an 84 year old right-handed gentleman with an underlying complex medical history of hypertension, heart disease with status post CABG, paroxysmal A. fib, vascular dementia, prior strokes, history of altered mental status and confusion as well as personality changes in January 2017 with extensive inpatient work-up, who reports No history of his own.  His history is provided by his daughter.  She reports that in the past few weeks he has had changes in his behavior with inappropriate sexual advances towards residents at his memory care facility.  He is in the process of moving to another facility and secured  assisted living for now and the option of transitioning to a memory care unit.  He has not had any recent falls but fell last year and injured his nose.  He has had good appetite and no sudden onset of one-sided weakness or numbness.  He has not had any recent blood work or urine test, he had some medication changes recently including discontinuation of trazodone about a week ago and increase in his sertraline from 25 to 50 mg daily.  24-7 supervision was advised by his memory care facility. I reviewed your telemedicine note from 06/08/2019.  He was reported to be stable at the time.  He had seen Dr. Erlinda Hong in the past, last seen nearly 4 years ago.   Previously:   Per Dr. Erlinda Hong, 05/21/16:   <<Tyler Mejia a 84 y.o.malewith PMH of HTN, CAD s/p CABG in 08/2015 with transient psot op afib who was admitted to [facility] on 12/21/15 for acute to subacute onset confusion with personality changes. As per note, "about 2-1/2 weeks prior to the admission, he started becoming really sleepy often dozing off doing things. This has been associated with confusion, memory impairment and nonsensical speech. It has gotten worse. He has done inappropriate on things such as trying to use his cell phone to function as the TV remote. The girlfriend reports that he is very talkative but recently has been withdrawn and not talking to anyone. The patient himself is reserved in that has very limited spontaneous speech." CT concerning for subacute infarct in the left GP.  MRI showed bilateral DMI restriction of GP, right stronger signal than left, as well as extensive periventricular WM ischemic changes. "MRA left M1 distal mild to moderate stenosis. CUS negative. Had LP showed elevated protein 83 and 430 RBCs. 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 14-3-3 was also sent. He was transferred to [facility] for further workup. HSV was confirmed to be negative and acyclovir  was discontinued. Repeat MRI brain at [facility] showed diffusion restriction of the left medial basal ganglia consistent with acute-subacute stroke and remote right medial basal ganglia stroke and thalamic lacunar strokes. MRA neck and brain was negative. Heavy metal urine studies were sent which returned negative. Due to the highly asymmetric nature of his lesions, toxic metabolic etiology was felt to be highly unlikely. His MRI findings and clinical presentation were felt to be secondary to acute left basal ganglia stroke in the setting of remote right basal ganglia stroke and thus decompensation of this system due to bilateral lesions.LDL of 63, A1C 6.2. Statins were recommended however the family refused given prior history of severe myalgias due to statin. TTE showed EF of 55-60% with no shunt and mildly dilated LA. The etiology of his stroke was felt to be secondary to microvascular disease and due to the presence of existing cardiac atherosclerotic disease we felt that DAPT was indicated. His home aspirin 325mg  was decreased to 81mg  and plavix 75mg  daily added. He was monitored on telemetry during his admission and no evidence of atrial fibrillation was seen". Due to persistent drowsiness, the patient was started on amantadine to improve alertness and positive changes were noted prior to discharge. He was discharged to ALF. Currently, he is continued on ASA and plavix for stroke prevention and amantadine for mental status. As per daughter and son, he is more awake alert but still has social withdrawal and personality changes, more docile and limited speech output.   Interval history: During the interval time, pt has been doing the same. He was not able to live in ALF without supervision anymore due to confusion and wondering around outside apartment. He was then sent to memory unit. Son stated that as most memory unit residents have advanced dementia, pt seems lack of communication there and concerning for  further decline. However, his insurance not covering for SNF. BP today 113/66. On ASA and plavix and family still declined statin or aricept or nemanda >>   REVIEW OF SYSTEMS: Out of a complete 14 system review of symptoms, the patient complains only of the following symptoms, memory loss and all other reviewed systems are negative.  ALLERGIES: Allergies  Allergen Reactions  . Codeine Other (See Comments)    Pt does not know   . Statins Other (See Comments) and Nausea And Vomiting    HOME MEDICATIONS: Outpatient Medications Prior to Visit  Medication Sig Dispense Refill  . acetaminophen (TYLENOL) 325 MG tablet Take 2 tablets (650 mg total) by mouth every 6 (six) hours as needed for mild pain, fever or headache. (Patient taking differently: Take 325 mg by mouth every 6 (six) hours as needed for mild pain or fever. )    . albuterol (PROVENTIL HFA;VENTOLIN HFA) 108 (90 Base) MCG/ACT inhaler Inhale 2 puffs into the lungs every 6 (six) hours as needed for wheezing or shortness of breath. (Patient taking differently: Inhale 2 puffs into the lungs 3 (three) times daily. ) 1 Inhaler 1  . alum & mag hydroxide-simeth (MI-ACID)  200-200-20 MG/5ML suspension Take by mouth every 6 (six) hours as needed for indigestion or heartburn.    . divalproex (DEPAKOTE) 125 MG DR tablet Take 125 mg by mouth 3 (three) times daily.    Marland Kitchen guaiFENesin (ROBITUSSIN) 100 MG/5ML SOLN Take 5 mLs by mouth every 4 (four) hours as needed for cough or to loosen phlegm.    . loperamide (IMODIUM) 2 MG capsule Take 2 mg by mouth every morning.     . risperiDONE (RISPERDAL) 0.5 MG tablet Take 0.5 mg by mouth at bedtime.    . sertraline (ZOLOFT) 25 MG tablet Take 25 mg by mouth every morning.     . tamsulosin (FLOMAX) 0.4 MG CAPS capsule Take 0.4 mg by mouth daily.  10   No facility-administered medications prior to visit.    PAST MEDICAL HISTORY: Past Medical History:  Diagnosis Date  . Alzheimer's dementia ([place])   . Atrial  fibrillation, transient ([place])    a. Post-op from CABG 08/2015.  Marland Kitchen CAD (coronary artery disease)    a. DES to the RCA March 2013 with residual dz rx medically. b. s/p CABGx4 in 08/2015.  . Collagen vascular disease ([place])   . COPD (chronic obstructive pulmonary disease) ([place])   . Dysphagia   . Essential hypertension   . GERD (gastroesophageal reflux disease)   . History of stroke    January 2017  . HLD (hyperlipidemia)    a. H/o multiple statin intolerances.  Marland Kitchen Hx of adenomatous colonic polyps   . Hyperlipidemia   . IBS (irritable bowel syndrome)   . Nephrolithiasis   . NSTEMI (non-ST elevated myocardial infarction) [facility]) March 2013  . Obesity   . Stroke [facility])     PAST SURGICAL HISTORY: Past Surgical History:  Procedure Laterality Date  . APPENDECTOMY    . BACK SURGERY    . CARDIAC CATHETERIZATION N/A 08/09/2015   Procedure: Left Heart Cath and Coronary Angiography;  Surgeon: Jettie Booze, MD;  Location: [place] CV LAB;  Service: Cardiovascular;  Laterality: N/A;  . COLONOSCOPY  08/21/2004   Pedunculated polyp at the splenic flexure  . COLONOSCOPY  09/2009   Dr. Rourk--> Cecal and hepatic flexure polyps, tubular adenomas. Next colonoscopy October 2013  . COLONOSCOPY N/A 05/07/2013   RMR: colonic polyp/colonic diverticulosis  . CORONARY ARTERY BYPASS GRAFT N/A 08/11/2015   Procedure: CORONARY ARTERY BYPASS GRAFTING (CABG) X4 UTILIZING THE LEFT INTERNAL MAMMARY ARTERY TO LAD AND ENDOSCOPICALLY HARVESTED BILATERAL SAPHENEOUS VEINS TO DIAGONAL,OM1 AND OM2;  Surgeon: Ivin Poot, MD;  Location: [place];  Service: Open Heart Surgery;  Laterality: N/A;  . ESOPHAGOGASTRODUODENOSCOPY  2003   distal erosion/small hiatal hernia  . ESOPHAGOGASTRODUODENOSCOPY  09/2009   Dr. Evalee Mutton ring, mild distal esophageal erosions, status post Maloney dilation, small hiatal hernia.  Marland Kitchen ESOPHAGOGASTRODUODENOSCOPY N/A 03/16/2015   ALP:FXTKW HH/focal area of distal esophageal s/p bx  . LEFT  HEART CATHETERIZATION WITH CORONARY ANGIOGRAM N/A 03/02/2012   Procedure: LEFT HEART CATHETERIZATION WITH CORONARY ANGIOGRAM;  Surgeon: Peter M Martinique, MD;  Location: [facility] CATH LAB;  Service: Cardiovascular;  Laterality: N/A;  . PERCUTANEOUS CORONARY STENT INTERVENTION (PCI-S) N/A 03/02/2012   Procedure: PERCUTANEOUS CORONARY STENT INTERVENTION (PCI-S);  Surgeon: Peter M Martinique, MD;  Location: [facility] CATH LAB;  Service: Cardiovascular;  Laterality: N/A;  . TEE WITHOUT CARDIOVERSION N/A 08/11/2015   Procedure: TRANSESOPHAGEAL ECHOCARDIOGRAM (TEE);  Surgeon: Ivin Poot, MD;  Location: [place];  Service: Open Heart Surgery;  Laterality: N/A;    FAMILY HISTORY:  Family History  Problem Relation Age of Onset  . Pneumonia Father   . Heart disease Mother   . Diabetes Mother   . Stroke Mother   . Stroke Sister   . Breast cancer Sister        2 sisters  . Esophageal cancer Brother   . Colon cancer Neg Hx     SOCIAL HISTORY: Social History   Socioeconomic History  . Marital status: Divorced    Spouse name: Not on file  . Number of children: Not on file  . Years of education: Not on file  . Highest education level: Not on file  Occupational History  . Occupation: retired Hydrologist  Tobacco Use  . Smoking status: Former Smoker    Packs/day: 1.50    Years: 45.00    Pack years: 67.50    Types: Cigarettes    Quit date: 12/09/2001    Years since quitting: 18.6  . Smokeless tobacco: Never Used  . Tobacco comment: quit about 9 years ago  Substance and Sexual Activity  . Alcohol use: No    Alcohol/week: 0.0 standard drinks    Comment: Very rarely  . Drug use: No  . Sexual activity: Not Currently  Other Topics Concern  . Not on file  Social History Narrative  . Not on file   Social Determinants of Health   Financial Resource Strain:   . Difficulty of Paying Living Expenses:   Food Insecurity:   . Worried About Charity fundraiser in the Last Year:   . Arboriculturist in the  Last Year:   Transportation Needs:   . Film/video editor (Medical):   Marland Kitchen Lack of Transportation (Non-Medical):   Physical Activity:   . Days of Exercise per Week:   . Minutes of Exercise per Session:   Stress:   . Feeling of Stress :   Social Connections:   . Frequency of Communication with Friends and Family:   . Frequency of Social Gatherings with Friends and Family:   . Attends Religious Services:   . Active Member of Clubs or Organizations:   . Attends Archivist Meetings:   Marland Kitchen Marital Status:   Intimate Partner Violence:   . Fear of Current or Ex-Partner:   . Emotionally Abused:   Marland Kitchen Physically Abused:   . Sexually Abused:       PHYSICAL EXAM  Vitals:   07/13/20 1351  BP: 126/70  Pulse: 77  Weight: 232 lb (105.2 kg)   Body mass index is 34.26 kg/m.  Generalized: Well developed, in no acute distress  Cardiology: normal rate and rhythm, no murmur noted Respiratory: clear to auscultation bilaterally  Neurological examination  Mentation: Alert, he is oriented to time, but not oriented to place and is unable to assist with history taking. Follows all commands speech and language fluent Cranial nerve II-XII: Pupils were equal round reactive to light. Extraocular movements were full, visual field were full on confrontational test. Facial sensation and strength were normal. Uvula tongue midline. Head turning and shoulder shrug  were normal and symmetric. Motor: The motor testing reveals 5 over 5 strength of all 4 extremities. Good symmetric motor tone is noted throughout.  Sensory: Sensory testing is intact to soft touch on all 4 extremities. No evidence of extinction is noted.  Coordination: Cerebellar testing reveals good finger-nose-finger and heel-to-shin bilaterally.  Gait and station: Gait is short but stable, no assistive device today. Tandem not attempted   DIAGNOSTIC  DATA (LABS, IMAGING, TESTING) - I reviewed patient records, labs, notes, testing and  imaging myself where available.  MMSE - Mini Mental State Exam 07/13/2020 04/06/2020  Orientation to time 3 1  Orientation to Place 1 3  Registration 3 2  Attention/ Calculation 0 0  Recall 0 0  Language- name 2 objects 2 2  Language- repeat 0 1  Language- follow 3 step command 1 2  Language- read & follow direction 1 1  Write a sentence 1 0  Copy design 0 0  Total score 12 12     Lab Results  Component Value Date   WBC 10.2 04/06/2020   HGB 15.5 04/06/2020   HCT 46.3 04/06/2020   MCV 86 04/06/2020   PLT 178 04/06/2020      Component Value Date/Time   NA 138 04/06/2020 1313   K 4.9 04/06/2020 1313   CL 103 04/06/2020 1313   CO2 23 04/06/2020 1313   GLUCOSE 102 (H) 04/06/2020 1313   GLUCOSE 139 (H) 10/28/2019 2151   BUN 20 04/06/2020 1313   CREATININE 1.19 04/06/2020 1313   CALCIUM 9.2 04/06/2020 1313   PROT 7.1 04/06/2020 1313   ALBUMIN 4.2 04/06/2020 1313   AST 25 04/06/2020 1313   ALT 17 04/06/2020 1313   ALKPHOS 92 04/06/2020 1313   BILITOT 1.1 04/06/2020 1313   GFRNONAA 57 (L) 04/06/2020 1313   GFRAA 66 04/06/2020 1313   Lab Results  Component Value Date   CHOL 128 12/22/2015   HDL 29 (L) 12/22/2015   LDLCALC 73 12/22/2015   TRIG 129 12/22/2015   CHOLHDL 4.4 12/22/2015   Lab Results  Component Value Date   HGBA1C 5.5 09/05/2017   Lab Results  Component Value Date   VITAMINB12 472 04/06/2020   Lab Results  Component Value Date   TSH 2.070 04/06/2020       ASSESSMENT AND PLAN 84 y.o. year old male  has a past medical history of Alzheimer's dementia ([place]), Atrial fibrillation, transient ([place]), CAD (coronary artery disease), Collagen vascular disease ([place]), COPD (chronic obstructive pulmonary disease) ([place]), Dysphagia, Essential hypertension, GERD (gastroesophageal reflux disease), History of stroke, HLD (hyperlipidemia), adenomatous colonic polyps, Hyperlipidemia, IBS (irritable bowel syndrome), Nephrolithiasis, NSTEMI (non-ST elevated myocardial  infarction) [facility]) (March 2013), Obesity, and Stroke [facility]). here with     ICD-10-CM   1. Vascular dementia with behavioral disturbance [facility])  F01.51     Mr Tuite is doing better. He is now followed by psychiatry who is helping with medications. Behavioral concerns seem to be well managed on divalproex and Risperdal. He is tolerating medications well. He seems happy and appropriate today. I have had a lengthy discussion with his daughter regarding the progressive nature of dementia, need for safety precautions, and goals of care. He will continue close follow up with PCP and psychiatry as advised. Healthy lifestyle habits encouraged. He will follow up with [place] as needed. His daughter verbalizes understanding with this plan.    No orders of the defined types were placed in this encounter.    No orders of the defined types were placed in this encounter.     I spent 40 minutes with the patient. 50% of this time was spent counseling and educating patient on plan of care and medications.    Debbora Presto, FNP-C 07/13/2020, 3:20 PM [facility] [address] [place], [place] [address] [phone]  I reviewed the above note and documentation by the Nurse Practitioner and agree with the  history, exam, assessment and plan as outlined above. I was available for consultation. Star Age, MD, PhD [facility])

## 2020-07-20 DIAGNOSIS — F339 Major depressive disorder, recurrent, unspecified: Secondary | ICD-10-CM | POA: Diagnosis not present

## 2020-07-20 DIAGNOSIS — F0391 Unspecified dementia with behavioral disturbance: Secondary | ICD-10-CM | POA: Diagnosis not present

## 2020-07-20 DIAGNOSIS — F0630 Mood disorder due to known physiological condition, unspecified: Secondary | ICD-10-CM | POA: Diagnosis not present

## 2020-07-20 DIAGNOSIS — F062 Psychotic disorder with delusions due to known physiological condition: Secondary | ICD-10-CM | POA: Diagnosis not present

## 2020-07-26 DIAGNOSIS — F339 Major depressive disorder, recurrent, unspecified: Secondary | ICD-10-CM | POA: Diagnosis not present

## 2020-08-29 DIAGNOSIS — M79675 Pain in left toe(s): Secondary | ICD-10-CM | POA: Diagnosis not present

## 2020-08-29 DIAGNOSIS — B351 Tinea unguium: Secondary | ICD-10-CM | POA: Diagnosis not present

## 2020-08-29 DIAGNOSIS — M79674 Pain in right toe(s): Secondary | ICD-10-CM | POA: Diagnosis not present

## 2020-09-20 DIAGNOSIS — F339 Major depressive disorder, recurrent, unspecified: Secondary | ICD-10-CM | POA: Diagnosis not present

## 2020-10-09 DIAGNOSIS — J449 Chronic obstructive pulmonary disease, unspecified: Secondary | ICD-10-CM | POA: Diagnosis not present

## 2020-10-09 DIAGNOSIS — F0150 Vascular dementia without behavioral disturbance: Secondary | ICD-10-CM | POA: Diagnosis not present

## 2020-11-21 DIAGNOSIS — F339 Major depressive disorder, recurrent, unspecified: Secondary | ICD-10-CM | POA: Diagnosis not present

## 2020-11-27 DIAGNOSIS — Z20828 Contact with and (suspected) exposure to other viral communicable diseases: Secondary | ICD-10-CM | POA: Diagnosis not present

## 2020-12-04 DIAGNOSIS — Z20828 Contact with and (suspected) exposure to other viral communicable diseases: Secondary | ICD-10-CM | POA: Diagnosis not present

## 2020-12-11 DIAGNOSIS — Z20828 Contact with and (suspected) exposure to other viral communicable diseases: Secondary | ICD-10-CM | POA: Diagnosis not present

## 2020-12-18 DIAGNOSIS — Z20828 Contact with and (suspected) exposure to other viral communicable diseases: Secondary | ICD-10-CM | POA: Diagnosis not present

## 2020-12-19 DIAGNOSIS — F339 Major depressive disorder, recurrent, unspecified: Secondary | ICD-10-CM | POA: Diagnosis not present

## 2021-01-30 DIAGNOSIS — B351 Tinea unguium: Secondary | ICD-10-CM | POA: Diagnosis not present

## 2021-01-30 DIAGNOSIS — M79674 Pain in right toe(s): Secondary | ICD-10-CM | POA: Diagnosis not present

## 2021-02-13 DIAGNOSIS — F339 Major depressive disorder, recurrent, unspecified: Secondary | ICD-10-CM | POA: Diagnosis not present

## 2021-03-13 DIAGNOSIS — F339 Major depressive disorder, recurrent, unspecified: Secondary | ICD-10-CM | POA: Diagnosis not present

## 2021-04-24 DIAGNOSIS — M79674 Pain in right toe(s): Secondary | ICD-10-CM | POA: Diagnosis not present

## 2021-04-24 DIAGNOSIS — B351 Tinea unguium: Secondary | ICD-10-CM | POA: Diagnosis not present

## 2021-04-24 DIAGNOSIS — M79675 Pain in left toe(s): Secondary | ICD-10-CM | POA: Diagnosis not present

## 2021-05-08 DIAGNOSIS — F339 Major depressive disorder, recurrent, unspecified: Secondary | ICD-10-CM | POA: Diagnosis not present

## 2021-05-18 ENCOUNTER — Emergency Department (HOSPITAL_COMMUNITY)
Admission: EM | Admit: 2021-05-18 | Discharge: 2021-05-18 | Disposition: A | Discharge status: Skilled Nursing Facility | Payer: Medicare Other | Attending: Emergency Medicine | Admitting: Emergency Medicine

## 2021-05-18 ENCOUNTER — Encounter (HOSPITAL_COMMUNITY): Payer: Self-pay | Admitting: Emergency Medicine

## 2021-05-18 ENCOUNTER — Emergency Department (HOSPITAL_COMMUNITY): Payer: Medicare Other

## 2021-05-18 ENCOUNTER — Other Ambulatory Visit: Payer: Self-pay

## 2021-05-18 DIAGNOSIS — I10 Essential (primary) hypertension: Secondary | ICD-10-CM | POA: Diagnosis not present

## 2021-05-18 DIAGNOSIS — F0390 Unspecified dementia without behavioral disturbance: Secondary | ICD-10-CM | POA: Insufficient documentation

## 2021-05-18 DIAGNOSIS — J811 Chronic pulmonary edema: Secondary | ICD-10-CM | POA: Diagnosis not present

## 2021-05-18 DIAGNOSIS — S61209A Unspecified open wound of unspecified finger without damage to nail, initial encounter: Secondary | ICD-10-CM

## 2021-05-18 DIAGNOSIS — Z8673 Personal history of transient ischemic attack (TIA), and cerebral infarction without residual deficits: Secondary | ICD-10-CM | POA: Diagnosis not present

## 2021-05-18 DIAGNOSIS — U071 COVID-19: Secondary | ICD-10-CM | POA: Insufficient documentation

## 2021-05-18 DIAGNOSIS — W19XXXA Unspecified fall, initial encounter: Secondary | ICD-10-CM | POA: Diagnosis not present

## 2021-05-18 DIAGNOSIS — Z87891 Personal history of nicotine dependence: Secondary | ICD-10-CM | POA: Insufficient documentation

## 2021-05-18 DIAGNOSIS — S61214A Laceration without foreign body of right ring finger without damage to nail, initial encounter: Secondary | ICD-10-CM | POA: Insufficient documentation

## 2021-05-18 DIAGNOSIS — R0902 Hypoxemia: Secondary | ICD-10-CM | POA: Diagnosis not present

## 2021-05-18 DIAGNOSIS — I25118 Atherosclerotic heart disease of native coronary artery with other forms of angina pectoris: Secondary | ICD-10-CM | POA: Insufficient documentation

## 2021-05-18 DIAGNOSIS — J441 Chronic obstructive pulmonary disease with (acute) exacerbation: Secondary | ICD-10-CM | POA: Insufficient documentation

## 2021-05-18 DIAGNOSIS — R0602 Shortness of breath: Secondary | ICD-10-CM | POA: Diagnosis not present

## 2021-05-18 DIAGNOSIS — Z79899 Other long term (current) drug therapy: Secondary | ICD-10-CM | POA: Insufficient documentation

## 2021-05-18 DIAGNOSIS — S61304A Unspecified open wound of right ring finger with damage to nail, initial encounter: Secondary | ICD-10-CM | POA: Diagnosis not present

## 2021-05-18 DIAGNOSIS — Z7401 Bed confinement status: Secondary | ICD-10-CM | POA: Diagnosis not present

## 2021-05-18 DIAGNOSIS — Z951 Presence of aortocoronary bypass graft: Secondary | ICD-10-CM | POA: Diagnosis not present

## 2021-05-18 DIAGNOSIS — R279 Unspecified lack of coordination: Secondary | ICD-10-CM | POA: Diagnosis not present

## 2021-05-18 DIAGNOSIS — S60944A Unspecified superficial injury of right ring finger, initial encounter: Secondary | ICD-10-CM | POA: Diagnosis present

## 2021-05-18 MED ORDER — ACETAMINOPHEN 500 MG PO TABS
1000.0000 mg | ORAL_TABLET | Freq: Once | ORAL | Status: AC
  Administered 2021-05-18: 1000 mg via ORAL
  Filled 2021-05-18: qty 2

## 2021-05-18 MED ORDER — POVIDONE-IODINE 10 % EX SOLN
CUTANEOUS | Status: AC
  Filled 2021-05-18: qty 15

## 2021-05-18 MED ORDER — LIDOCAINE HCL (PF) 1 % IJ SOLN
INTRAMUSCULAR | Status: AC
  Filled 2021-05-18: qty 5

## 2021-05-18 NOTE — ED Notes (Signed)
Report called to [facility] and daughter [name] called at [phone].

## 2021-05-18 NOTE — ED Provider Notes (Signed)
[facility] EMERGENCY DEPARTMENT Provider Note   CSN: [id number] Arrival date & time: 05/18/21  1142     History Chief Complaint  Patient presents with   Fall    Pt found on floor at [facility]. Pt was attempting to go to [facility], sound by staff c/o back pain.  CBG 130 with EMS.  VSS.  COVID positive on Tuesday at facility.  Pt is wheezing during assessment.  Sats 90-91 on RA.   Placed on O2@2l /m     Tyler Mejia is a 85 y.o. male.  HPI  Patient presents for fall and avulsion to right 4th finger.  He is COVID-positive since Tuesday last week.  He is on 90-91 on room air currently on 2 L of oxygen.  Does not have a history of COPD, asthma.  Reports feeling short of breath compared to his normal, and states he is not on oxygen at home in his nursing home.  He does not take any blood thinners.  His next tetanus is due 2030.    Past Medical History:  Diagnosis Date   Alzheimer's dementia [facility])    Atrial fibrillation, transient ([place])    a. Post-op from CABG 08/2015.   CAD (coronary artery disease)    a. DES to the RCA March 2013 with residual dz rx medically. b. s/p CABGx4 in 08/2015.   Collagen vascular disease ([place])    COPD (chronic obstructive pulmonary disease) (HCC)    Dysphagia    Essential hypertension    GERD (gastroesophageal reflux disease)    History of stroke    January 2017   HLD (hyperlipidemia)    a. H/o multiple statin intolerances.   Hx of adenomatous colonic polyps    Hyperlipidemia    IBS (irritable bowel syndrome)    Nephrolithiasis    NSTEMI (non-ST elevated myocardial infarction) [facility]) March 2013   Obesity    Stroke [facility])     Patient Active Problem List   Diagnosis Date Noted   Sepsis due to undetermined organism ([place]) 09/04/2017   Acute respiratory failure with hypoxia ([place]) 09/04/2017   COPD with acute exacerbation ([place]) 09/04/2017   Sepsis due to pneumonia ([place]) 09/03/2017   Abnormal ECG 09/03/2017   CAP (community acquired pneumonia) 09/03/2017    Dementia ([place]) 05/21/2016   Coronary artery disease involving coronary bypass graft of native heart with unspecified angina pectoris 02/29/2016   Cerebrovascular accident (CVA) due to bilateral thrombosis of middle cerebral arteries ([place]) 02/29/2016   CVA (cerebral infarction) 12/21/2015   Altered mental status 12/21/2015   Ileus, postoperative ([place]) 08/23/2015   S/P CABG x 4 08/11/2015   Obesity 08/10/2015   Accelerating angina ([place]) 08/09/2015   Mucosal abnormality of esophagus    Hiatal hernia    Dysphagia 03/15/2015   Schatzki's ring 03/15/2015   COPD (chronic obstructive pulmonary disease) ([place]) 08/21/2012   Coronary artery disease involving native coronary artery 05/20/2012   HLD (hyperlipidemia) 04/10/2012   Shortness of breath 02/28/2012   Essential hypertension 02/28/2012    Past Surgical History:  Procedure Laterality Date   APPENDECTOMY     BACK SURGERY     CARDIAC CATHETERIZATION N/A 08/09/2015   Procedure: Left Heart Cath and Coronary Angiography;  Surgeon: Jettie Booze, MD;  Location: [place] CV LAB;  Service: Cardiovascular;  Laterality: N/A;   COLONOSCOPY  08/21/2004   Pedunculated polyp at the splenic flexure   COLONOSCOPY  09/2009   Dr. Rourk--> Cecal and hepatic flexure polyps, tubular adenomas. Next  colonoscopy October 2013   COLONOSCOPY N/A 05/07/2013   RMR: colonic polyp/colonic diverticulosis   CORONARY ARTERY BYPASS GRAFT N/A 08/11/2015   Procedure: CORONARY ARTERY BYPASS GRAFTING (CABG) X4 UTILIZING THE LEFT INTERNAL MAMMARY ARTERY TO LAD AND ENDOSCOPICALLY HARVESTED BILATERAL SAPHENEOUS VEINS TO DIAGONAL,OM1 AND OM2;  Surgeon: Ivin Poot, MD;  Location: [place];  Service: Open Heart Surgery;  Laterality: N/A;   ESOPHAGOGASTRODUODENOSCOPY  2003   distal erosion/small hiatal hernia   ESOPHAGOGASTRODUODENOSCOPY  09/2009   Dr. Evalee Mutton ring, mild distal esophageal erosions, status post Maloney dilation, small hiatal hernia.    ESOPHAGOGASTRODUODENOSCOPY N/A 03/16/2015   AOZ:HYQMV HH/focal area of distal esophageal s/p bx   LEFT HEART CATHETERIZATION WITH CORONARY ANGIOGRAM N/A 03/02/2012   Procedure: LEFT HEART CATHETERIZATION WITH CORONARY ANGIOGRAM;  Surgeon: Peter M Martinique, MD;  Location: [facility] CATH LAB;  Service: Cardiovascular;  Laterality: N/A;   PERCUTANEOUS CORONARY STENT INTERVENTION (PCI-S) N/A 03/02/2012   Procedure: PERCUTANEOUS CORONARY STENT INTERVENTION (PCI-S);  Surgeon: Peter M Martinique, MD;  Location: [facility] CATH LAB;  Service: Cardiovascular;  Laterality: N/A;   TEE WITHOUT CARDIOVERSION N/A 08/11/2015   Procedure: TRANSESOPHAGEAL ECHOCARDIOGRAM (TEE);  Surgeon: Ivin Poot, MD;  Location: [place];  Service: Open Heart Surgery;  Laterality: N/A;       Family History  Problem Relation Age of Onset   Pneumonia Father    Heart disease Mother    Diabetes Mother    Stroke Mother    Stroke Sister    Breast cancer Sister        2 sisters   Esophageal cancer Brother    Colon cancer Neg Hx     Social History   Tobacco Use   Smoking status: Former    Packs/day: 1.50    Years: 45.00    Pack years: 67.50    Types: Cigarettes    Quit date: 12/09/2001    Years since quitting: 19.4   Smokeless tobacco: Never   Tobacco comments:    quit about 9 years ago  Substance Use Topics   Alcohol use: No    Alcohol/week: 0.0 standard drinks    Comment: Very rarely   Drug use: No    Home Medications Prior to Admission medications   Medication Sig Start Date End Date Taking? Authorizing Provider  acetaminophen (TYLENOL) 325 MG tablet Take 2 tablets (650 mg total) by mouth every 6 (six) hours as needed for mild pain, fever or headache. Patient taking differently: Take 325 mg by mouth every 6 (six) hours as needed for mild pain or fever.  08/24/15   Barrett, Erin R, PA-C  albuterol (PROVENTIL HFA;VENTOLIN HFA) 108 (90 Base) MCG/ACT inhaler Inhale 2 puffs into the lungs every 6 (six) hours as needed for wheezing or  shortness of breath. Patient taking differently: Inhale 2 puffs into the lungs 3 (three) times daily.  09/08/17   Orson Eva, MD  alum & mag hydroxide-simeth (MI-ACID) 200-200-20 MG/5ML suspension Take by mouth every 6 (six) hours as needed for indigestion or heartburn.    [provider]  divalproex (DEPAKOTE) 125 MG DR tablet Take 125 mg by mouth 3 (three) times daily.    [provider]  guaiFENesin (ROBITUSSIN) 100 MG/5ML SOLN Take 5 mLs by mouth every 4 (four) hours as needed for cough or to loosen phlegm.    [provider]  loperamide (IMODIUM) 2 MG capsule Take 2 mg by mouth every morning.     [provider]  risperiDONE (RISPERDAL) 0.5  MG tablet Take 0.5 mg by mouth at bedtime.    [provider]  sertraline (ZOLOFT) 25 MG tablet Take 25 mg by mouth every morning.     [provider]  tamsulosin (FLOMAX) 0.4 MG CAPS capsule Take 0.4 mg by mouth daily. 04/12/16   [provider]    Allergies    Codeine and Statins  Review of Systems   Review of Systems  Constitutional:  Positive for fever.  Respiratory:  Positive for cough and shortness of breath.   Skin:  Positive for wound.   Physical Exam Updated Vital Signs BP (!) 141/69 (BP Location: Left Arm)   Pulse 64   Temp 98.2 F (36.8 C) (Oral)   Resp (!) 24   Ht 5\' 9"  (1.753 m)   Wt 108.9 kg   SpO2 97%   BMI 35.44 kg/m   Physical Exam Vitals and nursing note reviewed. Exam conducted with a chaperone present.  Constitutional:      General: He is not in acute distress.    Appearance: Normal appearance.  HENT:     Head: Normocephalic and atraumatic.  Eyes:     General: No scleral icterus.    Extraocular Movements: Extraocular movements intact.     Pupils: Pupils are equal, round, and reactive to light.  Musculoskeletal:        General: Signs of injury present.     Comments: Slight avulsion to digit. Cap refill <2. Neurovascular in tact.   Skin:    Capillary  Refill: Capillary refill takes less than 2 seconds.     Coloration: Skin is not jaundiced.  Neurological:     General: No focal deficit present.     Mental Status: He is alert. Mental status is at baseline.     Coordination: Coordination normal.    ED Results / Procedures / Treatments   Labs (all labs ordered are listed, but only abnormal results are displayed) Labs Reviewed - No data to display  EKG None  Radiology No results found.  Procedures Procedures   Medications Ordered in ED Medications - No data to display  ED Course  I have reviewed the triage vital signs and the nursing notes.  Pertinent labs & imaging results that were available during my care of the patient were reviewed by me and considered in my medical decision making (see chart for details).    MDM Rules/Calculators/A&P                          Patient is an 85 year old male presenting for finger laceration due to fall.  He is also COVID-positive.  Patient is not due for tetanus at this time.  He is neurovascularly intact.  Radial pulses are 2+ bilaterally.  Range of motion is intact to fingers, wrist.  He is able to bend all of his fingers but has obvious pain when flexing. There is a mild avulsion to the digit. It is shallow, so I've opted for steri strips instead of suture repair.   Patient is COVID-positive, Which I attribute as the cause of his mild tachypnea to 29.  He was satting at 90 to 91% on room air on arrival.  He is currently on 2 L of oxygen in the room, but is not on room air at home.  I will get a chest x-ray to evaluate for complications.  We will also contact nursing home to see if they are able to put him on  home care if that is necessary.  I doubt the patient will need to be admitted, but there is some concern at this moment for his respiration.  1:24 PM - supplemental O2 removed. Will observe oxygen sat.   O2 sat at 96% on RA. Patient is appropriate for discharge at this time. Return  precautions given.   Final Clinical Impression(s) / ED Diagnoses Final diagnoses:  None    Rx / DC Orders ED Discharge Orders     None        Sherrill Raring, [place] 05/18/21 1731    Daleen Bo, MD 05/19/21 [phone]

## 2021-05-18 NOTE — ED Notes (Signed)
[facility] in to pick up pt.

## 2021-05-18 NOTE — Discharge Instructions (Addendum)
You were seen today for a injury to your finger.  The wound itself is shallow,instead of sutures, I have repaired it using Steri-Strips.  Please leave the Steri-Strips in place for the next 5 days.  The skin should approximate well and heal on its own.  If you develop fever, chills, increased pain with movement, swelling, pustulant drainage from the site of injury, please return back to the emergency department for further evaluation.  Please follow-up with your primary care provider in 1 week for wound recheck.  Additionally, due to COVID-19 virus you sounded short of breath.  On room air you were saturating at 90 to 91%.  Fever on 2 L of oxygen while here briefly.  You are able to maintain appropriate saturation when taken off the supplemental oxygen, so you are safe to go back to the nursing home.  However, if your oxygen rate drops down to 88, please return back to the ED for further evaluation.  May also use supplemental oxygen at home if that is able to bring your oxygen saturation back to an appropriate level.  I ordered a chest x-ray as part of your evaluation. there are some signs of vascular congestion, but nothing emergent. Please continue to rest and return back to the ED if you need to due to oxygenation problems.

## 2021-05-18 NOTE — ED Notes (Signed)
Dressing applied to right ring finger.

## 2021-05-18 NOTE — ED Notes (Signed)
Pt has laceration to right ring finger, dressing in place.  No active bleeding noted.

## 2021-05-23 DIAGNOSIS — U071 COVID-19: Secondary | ICD-10-CM | POA: Diagnosis not present

## 2021-05-23 DIAGNOSIS — F0150 Vascular dementia without behavioral disturbance: Secondary | ICD-10-CM | POA: Diagnosis not present

## 2021-05-23 DIAGNOSIS — J449 Chronic obstructive pulmonary disease, unspecified: Secondary | ICD-10-CM | POA: Diagnosis not present

## 2021-05-23 DIAGNOSIS — I10 Essential (primary) hypertension: Secondary | ICD-10-CM | POA: Diagnosis not present

## 2021-05-28 DIAGNOSIS — N390 Urinary tract infection, site not specified: Secondary | ICD-10-CM | POA: Diagnosis not present

## 2021-06-01 DIAGNOSIS — I10 Essential (primary) hypertension: Secondary | ICD-10-CM | POA: Diagnosis not present

## 2021-06-01 DIAGNOSIS — Z79899 Other long term (current) drug therapy: Secondary | ICD-10-CM | POA: Diagnosis not present

## 2021-06-06 DIAGNOSIS — N390 Urinary tract infection, site not specified: Secondary | ICD-10-CM | POA: Diagnosis not present

## 2021-06-06 DIAGNOSIS — F0150 Vascular dementia without behavioral disturbance: Secondary | ICD-10-CM | POA: Diagnosis not present

## 2021-06-06 DIAGNOSIS — U071 COVID-19: Secondary | ICD-10-CM | POA: Diagnosis not present

## 2021-06-06 DIAGNOSIS — M545 Low back pain, unspecified: Secondary | ICD-10-CM | POA: Diagnosis not present

## 2021-07-17 DIAGNOSIS — B351 Tinea unguium: Secondary | ICD-10-CM | POA: Diagnosis not present

## 2021-07-17 DIAGNOSIS — M79674 Pain in right toe(s): Secondary | ICD-10-CM | POA: Diagnosis not present

## 2021-08-08 DIAGNOSIS — F0150 Vascular dementia without behavioral disturbance: Secondary | ICD-10-CM | POA: Diagnosis not present

## 2021-08-08 DIAGNOSIS — I10 Essential (primary) hypertension: Secondary | ICD-10-CM | POA: Diagnosis not present

## 2021-08-08 DIAGNOSIS — M545 Low back pain, unspecified: Secondary | ICD-10-CM | POA: Diagnosis not present

## 2021-08-08 DIAGNOSIS — J449 Chronic obstructive pulmonary disease, unspecified: Secondary | ICD-10-CM | POA: Diagnosis not present

## 2021-09-21 DIAGNOSIS — I10 Essential (primary) hypertension: Secondary | ICD-10-CM | POA: Diagnosis not present

## 2021-09-26 DIAGNOSIS — J449 Chronic obstructive pulmonary disease, unspecified: Secondary | ICD-10-CM | POA: Diagnosis not present

## 2021-09-26 DIAGNOSIS — I10 Essential (primary) hypertension: Secondary | ICD-10-CM | POA: Diagnosis not present

## 2021-09-26 DIAGNOSIS — F0150 Vascular dementia without behavioral disturbance: Secondary | ICD-10-CM | POA: Diagnosis not present

## 2021-09-26 DIAGNOSIS — M545 Low back pain, unspecified: Secondary | ICD-10-CM | POA: Diagnosis not present

## 2021-10-05 ENCOUNTER — Other Ambulatory Visit: Payer: Self-pay

## 2021-10-05 ENCOUNTER — Telehealth: Payer: Self-pay | Admitting: Nurse Practitioner

## 2021-10-05 ENCOUNTER — Non-Acute Institutional Stay: Payer: Medicare Other | Admitting: Nurse Practitioner

## 2021-10-05 ENCOUNTER — Encounter: Payer: Self-pay | Admitting: Nurse Practitioner

## 2021-10-05 DIAGNOSIS — F0150 Vascular dementia without behavioral disturbance: Secondary | ICD-10-CM | POA: Diagnosis not present

## 2021-10-05 DIAGNOSIS — Z515 Encounter for palliative care: Secondary | ICD-10-CM | POA: Diagnosis not present

## 2021-10-05 DIAGNOSIS — R5381 Other malaise: Secondary | ICD-10-CM

## 2021-10-05 NOTE — Telephone Encounter (Signed)
Was actually a visit, error to open in telephone

## 2021-10-05 NOTE — Progress Notes (Signed)
[facility] Consult Note Telephone: [phone]  Fax: [phone]   Date of encounter: 10/05/21 9:19 PM PATIENT NAME: Tyler Mejia [address] [place] [address]   [phone] (home)  DOB: [birth year] MRN: [id number] PRIMARY CARE PROVIDER:   [facility] [name], MD,  [address] [place] [address] [phone]  RESPONSIBLE PARTY:    Contact Information     Name Relation Home Work [place] Daughter [phone]   [place], Shiffler [phone]  [phone]   Maye Hides Daughter   [phone]      I met face to face with patient in facility. Palliative Care was asked to follow this patient by consultation request of  [facility] to address advance care planning and complex medical decision making. This is the initial visit.                      ASSESSMENT AND PLAN / RECOMMENDATIONS:  Symptom Management/Plan: 1. Advance Care Planning; DNR further discussion pending goc  2. Debility secondary to multiple cva's, alzheimers/vascular dementia. Continue to encourage Tyler Sortino to go to dining room, activities, oob. Continue restorative rom as able with staff.   3. Palliative care encounter; Palliative care encounter; Palliative medicine team will continue to support patient, patient's family, and medical team. Visit consisted of counseling and education dealing with the complex and emotionally intense issues of symptom management and palliative care in the setting of serious and potentially life-threatening illness  4. f/u 1 month for ongoing monitoring chronic disease progression, ongoing discussions complex medical decision making   Follow up Palliative Care Visit: Palliative care will continue to follow for complex medical decision making, advance care planning, and clarification of goals. Return 4 weeks or prn.  I spent 68 minutes providing this  consultation. More than 50% of the time in this consultation was spent in counseling and care coordination.  PPS: 40%  Chief Complaint: Initial Palliative consult for complex medical decision making  HISTORY OF PRESENT ILLNESS:  Tyler Mejia is a 85 y.o. year old male  with Alzheimer's/vascular dementia, hypertension, CAD s/p status post CABG, paroxysmal A. fib, left basal ganglia stroke (in the setting of remote right basal ganglia stroke and thus decompensation of this system due to bilateral lesions), COPD, dysphagia, GERD, HLD, Hx of adenomatous colonic polyps, IBS, h/o nephrolithiasis, obesity. Tyler. Kimmons resides at [facility]. Tyler Mejia requires staff assistance for transfers, mobility, adl's including bathing, dressing toileting. Tyler. Mejia requires assistance with tray setup, feeds himself. Current weight 230.8 lbs. Tyler. Mejia is a DNR. No recent falls, wounds, infections, hospitalizations per staff. At present Tyler. Mejia is sitting in the dining area eating his lunch, feeding himself. Tyler. Asman made brief eye contact, answered simple questions. No back and forth conversation due to cognitive impairment. Tyler. Heber was cooperative with assessment. Attempted to talked about life review, limited answers. Emotional support provided. Explained PC though limited. I attempted to contact [place], Tyler. [place] daughter, message left for further discussion of goc, pc visit with contact information. Updated staff.   History obtained from review of EMR, discussion with facility staff and  Tyler. Mejia.  I reviewed available labs, medications, imaging, studies and related documents from the EMR.  Records reviewed and summarized above.   ROS Full 10 system review of systems performed and negative with exception of: as per HPI.   Physical Exam: Constitutional: NAD General: debilitated,  chronically ill appearing male EYES: lids intact ENMT:  oral mucous membranes moist CV: S1S2, RRR Pulmonary: LCTA, no increased  work of breathing, no cough, room air Abdomen: normo-active BS + 4 quadrants, soft and non tender MSK: w/c dependent Skin: warm and dry Neuro:  + generalized weakness,  + cognitive impairment Psych: flat affect, A and Oriented to self  CURRENT PROBLEM LIST:  Patient Active Problem List   Diagnosis Date Noted   Sepsis due to undetermined organism ([place]) 09/04/2017   Acute respiratory failure with hypoxia ([place]) 09/04/2017   COPD with acute exacerbation ([place]) 09/04/2017   Sepsis due to pneumonia ([place]) 09/03/2017   Abnormal ECG 09/03/2017   CAP (community acquired pneumonia) 09/03/2017   Dementia ([place]) 05/21/2016   Coronary artery disease involving coronary bypass graft of native heart with unspecified angina pectoris 02/29/2016   Cerebrovascular accident (CVA) due to bilateral thrombosis of middle cerebral arteries ([place]) 02/29/2016   CVA (cerebral infarction) 12/21/2015   Altered mental status 12/21/2015   Ileus, postoperative ([place]) 08/23/2015   S/P CABG x 4 08/11/2015   Obesity 08/10/2015   Accelerating angina ([place]) 08/09/2015   Mucosal abnormality of esophagus    Hiatal hernia    Dysphagia 03/15/2015   Schatzki's ring 03/15/2015   COPD (chronic obstructive pulmonary disease) ([place]) 08/21/2012   Coronary artery disease involving native coronary artery 05/20/2012   HLD (hyperlipidemia) 04/10/2012   Shortness of breath 02/28/2012   Essential hypertension 02/28/2012   PAST MEDICAL HISTORY:  Active Ambulatory Problems    Diagnosis Date Noted   Shortness of breath 02/28/2012   Essential hypertension 02/28/2012   HLD (hyperlipidemia) 04/10/2012   Coronary artery disease involving native coronary artery 05/20/2012   COPD (chronic obstructive pulmonary disease) ([place]) 08/21/2012   Dysphagia 03/15/2015   Schatzki's ring 03/15/2015   Mucosal abnormality of esophagus    Hiatal hernia    Accelerating angina ([place]) 08/09/2015   Obesity 08/10/2015   S/P CABG x 4 08/11/2015   Ileus,  postoperative ([place]) 08/23/2015   CVA (cerebral infarction) 12/21/2015   Altered mental status 12/21/2015   Coronary artery disease involving coronary bypass graft of native heart with unspecified angina pectoris 02/29/2016   Cerebrovascular accident (CVA) due to bilateral thrombosis of middle cerebral arteries ([place]) 02/29/2016   Dementia ([place]) 05/21/2016   Sepsis due to pneumonia ([place]) 09/03/2017   Abnormal ECG 09/03/2017   CAP (community acquired pneumonia) 09/03/2017   Sepsis due to undetermined organism ([place]) 09/04/2017   Acute respiratory failure with hypoxia ([place]) 09/04/2017   COPD with acute exacerbation ([place]) 09/04/2017   Resolved Ambulatory Problems    Diagnosis Date Noted   DYSPHAGIA UNSPECIFIED 09/08/2009   COLONIC POLYPS, HX OF 09/08/2009   GERD (gastroesophageal reflux disease) 02/28/2012   Fatigue 02/28/2012   Anorexia 02/28/2012   NSTEMI (non-ST elevated myocardial infarction) ([place]) 02/28/2012   Chronic diarrhea 10/29/2012   Coronary artery disease involving native coronary artery of native heart with angina pectoris ([place])    Coronary artery disease involving native coronary artery of native heart without angina pectoris    Past Medical History:  Diagnosis Date   Alzheimer's dementia [facility])    Atrial fibrillation, transient (HCC)    CAD (coronary artery disease)    Collagen vascular disease ([place])    History of stroke    Hx of adenomatous colonic polyps    Hyperlipidemia    IBS (irritable bowel syndrome)    Nephrolithiasis    Stroke [facility])    SOCIAL HX:  Social  History   Tobacco Use   Smoking status: Former    Packs/day: 1.50    Years: 45.00    Pack years: 67.50    Types: Cigarettes    Quit date: 12/09/2001    Years since quitting: 19.8   Smokeless tobacco: Never   Tobacco comments:    quit about 9 years ago  Substance Use Topics   Alcohol use: No    Alcohol/week: 0.0 standard drinks    Comment: Very rarely   FAMILY HX:  Family History  Problem  Relation Age of Onset   Pneumonia Father    Heart disease Mother    Diabetes Mother    Stroke Mother    Stroke Sister    Breast cancer Sister        2 sisters   Esophageal cancer Brother    Colon cancer Neg Hx     reviewed  ALLERGIES:  Allergies  Allergen Reactions   Codeine Other (See Comments)    Pt does not know    Statins Other (See Comments) and Nausea And Vomiting     PERTINENT MEDICATIONS:  Outpatient Encounter Medications as of 10/05/2021  Medication Sig   acetaminophen (TYLENOL) 325 MG tablet Take 2 tablets (650 mg total) by mouth every 6 (six) hours as needed for mild pain, fever or headache. (Patient taking differently: Take 325 mg by mouth every 6 (six) hours as needed for mild pain or fever. )   albuterol (PROVENTIL HFA;VENTOLIN HFA) 108 (90 Base) MCG/ACT inhaler Inhale 2 puffs into the lungs every 6 (six) hours as needed for wheezing or shortness of breath. (Patient taking differently: Inhale 2 puffs into the lungs 3 (three) times daily. )   alum & mag hydroxide-simeth (MI-ACID) 200-200-20 MG/5ML suspension Take by mouth every 6 (six) hours as needed for indigestion or heartburn.   divalproex (DEPAKOTE) 125 MG DR tablet Take 125 mg by mouth 3 (three) times daily.   guaiFENesin (ROBITUSSIN) 100 MG/5ML SOLN Take 5 mLs by mouth every 4 (four) hours as needed for cough or to loosen phlegm.   loperamide (IMODIUM) 2 MG capsule Take 2 mg by mouth every morning.    risperiDONE (RISPERDAL) 0.5 MG tablet Take 0.5 mg by mouth at bedtime.   sertraline (ZOLOFT) 25 MG tablet Take 25 mg by mouth every morning.    tamsulosin (FLOMAX) 0.4 MG CAPS capsule Take 0.4 mg by mouth daily.   No facility-administered encounter medications on file as of 10/05/2021.  Questions and concerns were addressed. The patient/facility staff was encouraged to call with questions and/or concerns. My business card was provided. Provided general support and encouragement, no other unmet needs identified    Thank you for the opportunity to participate in the care of Tyler. Dalesandro.  The palliative care team will continue to follow. Please call our office at [phone] if we can be of additional assistance.   This chart was dictated using voice recognition software.  Despite best efforts to proofread,  errors can occur which can change the documentation meaning.   Miron Marxen Z Norton Bivins, NP ,

## 2021-10-08 ENCOUNTER — Telehealth: Payer: Self-pay | Admitting: Nurse Practitioner

## 2021-10-08 NOTE — Telephone Encounter (Signed)
Received call from Tyler Mejia, Tyler Mejia daughter. Clinical update discussed. We talked about pc visit with Tyler Mejia, past medical history, the last time Tyler Mejia was independent. We talked about living at [facility] and transition to [facility]. We talked about symptoms including sob, pain. We reviewed medication list. We talked about appetite, slight weight loss. We talked about chronic disease progression late onset CVA, COPD. We talked about his functional and cognitive abilities. We talked about facility with wishes for careplan meeting with facility. Discussed will pass onto [facility]. We talked about medical goals of care, reviewed MOST form and Tyler Mejia is a DNR. We talked about HCPOA which is Tyler Mejia. We talked about life review. Tyler Mejia has 2 daughters and 1 son. We talked about role pc in poc. Discussed will f/u in 1 month, sooner if declines. Will request labs. Tyler Mejia in agreement.   Total time 20 minutes Phone discussion 15 minutes Documentation 5 minutes

## 2021-10-19 DIAGNOSIS — Z111 Encounter for screening for respiratory tuberculosis: Secondary | ICD-10-CM | POA: Diagnosis not present

## 2021-10-24 DIAGNOSIS — R0981 Nasal congestion: Secondary | ICD-10-CM | POA: Diagnosis not present

## 2021-10-24 DIAGNOSIS — I10 Essential (primary) hypertension: Secondary | ICD-10-CM | POA: Diagnosis not present

## 2021-10-24 DIAGNOSIS — J449 Chronic obstructive pulmonary disease, unspecified: Secondary | ICD-10-CM | POA: Diagnosis not present

## 2021-10-24 DIAGNOSIS — R269 Unspecified abnormalities of gait and mobility: Secondary | ICD-10-CM | POA: Diagnosis not present

## 2021-11-08 DIAGNOSIS — F0150 Vascular dementia without behavioral disturbance: Secondary | ICD-10-CM | POA: Diagnosis not present

## 2021-11-08 DIAGNOSIS — J441 Chronic obstructive pulmonary disease with (acute) exacerbation: Secondary | ICD-10-CM | POA: Diagnosis not present

## 2021-11-08 DIAGNOSIS — R0989 Other specified symptoms and signs involving the circulatory and respiratory systems: Secondary | ICD-10-CM | POA: Diagnosis not present

## 2021-11-08 DIAGNOSIS — L89322 Pressure ulcer of left buttock, stage 2: Secondary | ICD-10-CM | POA: Diagnosis not present

## 2021-11-08 DIAGNOSIS — R062 Wheezing: Secondary | ICD-10-CM | POA: Diagnosis not present

## 2021-11-08 DIAGNOSIS — I10 Essential (primary) hypertension: Secondary | ICD-10-CM | POA: Diagnosis not present

## 2021-11-09 DIAGNOSIS — E785 Hyperlipidemia, unspecified: Secondary | ICD-10-CM | POA: Diagnosis not present

## 2021-11-09 DIAGNOSIS — E039 Hypothyroidism, unspecified: Secondary | ICD-10-CM | POA: Diagnosis not present

## 2021-11-09 DIAGNOSIS — D519 Vitamin B12 deficiency anemia, unspecified: Secondary | ICD-10-CM | POA: Diagnosis not present

## 2021-11-09 DIAGNOSIS — Z79899 Other long term (current) drug therapy: Secondary | ICD-10-CM | POA: Diagnosis not present

## 2021-11-12 DIAGNOSIS — J449 Chronic obstructive pulmonary disease, unspecified: Secondary | ICD-10-CM | POA: Diagnosis not present

## 2021-11-12 DIAGNOSIS — Z87891 Personal history of nicotine dependence: Secondary | ICD-10-CM | POA: Diagnosis not present

## 2021-11-12 DIAGNOSIS — R32 Unspecified urinary incontinence: Secondary | ICD-10-CM | POA: Diagnosis not present

## 2021-11-12 DIAGNOSIS — L259 Unspecified contact dermatitis, unspecified cause: Secondary | ICD-10-CM | POA: Diagnosis not present

## 2021-11-12 DIAGNOSIS — I10 Essential (primary) hypertension: Secondary | ICD-10-CM | POA: Diagnosis not present

## 2021-11-12 DIAGNOSIS — Z8673 Personal history of transient ischemic attack (TIA), and cerebral infarction without residual deficits: Secondary | ICD-10-CM | POA: Diagnosis not present

## 2021-11-12 DIAGNOSIS — F32A Depression, unspecified: Secondary | ICD-10-CM | POA: Diagnosis not present

## 2021-11-12 DIAGNOSIS — K219 Gastro-esophageal reflux disease without esophagitis: Secondary | ICD-10-CM | POA: Diagnosis not present

## 2021-11-12 DIAGNOSIS — R1310 Dysphagia, unspecified: Secondary | ICD-10-CM | POA: Diagnosis not present

## 2021-11-12 DIAGNOSIS — F39 Unspecified mood [affective] disorder: Secondary | ICD-10-CM | POA: Diagnosis not present

## 2021-11-12 DIAGNOSIS — F0153 Vascular dementia, unspecified severity, with mood disturbance: Secondary | ICD-10-CM | POA: Diagnosis not present

## 2021-11-12 DIAGNOSIS — N390 Urinary tract infection, site not specified: Secondary | ICD-10-CM | POA: Diagnosis not present

## 2021-11-12 DIAGNOSIS — J069 Acute upper respiratory infection, unspecified: Secondary | ICD-10-CM | POA: Diagnosis not present

## 2021-11-15 DIAGNOSIS — J441 Chronic obstructive pulmonary disease with (acute) exacerbation: Secondary | ICD-10-CM | POA: Diagnosis not present

## 2021-11-15 DIAGNOSIS — N3000 Acute cystitis without hematuria: Secondary | ICD-10-CM | POA: Diagnosis not present

## 2021-11-15 DIAGNOSIS — N401 Enlarged prostate with lower urinary tract symptoms: Secondary | ICD-10-CM | POA: Diagnosis not present

## 2021-11-15 DIAGNOSIS — F0150 Vascular dementia without behavioral disturbance: Secondary | ICD-10-CM | POA: Diagnosis not present

## 2021-11-26 DIAGNOSIS — G4733 Obstructive sleep apnea (adult) (pediatric): Secondary | ICD-10-CM | POA: Diagnosis not present

## 2021-11-26 DIAGNOSIS — I130 Hypertensive heart and chronic kidney disease with heart failure and stage 1 through stage 4 chronic kidney disease, or unspecified chronic kidney disease: Secondary | ICD-10-CM | POA: Diagnosis not present

## 2021-12-10 DIAGNOSIS — F0153 Vascular dementia, unspecified severity, with mood disturbance: Secondary | ICD-10-CM | POA: Diagnosis not present

## 2021-12-10 DIAGNOSIS — F32A Depression, unspecified: Secondary | ICD-10-CM | POA: Diagnosis not present

## 2021-12-10 DIAGNOSIS — J069 Acute upper respiratory infection, unspecified: Secondary | ICD-10-CM | POA: Diagnosis not present

## 2021-12-13 DIAGNOSIS — F331 Major depressive disorder, recurrent, moderate: Secondary | ICD-10-CM | POA: Diagnosis not present

## 2021-12-13 DIAGNOSIS — I129 Hypertensive chronic kidney disease with stage 1 through stage 4 chronic kidney disease, or unspecified chronic kidney disease: Secondary | ICD-10-CM | POA: Diagnosis not present

## 2021-12-13 DIAGNOSIS — F0153 Vascular dementia, unspecified severity, with mood disturbance: Secondary | ICD-10-CM | POA: Diagnosis not present

## 2021-12-13 DIAGNOSIS — N1831 Chronic kidney disease, stage 3a: Secondary | ICD-10-CM | POA: Diagnosis not present

## 2021-12-19 DIAGNOSIS — K219 Gastro-esophageal reflux disease without esophagitis: Secondary | ICD-10-CM | POA: Diagnosis not present

## 2021-12-19 DIAGNOSIS — K589 Irritable bowel syndrome without diarrhea: Secondary | ICD-10-CM | POA: Diagnosis not present

## 2021-12-19 DIAGNOSIS — J449 Chronic obstructive pulmonary disease, unspecified: Secondary | ICD-10-CM | POA: Diagnosis not present

## 2021-12-19 DIAGNOSIS — I252 Old myocardial infarction: Secondary | ICD-10-CM | POA: Diagnosis not present

## 2021-12-19 DIAGNOSIS — F331 Major depressive disorder, recurrent, moderate: Secondary | ICD-10-CM | POA: Diagnosis not present

## 2021-12-19 DIAGNOSIS — I4891 Unspecified atrial fibrillation: Secondary | ICD-10-CM | POA: Diagnosis not present

## 2021-12-19 DIAGNOSIS — F0282 Dementia in other diseases classified elsewhere, unspecified severity, with psychotic disturbance: Secondary | ICD-10-CM | POA: Diagnosis not present

## 2021-12-19 DIAGNOSIS — I69315 Cognitive social or emotional deficit following cerebral infarction: Secondary | ICD-10-CM | POA: Diagnosis not present

## 2021-12-19 DIAGNOSIS — I1310 Hypertensive heart and chronic kidney disease without heart failure, with stage 1 through stage 4 chronic kidney disease, or unspecified chronic kidney disease: Secondary | ICD-10-CM | POA: Diagnosis not present

## 2021-12-19 DIAGNOSIS — F0283 Dementia in other diseases classified elsewhere, unspecified severity, with mood disturbance: Secondary | ICD-10-CM | POA: Diagnosis not present

## 2021-12-19 DIAGNOSIS — F0153 Vascular dementia, unspecified severity, with mood disturbance: Secondary | ICD-10-CM | POA: Diagnosis not present

## 2021-12-19 DIAGNOSIS — N1831 Chronic kidney disease, stage 3a: Secondary | ICD-10-CM | POA: Diagnosis not present

## 2021-12-19 DIAGNOSIS — I2510 Atherosclerotic heart disease of native coronary artery without angina pectoris: Secondary | ICD-10-CM | POA: Diagnosis not present

## 2021-12-19 DIAGNOSIS — G309 Alzheimer's disease, unspecified: Secondary | ICD-10-CM | POA: Diagnosis not present

## 2021-12-19 DIAGNOSIS — I6931 Attention and concentration deficit following cerebral infarction: Secondary | ICD-10-CM | POA: Diagnosis not present

## 2021-12-19 DIAGNOSIS — F0152 Vascular dementia, unspecified severity, with psychotic disturbance: Secondary | ICD-10-CM | POA: Diagnosis not present

## 2021-12-27 DIAGNOSIS — I129 Hypertensive chronic kidney disease with stage 1 through stage 4 chronic kidney disease, or unspecified chronic kidney disease: Secondary | ICD-10-CM | POA: Diagnosis not present

## 2021-12-27 DIAGNOSIS — N401 Enlarged prostate with lower urinary tract symptoms: Secondary | ICD-10-CM | POA: Diagnosis not present

## 2021-12-27 DIAGNOSIS — F331 Major depressive disorder, recurrent, moderate: Secondary | ICD-10-CM | POA: Diagnosis not present

## 2021-12-27 DIAGNOSIS — N1831 Chronic kidney disease, stage 3a: Secondary | ICD-10-CM | POA: Diagnosis not present

## 2021-12-31 DIAGNOSIS — I130 Hypertensive heart and chronic kidney disease with heart failure and stage 1 through stage 4 chronic kidney disease, or unspecified chronic kidney disease: Secondary | ICD-10-CM | POA: Diagnosis not present

## 2021-12-31 DIAGNOSIS — I5032 Chronic diastolic (congestive) heart failure: Secondary | ICD-10-CM | POA: Diagnosis not present

## 2021-12-31 DIAGNOSIS — N1831 Chronic kidney disease, stage 3a: Secondary | ICD-10-CM | POA: Diagnosis not present

## 2021-12-31 DIAGNOSIS — G4733 Obstructive sleep apnea (adult) (pediatric): Secondary | ICD-10-CM | POA: Diagnosis not present

## 2022-01-02 DIAGNOSIS — I499 Cardiac arrhythmia, unspecified: Secondary | ICD-10-CM | POA: Diagnosis not present

## 2022-01-02 DIAGNOSIS — R931 Abnormal findings on diagnostic imaging of heart and coronary circulation: Secondary | ICD-10-CM | POA: Diagnosis not present

## 2022-01-02 DIAGNOSIS — R0989 Other specified symptoms and signs involving the circulatory and respiratory systems: Secondary | ICD-10-CM | POA: Diagnosis not present

## 2022-01-04 DIAGNOSIS — J449 Chronic obstructive pulmonary disease, unspecified: Secondary | ICD-10-CM | POA: Diagnosis not present

## 2022-01-04 DIAGNOSIS — G4733 Obstructive sleep apnea (adult) (pediatric): Secondary | ICD-10-CM | POA: Diagnosis not present

## 2022-01-07 ENCOUNTER — Other Ambulatory Visit: Payer: Self-pay

## 2022-01-07 ENCOUNTER — Emergency Department (HOSPITAL_COMMUNITY): Payer: Medicare Other

## 2022-01-07 ENCOUNTER — Emergency Department (HOSPITAL_COMMUNITY)
Admission: EM | Admit: 2022-01-07 | Discharge: 2022-01-07 | Disposition: A | Discharge status: Home / Self Care | Payer: Medicare Other | Attending: Emergency Medicine | Admitting: Emergency Medicine

## 2022-01-07 ENCOUNTER — Encounter (HOSPITAL_COMMUNITY): Payer: Self-pay

## 2022-01-07 DIAGNOSIS — R200 Anesthesia of skin: Secondary | ICD-10-CM | POA: Diagnosis not present

## 2022-01-07 DIAGNOSIS — I2510 Atherosclerotic heart disease of native coronary artery without angina pectoris: Secondary | ICD-10-CM | POA: Diagnosis not present

## 2022-01-07 DIAGNOSIS — I639 Cerebral infarction, unspecified: Secondary | ICD-10-CM | POA: Diagnosis not present

## 2022-01-07 DIAGNOSIS — I6782 Cerebral ischemia: Secondary | ICD-10-CM | POA: Diagnosis not present

## 2022-01-07 DIAGNOSIS — F0390 Unspecified dementia without behavioral disturbance: Secondary | ICD-10-CM | POA: Diagnosis not present

## 2022-01-07 DIAGNOSIS — Z20822 Contact with and (suspected) exposure to covid-19: Secondary | ICD-10-CM | POA: Insufficient documentation

## 2022-01-07 DIAGNOSIS — J329 Chronic sinusitis, unspecified: Secondary | ICD-10-CM | POA: Diagnosis not present

## 2022-01-07 DIAGNOSIS — R5381 Other malaise: Secondary | ICD-10-CM | POA: Diagnosis not present

## 2022-01-07 DIAGNOSIS — R531 Weakness: Secondary | ICD-10-CM | POA: Diagnosis not present

## 2022-01-07 DIAGNOSIS — R42 Dizziness and giddiness: Secondary | ICD-10-CM | POA: Diagnosis not present

## 2022-01-07 DIAGNOSIS — R059 Cough, unspecified: Secondary | ICD-10-CM | POA: Insufficient documentation

## 2022-01-07 DIAGNOSIS — G319 Degenerative disease of nervous system, unspecified: Secondary | ICD-10-CM | POA: Diagnosis not present

## 2022-01-07 LAB — COMPREHENSIVE METABOLIC PANEL
ALT: 15 U/L (ref 0–44)
AST: 18 U/L (ref 15–41)
Albumin: 3.3 g/dL — ABNORMAL LOW (ref 3.5–5.0)
Alkaline Phosphatase: 70 U/L (ref 38–126)
Anion gap: 5 (ref 5–15)
BUN: 22 mg/dL (ref 8–23)
CO2: 28 mmol/L (ref 22–32)
Calcium: 8.5 mg/dL — ABNORMAL LOW (ref 8.9–10.3)
Chloride: 104 mmol/L (ref 98–111)
Creatinine, Ser: 0.87 mg/dL (ref 0.61–1.24)
GFR, Estimated: >60 mL/min (ref >60)
Glucose, Bld: 92 mg/dL (ref 70–99)
Potassium: 4.2 mmol/L (ref 3.5–5.1)
Sodium: 137 mmol/L (ref 135–145)
Total Bilirubin: 0.6 mg/dL (ref 0.3–1.2)
Total Protein: 6.5 g/dL (ref 6.5–8.1)

## 2022-01-07 LAB — CBC WITH DIFFERENTIAL/PLATELET
Abs Immature Granulocytes: 0.05 10*3/uL (ref 0.00–0.07)
Basophils Absolute: 0.1 10*3/uL (ref 0.0–0.1)
Basophils Relative: 1 %
Eosinophils Absolute: 0.4 10*3/uL (ref 0.0–0.5)
Eosinophils Relative: 5 %
HCT: 42.9 % (ref 39.0–52.0)
Hemoglobin: 13.4 g/dL (ref 13.0–17.0)
Immature Granulocytes: 1 %
Lymphocytes Relative: 21 %
Lymphs Abs: 1.8 10*3/uL (ref 0.7–4.0)
MCH: 27.9 pg (ref 26.0–34.0)
MCHC: 31.2 g/dL (ref 30.0–36.0)
MCV: 89.2 fL (ref 80.0–100.0)
Monocytes Absolute: 0.9 10*3/uL (ref 0.1–1.0)
Monocytes Relative: 11 %
Neutro Abs: 5.1 10*3/uL (ref 1.7–7.7)
Neutrophils Relative %: 61 %
Platelets: 177 10*3/uL (ref 150–400)
RBC: 4.81 MIL/uL (ref 4.22–5.81)
RDW: 12.8 % (ref 11.5–15.5)
WBC: 8.3 10*3/uL (ref 4.0–10.5)
nRBC: 0 % (ref 0.0–0.2)

## 2022-01-07 LAB — TROPONIN I (HIGH SENSITIVITY)
Troponin I (High Sensitivity): 10 ng/L (ref <18)
Troponin I (High Sensitivity): 13 ng/L (ref <18)

## 2022-01-07 LAB — RESP PANEL BY RT-PCR (FLU A&B, COVID) ARPGX2
Influenza A by PCR: NEGATIVE
Influenza B by PCR: NEGATIVE
SARS Coronavirus 2 by RT PCR: NEGATIVE

## 2022-01-07 LAB — CBG MONITORING, ED: Glucose-Capillary: 101 mg/dL — ABNORMAL HIGH (ref 70–99)

## 2022-01-07 MED ORDER — SODIUM CHLORIDE 0.9 % IV BOLUS
500.0000 mL | Freq: Once | INTRAVENOUS | Status: AC
  Administered 2022-01-07: 500 mL via INTRAVENOUS

## 2022-01-07 NOTE — Discharge Instructions (Signed)
You were seen in the emergency department for an episode of dizziness.  You had lab work EKG chest x-ray and a CAT scan of your head that did not show any obvious explanation for your symptoms.  Please follow-up with your primary care doctor.  Return to the emergency department if any worsening or concerning symptoms

## 2022-01-07 NOTE — ED Provider Notes (Signed)
[facility] EMERGENCY DEPARTMENT Provider Note   CSN: [id number] Arrival date & time: 01/07/22  1438     History  Chief Complaint  Patient presents with   Dizziness    Tyler Mejia is a 86 y.o. male.  Level 5 caveat secondary to dementia.  Patient is brought in by EMS from his facility for evaluation of dizziness and cough.  Patient denies any dizziness or cough.  No headache shortness of breath abdominal pain nausea vomiting diarrhea.  Says he has some chronic numbness in his left leg, no weakness.  History of CVA coronary disease  The history is provided by the patient.  Dizziness Quality:  Unable to specify Severity:  Unable to specify Onset quality:  Unable to specify Progression:  Resolved Context: physical activity   Relieved by:  None tried Worsened by:  Nothing Ineffective treatments:  None tried Associated symptoms: no chest pain, no headaches, no nausea, no shortness of breath, no syncope, no vomiting and no weakness   Risk factors: hx of stroke       Home Medications Prior to Admission medications   Medication Sig Start Date End Date Taking? Authorizing Provider  acetaminophen (TYLENOL) 325 MG tablet Take 2 tablets (650 mg total) by mouth every 6 (six) hours as needed for mild pain, fever or headache. Patient taking differently: Take 325 mg by mouth every 6 (six) hours as needed for mild pain or fever. 08/24/15  Yes Barrett, Erin R, PA-C  albuterol (PROVENTIL HFA;VENTOLIN HFA) 108 (90 Base) MCG/ACT inhaler Inhale 2 puffs into the lungs every 6 (six) hours as needed for wheezing or shortness of breath. Patient taking differently: Inhale 2 puffs into the lungs 3 (three) times daily. 09/08/17  Yes Tat, Shanon Brow, MD  nystatin cream (MYCOSTATIN) Apply 1 application topically 2 (two) times daily. 10 day course for groin redness/irritation   Yes [provider]  risperiDONE (RISPERDAL) 0.5 MG tablet Take 0.5 mg by mouth 2 (two) times daily.   Yes [provider]  sertraline (ZOLOFT) 25 MG tablet Take 50 mg by mouth every morning.   Yes [provider]  tamsulosin (FLOMAX) 0.4 MG CAPS capsule Take 0.4 mg by mouth daily. 04/12/16  Yes [provider]  Zinc Oxide 12 % CREA Apply 1 application topically 3 (three) times daily as needed (redness).   Yes [provider]      Allergies    Codeine and Statins    Review of Systems   Review of Systems  Unable to perform ROS: Dementia  Respiratory:  Negative for shortness of breath.   Cardiovascular:  Negative for chest pain and syncope.  Gastrointestinal:  Negative for nausea and vomiting.  Neurological:  Positive for dizziness. Negative for weakness and headaches.   Physical Exam Updated Vital Signs BP 124/69 (BP Location: Left Arm)    Pulse 72    Temp 98.2 F (36.8 C) (Oral)    Resp 20    Ht 6\' 1"  (1.854 m)    Wt 106.6 kg    SpO2 95%    BMI 31.00 kg/m  Physical Exam Vitals and nursing note reviewed.  Constitutional:      General: He is not in acute distress.    Appearance: Normal appearance. He is well-developed.  HENT:     Head: Normocephalic and atraumatic.  Eyes:     Conjunctiva/sclera: Conjunctivae normal.  Cardiovascular:     Rate and Rhythm: Normal rate and regular rhythm.     Heart sounds:  No murmur heard. Pulmonary:     Effort: Pulmonary effort is normal. No respiratory distress.     Breath sounds: Normal breath sounds.  Abdominal:     Palpations: Abdomen is soft.     Tenderness: There is no abdominal tenderness. There is no guarding or rebound.  Musculoskeletal:        General: No swelling.     Cervical back: Neck supple.     Right lower leg: No edema.     Left lower leg: No edema.  Skin:    General: Skin is warm and dry.     Capillary Refill: Capillary refill takes less than 2 seconds.  Neurological:     General: No focal deficit present.     Mental Status: He is alert. He is disoriented.     Cranial Nerves: No cranial nerve deficit.      Sensory: No sensory deficit.     Motor: No weakness.  Psychiatric:        Mood and Affect: Mood normal.    ED Results / Procedures / Treatments   Labs (all labs ordered are listed, but only abnormal results are displayed) Labs Reviewed  COMPREHENSIVE METABOLIC PANEL - Abnormal; Notable for the following components:      Result Value   Calcium 8.5 (*)    Albumin 3.3 (*)    All other components within normal limits  CBG MONITORING, ED - Abnormal; Notable for the following components:   Glucose-Capillary 101 (*)    All other components within normal limits  RESP PANEL BY RT-PCR (FLU A&B, COVID) [id number]  CBC WITH DIFFERENTIAL/PLATELET  TROPONIN I (HIGH SENSITIVITY)  TROPONIN I (HIGH SENSITIVITY)    EKG EKG Interpretation  Date/Time:  Monday January 07 2022 18:25:26 EST Ventricular Rate:  67 PR Interval:  156 QRS Duration: 101 QT Interval:  370 QTC Calculation: 391 R Axis:   64 Text Interpretation: Sinus or ectopic atrial rhythm Borderline T abnormalities, diffuse leads No significant change since prior 11/20 Confirmed by Aletta Edouard [phone]) on 01/07/2022 6:29:21 PM  Radiology CT Head Wo Contrast  Result Date: 01/07/2022 CLINICAL DATA:  Provided history: Dizziness, persistent/recurrent, cardiac or vascular cause suspected. EXAM: CT HEAD WITHOUT CONTRAST TECHNIQUE: Contiguous axial images were obtained from the base of the skull through the vertex without intravenous contrast. RADIATION DOSE REDUCTION: This exam was performed according to the departmental dose-optimization program which includes automated exposure control, adjustment of the mA and/or kV according to patient size and/or use of iterative reconstruction technique. COMPARISON:  Prior head CT examinations 10/28/2019 and earlier. Brain MRI 03/07/2017. FINDINGS: Brain: Mild-to-moderate generalized cerebral atrophy. Comparatively mild cerebellar atrophy. Redemonstrated chronic small-vessel infarcts within the bilateral  basal ganglia and thalami. Moderate patchy and ill-defined hypoattenuation within the cerebral white matter, nonspecific but compatible with chronic small vessel ischemic disease. There is no acute intracranial hemorrhage. No demarcated cortical infarct. No extra-axial fluid collection. No evidence of an intracranial mass. No midline shift. Vascular: No hyperdense vessel.  Atherosclerotic calcifications. Skull: Normal. Negative for fracture or focal lesion. Sinuses/Orbits: Visualized orbits show no acute finding. Opacification of the left frontoethmoidal recess. Mild mucosal thickening elsewhere within the left frontal sinus. Mild mucosal thickening within the right frontoethmoidal recess. Moderate mucosal thickening within the bilateral ethmoid sinuses. Near complete opacification of the right sphenoid sinus due to the presence of mucosal thickening and fluid. Near complete opacification of the left maxillary sinus due to the presence of mucosal thickening and fluid. Associated left maxillary sinus chronic reactive  osteitis. Mild mucosal thickening within the right maxillary sinus. IMPRESSION: No evidence of acute intracranial abnormality. Redemonstrated chronic small-vessel infarcts within the bilateral basal ganglia and thalami. Moderate chronic small vessel ischemic changes within the cerebral white matter. Mild-to-moderate generalized cerebral atrophy. Comparatively mild cerebellar atrophy. Significant paranasal sinus disease, as described. Correlate for acute on chronic sinusitis. Electronically Signed   By: Kellie Simmering D.O.   On: 01/07/2022 16:57   DG Chest Port 1 View  Result Date: 01/07/2022 CLINICAL DATA:  Cough and dizziness. EXAM: PORTABLE CHEST 1 VIEW COMPARISON:  Chest x-ray dated May 18, 2021. FINDINGS: Stable cardiomediastinal silhouette status post CABG. Chronically coarsened interstitial markings are unchanged. No focal consolidation, pleural effusion, or pneumothorax. No acute osseous  abnormality. IMPRESSION: 1. No active disease. Electronically Signed   By: Titus Dubin M.D.   On: 01/07/2022 15:34    Procedures Procedures    Medications Ordered in ED Medications  sodium chloride 0.9 % bolus 500 mL (has no administration in time range)    ED Course/ Medical Decision Making/ A&P Clinical Course as of 01/08/22 0848  Mon Jan 07, 2022  1531 Chest x-ray interpreted by me as cardiomegaly no gross infiltrates.  Awaiting radiology reading. [MB]  [phone] Patient's son is here now.  He says he looks at his baseline.  He said he has had spells where he looks bad and gets checked out and they do not find anything. [MB]    Clinical Course User Index [MB] Hayden Rasmussen, MD                           Medical Decision Making Amount and/or Complexity of Data Reviewed Labs: ordered. Radiology: ordered.  Tyler Mejia was evaluated in Emergency Department on 01/07/2022 for the symptoms described in the history of present illness. He was evaluated in the context of the global COVID-19 pandemic, which necessitated consideration that the patient might be at risk for infection with the SARS-CoV-2 virus that causes COVID-19. Institutional protocols and algorithms that pertain to the evaluation of patients at risk for COVID-19 are in a state of rapid change based on information released by regulatory bodies including the CDC and federal and state organizations. These policies and algorithms were followed during the patient's care in the ED.  This patient complains of possible dizziness; this involves an extensive number of treatment Options and is a complaint that carries with it a high risk of complications and Morbidity. The differential includes stroke, bleed, dehydration, infection, flu, COVID, pneumonia, UTI  I ordered, reviewed and interpreted labs, which included CBC with normal white count normal hemoglobin, chemistries normal, LFTs normal, COVID and flu negative, troponins  flat I ordered medication IV fluids I ordered imaging studies which included chest x-ray and head CT and I independently    visualized and interpreted imaging which showed no acute findings Additional history obtained from patient's son who states he is at baseline Previous records obtained and reviewed in epic No recent admissions  After the interventions stated above, I reevaluated the patient and found patient to be neurologically intact and otherwise asymptomatic.  No indications for admission at this time.  History is limited due to patient's dementia.  He has no complaints.  Will return to facility have him continue to observe.          Final Clinical Impression(s) / ED Diagnoses Final diagnoses:  Dizziness    Rx / DC Orders ED Discharge Orders  None         Hayden Rasmussen, MD 01/08/22 [phone]

## 2022-01-07 NOTE — ED Notes (Signed)
Patient transported to CT 

## 2022-01-07 NOTE — ED Notes (Signed)
Family at bedside. 

## 2022-01-07 NOTE — ED Triage Notes (Signed)
Pt arrived from [facility] with c/o dizziness today while walking.

## 2022-01-08 DIAGNOSIS — J449 Chronic obstructive pulmonary disease, unspecified: Secondary | ICD-10-CM | POA: Diagnosis not present

## 2022-01-08 DIAGNOSIS — I1310 Hypertensive heart and chronic kidney disease without heart failure, with stage 1 through stage 4 chronic kidney disease, or unspecified chronic kidney disease: Secondary | ICD-10-CM | POA: Diagnosis not present

## 2022-01-08 DIAGNOSIS — N1831 Chronic kidney disease, stage 3a: Secondary | ICD-10-CM | POA: Diagnosis not present

## 2022-01-11 DIAGNOSIS — R946 Abnormal results of thyroid function studies: Secondary | ICD-10-CM | POA: Diagnosis not present

## 2022-01-11 DIAGNOSIS — R7309 Other abnormal glucose: Secondary | ICD-10-CM | POA: Diagnosis not present

## 2022-01-11 DIAGNOSIS — E889 Metabolic disorder, unspecified: Secondary | ICD-10-CM | POA: Diagnosis not present

## 2022-01-11 DIAGNOSIS — E785 Hyperlipidemia, unspecified: Secondary | ICD-10-CM | POA: Diagnosis not present

## 2022-01-11 DIAGNOSIS — R6889 Other general symptoms and signs: Secondary | ICD-10-CM | POA: Diagnosis not present

## 2022-01-14 DIAGNOSIS — F331 Major depressive disorder, recurrent, moderate: Secondary | ICD-10-CM | POA: Diagnosis not present

## 2022-01-14 DIAGNOSIS — F4323 Adjustment disorder with mixed anxiety and depressed mood: Secondary | ICD-10-CM | POA: Diagnosis not present

## 2022-01-14 DIAGNOSIS — I69315 Cognitive social or emotional deficit following cerebral infarction: Secondary | ICD-10-CM | POA: Diagnosis not present

## 2022-01-14 DIAGNOSIS — F0153 Vascular dementia, unspecified severity, with mood disturbance: Secondary | ICD-10-CM | POA: Diagnosis not present

## 2022-01-31 DIAGNOSIS — R079 Chest pain, unspecified: Secondary | ICD-10-CM | POA: Diagnosis not present

## 2022-02-11 DIAGNOSIS — I69315 Cognitive social or emotional deficit following cerebral infarction: Secondary | ICD-10-CM | POA: Diagnosis not present

## 2022-02-11 DIAGNOSIS — F0153 Vascular dementia, unspecified severity, with mood disturbance: Secondary | ICD-10-CM | POA: Diagnosis not present

## 2022-02-11 DIAGNOSIS — F4323 Adjustment disorder with mixed anxiety and depressed mood: Secondary | ICD-10-CM | POA: Diagnosis not present

## 2022-02-11 DIAGNOSIS — F331 Major depressive disorder, recurrent, moderate: Secondary | ICD-10-CM | POA: Diagnosis not present

## 2022-03-14 DIAGNOSIS — I69315 Cognitive social or emotional deficit following cerebral infarction: Secondary | ICD-10-CM | POA: Diagnosis not present

## 2022-03-14 DIAGNOSIS — F4323 Adjustment disorder with mixed anxiety and depressed mood: Secondary | ICD-10-CM | POA: Diagnosis not present

## 2022-03-14 DIAGNOSIS — F01B2 Vascular dementia, moderate, with psychotic disturbance: Secondary | ICD-10-CM | POA: Diagnosis not present

## 2022-03-14 DIAGNOSIS — F331 Major depressive disorder, recurrent, moderate: Secondary | ICD-10-CM | POA: Diagnosis not present

## 2022-03-20 DIAGNOSIS — J449 Chronic obstructive pulmonary disease, unspecified: Secondary | ICD-10-CM | POA: Diagnosis not present

## 2022-03-20 DIAGNOSIS — E109 Type 1 diabetes mellitus without complications: Secondary | ICD-10-CM | POA: Diagnosis not present

## 2022-03-20 DIAGNOSIS — F01B2 Vascular dementia, moderate, with psychotic disturbance: Secondary | ICD-10-CM | POA: Diagnosis not present

## 2022-03-20 DIAGNOSIS — N401 Enlarged prostate with lower urinary tract symptoms: Secondary | ICD-10-CM | POA: Diagnosis not present

## 2022-04-02 DIAGNOSIS — I2510 Atherosclerotic heart disease of native coronary artery without angina pectoris: Secondary | ICD-10-CM | POA: Diagnosis not present

## 2022-04-02 DIAGNOSIS — R32 Unspecified urinary incontinence: Secondary | ICD-10-CM | POA: Diagnosis not present

## 2022-04-02 DIAGNOSIS — R7303 Prediabetes: Secondary | ICD-10-CM | POA: Diagnosis not present

## 2022-04-02 DIAGNOSIS — H9190 Unspecified hearing loss, unspecified ear: Secondary | ICD-10-CM | POA: Diagnosis not present

## 2022-04-02 DIAGNOSIS — Z87891 Personal history of nicotine dependence: Secondary | ICD-10-CM | POA: Diagnosis not present

## 2022-04-02 DIAGNOSIS — Z9181 History of falling: Secondary | ICD-10-CM | POA: Diagnosis not present

## 2022-04-02 DIAGNOSIS — F0153 Vascular dementia, unspecified severity, with mood disturbance: Secondary | ICD-10-CM | POA: Diagnosis not present

## 2022-04-02 DIAGNOSIS — F0152 Vascular dementia, unspecified severity, with psychotic disturbance: Secondary | ICD-10-CM | POA: Diagnosis not present

## 2022-04-02 DIAGNOSIS — F32A Depression, unspecified: Secondary | ICD-10-CM | POA: Diagnosis not present

## 2022-04-02 DIAGNOSIS — Z8673 Personal history of transient ischemic attack (TIA), and cerebral infarction without residual deficits: Secondary | ICD-10-CM | POA: Diagnosis not present

## 2022-04-02 DIAGNOSIS — K219 Gastro-esophageal reflux disease without esophagitis: Secondary | ICD-10-CM | POA: Diagnosis not present

## 2022-04-02 DIAGNOSIS — Z6831 Body mass index (BMI) 31.0-31.9, adult: Secondary | ICD-10-CM | POA: Diagnosis not present

## 2022-04-02 DIAGNOSIS — I129 Hypertensive chronic kidney disease with stage 1 through stage 4 chronic kidney disease, or unspecified chronic kidney disease: Secondary | ICD-10-CM | POA: Diagnosis not present

## 2022-04-02 DIAGNOSIS — J449 Chronic obstructive pulmonary disease, unspecified: Secondary | ICD-10-CM | POA: Diagnosis not present

## 2022-04-02 DIAGNOSIS — E663 Overweight: Secondary | ICD-10-CM | POA: Diagnosis not present

## 2022-04-02 DIAGNOSIS — N183 Chronic kidney disease, stage 3 unspecified: Secondary | ICD-10-CM | POA: Diagnosis not present

## 2022-04-04 DIAGNOSIS — I10 Essential (primary) hypertension: Secondary | ICD-10-CM | POA: Diagnosis not present

## 2022-04-04 DIAGNOSIS — J449 Chronic obstructive pulmonary disease, unspecified: Secondary | ICD-10-CM | POA: Diagnosis not present

## 2022-04-08 DIAGNOSIS — F4323 Adjustment disorder with mixed anxiety and depressed mood: Secondary | ICD-10-CM | POA: Diagnosis not present

## 2022-04-08 DIAGNOSIS — I69315 Cognitive social or emotional deficit following cerebral infarction: Secondary | ICD-10-CM | POA: Diagnosis not present

## 2022-04-08 DIAGNOSIS — F01B2 Vascular dementia, moderate, with psychotic disturbance: Secondary | ICD-10-CM | POA: Diagnosis not present

## 2022-05-02 DIAGNOSIS — J449 Chronic obstructive pulmonary disease, unspecified: Secondary | ICD-10-CM | POA: Diagnosis not present

## 2022-05-02 DIAGNOSIS — Z8673 Personal history of transient ischemic attack (TIA), and cerebral infarction without residual deficits: Secondary | ICD-10-CM | POA: Diagnosis not present

## 2022-05-02 DIAGNOSIS — Z9181 History of falling: Secondary | ICD-10-CM | POA: Diagnosis not present

## 2022-05-02 DIAGNOSIS — N1831 Chronic kidney disease, stage 3a: Secondary | ICD-10-CM | POA: Diagnosis not present

## 2022-05-02 DIAGNOSIS — R32 Unspecified urinary incontinence: Secondary | ICD-10-CM | POA: Diagnosis not present

## 2022-05-02 DIAGNOSIS — I2510 Atherosclerotic heart disease of native coronary artery without angina pectoris: Secondary | ICD-10-CM | POA: Diagnosis not present

## 2022-05-02 DIAGNOSIS — E663 Overweight: Secondary | ICD-10-CM | POA: Diagnosis not present

## 2022-05-02 DIAGNOSIS — I129 Hypertensive chronic kidney disease with stage 1 through stage 4 chronic kidney disease, or unspecified chronic kidney disease: Secondary | ICD-10-CM | POA: Diagnosis not present

## 2022-05-02 DIAGNOSIS — K219 Gastro-esophageal reflux disease without esophagitis: Secondary | ICD-10-CM | POA: Diagnosis not present

## 2022-05-02 DIAGNOSIS — F01B2 Vascular dementia, moderate, with psychotic disturbance: Secondary | ICD-10-CM | POA: Diagnosis not present

## 2022-05-02 DIAGNOSIS — F32A Depression, unspecified: Secondary | ICD-10-CM | POA: Diagnosis not present

## 2022-05-02 DIAGNOSIS — Z6831 Body mass index (BMI) 31.0-31.9, adult: Secondary | ICD-10-CM | POA: Diagnosis not present

## 2022-05-02 DIAGNOSIS — F0152 Vascular dementia, unspecified severity, with psychotic disturbance: Secondary | ICD-10-CM | POA: Diagnosis not present

## 2022-05-02 DIAGNOSIS — H9190 Unspecified hearing loss, unspecified ear: Secondary | ICD-10-CM | POA: Diagnosis not present

## 2022-05-02 DIAGNOSIS — Z87891 Personal history of nicotine dependence: Secondary | ICD-10-CM | POA: Diagnosis not present

## 2022-05-02 DIAGNOSIS — F0153 Vascular dementia, unspecified severity, with mood disturbance: Secondary | ICD-10-CM | POA: Diagnosis not present

## 2022-05-02 DIAGNOSIS — R7303 Prediabetes: Secondary | ICD-10-CM | POA: Diagnosis not present

## 2022-05-02 DIAGNOSIS — N183 Chronic kidney disease, stage 3 unspecified: Secondary | ICD-10-CM | POA: Diagnosis not present

## 2022-05-02 DIAGNOSIS — I1310 Hypertensive heart and chronic kidney disease without heart failure, with stage 1 through stage 4 chronic kidney disease, or unspecified chronic kidney disease: Secondary | ICD-10-CM | POA: Diagnosis not present

## 2022-05-03 DIAGNOSIS — J449 Chronic obstructive pulmonary disease, unspecified: Secondary | ICD-10-CM | POA: Diagnosis not present

## 2022-05-03 DIAGNOSIS — I10 Essential (primary) hypertension: Secondary | ICD-10-CM | POA: Diagnosis not present

## 2022-05-06 DIAGNOSIS — F01B2 Vascular dementia, moderate, with psychotic disturbance: Secondary | ICD-10-CM | POA: Diagnosis not present

## 2022-05-06 DIAGNOSIS — I69315 Cognitive social or emotional deficit following cerebral infarction: Secondary | ICD-10-CM | POA: Diagnosis not present

## 2022-05-06 DIAGNOSIS — F4323 Adjustment disorder with mixed anxiety and depressed mood: Secondary | ICD-10-CM | POA: Diagnosis not present

## 2022-05-08 DIAGNOSIS — I129 Hypertensive chronic kidney disease with stage 1 through stage 4 chronic kidney disease, or unspecified chronic kidney disease: Secondary | ICD-10-CM | POA: Diagnosis not present

## 2022-05-08 DIAGNOSIS — F0153 Vascular dementia, unspecified severity, with mood disturbance: Secondary | ICD-10-CM | POA: Diagnosis not present

## 2022-05-08 DIAGNOSIS — N183 Chronic kidney disease, stage 3 unspecified: Secondary | ICD-10-CM | POA: Diagnosis not present

## 2022-05-21 DIAGNOSIS — J441 Chronic obstructive pulmonary disease with (acute) exacerbation: Secondary | ICD-10-CM | POA: Diagnosis not present

## 2022-05-21 DIAGNOSIS — F0153 Vascular dementia, unspecified severity, with mood disturbance: Secondary | ICD-10-CM | POA: Diagnosis not present

## 2022-05-21 DIAGNOSIS — I129 Hypertensive chronic kidney disease with stage 1 through stage 4 chronic kidney disease, or unspecified chronic kidney disease: Secondary | ICD-10-CM | POA: Diagnosis not present

## 2022-05-21 DIAGNOSIS — N1831 Chronic kidney disease, stage 3a: Secondary | ICD-10-CM | POA: Diagnosis not present

## 2022-06-03 DIAGNOSIS — I69315 Cognitive social or emotional deficit following cerebral infarction: Secondary | ICD-10-CM | POA: Diagnosis not present

## 2022-06-03 DIAGNOSIS — F01B2 Vascular dementia, moderate, with psychotic disturbance: Secondary | ICD-10-CM | POA: Diagnosis not present

## 2022-06-03 DIAGNOSIS — F4323 Adjustment disorder with mixed anxiety and depressed mood: Secondary | ICD-10-CM | POA: Diagnosis not present

## 2022-06-04 DIAGNOSIS — J449 Chronic obstructive pulmonary disease, unspecified: Secondary | ICD-10-CM | POA: Diagnosis not present

## 2022-06-04 DIAGNOSIS — G301 Alzheimer's disease with late onset: Secondary | ICD-10-CM | POA: Diagnosis not present

## 2022-06-12 DIAGNOSIS — E119 Type 2 diabetes mellitus without complications: Secondary | ICD-10-CM | POA: Diagnosis not present

## 2022-06-12 DIAGNOSIS — N401 Enlarged prostate with lower urinary tract symptoms: Secondary | ICD-10-CM | POA: Diagnosis not present

## 2022-06-12 DIAGNOSIS — G301 Alzheimer's disease with late onset: Secondary | ICD-10-CM | POA: Diagnosis not present

## 2022-06-12 DIAGNOSIS — F02818 Dementia in other diseases classified elsewhere, unspecified severity, with other behavioral disturbance: Secondary | ICD-10-CM | POA: Diagnosis not present

## 2022-06-25 DIAGNOSIS — N1831 Chronic kidney disease, stage 3a: Secondary | ICD-10-CM | POA: Diagnosis not present

## 2022-06-25 DIAGNOSIS — I129 Hypertensive chronic kidney disease with stage 1 through stage 4 chronic kidney disease, or unspecified chronic kidney disease: Secondary | ICD-10-CM | POA: Diagnosis not present

## 2022-06-25 DIAGNOSIS — F0153 Vascular dementia, unspecified severity, with mood disturbance: Secondary | ICD-10-CM | POA: Diagnosis not present

## 2022-06-25 DIAGNOSIS — J441 Chronic obstructive pulmonary disease with (acute) exacerbation: Secondary | ICD-10-CM | POA: Diagnosis not present

## 2022-06-25 DIAGNOSIS — R059 Cough, unspecified: Secondary | ICD-10-CM | POA: Diagnosis not present

## 2022-06-27 DIAGNOSIS — J449 Chronic obstructive pulmonary disease, unspecified: Secondary | ICD-10-CM | POA: Diagnosis not present

## 2022-06-27 DIAGNOSIS — G301 Alzheimer's disease with late onset: Secondary | ICD-10-CM | POA: Diagnosis not present

## 2022-06-28 DIAGNOSIS — E785 Hyperlipidemia, unspecified: Secondary | ICD-10-CM | POA: Diagnosis not present

## 2022-06-28 DIAGNOSIS — I10 Essential (primary) hypertension: Secondary | ICD-10-CM | POA: Diagnosis not present

## 2022-06-28 DIAGNOSIS — F339 Major depressive disorder, recurrent, unspecified: Secondary | ICD-10-CM | POA: Diagnosis not present

## 2022-06-28 DIAGNOSIS — E119 Type 2 diabetes mellitus without complications: Secondary | ICD-10-CM | POA: Diagnosis not present

## 2022-06-28 DIAGNOSIS — F0394 Unspecified dementia, unspecified severity, with anxiety: Secondary | ICD-10-CM | POA: Diagnosis not present

## 2022-06-28 DIAGNOSIS — Z79899 Other long term (current) drug therapy: Secondary | ICD-10-CM | POA: Diagnosis not present

## 2022-06-28 DIAGNOSIS — F0154 Vascular dementia, unspecified severity, with anxiety: Secondary | ICD-10-CM | POA: Diagnosis not present

## 2022-07-23 DIAGNOSIS — R7303 Prediabetes: Secondary | ICD-10-CM | POA: Diagnosis not present

## 2022-07-23 DIAGNOSIS — R059 Cough, unspecified: Secondary | ICD-10-CM | POA: Diagnosis not present

## 2022-07-23 DIAGNOSIS — J441 Chronic obstructive pulmonary disease with (acute) exacerbation: Secondary | ICD-10-CM | POA: Diagnosis not present

## 2022-07-23 DIAGNOSIS — N1831 Chronic kidney disease, stage 3a: Secondary | ICD-10-CM | POA: Diagnosis not present

## 2022-07-23 DIAGNOSIS — F0153 Vascular dementia, unspecified severity, with mood disturbance: Secondary | ICD-10-CM | POA: Diagnosis not present

## 2022-07-23 DIAGNOSIS — R0602 Shortness of breath: Secondary | ICD-10-CM | POA: Diagnosis not present

## 2022-07-29 DIAGNOSIS — F02818 Dementia in other diseases classified elsewhere, unspecified severity, with other behavioral disturbance: Secondary | ICD-10-CM | POA: Diagnosis not present

## 2022-07-29 DIAGNOSIS — G301 Alzheimer's disease with late onset: Secondary | ICD-10-CM | POA: Diagnosis not present

## 2022-07-29 DIAGNOSIS — I69315 Cognitive social or emotional deficit following cerebral infarction: Secondary | ICD-10-CM | POA: Diagnosis not present

## 2022-07-29 DIAGNOSIS — F331 Major depressive disorder, recurrent, moderate: Secondary | ICD-10-CM | POA: Diagnosis not present

## 2022-08-07 DIAGNOSIS — J449 Chronic obstructive pulmonary disease, unspecified: Secondary | ICD-10-CM | POA: Diagnosis not present

## 2022-08-07 DIAGNOSIS — G301 Alzheimer's disease with late onset: Secondary | ICD-10-CM | POA: Diagnosis not present

## 2022-08-09 DIAGNOSIS — Z79899 Other long term (current) drug therapy: Secondary | ICD-10-CM | POA: Diagnosis not present

## 2022-08-20 DIAGNOSIS — J441 Chronic obstructive pulmonary disease with (acute) exacerbation: Secondary | ICD-10-CM | POA: Diagnosis not present

## 2022-08-20 DIAGNOSIS — F01B2 Vascular dementia, moderate, with psychotic disturbance: Secondary | ICD-10-CM | POA: Diagnosis not present

## 2022-08-20 DIAGNOSIS — N1831 Chronic kidney disease, stage 3a: Secondary | ICD-10-CM | POA: Diagnosis not present

## 2022-08-20 DIAGNOSIS — R7303 Prediabetes: Secondary | ICD-10-CM | POA: Diagnosis not present

## 2022-08-26 DIAGNOSIS — G301 Alzheimer's disease with late onset: Secondary | ICD-10-CM | POA: Diagnosis not present

## 2022-08-26 DIAGNOSIS — F02C18 Dementia in other diseases classified elsewhere, severe, with other behavioral disturbance: Secondary | ICD-10-CM | POA: Diagnosis not present

## 2022-08-26 DIAGNOSIS — F331 Major depressive disorder, recurrent, moderate: Secondary | ICD-10-CM | POA: Diagnosis not present

## 2022-08-26 DIAGNOSIS — I69315 Cognitive social or emotional deficit following cerebral infarction: Secondary | ICD-10-CM | POA: Diagnosis not present

## 2022-09-03 DIAGNOSIS — G301 Alzheimer's disease with late onset: Secondary | ICD-10-CM | POA: Diagnosis not present

## 2022-09-03 DIAGNOSIS — I10 Essential (primary) hypertension: Secondary | ICD-10-CM | POA: Diagnosis not present

## 2022-09-04 DIAGNOSIS — F0390 Unspecified dementia without behavioral disturbance: Secondary | ICD-10-CM | POA: Diagnosis not present

## 2022-09-04 DIAGNOSIS — E119 Type 2 diabetes mellitus without complications: Secondary | ICD-10-CM | POA: Diagnosis not present

## 2022-09-04 DIAGNOSIS — N401 Enlarged prostate with lower urinary tract symptoms: Secondary | ICD-10-CM | POA: Diagnosis not present

## 2022-09-04 DIAGNOSIS — G212 Secondary parkinsonism due to other external agents: Secondary | ICD-10-CM | POA: Diagnosis not present

## 2022-09-17 DIAGNOSIS — N1831 Chronic kidney disease, stage 3a: Secondary | ICD-10-CM | POA: Diagnosis not present

## 2022-09-17 DIAGNOSIS — R7303 Prediabetes: Secondary | ICD-10-CM | POA: Diagnosis not present

## 2022-09-17 DIAGNOSIS — K219 Gastro-esophageal reflux disease without esophagitis: Secondary | ICD-10-CM | POA: Diagnosis not present

## 2022-09-17 DIAGNOSIS — J441 Chronic obstructive pulmonary disease with (acute) exacerbation: Secondary | ICD-10-CM | POA: Diagnosis not present

## 2022-09-23 DIAGNOSIS — F02C18 Dementia in other diseases classified elsewhere, severe, with other behavioral disturbance: Secondary | ICD-10-CM | POA: Diagnosis not present

## 2022-09-23 DIAGNOSIS — G301 Alzheimer's disease with late onset: Secondary | ICD-10-CM | POA: Diagnosis not present

## 2022-09-23 DIAGNOSIS — F331 Major depressive disorder, recurrent, moderate: Secondary | ICD-10-CM | POA: Diagnosis not present

## 2022-09-23 DIAGNOSIS — I69315 Cognitive social or emotional deficit following cerebral infarction: Secondary | ICD-10-CM | POA: Diagnosis not present

## 2022-09-28 DIAGNOSIS — R0989 Other specified symptoms and signs involving the circulatory and respiratory systems: Secondary | ICD-10-CM | POA: Diagnosis not present

## 2022-10-03 DIAGNOSIS — R296 Repeated falls: Secondary | ICD-10-CM | POA: Diagnosis not present

## 2022-10-12 ENCOUNTER — Encounter (HOSPITAL_COMMUNITY): Payer: Self-pay | Admitting: Emergency Medicine

## 2022-10-12 ENCOUNTER — Inpatient Hospital Stay (HOSPITAL_COMMUNITY)
Admission: EM | Admit: 2022-10-12 | Discharge: 2022-10-21 | DRG: 871 | Disposition: A | Discharge status: Hospice | Payer: Medicare Other | Source: Skilled Nursing Facility | Attending: Family Medicine | Admitting: Family Medicine

## 2022-10-12 ENCOUNTER — Emergency Department (HOSPITAL_COMMUNITY): Payer: Medicare Other

## 2022-10-12 ENCOUNTER — Other Ambulatory Visit: Payer: Self-pay

## 2022-10-12 DIAGNOSIS — U071 COVID-19: Secondary | ICD-10-CM | POA: Diagnosis present

## 2022-10-12 DIAGNOSIS — I4891 Unspecified atrial fibrillation: Secondary | ICD-10-CM | POA: Diagnosis present

## 2022-10-12 DIAGNOSIS — F0280 Dementia in other diseases classified elsewhere without behavioral disturbance: Secondary | ICD-10-CM | POA: Diagnosis not present

## 2022-10-12 DIAGNOSIS — A4189 Other specified sepsis: Principal | ICD-10-CM | POA: Diagnosis present

## 2022-10-12 DIAGNOSIS — N400 Enlarged prostate without lower urinary tract symptoms: Secondary | ICD-10-CM | POA: Diagnosis present

## 2022-10-12 DIAGNOSIS — Z8673 Personal history of transient ischemic attack (TIA), and cerebral infarction without residual deficits: Secondary | ICD-10-CM

## 2022-10-12 DIAGNOSIS — J449 Chronic obstructive pulmonary disease, unspecified: Secondary | ICD-10-CM | POA: Diagnosis present

## 2022-10-12 DIAGNOSIS — F32A Depression, unspecified: Secondary | ICD-10-CM | POA: Diagnosis present

## 2022-10-12 DIAGNOSIS — G309 Alzheimer's disease, unspecified: Secondary | ICD-10-CM | POA: Diagnosis present

## 2022-10-12 DIAGNOSIS — Z833 Family history of diabetes mellitus: Secondary | ICD-10-CM

## 2022-10-12 DIAGNOSIS — R1310 Dysphagia, unspecified: Secondary | ICD-10-CM | POA: Diagnosis not present

## 2022-10-12 DIAGNOSIS — E785 Hyperlipidemia, unspecified: Secondary | ICD-10-CM | POA: Diagnosis present

## 2022-10-12 DIAGNOSIS — F0150 Vascular dementia without behavioral disturbance: Secondary | ICD-10-CM | POA: Diagnosis present

## 2022-10-12 DIAGNOSIS — I69391 Dysphagia following cerebral infarction: Secondary | ICD-10-CM | POA: Diagnosis not present

## 2022-10-12 DIAGNOSIS — Z66 Do not resuscitate: Secondary | ICD-10-CM | POA: Diagnosis not present

## 2022-10-12 DIAGNOSIS — N179 Acute kidney failure, unspecified: Secondary | ICD-10-CM | POA: Diagnosis present

## 2022-10-12 DIAGNOSIS — J1282 Pneumonia due to coronavirus disease 2019: Secondary | ICD-10-CM | POA: Diagnosis present

## 2022-10-12 DIAGNOSIS — Z800 Family history of malignant neoplasm of digestive organs: Secondary | ICD-10-CM

## 2022-10-12 DIAGNOSIS — E872 Acidosis, unspecified: Secondary | ICD-10-CM | POA: Diagnosis present

## 2022-10-12 DIAGNOSIS — J690 Pneumonitis due to inhalation of food and vomit: Secondary | ICD-10-CM | POA: Diagnosis present

## 2022-10-12 DIAGNOSIS — R6521 Severe sepsis with septic shock: Secondary | ICD-10-CM | POA: Diagnosis not present

## 2022-10-12 DIAGNOSIS — I252 Old myocardial infarction: Secondary | ICD-10-CM

## 2022-10-12 DIAGNOSIS — J9601 Acute respiratory failure with hypoxia: Principal | ICD-10-CM

## 2022-10-12 DIAGNOSIS — E860 Dehydration: Secondary | ICD-10-CM | POA: Diagnosis present

## 2022-10-12 DIAGNOSIS — R54 Age-related physical debility: Secondary | ICD-10-CM | POA: Diagnosis present

## 2022-10-12 DIAGNOSIS — I10 Essential (primary) hypertension: Secondary | ICD-10-CM | POA: Diagnosis not present

## 2022-10-12 DIAGNOSIS — Z803 Family history of malignant neoplasm of breast: Secondary | ICD-10-CM

## 2022-10-12 DIAGNOSIS — A419 Sepsis, unspecified organism: Secondary | ICD-10-CM | POA: Diagnosis present

## 2022-10-12 DIAGNOSIS — E1165 Type 2 diabetes mellitus with hyperglycemia: Secondary | ICD-10-CM | POA: Diagnosis present

## 2022-10-12 DIAGNOSIS — J9811 Atelectasis: Secondary | ICD-10-CM | POA: Diagnosis not present

## 2022-10-12 DIAGNOSIS — Z8249 Family history of ischemic heart disease and other diseases of the circulatory system: Secondary | ICD-10-CM

## 2022-10-12 DIAGNOSIS — R739 Hyperglycemia, unspecified: Secondary | ICD-10-CM | POA: Diagnosis not present

## 2022-10-12 DIAGNOSIS — I2510 Atherosclerotic heart disease of native coronary artery without angina pectoris: Secondary | ICD-10-CM | POA: Diagnosis present

## 2022-10-12 DIAGNOSIS — Z7189 Other specified counseling: Secondary | ICD-10-CM | POA: Diagnosis not present

## 2022-10-12 DIAGNOSIS — Z87891 Personal history of nicotine dependence: Secondary | ICD-10-CM

## 2022-10-12 DIAGNOSIS — N3001 Acute cystitis with hematuria: Secondary | ICD-10-CM | POA: Diagnosis present

## 2022-10-12 DIAGNOSIS — K219 Gastro-esophageal reflux disease without esophagitis: Secondary | ICD-10-CM | POA: Diagnosis present

## 2022-10-12 DIAGNOSIS — E669 Obesity, unspecified: Secondary | ICD-10-CM | POA: Diagnosis present

## 2022-10-12 DIAGNOSIS — Z515 Encounter for palliative care: Secondary | ICD-10-CM

## 2022-10-12 DIAGNOSIS — Z823 Family history of stroke: Secondary | ICD-10-CM

## 2022-10-12 DIAGNOSIS — R0902 Hypoxemia: Secondary | ICD-10-CM | POA: Diagnosis present

## 2022-10-12 DIAGNOSIS — Z951 Presence of aortocoronary bypass graft: Secondary | ICD-10-CM

## 2022-10-12 LAB — COMPREHENSIVE METABOLIC PANEL
ALT: 15 U/L (ref 0–44)
AST: 20 U/L (ref 15–41)
Albumin: 3.8 g/dL (ref 3.5–5.0)
Alkaline Phosphatase: 74 U/L (ref 38–126)
Anion gap: 8 (ref 5–15)
BUN: 28 mg/dL — ABNORMAL HIGH (ref 8–23)
CO2: 27 mmol/L (ref 22–32)
Calcium: 8.7 mg/dL — ABNORMAL LOW (ref 8.9–10.3)
Chloride: 100 mmol/L (ref 98–111)
Creatinine, Ser: 1.29 mg/dL — ABNORMAL HIGH (ref 0.61–1.24)
GFR, Estimated: 55 mL/min — ABNORMAL LOW (ref >60)
Glucose, Bld: 179 mg/dL — ABNORMAL HIGH (ref 70–99)
Potassium: 4.7 mmol/L (ref 3.5–5.1)
Sodium: 135 mmol/L (ref 135–145)
Total Bilirubin: 1.3 mg/dL — ABNORMAL HIGH (ref 0.3–1.2)
Total Protein: 7.5 g/dL (ref 6.5–8.1)

## 2022-10-12 LAB — LACTIC ACID, PLASMA: Lactic Acid, Venous: 2.4 mmol/L (ref 0.5–1.9)

## 2022-10-12 LAB — CBC WITH DIFFERENTIAL/PLATELET
Abs Immature Granulocytes: 0.13 10*3/uL — ABNORMAL HIGH (ref 0.00–0.07)
Basophils Absolute: 0.1 10*3/uL (ref 0.0–0.1)
Basophils Relative: 0 %
Eosinophils Absolute: 0 10*3/uL (ref 0.0–0.5)
Eosinophils Relative: 0 %
HCT: 44.9 % (ref 39.0–52.0)
Hemoglobin: 14.4 g/dL (ref 13.0–17.0)
Immature Granulocytes: 1 %
Lymphocytes Relative: 7 %
Lymphs Abs: 0.8 10*3/uL (ref 0.7–4.0)
MCH: 29.1 pg (ref 26.0–34.0)
MCHC: 32.1 g/dL (ref 30.0–36.0)
MCV: 90.7 fL (ref 80.0–100.0)
Monocytes Absolute: 1 10*3/uL (ref 0.1–1.0)
Monocytes Relative: 8 %
Neutro Abs: 10.6 10*3/uL — ABNORMAL HIGH (ref 1.7–7.7)
Neutrophils Relative %: 84 %
Platelets: 171 10*3/uL (ref 150–400)
RBC: 4.95 MIL/uL (ref 4.22–5.81)
RDW: 12.7 % (ref 11.5–15.5)
WBC: 12.7 10*3/uL — ABNORMAL HIGH (ref 4.0–10.5)
nRBC: 0 % (ref 0.0–0.2)

## 2022-10-12 LAB — PROTIME-INR
INR: 1 (ref 0.8–1.2)
Prothrombin Time: 13.3 seconds (ref 11.4–15.2)

## 2022-10-12 LAB — APTT: aPTT: 30 seconds (ref 24–36)

## 2022-10-12 MED ORDER — SODIUM CHLORIDE 0.9 % IV BOLUS
2000.0000 mL | Freq: Once | INTRAVENOUS | Status: AC
  Administered 2022-10-12: 2000 mL via INTRAVENOUS

## 2022-10-12 MED ORDER — ACETAMINOPHEN 650 MG RE SUPP
650.0000 mg | Freq: Once | RECTAL | Status: AC
  Administered 2022-10-13: 650 mg via RECTAL
  Filled 2022-10-12: qty 1

## 2022-10-12 MED ORDER — SODIUM CHLORIDE 0.9 % IV SOLN
3.0000 g | Freq: Once | INTRAVENOUS | Status: AC
  Administered 2022-10-12: 3 g via INTRAVENOUS
  Filled 2022-10-12: qty 8

## 2022-10-12 MED ORDER — SODIUM CHLORIDE 0.9 % IV BOLUS
1000.0000 mL | Freq: Once | INTRAVENOUS | Status: AC
  Administered 2022-10-12: 1000 mL via INTRAVENOUS

## 2022-10-12 NOTE — ED Provider Notes (Signed)
[facility] EMERGENCY DEPARTMENT Provider Note   CSN: [id number] Arrival date & time: 10/12/22  2149     History {Add pertinent medical, surgical, social history, OB history to HPI:1} Chief Complaint  Patient presents with   Respiratory Distress    Tyler Mejia is a 86 y.o. male.  Patient has a history of dementia COPD status stroke with difficulty swallowing and recently he is positive COVID.  He stays in a nursing home and became short of breath.  It is most likely that he has aspirated.  He was very hypoxic initially when picked up by paramedics.  The history is provided by a relative and the EMS personnel. No language interpreter was used.  Shortness of Breath Severity:  Moderate Onset quality:  Sudden Timing:  Constant Progression:  Worsening Chronicity:  Recurrent Context: not fumes   Relieved by:  Nothing Worsened by:  Nothing Ineffective treatments:  None tried Associated symptoms: no abdominal pain   Risk factors: no recent alcohol use        Home Medications Prior to Admission medications   Medication Sig Start Date End Date Taking? Authorizing Provider  acetaminophen (TYLENOL) 325 MG tablet Take 2 tablets (650 mg total) by mouth every 6 (six) hours as needed for mild pain, fever or headache. Patient taking differently: Take 325 mg by mouth every 6 (six) hours as needed for mild pain or fever. 08/24/15   Barrett, Erin R, PA-C  albuterol (PROVENTIL HFA;VENTOLIN HFA) 108 (90 Base) MCG/ACT inhaler Inhale 2 puffs into the lungs every 6 (six) hours as needed for wheezing or shortness of breath. Patient taking differently: Inhale 2 puffs into the lungs 3 (three) times daily. 09/08/17   Orson Eva, MD  nystatin cream (MYCOSTATIN) Apply 1 application topically 2 (two) times daily. 10 day course for groin redness/irritation    [provider]  risperiDONE (RISPERDAL) 0.5 MG tablet Take 0.5 mg by mouth 2 (two) times daily.    [provider]  sertraline  (ZOLOFT) 25 MG tablet Take 50 mg by mouth every morning.    [provider]  tamsulosin (FLOMAX) 0.4 MG CAPS capsule Take 0.4 mg by mouth daily. 04/12/16   [provider]  Zinc Oxide 12 % CREA Apply 1 application topically 3 (three) times daily as needed (redness).    [provider]      Allergies    Codeine and Statins    Review of Systems   Review of Systems  Unable to perform ROS: Severe respiratory distress  Respiratory:  Positive for shortness of breath.   Gastrointestinal:  Negative for abdominal pain.    Physical Exam Updated Vital Signs BP (!) 102/40 (BP Location: Left Arm)   Pulse (!) 107   Temp (!) 101.2 F (38.4 C) (Rectal)   Resp (!) 6   Ht '6\' 1"'$  (1.854 m)   Wt 107 kg   SpO2 90% Comment: On 100% NRB  BMI 31.12 kg/m  Physical Exam Vitals and nursing note reviewed.  Constitutional:      Appearance: He is well-developed. He is ill-appearing.     Comments: Patient awake but not answering questions  HENT:     Head: Normocephalic.     Nose: Nose normal.     Mouth/Throat:     Mouth: Mucous membranes are moist.  Eyes:     General: No scleral icterus.    Conjunctiva/sclera: Conjunctivae normal.  Neck:     Thyroid: No thyromegaly.  Cardiovascular:  Rate and Rhythm: Normal rate and regular rhythm.     Heart sounds: No murmur heard.    No friction rub. No gallop.  Pulmonary:     Breath sounds: No stridor. Rales present. No wheezing.     Comments: Patient has significant rales in his right lungs.  He was suctioned by respiratory and that helped Chest:     Chest wall: No tenderness.  Abdominal:     General: There is no distension.     Tenderness: There is no abdominal tenderness. There is no rebound.  Musculoskeletal:        General: Normal range of motion.     Cervical back: Neck supple.  Lymphadenopathy:     Cervical: No cervical adenopathy.  Skin:    Findings: No erythema or rash.  Neurological:     Mental Status: He is  oriented to person, place, and time.     Motor: No abnormal muscle tone.     Coordination: Coordination normal.  Psychiatric:        Behavior: Behavior normal.     ED Results / Procedures / Treatments   Labs (all labs ordered are listed, but only abnormal results are displayed) Labs Reviewed - No data to display  EKG None  Radiology No results found.  Procedures Procedures  {Document cardiac monitor, telemetry assessment procedure when appropriate:1}  Medications Ordered in ED Medications - No data to display  ED Course/ Medical Decision Making/ A&P  CRITICAL CARE Performed by: Milton Ferguson Total critical care time: 45 minutes Critical care time was exclusive of separately billable procedures and treating other patients. Critical care was necessary to treat or prevent imminent or life-threatening deterioration. Critical care was time spent personally by me on the following activities: development of treatment plan with patient and/or surrogate as well as nursing, discussions with consultants, evaluation of patient's response to treatment, examination of patient, obtaining history from patient or surrogate, ordering and performing treatments and interventions, ordering and review of laboratory studies, ordering and review of radiographic studies, pulse oximetry and re-evaluation of patient's condition.    Patient with moderate to severe dementia COPD coronary artery disease stroke with difficulty swallowing, recent positive COVID infection.  Also he is a DNR and DNI.  I spoke to his daughter and she confirmed that.  Sepsis protocol was started.  Patient was started on Unasyn.                         Medical Decision Making Amount and/or Complexity of Data Reviewed Labs: ordered. Radiology: ordered. ECG/medicine tests: ordered.  Risk OTC drugs. Decision regarding hospitalization.   Patient with aspiration pneumonia.  And respiratory distress.  He is placed on Unasyn  and will be admitted to medicine  {Document critical care time when appropriate:1} {Document review of labs and clinical decision tools ie heart score, Chads2Vasc2 etc:1}  {Document your independent review of radiology images, and any outside records:1} {Document your discussion with family members, caretakers, and with consultants:1} {Document social determinants of health affecting pt's care:1} {Document your decision making why or why not admission, treatments were needed:1} Final Clinical Impression(s) / ED Diagnoses Final diagnoses:  Dehydration  Acute cystitis with hematuria    Rx / DC Orders ED Discharge Orders     None

## 2022-10-12 NOTE — ED Notes (Signed)
Date and time results received: 10/12/22 2233   Test: LACTIC Critical Value: 2.4  Name of Provider Notified: Roderic Palau, MD

## 2022-10-12 NOTE — Progress Notes (Signed)
Pt being followed by [facility] for Sepsis protocol. 

## 2022-10-12 NOTE — ED Provider Notes (Signed)
Care assumed from Dr. Dewayne Hatch, patient from nursing home with fever and probable aspiration. He is DNR/DNI. MAP is low. He is getting IV fluids. Dr. Josephine Cables will accept the patient when his MAP is above 65.  Patient has received almost 3 L of IV fluids, and is still hypotensive.  I had discussions with the patient's daughter, Tyler Mejia as well as her other daughter, Donaciano Eva who is healthcare power of attorney.  I discussed the clinical situation as well as the options proceeding with central line and pressors.  They state that patient has been having a general decline over the last 6 months and they do not feel aggressive measures would be warranted and do not wish to proceed with central line or pressors.  I have discussed with Dr. Josephine Cables, the fact that patient is DNR and DNI and family does not want central lines or pressors and have requested that he come to admit the patient.  Initial lactic acid level was elevated at 2.4.  I have ordered additional IV fluids.  CRITICAL CARE Performed by: Tyler Mejia Total critical care time: 40 minutes Critical care time was exclusive of separately billable procedures and treating other patients. Critical care was necessary to treat or prevent imminent or life-threatening deterioration. Critical care was time spent personally by me on the following activities: development of treatment plan with patient and/or surrogate as well as nursing, discussions with consultants, evaluation of patient's response to treatment, examination of patient, obtaining history from patient or surrogate, ordering and performing treatments and interventions, ordering and review of laboratory studies, ordering and review of radiographic studies, pulse oximetry and re-evaluation of patient's condition.    Tyler Fuel, MD 23/53/61 [phone]

## 2022-10-12 NOTE — ED Notes (Signed)
Xray tech at patient bedside.

## 2022-10-12 NOTE — ED Triage Notes (Signed)
Pt brought in via [facility] after they were called out for pt in respiratory distress. EMS states that upon their arrival, pt's sats were 78% on R/A and pt was "gurgling". Pt with hx of dysphagia and staff unsure if pt possibly aspirated. EMS proceeded to provide O2 via BVM and sats up to 84%.

## 2022-10-13 ENCOUNTER — Encounter (HOSPITAL_COMMUNITY): Payer: Self-pay | Admitting: Internal Medicine

## 2022-10-13 DIAGNOSIS — F32A Depression, unspecified: Secondary | ICD-10-CM | POA: Diagnosis present

## 2022-10-13 DIAGNOSIS — E1165 Type 2 diabetes mellitus with hyperglycemia: Secondary | ICD-10-CM | POA: Diagnosis present

## 2022-10-13 DIAGNOSIS — Z8249 Family history of ischemic heart disease and other diseases of the circulatory system: Secondary | ICD-10-CM | POA: Diagnosis not present

## 2022-10-13 DIAGNOSIS — R1310 Dysphagia, unspecified: Secondary | ICD-10-CM

## 2022-10-13 DIAGNOSIS — R6521 Severe sepsis with septic shock: Secondary | ICD-10-CM

## 2022-10-13 DIAGNOSIS — E872 Acidosis, unspecified: Secondary | ICD-10-CM | POA: Diagnosis present

## 2022-10-13 DIAGNOSIS — Z8673 Personal history of transient ischemic attack (TIA), and cerebral infarction without residual deficits: Secondary | ICD-10-CM

## 2022-10-13 DIAGNOSIS — A419 Sepsis, unspecified organism: Secondary | ICD-10-CM | POA: Diagnosis not present

## 2022-10-13 DIAGNOSIS — J1282 Pneumonia due to coronavirus disease 2019: Secondary | ICD-10-CM | POA: Diagnosis present

## 2022-10-13 DIAGNOSIS — Z66 Do not resuscitate: Secondary | ICD-10-CM | POA: Diagnosis present

## 2022-10-13 DIAGNOSIS — N179 Acute kidney failure, unspecified: Secondary | ICD-10-CM

## 2022-10-13 DIAGNOSIS — N3001 Acute cystitis with hematuria: Secondary | ICD-10-CM | POA: Diagnosis present

## 2022-10-13 DIAGNOSIS — Z7189 Other specified counseling: Secondary | ICD-10-CM | POA: Diagnosis not present

## 2022-10-13 DIAGNOSIS — U071 COVID-19: Secondary | ICD-10-CM | POA: Diagnosis present

## 2022-10-13 DIAGNOSIS — A4189 Other specified sepsis: Secondary | ICD-10-CM | POA: Diagnosis present

## 2022-10-13 DIAGNOSIS — E860 Dehydration: Secondary | ICD-10-CM | POA: Diagnosis present

## 2022-10-13 DIAGNOSIS — J690 Pneumonitis due to inhalation of food and vomit: Secondary | ICD-10-CM | POA: Diagnosis present

## 2022-10-13 DIAGNOSIS — G309 Alzheimer's disease, unspecified: Secondary | ICD-10-CM | POA: Diagnosis present

## 2022-10-13 DIAGNOSIS — I69391 Dysphagia following cerebral infarction: Secondary | ICD-10-CM | POA: Diagnosis not present

## 2022-10-13 DIAGNOSIS — F0280 Dementia in other diseases classified elsewhere without behavioral disturbance: Secondary | ICD-10-CM | POA: Diagnosis present

## 2022-10-13 DIAGNOSIS — I4891 Unspecified atrial fibrillation: Secondary | ICD-10-CM | POA: Diagnosis present

## 2022-10-13 DIAGNOSIS — I10 Essential (primary) hypertension: Secondary | ICD-10-CM | POA: Diagnosis present

## 2022-10-13 DIAGNOSIS — Z515 Encounter for palliative care: Secondary | ICD-10-CM | POA: Diagnosis not present

## 2022-10-13 DIAGNOSIS — E785 Hyperlipidemia, unspecified: Secondary | ICD-10-CM | POA: Diagnosis present

## 2022-10-13 DIAGNOSIS — I252 Old myocardial infarction: Secondary | ICD-10-CM | POA: Diagnosis not present

## 2022-10-13 DIAGNOSIS — J9601 Acute respiratory failure with hypoxia: Secondary | ICD-10-CM | POA: Diagnosis not present

## 2022-10-13 DIAGNOSIS — R739 Hyperglycemia, unspecified: Secondary | ICD-10-CM

## 2022-10-13 DIAGNOSIS — F0150 Vascular dementia without behavioral disturbance: Secondary | ICD-10-CM | POA: Diagnosis present

## 2022-10-13 DIAGNOSIS — E669 Obesity, unspecified: Secondary | ICD-10-CM | POA: Diagnosis present

## 2022-10-13 DIAGNOSIS — J449 Chronic obstructive pulmonary disease, unspecified: Secondary | ICD-10-CM | POA: Diagnosis present

## 2022-10-13 LAB — D-DIMER, QUANTITATIVE: D-Dimer, Quant: 1.89 ug/mL-FEU — ABNORMAL HIGH (ref 0.00–0.50)

## 2022-10-13 LAB — RESP PANEL BY RT-PCR (FLU A&B, COVID) ARPGX2
Influenza A by PCR: NEGATIVE
Influenza B by PCR: NEGATIVE
SARS Coronavirus 2 by RT PCR: POSITIVE — AB

## 2022-10-13 LAB — URINALYSIS, ROUTINE W REFLEX MICROSCOPIC
Bacteria, UA: NONE SEEN
Bilirubin Urine: NEGATIVE
Glucose, UA: NEGATIVE mg/dL
Hgb urine dipstick: NEGATIVE
Ketones, ur: NEGATIVE mg/dL
Leukocytes,Ua: NEGATIVE
Nitrite: NEGATIVE
Protein, ur: 100 mg/dL — AB
Specific Gravity, Urine: 1.025 (ref 1.005–1.030)
pH: 5 (ref 5.0–8.0)

## 2022-10-13 LAB — COMPREHENSIVE METABOLIC PANEL
ALT: 16 U/L (ref 0–44)
AST: 22 U/L (ref 15–41)
Albumin: 3.7 g/dL (ref 3.5–5.0)
Alkaline Phosphatase: 67 U/L (ref 38–126)
Anion gap: 8 (ref 5–15)
BUN: 25 mg/dL — ABNORMAL HIGH (ref 8–23)
CO2: 26 mmol/L (ref 22–32)
Calcium: 8.3 mg/dL — ABNORMAL LOW (ref 8.9–10.3)
Chloride: 105 mmol/L (ref 98–111)
Creatinine, Ser: 0.98 mg/dL (ref 0.61–1.24)
GFR, Estimated: >60 mL/min (ref >60)
Glucose, Bld: 139 mg/dL — ABNORMAL HIGH (ref 70–99)
Potassium: 5 mmol/L (ref 3.5–5.1)
Sodium: 139 mmol/L (ref 135–145)
Total Bilirubin: 0.9 mg/dL (ref 0.3–1.2)
Total Protein: 7.3 g/dL (ref 6.5–8.1)

## 2022-10-13 LAB — CBC WITH DIFFERENTIAL/PLATELET
Abs Immature Granulocytes: 0.06 10*3/uL (ref 0.00–0.07)
Basophils Absolute: 0.1 10*3/uL (ref 0.0–0.1)
Basophils Relative: 0 %
Eosinophils Absolute: 0 10*3/uL (ref 0.0–0.5)
Eosinophils Relative: 0 %
HCT: 42.5 % (ref 39.0–52.0)
Hemoglobin: 13.3 g/dL (ref 13.0–17.0)
Immature Granulocytes: 0 %
Lymphocytes Relative: 4 %
Lymphs Abs: 0.8 10*3/uL (ref 0.7–4.0)
MCH: 29 pg (ref 26.0–34.0)
MCHC: 31.3 g/dL (ref 30.0–36.0)
MCV: 92.8 fL (ref 80.0–100.0)
Monocytes Absolute: 1.8 10*3/uL — ABNORMAL HIGH (ref 0.1–1.0)
Monocytes Relative: 11 %
Neutro Abs: 14.8 10*3/uL — ABNORMAL HIGH (ref 1.7–7.7)
Neutrophils Relative %: 85 %
Platelets: 142 10*3/uL — ABNORMAL LOW (ref 150–400)
RBC: 4.58 MIL/uL (ref 4.22–5.81)
RDW: 13 % (ref 11.5–15.5)
WBC: 17.5 10*3/uL — ABNORMAL HIGH (ref 4.0–10.5)
nRBC: 0 % (ref 0.0–0.2)

## 2022-10-13 LAB — FERRITIN: Ferritin: 193 ng/mL (ref 24–336)

## 2022-10-13 LAB — MRSA NEXT GEN BY PCR, NASAL: MRSA by PCR Next Gen: NOT DETECTED

## 2022-10-13 LAB — PROCALCITONIN: Procalcitonin: 0.41 ng/mL

## 2022-10-13 LAB — C-REACTIVE PROTEIN: CRP: 12.8 mg/dL — ABNORMAL HIGH (ref <1.0)

## 2022-10-13 LAB — GLUCOSE, CAPILLARY
Glucose-Capillary: 128 mg/dL — ABNORMAL HIGH (ref 70–99)
Glucose-Capillary: 118 mg/dL — ABNORMAL HIGH (ref 70–99)

## 2022-10-13 LAB — MAGNESIUM: Magnesium: 1.8 mg/dL (ref 1.7–2.4)

## 2022-10-13 LAB — PHOSPHORUS: Phosphorus: 4.6 mg/dL (ref 2.5–4.6)

## 2022-10-13 LAB — LACTIC ACID, PLASMA: Lactic Acid, Venous: 1.7 mmol/L (ref 0.5–1.9)

## 2022-10-13 MED ORDER — LORAZEPAM 2 MG/ML PO CONC: 1.0000 mg | ORAL | Status: DC | PRN

## 2022-10-13 MED ORDER — METHYLPREDNISOLONE SODIUM SUCC 125 MG IJ SOLR
0.5000 mg/kg | Freq: Two times a day (BID) | INTRAMUSCULAR | Status: DC
  Administered 2022-10-13: 57.5 mg via INTRAVENOUS
  Filled 2022-10-13: qty 2

## 2022-10-13 MED ORDER — SODIUM CHLORIDE 0.9 % IV SOLN
3.0000 g | Freq: Four times a day (QID) | INTRAVENOUS | Status: DC
  Filled 2022-10-13 (×6): qty 8

## 2022-10-13 MED ORDER — SODIUM CHLORIDE 0.9 % IV SOLN
100.0000 mg | Freq: Once | INTRAVENOUS | Status: AC
  Administered 2022-10-13: 100 mg via INTRAVENOUS
  Filled 2022-10-13: qty 20

## 2022-10-13 MED ORDER — ONDANSETRON HCL 4 MG PO TABS: 4.0000 mg | ORAL_TABLET | Freq: Four times a day (QID) | ORAL | Status: DC | PRN

## 2022-10-13 MED ORDER — SODIUM CHLORIDE 0.9 % IV SOLN
100.0000 mg | Freq: Every day | INTRAVENOUS | Status: DC
  Filled 2022-10-13: qty 20

## 2022-10-13 MED ORDER — GLYCOPYRROLATE 0.2 MG/ML IJ SOLN
0.2000 mg | INTRAMUSCULAR | Status: DC | PRN
  Filled 2022-10-13 (×6): qty 1

## 2022-10-13 MED ORDER — SODIUM CHLORIDE 0.9 % IV SOLN
3.0000 g | Freq: Four times a day (QID) | INTRAVENOUS | Status: DC
  Administered 2022-10-13: 3 g via INTRAVENOUS
  Filled 2022-10-13 (×6): qty 8

## 2022-10-13 MED ORDER — LORAZEPAM 1 MG PO TABS: 1.0000 mg | ORAL_TABLET | ORAL | Status: DC | PRN

## 2022-10-13 MED ORDER — ALBUTEROL SULFATE HFA 108 (90 BASE) MCG/ACT IN AERS
2.0000 | INHALATION_SPRAY | Freq: Four times a day (QID) | RESPIRATORY_TRACT | Status: DC | PRN
  Administered 2022-10-14: 2 via RESPIRATORY_TRACT
  Filled 2022-10-13: qty 6.7

## 2022-10-13 MED ORDER — ENOXAPARIN SODIUM 60 MG/0.6ML IJ SOSY
55.0000 mg | PREFILLED_SYRINGE | INTRAMUSCULAR | Status: DC
  Administered 2022-10-13: 55 mg via SUBCUTANEOUS
  Filled 2022-10-13: qty 0.6

## 2022-10-13 MED ORDER — SODIUM CHLORIDE 0.9 % IV BOLUS
500.0000 mL | Freq: Once | INTRAVENOUS | Status: AC
  Administered 2022-10-13: 500 mL via INTRAVENOUS

## 2022-10-13 MED ORDER — GLYCOPYRROLATE 1 MG PO TABS
1.0000 mg | ORAL_TABLET | ORAL | Status: DC | PRN
  Administered 2022-10-16 (×2): 1 mg via ORAL
  Filled 2022-10-13 (×2): qty 1

## 2022-10-13 MED ORDER — ONDANSETRON HCL 4 MG/2ML IJ SOLN: 4.0000 mg | Freq: Four times a day (QID) | INTRAMUSCULAR | Status: DC | PRN

## 2022-10-13 MED ORDER — LORAZEPAM 2 MG/ML IJ SOLN
1.0000 mg | INTRAMUSCULAR | Status: DC | PRN
  Administered 2022-10-14 – 2022-10-17 (×2): 1 mg via INTRAVENOUS
  Filled 2022-10-13 (×2): qty 1

## 2022-10-13 MED ORDER — PREDNISONE 20 MG PO TABS: 50.0000 mg | ORAL_TABLET | Freq: Every day | ORAL | Status: DC

## 2022-10-13 MED ORDER — SODIUM CHLORIDE 0.9 % IV SOLN: 200.0000 mg | Freq: Once | INTRAVENOUS | Status: DC

## 2022-10-13 MED ORDER — FENTANYL CITRATE PF 50 MCG/ML IJ SOSY
25.0000 ug | PREFILLED_SYRINGE | INTRAMUSCULAR | Status: DC | PRN
  Administered 2022-10-15 (×3): 25 ug via INTRAVENOUS
  Filled 2022-10-13 (×3): qty 1

## 2022-10-13 MED ORDER — INSULIN ASPART 100 UNIT/ML IJ SOLN
0.0000 [IU] | INTRAMUSCULAR | Status: DC
  Administered 2022-10-13: 1 [IU] via SUBCUTANEOUS

## 2022-10-13 MED ORDER — SODIUM CHLORIDE 0.9 % IV SOLN: 100.0000 mg | Freq: Every day | INTRAVENOUS | Status: DC

## 2022-10-13 MED ORDER — DM-GUAIFENESIN ER 30-600 MG PO TB12
1.0000 | ORAL_TABLET | Freq: Two times a day (BID) | ORAL | Status: DC
  Filled 2022-10-13: qty 1

## 2022-10-13 MED ORDER — GLYCOPYRROLATE 0.2 MG/ML IJ SOLN: 0.2000 mg | INTRAMUSCULAR | Status: DC | PRN

## 2022-10-13 MED ORDER — LACTATED RINGERS IV BOLUS
1000.0000 mL | Freq: Once | INTRAVENOUS | Status: AC
  Administered 2022-10-13: 1000 mL via INTRAVENOUS

## 2022-10-13 MED ORDER — SODIUM CHLORIDE 0.9 % IV SOLN: INTRAVENOUS | Status: DC

## 2022-10-13 MED ORDER — GLYCOPYRROLATE 0.2 MG/ML IJ SOLN
0.2000 mg | INTRAMUSCULAR | Status: DC | PRN
  Administered 2022-10-13 – 2022-10-19 (×6): 0.2 mg via INTRAVENOUS

## 2022-10-13 MED ORDER — SODIUM CHLORIDE 0.9 % IV SOLN
500.0000 mg | INTRAVENOUS | Status: DC
  Administered 2022-10-13: 500 mg via INTRAVENOUS
  Filled 2022-10-13: qty 5

## 2022-10-13 MED ORDER — PANTOPRAZOLE SODIUM 40 MG IV SOLR: 40.0000 mg | Freq: Every day | INTRAVENOUS | Status: DC

## 2022-10-13 NOTE — H&P (Signed)
History and Physical    Patient: Tyler Mejia HGD:[id number] DOB: 06/23/1936 DOA: 10/12/2022 DOS: the patient was seen and examined on 10/13/2022 PCP: [name], MD  Patient coming from: SNF  Chief Complaint:  Chief Complaint  Patient presents with   Respiratory Distress   HPI: Tyler Mejia is a 86 y.o. male with medical history significant of COPD, BPH, CAD s/p CABG x4, NSTEMI (2013), history of stroke (January 2017) with residual difficulty in swallowing, depression who presents to the emergency department via EMS due to shortness of breath which was thought to be due to aspiration.  Patient was somnolent, but was arousable with mild sternal rub, but quickly goes back to sleep, he was nonverbal at baseline per RN.  History was obtained from ED physician and from ED medical record.  Per report, patient was noted to be having shortness of breath, EMS was activated, on arrival of EMS team, patient was noted to be hypoxic with an O2 sat of 70% on room air and he was "gurgling".  Somnolent oxygen via BVM was provided with improvement in O2 sat to 84%.  No further history obtainable at this time due to patient's current condition.  ED Course:  In the emergency department, he was febrile, tachycardic and O2 sat was 97% on NRB at 15 L/min.  Work-up in the ED showed normal CBC except for leukocytosis with a left shift, BMP was normal except for hyperglycemia, BUN/creatinine 20/1.29 (this was 0.9 about 9 months ago).  Lactic acid was 2.4 > 1.7, urinalysis was unimpressive for UTI.  SARS coronavirus 2 was positive, influenza A, B was negative. Chest x-ray showed streaky bibasilar atelectasis.  Chronic interstitial coarsening He was treated with IV Unasyn, Tylenol was given.  IV hydration per sepsis protocol was provided without response to fluid, family was contacted regarding central line or peripheral pressors, but patient's POA insisted against any of those measures since patient was deemed to have  been clinically declining since the last 6 months per ED physician (Dr. Roxanne Mins).  Hospitalist was asked to admit patient for further evaluation and management.  Review of Systems: Unable to review all systems due to the inability of the patient to answer questions.   Past Medical History:  Diagnosis Date   Alzheimer's dementia [facility])    Atrial fibrillation, transient ([place])    a. Post-op from CABG 08/2015.   CAD (coronary artery disease)    a. DES to the RCA March 2013 with residual dz rx medically. b. s/p CABGx4 in 08/2015.   Collagen vascular disease ([place])    COPD (chronic obstructive pulmonary disease) (HCC)    Dysphagia    Essential hypertension    GERD (gastroesophageal reflux disease)    History of stroke    January 2017   HLD (hyperlipidemia)    a. H/o multiple statin intolerances.   Hx of adenomatous colonic polyps    Hyperlipidemia    IBS (irritable bowel syndrome)    Nephrolithiasis    NSTEMI (non-ST elevated myocardial infarction) [facility]) March 2013   Obesity    Stroke [facility])    Past Surgical History:  Procedure Laterality Date   APPENDECTOMY     BACK SURGERY     CARDIAC CATHETERIZATION N/A 08/09/2015   Procedure: Left Heart Cath and Coronary Angiography;  Surgeon: Jettie Booze, MD;  Location: [place] [facility];  Service: Cardiovascular;  Laterality: N/A;   COLONOSCOPY  08/21/2004   Pedunculated polyp at the splenic flexure   COLONOSCOPY  09/2009   Dr. Rourk--> Cecal and hepatic flexure polyps, tubular adenomas. Next colonoscopy October 2013   COLONOSCOPY N/A 05/07/2013   RMR: colonic polyp/colonic diverticulosis   CORONARY ARTERY BYPASS GRAFT N/A 08/11/2015   Procedure: CORONARY ARTERY BYPASS GRAFTING (CABG) X4 UTILIZING THE LEFT INTERNAL MAMMARY ARTERY TO LAD AND ENDOSCOPICALLY HARVESTED BILATERAL SAPHENEOUS VEINS TO DIAGONAL,OM1 AND OM2;  Surgeon: Ivin Poot, MD;  Location: [place];  Service: Open Heart Surgery;  Laterality: N/A;   ESOPHAGOGASTRODUODENOSCOPY   2003   distal erosion/small hiatal hernia   ESOPHAGOGASTRODUODENOSCOPY  09/2009   Dr. Evalee Mutton ring, mild distal esophageal erosions, status post Maloney dilation, small hiatal hernia.   ESOPHAGOGASTRODUODENOSCOPY N/A 03/16/2015   NOM:[facility] HH/focal area of distal esophageal s/p bx   LEFT HEART CATHETERIZATION WITH CORONARY ANGIOGRAM N/A 03/02/2012   Procedure: LEFT HEART CATHETERIZATION WITH CORONARY ANGIOGRAM;  Surgeon: Peter M Martinique, MD;  Location: [facility] CATH LAB;  Service: Cardiovascular;  Laterality: N/A;   PERCUTANEOUS CORONARY STENT INTERVENTION (PCI-S) N/A 03/02/2012   Procedure: PERCUTANEOUS CORONARY STENT INTERVENTION (PCI-S);  Surgeon: Peter M Martinique, MD;  Location: [facility] C[facility] LAB;  Service: Cardiovascular;  Laterality: N/A;   TEE WITHOUT CARDIOVERSION N/A 08/11/2015   Procedure: TRANSESOPHAGEAL ECHOCARDIOGRAM (TEE);  Surgeon: Ivin Poot, MD;  Location: [place];  Service: Open Heart Surgery;  Laterality: N/A;    Social History:  reports that he quit smoking about 20 years ago. His smoking use included cigarettes. He has a 67.50 pack-year smoking history. He has never used smokeless tobacco. He reports that he does not drink alcohol and does not use drugs.   Allergies  Allergen Reactions   Codeine Other (See Comments)    Pt does not know    Statins Other (See Comments) and Nausea And Vomiting    Family History  Problem Relation Age of Onset   Pneumonia Father    Heart disease Mother    Diabetes Mother    Stroke Mother    Stroke Sister    Breast cancer Sister        2 sisters   Esophageal cancer Brother    Colon cancer Neg Hx      Prior to Admission medications   Medication Sig Start Date End Date Taking? Authorizing Provider  acetaminophen (TYLENOL) 325 MG tablet Take 2 tablets (650 mg total) by mouth every 6 (six) hours as needed for mild pain, fever or headache. Patient taking differently: Take 325 mg by mouth every 6 (six) hours as needed for mild pain or fever.  08/24/15   Barrett, Erin R, PA-C  albuterol (PROVENTIL HFA;VENTOLIN HFA) 108 (90 Base) MCG/ACT inhaler Inhale 2 puffs into the lungs every 6 (six) hours as needed for wheezing or shortness of breath. Patient taking differently: Inhale 2 puffs into the lungs 3 (three) times daily. 09/08/17   Orson Eva, MD  nystatin cream (MYCOSTATIN) Apply 1 application topically 2 (two) times daily. 10 day course for groin redness/irritation    [provider]  risperiDONE (RISPERDAL) 0.5 MG tablet Take 0.5 mg by mouth 2 (two) times daily.    [provider]  sertraline (ZOLOFT) 25 MG tablet Take 50 mg by mouth every morning.    [provider]  tamsulosin (FLOMAX) 0.4 MG CAPS capsule Take 0.4 mg by mouth daily. 04/12/16   [provider]  Zinc Oxide 12 % CREA Apply 1 application topically 3 (three) times daily as needed (redness).    [provider]    Physical Exam: BP Marland Kitchen)  95/36   Pulse 81   Temp 99.2 F (37.3 C) (Axillary)   Resp (!) 23   Ht '6\' 1"'$  (1.854 m)   Wt 114.4 kg   SpO2 95%   BMI 33.27 kg/m   General: 86 y.o. year-old male ill appearing, gurgling, somnolent, but arousable with mild sternal rub.   HEENT: NCAT, EOMI Neck: Supple, trachea medial Cardiovascular: Regular rate and rhythm with no rubs or gallops.  No thyromegaly or JVD noted.  No lower extremity edema. 2/4 pulses in all 4 extremities. Respiratory: Clear to auscultation with no wheezes or rales. Good inspiratory effort. Abdomen: Soft, nontender nondistended with normal bowel sounds x4 quadrants. Muskuloskeletal: No cyanosis, clubbing or edema noted bilaterally Neuro: Sensation, reflexes intact Skin: No ulcerative lesions noted or rashes Psychiatry: Judgement and insight appear normal. Mood is appropriate for condition and setting          Labs on Admission:  Basic Metabolic Panel: Recent Labs  Lab 10/12/22 2153  NA 135  K 4.7  CL 100  CO2 27  GLUCOSE 179*  BUN 28*  CREATININE  1.29*  CALCIUM 8.7*   Liver Function Tests: Recent Labs  Lab 10/12/22 2153  AST 20  ALT 15  ALKPHOS 74  BILITOT 1.3*  PROT 7.5  ALBUMIN 3.8   No results for input(s): "LIPASE", "AMYLASE" in the last 168 hours. No results for input(s): "AMMONIA" in the last 168 hours. CBC: Recent Labs  Lab 10/12/22 2153 10/13/22 0632  WBC 12.7* 17.5*  NEUTROABS 10.6* 14.8*  HGB 14.4 13.3  HCT 44.9 42.5  MCV 90.7 92.8  PLT 171 142*   Cardiac Enzymes: No results for input(s): "CKTOTAL", "CKMB", "CKMBINDEX", "TROPONINI" in the last 168 hours.  BNP (last 3 results) No results for input(s): "BNP" in the last 8760 hours.  ProBNP (last 3 results) No results for input(s): "PROBNP" in the last 8760 hours.  CBG: No results for input(s): "GLUCAP" in the last 168 hours.  Radiological Exams on Admission: DG Chest Port 1 View  Result Date: 10/12/2022 CLINICAL DATA:  Questionable sepsis - evaluate for abnormality EXAM: PORTABLE CHEST 1 VIEW COMPARISON:  01/07/2022 FINDINGS: Prior median sternotomy. Stable heart size and mediastinal contours. Aortic atherosclerosis. Chronic interstitial coarsening. Streaky atelectasis in the lung bases. No pleural fluid or pneumothorax. No pulmonary edema. IMPRESSION: Streaky bibasilar atelectasis. Chronic interstitial coarsening. Electronically Signed   By: Keith Rake M.D.   On: 10/12/2022 23:01    EKG: I independently viewed the EKG done and my findings are as followed: Sinus tachycardia at a rate of 123 beats per minute  Assessment/Plan Present on Admission:  Septic shock ([place])  Aspiration pneumonia ([place])  COVID-19 virus infection  AKI (acute kidney injury) ([place])  Principal Problem:   Septic shock ([place]) Active Problems:   Dysphagia   Aspiration pneumonia (HCC)   Lactic acidosis   COVID-19 virus infection   Hyperglycemia   AKI (acute kidney injury) ([place])   History of stroke  Septic shock possibly secondary to presumed aspiration  pneumonia Patient was started on IV Unasyn, we shall continue with same at this time and add azithromycin with plan to de-escalate/discontinue based on blood culture, sputum culture, urine Legionella, strep pneumo and procalcitonin Continue Tylenol as needed Continue Mucinex, incentive spirometry, flutter valve when patient is able to cooperate Patient will be placed n.p.o. at this time due to increased aspiration risk  COVID-19 virus infection Continue albuterol q.6h when patient is able to cooperate Continue IV Solu-Medrol Continue IV Remdesivir  per pharmacy protocol Continue on Protonix to prevent steroid and stress-induced ulcer Continue ISS and hypoglycemic protocol in the setting of steroid use Continue Tylenol p.r.n. for fever Continue supplemental oxygen to maintain O2 sat > or = 94% with plan to wean patient off supplemental oxygen as tolerated (of note, patient does not use oxygen at baseline) Continue incentive spirometry and flutter valve Continue airborne isolation precaution Continue monitoring daily inflammatory markers  Lactic acidosis-resolved Lactic acid 2.4 > 1.7  Dysphagia Patient has a history of dysphagia and appears to have increased risk of aspiration He will be n.p.o. at this time Continue SLP eval and treat  Hyperglycemia CBG 179, no known history of T2DM Continue ISS and hypoglycemic protocol  Acute kidney injury BUN/creatinine 20/1.29 (this was 0.9 about 9 months ago). Continue gentle hydration Renally adjust medications, avoid nephrotoxic agents/dehydration/hypotension  History of stroke with residual dysphagia Swallow eval prior to oral intake.  COPD (not in acute exacerbation) Continue albuterol inhaler when patient is able to cooperate   Goals of care: Palliative care was consulted  DVT prophylaxis: Lovenox  Code Status: DNR  Consults: None  Family Communication: Daughters (all questions answered to satisfaction) Discussion was held  with patient's daughters, they want to see if patient will respond to current treatment without pressors or any other invasive procedure(s) prior to deciding on placing patient on comfort care.  Severity of Illness: The appropriate patient status for this patient is INPATIENT. Inpatient status is judged to be reasonable and necessary in order to provide the required intensity of service to ensure the patient's safety. The patient's presenting symptoms, physical exam findings, and initial radiographic and laboratory data in the context of their chronic comorbidities is felt to place them at high risk for further clinical deterioration. Furthermore, it is not anticipated that the patient will be medically stable for discharge from the hospital within 2 midnights of admission.   * I certify that at the point of admission it is my clinical judgment that the patient will require inpatient hospital care spanning beyond 2 midnights from the point of admission due to high intensity of service, high risk for further deterioration and high frequency of surveillance required.*  Author: Bernadette Hoit, DO 10/13/2022 6:56 AM  For on call review [email or web address].

## 2022-10-13 NOTE — Progress Notes (Signed)
Tyler Mejia is a 86 y.o. male with medical history significant of COPD, BPH, CAD s/p CABG x4, NSTEMI (2013), history of stroke (January 2017) with residual difficulty in swallowing, depression who presents to the emergency department via EMS due to shortness of breath which was thought to be due to aspiration.  He presented from his facility with worsening hypoxemia and gurgling.  Daughter states that he has been deteriorating over the last several months from his dementia and was noted to have residual difficulty with dysphagia after CVA in 2017.  He was admitted with COVID-pneumonia as well as suspicion for aspiration pneumonia and started on antivirals as well as multiple antibiotics.  Daughter understands that patient's clinical condition is deteriorating and that his prognosis appears to be hours to days.  She is agreeable to comfort care and palliative has been consulted.  Patient seen and evaluated at bedside this AM and has been admitted after midnight.  Anticipate in-hospital death.  Total care time: 45 minutes.

## 2022-10-13 NOTE — Progress Notes (Signed)
Pt transferred to room 312.  Daughter present for transfer and aware of room change, has notified other family members.  All belongings taken with patient and chart taken to floor.  Report given to [place], [place] at [phone].

## 2022-10-13 NOTE — ED Notes (Signed)
Patient is able to "respond" to his name by looking up at this RN, but is unable to verbally communicate d/t secretions in back of throat. Patient's oral secretions are suctioned every 20 minutes by this RN.

## 2022-10-13 NOTE — Progress Notes (Signed)
Placed [facility] in room , Patient on NRB mask. Will try to titrate patient off of NRB mask.

## 2022-10-13 NOTE — Progress Notes (Signed)
  Transition of Care [facility]) Screening Note   Patient Details  Name: Tyler Mejia Date of Birth: 1936/02/12   Transition of Care [facility]) CM/SW Contact:    Iona Beard, [place] Phone Number: 10/13/2022, 10:44 AM    Transition of Care Department [facility]) has reviewed patient and no TOC needs have been identified at this time. We will continue to monitor patient advancement through interdisciplinary progression rounds. If new patient transition needs arise, please place a TOC consult.

## 2022-10-14 ENCOUNTER — Encounter (HOSPITAL_COMMUNITY): Payer: Self-pay | Admitting: Internal Medicine

## 2022-10-14 DIAGNOSIS — J9601 Acute respiratory failure with hypoxia: Secondary | ICD-10-CM | POA: Diagnosis not present

## 2022-10-14 DIAGNOSIS — Z7189 Other specified counseling: Secondary | ICD-10-CM

## 2022-10-14 DIAGNOSIS — Z515 Encounter for palliative care: Secondary | ICD-10-CM | POA: Diagnosis not present

## 2022-10-14 DIAGNOSIS — R6521 Severe sepsis with septic shock: Secondary | ICD-10-CM | POA: Diagnosis not present

## 2022-10-14 DIAGNOSIS — A419 Sepsis, unspecified organism: Secondary | ICD-10-CM | POA: Diagnosis not present

## 2022-10-14 LAB — URINE CULTURE: Culture: NO GROWTH

## 2022-10-14 NOTE — TOC Progression Note (Signed)
Transition of Care [facility]) - Progression Note    Patient Details  Name: Tyler Mejia MRN: [id number] Date of Birth: 1936-08-31  Transition of Care [facility]) CM/SW Contact  Salome Arnt, [place] Phone Number: 10/14/2022, 1:46 PM  Clinical Narrative:  LCSW followed up with pt's daughter, [name] after palliative conversation. She is agreeable to refer to [facility] for residential hospice. Referral made and Keka at hospice notified. TOC will continue to follow.        Barriers to Discharge: Continued Medical Work up  Expected Discharge Plan and Services                                                 Social Determinants of Health (SDOH) Interventions    Readmission Risk Interventions     No data to display

## 2022-10-14 NOTE — Progress Notes (Signed)
Patient received resting in bed. Patient started on comfort care during the day shift. Needs anticipated, incontinent of bowel and bladder. Pt safety risks identified, addressed, and maintained to prevent injury. Comfort and hygiene measures provided. Bed in lowest position. Call light within reach. Staff called from the ALF asking [place] questions, RN informed that they will need to get updates from the patient's family due to HIPAA laws. ALF staff was verbally aggressive as she stated that she was never told this by anybody else when they send patient to the hospital all the time. The staff refused to provider her name to the RN. The ALF staff was encouraged to speak with the charge nurse or the nursing supervisor and agreed. The charge nurse was made aware. Will continue to monitor and endorse

## 2022-10-14 NOTE — Consult Note (Signed)
Consultation Note Date: 10/14/2022   Patient Name: Tyler Mejia  DOB: May 19, 1936  MRN: [id number]  Age / Sex: 86 y.o., male  PCP: [name], MD Referring Physician: Rodena Goldmann, DO  Reason for Consultation: Establishing goals of care  HPI/Patient Profile: 86 y.o. male  with past medical history of Alzheimer's/vascular dementia, A-fib, COPD, BPH, CAD status post CABG x4, NSTEMI in 2013, history of stroke January 2017 with residual difficulty in swallowing, HTN/HLD, GERD, obesity, depression, resident of [facility] admitted on 10/12/2022 with septic shock.   Clinical Assessment and Goals of Care: I have reviewed medical records including EPIC notes, labs and imaging, received report from RN, assessed the patient.  Tyler Mejia is lying quietly in bed.  He appears acutely/chronically ill and quite frail.  He is alert, making and somewhat keeping eye contact.  He has known dementia, and is able to tell me his name only.  I am not sure that he can make his basic needs known.  There is no family at bedside at this time.   Call to Tyler Mejia, to discuss diagnosis prognosis, [place], EOL wishes, disposition and options.  I introduced Palliative Medicine as specialized medical care for people living with serious illness. It focuses on providing relief from the symptoms and stress of a serious illness. The goal is to improve quality of life for both the patient and the family.  We discussed a brief life review of the patient.  Tyler Mejia has been living at [facility] in [place].  Daughter states that he left [facility] about a year ago.  She shares that he has had declines over the last few months. and that [facility] suggested that he needs skilled nursing care over the last few weeks.   We then focused on their current illness.  Over the weekend Tyler Mejia has been transitioned to comfort  care.  Roxanne's shares that she has seen a rally with Tyler Mejia starting last night.  She shares that she understands rally to be a common occurrence.  She shares that it seems that he is "improving".  I shared that fundamentally, nothing has changed for Tyler Mejia.  He continues to be acutely ill with a heavy chronic illness burden.  She agrees.  Hospice Care services outpatient were explained and offered.  We talk about the benefits of residential hospice for comfort and dignity at end-of-life.  Discussed the importance of continued conversation with family and the medical providers regarding overall plan of care and treatment options, ensuring decisions are within the context of the patient's values and GOCs.  Questions and concerns were addressed.  The family was encouraged to call with questions or concerns.  PMT will continue to support holistically.  Conference with attending, bedside nursing staff, transition of care team related to patient condition, needs, goals of care, disposition.   HCPOA NEXT OF KIN     SUMMARY OF RECOMMENDATIONS   Full comfort care 11/5 Residential hospice referral with [facility].  Code Status/Advance Care Planning: DNR  Symptom Management:  Per hospitalist, no additional needs at this time.  Palliative Prophylaxis:  Frequent Pain Assessment, Oral Care, and Turn Reposition  Additional Recommendations (Limitations, Scope, Preferences): Full Comfort Care  Psycho-social/Spiritual:  Desire for further Chaplaincy support:no Additional Recommendations: Caregiving  Support/Resources and Education on Hospice  Prognosis:  < 2 weeks, based on acute illness, septic shock, decreasing functional status, family's desire to focus on comfort and dignity at end-of-life.  Discharge Planning: Hospice facility      Primary Diagnoses: Present on Admission:  Septic shock ([place])  Aspiration pneumonia ([place])  COVID-19 virus infection  AKI (acute kidney  injury) ([place])   I have reviewed the medical record, interviewed the patient and family, and examined the patient. The following aspects are pertinent.  Past Medical History:  Diagnosis Date   Alzheimer's dementia [facility])    Atrial fibrillation, transient ([place])    a. Post-op from CABG 08/2015.   CAD (coronary artery disease)    a. DES to the RCA March 2013 with residual dz rx medically. b. s/p CABGx4 in 08/2015.   Collagen vascular disease ([place])    COPD (chronic obstructive pulmonary disease) (HCC)    Dysphagia    Essential hypertension    GERD (gastroesophageal reflux disease)    History of stroke    January 2017   HLD (hyperlipidemia)    a. H/o multiple statin intolerances.   Hx of adenomatous colonic polyps    Hyperlipidemia    IBS (irritable bowel syndrome)    Nephrolithiasis    NSTEMI (non-ST elevated myocardial infarction) [facility]) March 2013   Obesity    Stroke [facility])    Social History   Socioeconomic History   Marital status: Divorced    Spouse name: Not on file   Number of children: Not on file   Years of education: Not on file   Highest education level: Not on file  Occupational History   Occupation: retired Hydrologist  Tobacco Use   Smoking status: Former    Packs/day: 1.50    Years: 45.00    Total pack years: 67.50    Types: Cigarettes    Quit date: 12/09/2001    Years since quitting: 20.8   Smokeless tobacco: Never   Tobacco comments:    quit about 9 years ago  Substance and Sexual Activity   Alcohol use: No    Alcohol/week: 0.0 standard drinks of alcohol    Comment: Very rarely   Drug use: No   Sexual activity: Not Currently  Other Topics Concern   Not on file  Social History Narrative   Not on file   Social Determinants of Health   Financial Resource Strain: Not on file  Food Insecurity: Not on file  Transportation Needs: Not on file  Physical Activity: Not on file  Stress: Not on file  Social Connections: Not on file   Family History   Problem Relation Age of Onset   Pneumonia Father    Heart disease Mother    Diabetes Mother    Stroke Mother    Stroke Sister    Breast cancer Sister        2 sisters   Esophageal cancer Brother    Colon cancer Neg Hx    Scheduled Meds: Continuous Infusions: PRN Meds:.albuterol, fentaNYL (SUBLIMAZE) injection, glycopyrrolate, glycopyrrolate **OR** glycopyrrolate **OR** glycopyrrolate, LORazepam **OR** LORazepam **OR** LORazepam, ondansetron **OR** ondansetron (ZOFRAN) IV Medications Prior to Admission:  Prior to Admission medications   Medication Sig  Start Date End Date Taking? Authorizing Provider  acetaminophen (TYLENOL) 325 MG tablet Take 2 tablets (650 mg total) by mouth every 6 (six) hours as needed for mild pain, fever or headache. Patient taking differently: Take 325 mg by mouth every 6 (six) hours as needed for mild pain or fever. 08/24/15  Yes Barrett, Erin R, PA-C  albuterol (PROVENTIL HFA;VENTOLIN HFA) 108 (90 Base) MCG/ACT inhaler Inhale 2 puffs into the lungs every 6 (six) hours as needed for wheezing or shortness of breath. Patient taking differently: Inhale 2 puffs into the lungs 3 (three) times daily. 09/08/17  Yes Tat, Shanon Brow, MD  cholecalciferol (VITAMIN D3) 25 MCG (1000 UNIT) tablet Take 2,000 Units by mouth daily.   Yes [provider]  metFORMIN (GLUCOPHAGE) 500 MG tablet Take 250 mg by mouth daily. 07/25/22  Yes [provider]  nystatin cream (MYCOSTATIN) Apply 1 application topically 2 (two) times daily. 10 day course for groin redness/irritation   Yes [provider]  risperiDONE (RISPERDAL) 0.5 MG tablet Take 0.5 mg by mouth 2 (two) times daily.   Yes [provider]  sertraline (ZOLOFT) 25 MG tablet Take 37.5 mg by mouth every morning.   Yes [provider]  tamsulosin (FLOMAX) 0.4 MG CAPS capsule Take 0.4 mg by mouth daily. 04/12/16  Yes [provider]  Zinc Oxide 12 % CREA Apply 1 application topically 3  (three) times daily as needed (redness).   Yes [provider]   Allergies  Allergen Reactions   Codeine Other (See Comments)    Pt does not know    Statins Other (See Comments) and Nausea And Vomiting   Review of Systems  Unable to perform ROS: Dementia    Physical Exam Vitals and nursing note reviewed.  Constitutional:      General: He is not in acute distress.    Appearance: He is obese. He is ill-appearing.  Pulmonary:     Effort: Pulmonary effort is normal. No respiratory distress.  Musculoskeletal:        General: Swelling present.  Skin:    General: Skin is warm and dry.     Findings: Bruising present.  Neurological:     Mental Status: He is alert.     Comments: Known dementia  Psychiatric:     Comments: Calm and cooperative, not fearful     Vital Signs: BP (!) 127/50 (BP Location: Left Arm)   Pulse 66   Temp 97.6 F (36.4 C) (Oral)   Resp 15   Ht '6\' 1"'$  (1.854 m)   Wt 114.4 kg   SpO2 98%   BMI 33.27 kg/m  Pain Scale: 0-10   Pain Score: Asleep   SpO2: SpO2: 98 % O2 Device:SpO2: 98 % O2 Flow Rate: .O2 Flow Rate (L/min): 5 L/min  IO: Intake/output summary:  Intake/Output Summary (Last 24 hours) at 10/14/2022 1235 Last data filed at 10/13/2022 2036 Gross per 24 hour  Intake 291.07 ml  Output 500 ml  Net -208.93 ml    LBM: Last BM Date : 10/12/22 Baseline Weight: Weight: 107 kg Most recent weight: Weight: 114.4 kg     Palliative Assessment/Data:   Flowsheet Rows    Flowsheet Row Most Recent Value  Intake Tab   Referral Department Hospitalist  Unit at Time of Referral Med/Surg Unit  Palliative Care Primary Diagnosis Sepsis/Infectious Disease  Date Notified 10/13/22  Palliative Care Type New Palliative care  Reason for referral Clarify Goals of Care  Date of Admission 10/12/22  Date first seen by Palliative Care 10/14/22  # of days Palliative referral response time 1 Day(s)  # of days IP prior to Palliative referral 1  Clinical  Assessment   Palliative Performance Scale Score 20%  Pain Max last 24 hours Not able to report  Pain Min Last 24 hours Not able to report  Dyspnea Max Last 24 Hours Not able to report  Dyspnea Min Last 24 hours Not able to report  Psychosocial & Spiritual Assessment   Palliative Care Outcomes        Time In: 0910   Time Out: 1025  Time Total: 75 minutes  Greater than 50%  of this time was spent counseling and coordinating care related to the above assessment and plan.  Signed by: Drue Novel, NP   Please contact Palliative Medicine Team phone at [phone] for questions and concerns.  For individual provider: See Shea Evans

## 2022-10-14 NOTE — Progress Notes (Signed)
PROGRESS NOTE    Tyler Mejia  DPO:[id number] DOB: 19-Sep-1936 DOA: 10/12/2022 PCP: [name], MD   Brief Narrative:    Tyler Mejia is a 86 y.o. male with medical history significant of COPD, BPH, CAD s/p CABG x4, NSTEMI (2013), history of stroke (January 2017) with residual difficulty in swallowing, depression who presents to the emergency department via EMS due to shortness of breath which was thought to be due to aspiration.  He presented from his facility with worsening hypoxemia and gurgling.  Daughter states that he has been deteriorating over the last several months from his dementia and was noted to have residual difficulty with dysphagia after CVA in 2017.  He was admitted with COVID-pneumonia as well as suspicion for aspiration pneumonia and started on antivirals as well as multiple antibiotics.  Daughter understands that patient's clinical condition is deteriorating and that his prognosis appears to be hours to days.  She is agreeable to comfort care and palliative has been consulted.   Assessment & Plan:   Principal Problem:   Septic shock ([place]) Active Problems:   Dysphagia   Aspiration pneumonia (HCC)   Lactic acidosis   COVID-19 virus infection   Hyperglycemia   AKI (acute kidney injury) ([place])   History of stroke  Assessment and Plan:  Septic shock, present on admission secondary to presumed aspiration pneumonia  COVID-19 viral pneumonia  Chronic dysphagia in the setting of prior CVA and dementia  Type 2 diabetes with hyperglycemia  AKI  History of CVA and residual dysphagia  COPD  Obesity  Patient is now comfort care due to poor prognosis.  Palliative care consulted.  Anticipate disposition to residential hospice versus home hospice after further discussion.    DVT prophylaxis: None Code Status: Full Family Communication: Discussed with daughter on phone 11/5 Disposition Plan:  Status is: Inpatient Remains inpatient appropriate because: Need for IV  medications.  Consultants:  Palliative  Procedures:  None  Antimicrobials:  Anti-infectives (From admission, onward)    Start     Dose/Rate Route Frequency Ordered Stop   10/14/22 1000  remdesivir 100 mg in sodium chloride 0.9 % 100 mL IVPB  Status:  Discontinued       See Hyperspace for full Linked Orders Report.   100 mg 200 mL/hr over 30 Minutes Intravenous Daily 10/13/22 0540 10/13/22 0541   10/14/22 1000  remdesivir 100 mg in sodium chloride 0.9 % 100 mL IVPB  Status:  Discontinued       See Hyperspace for full Linked Orders Report.   100 mg 200 mL/hr over 30 Minutes Intravenous Daily 10/13/22 0542 10/13/22 1146   10/13/22 0800  Ampicillin-Sulbactam (UNASYN) 3 g in sodium chloride 0.9 % 100 mL IVPB  Status:  Discontinued        3 g 200 mL/hr over 30 Minutes Intravenous Every 6 hours 10/13/22 0738 10/13/22 1146   10/13/22 0657  remdesivir 100 mg in sodium chloride 0.9 % 100 mL IVPB       See Hyperspace for full Linked Orders Report.   100 mg 200 mL/hr over 30 Minutes Intravenous  Once 10/13/22 0542 10/13/22 1047   10/13/22 0630  remdesivir 100 mg in sodium chloride 0.9 % 100 mL IVPB       See Hyperspace for full Linked Orders Report.   100 mg 200 mL/hr over 30 Minutes Intravenous  Once 10/13/22 0542 10/13/22 1013   10/13/22 0615  remdesivir 200 mg in sodium chloride 0.9% 250 mL IVPB  Status:  Discontinued       See Hyperspace for full Linked Orders Report.   200 mg 580 mL/hr over 30 Minutes Intravenous Once 10/13/22 0540 10/13/22 0541   10/13/22 0600  azithromycin (ZITHROMAX) 500 mg in sodium chloride 0.9 % 250 mL IVPB  Status:  Discontinued        500 mg 250 mL/hr over 60 Minutes Intravenous Every 24 hours 10/13/22 0512 10/13/22 1146   10/13/22 0600  Ampicillin-Sulbactam (UNASYN) 3 g in sodium chloride 0.9 % 100 mL IVPB  Status:  Discontinued        3 g 200 mL/hr over 30 Minutes Intravenous Every 6 hours 10/13/22 0512 10/13/22 0738   10/12/22 2215  Ampicillin-Sulbactam  (UNASYN) 3 g in sodium chloride 0.9 % 100 mL IVPB        3 g 200 mL/hr over 30 Minutes Intravenous  Once 10/12/22 2207 10/12/22 2336      Subjective: Patient seen and evaluated today with no new acute complaints or concerns. No acute concerns or events noted overnight.  Objective: Vitals:   10/13/22 1100 10/13/22 1119 10/13/22 1200 10/13/22 2306  BP: (!) 141/62  (!) 143/56 (!) 127/50  Pulse: 70 77 72 66  Resp: 19 (!) '21 18 15  '$ Temp:  98.5 F (36.9 C)  97.6 F (36.4 C)  TempSrc:  Oral  Oral  SpO2: 95% 97% (!) 87% 98%  Weight:      Height:        Intake/Output Summary (Last 24 hours) at 10/14/2022 1050 Last data filed at 10/13/2022 2036 Gross per 24 hour  Intake 291.07 ml  Output 500 ml  Net -208.93 ml   Filed Weights   10/12/22 2157 10/13/22 0430  Weight: 107 kg 114.4 kg    Examination:  General exam: Appears calm and comfortable  Respiratory system: Clear to auscultation. Respiratory effort normal. Cardiovascular system: S1 & S2 heard, RRR.  Gastrointestinal system: Abdomen is soft Central nervous system: Alert and awake Extremities: No edema Skin: No significant lesions noted Psychiatry: Flat affect.    Data Reviewed: I have personally reviewed following labs and imaging studies  CBC: Recent Labs  Lab 10/12/22 2153 10/13/22 0632  WBC 12.7* 17.5*  NEUTROABS 10.6* 14.8*  HGB 14.4 13.3  HCT 44.9 42.5  MCV 90.7 92.8  PLT 171 979*   Basic Metabolic Panel: Recent Labs  Lab 10/12/22 2153 10/13/22 0632  NA 135 139  K 4.7 5.0  CL 100 105  CO2 27 26  GLUCOSE 179* 139*  BUN 28* 25*  CREATININE 1.29* 0.98  CALCIUM 8.7* 8.3*  MG  --  1.8  PHOS  --  4.6   GFR: Estimated Creatinine Clearance: 74.4 mL/min (by C-G formula based on SCr of 0.98 mg/dL). Liver Function Tests: Recent Labs  Lab 10/12/22 2153 10/13/22 0632  AST 20 22  ALT 15 16  ALKPHOS 74 67  BILITOT 1.3* 0.9  PROT 7.5 7.3  ALBUMIN 3.8 3.7   No results for input(s): "LIPASE",  "AMYLASE" in the last 168 hours. No results for input(s): "AMMONIA" in the last 168 hours. Coagulation Profile: Recent Labs  Lab 10/12/22 2153  INR 1.0   Cardiac Enzymes: No results for input(s): "CKTOTAL", "CKMB", "CKMBINDEX", "TROPONINI" in the last 168 hours. BNP (last 3 results) No results for input(s): "PROBNP" in the last 8760 hours. HbA1C: No results for input(s): "HGBA1C" in the last 72 hours. CBG: Recent Labs  Lab 10/13/22 0919 10/13/22 1118  GLUCAP 128* 118*   Lipid  Profile: No results for input(s): "CHOL", "HDL", "LDLCALC", "TRIG", "CHOLHDL", "LDLDIRECT" in the last 72 hours. Thyroid Function Tests: No results for input(s): "TSH", "T4TOTAL", "FREET4", "T3FREE", "THYROIDAB" in the last 72 hours. Anemia Panel: Recent Labs    10/13/22 0632  FERRITIN 193   Sepsis Labs: Recent Labs  Lab 10/12/22 2153 10/13/22 0020 10/13/22 0536  PROCALCITON  --   --  0.41  LATICACIDVEN 2.4* 1.7  --     Recent Results (from the past 240 hour(s))  Blood Culture (routine x 2)     Status: None (Preliminary result)   Collection Time: 10/12/22 10:13 PM   Specimen: BLOOD LEFT HAND  Result Value Ref Range Status   Specimen Description BLOOD LEFT HAND  Final   Special Requests   Final    BOTTLES DRAWN AEROBIC AND ANAEROBIC Blood Culture adequate volume   Culture   Final    NO GROWTH 2 DAYS Performed at [facility], [address] [place], [place] [address]    Report Status PENDING  Incomplete  Blood Culture (routine x 2)     Status: None (Preliminary result)   Collection Time: 10/12/22 10:16 PM   Specimen: Right Antecubital; Blood  Result Value Ref Range Status   Specimen Description RIGHT ANTECUBITAL  Final   Special Requests   Final    BOTTLES DRAWN AEROBIC AND ANAEROBIC Blood Culture results may not be optimal due to an excessive volume of blood received in culture bottles   Culture   Final    NO GROWTH 2 DAYS Performed at [facility], [address] [place],  [place] [address]    Report Status PENDING  Incomplete  Resp Panel by RT-PCR (Flu A&B, Covid) Anterior Nasal Swab     Status: Abnormal   Collection Time: 10/12/22 10:25 PM   Specimen: Anterior Nasal Swab  Result Value Ref Range Status   SARS Coronavirus 2 by RT PCR POSITIVE (A) NEGATIVE Final    Comment: (NOTE) SARS-CoV-2 target nucleic acids are DETECTED.  The SARS-CoV-2 RNA is generally detectable in upper respiratory specimens during the acute phase of infection. Positive results are indicative of the presence of the identified virus, but do not rule out bacterial infection or co-infection with other pathogens not detected by the test. Clinical correlation with patient history and other diagnostic information is necessary to determine patient infection status. The expected result is Negative.  Fact Sheet for Patients: EntrepreneurPulse.com.au  Fact Sheet for Healthcare Providers: IncredibleEmployment.be  This test is not yet approved or cleared by the [place] FDA and  has been authorized for detection and/or diagnosis of SARS-CoV-2 by FDA under an Emergency Use Authorization (EUA).  This EUA will remain in effect (meaning this test can be used) for the duration of  the COVID-19 declaration under Section 564(b)(1) of the A ct, 21 U.S.C. section 360bbb-3(b)(1), unless the authorization is terminated or revoked sooner.     Influenza A by PCR NEGATIVE NEGATIVE Final   Influenza B by PCR NEGATIVE NEGATIVE Final    Comment: (NOTE) The Xpert Xpress SARS-CoV-2/FLU/RSV plus assay is intended as an aid in the diagnosis of influenza from Nasopharyngeal swab specimens and should not be used as a sole basis for treatment. Nasal washings and aspirates are unacceptable for Xpert Xpress SARS-CoV-2/FLU/RSV testing.  Fact Sheet for Patients: EntrepreneurPulse.com.au  Fact Sheet for Healthcare  Providers: IncredibleEmployment.be  This test is not yet approved or cleared by the [place] FDA and has been authorized for detection and/or diagnosis of  SARS-CoV-2 by FDA under an Emergency Use Authorization (EUA). This EUA will remain in effect (meaning this test can be used) for the duration of the COVID-19 declaration under Section 564(b)(1) of the Act, 21 U.S.C. section 360bbb-3(b)(1), unless the authorization is terminated or revoked.  Performed at [facility], [address] [place], [place] [address]   Urine Culture     Status: None   Collection Time: 10/13/22  2:33 AM   Specimen: In/Out Cath Urine  Result Value Ref Range Status   Specimen Description   Final    IN/OUT CATH URINE Performed at [facility], [address], [place], [place] [address]    Special Requests   Final    NONE Performed at [facility], [address], [place], [place] [address]    Culture   Final    NO GROWTH Performed at [facility], [place] [address], [place], [place] [address]    Report Status 10/14/2022 FINAL  Final  MRSA Next Gen by PCR, Nasal     Status: None   Collection Time: 10/13/22  4:59 AM   Specimen: Nasal Mucosa; Nasal Swab  Result Value Ref Range Status   MRSA by PCR Next Gen NOT DETECTED NOT DETECTED Final    Comment: (NOTE) The GeneXpert MRSA Assay (FDA approved for NASAL specimens only), is one component of a comprehensive MRSA colonization surveillance program. It is not intended to diagnose MRSA infection nor to guide or monitor treatment for MRSA infections. Test performance is not FDA approved in patients less than 60 years old. Performed at [facility], [address] [place], [place] [address]          Radiology Studies: [facility] 1 View  Result Date: 10/12/2022 CLINICAL DATA:  Questionable sepsis - evaluate for abnormality EXAM: PORTABLE CHEST 1 VIEW COMPARISON:  01/07/2022 FINDINGS: Prior median sternotomy. Stable heart size  and mediastinal contours. Aortic atherosclerosis. Chronic interstitial coarsening. Streaky atelectasis in the lung bases. No pleural fluid or pneumothorax. No pulmonary edema. IMPRESSION: Streaky bibasilar atelectasis. Chronic interstitial coarsening. Electronically Signed   By: Keith Rake M.D.   On: 10/12/2022 23:01       LOS: 1 day    Time spent: 35 minutes    Loralie Malta D Manuella Ghazi, DO [facility]  If 7PM-7AM, please contact night-coverage [email or web address] 10/14/2022, 10:50 AM

## 2022-10-14 NOTE — Progress Notes (Signed)
Daughter Roxanne in and updated on patient's uneventful night.

## 2022-10-15 DIAGNOSIS — R6521 Severe sepsis with septic shock: Secondary | ICD-10-CM | POA: Diagnosis not present

## 2022-10-15 DIAGNOSIS — A419 Sepsis, unspecified organism: Secondary | ICD-10-CM | POA: Diagnosis not present

## 2022-10-15 DIAGNOSIS — Z515 Encounter for palliative care: Secondary | ICD-10-CM

## 2022-10-15 NOTE — TOC Progression Note (Signed)
Transition of Care [facility]) - Progression Note    Patient Details  Name: Tyler Mejia MRN: [id number] Date of Birth: 1935-12-31  Transition of Care [facility] CM/SW [place], [place] Phone Number: 10/15/2022, 11:59 AM  Clinical Narrative:    CSW updated by [facility] RN that pt is unable to be admitted to their facility today. They are unable to admit until pt is day 5 post COVID positive test. CSW updated MD and RN. Hospice will reassess on Thursday. TOC to follow.    Barriers to Discharge: Continued Medical Work up  Expected Discharge Plan and Services                                                 Social Determinants of Health (SDOH) Interventions    Readmission Risk Interventions     No data to display

## 2022-10-15 NOTE — Progress Notes (Signed)
Palliative: Chart review completed.  Face-to-face conference with bedside nursing staff.  Overall Tyler Mejia is more lethargic today.  No symptom management needs identified by bedside nursing staff.  Conference with local hospice representative he shares that he would be considered for residential hospice placement on 11/9 due to COVID positive.  Conference with attending, bedside nursing staff, transition of care team, local hospice representative related to patient condition, needs, goals of care, disposition.  Plan: Comfort care.  End-of-life order set implemented.   No charge [name], NP Palliative medicine team Team phone [phone] Greater than 50% of this time was spent counseling and coordinating care related to the above assessment and plan.

## 2022-10-15 NOTE — Progress Notes (Signed)
PROGRESS NOTE    Tyler Mejia  ZOX:[id number] DOB: [birth year] DOA: 10/12/2022 PCP: [name], MD   Brief Narrative:    Tyler Mejia is a 86 y.o. male with medical history significant of COPD, BPH, CAD s/p CABG x4, NSTEMI (2013), history of stroke (January 2017) with residual difficulty in swallowing, depression who presents to the emergency department via EMS due to shortness of breath which was thought to be due to aspiration.  He presented from his facility with worsening hypoxemia and gurgling.  Daughter states that he has been deteriorating over the last several months from his dementia and was noted to have residual difficulty with dysphagia after CVA in 2017.  He was admitted with COVID-pneumonia as well as suspicion for aspiration pneumonia and started on antivirals as well as multiple antibiotics.  Daughter understands that patient's clinical condition is deteriorating and that his prognosis appears to be hours to days.  She is agreeable to comfort care and palliative has been consulted.  Residential hospice to evaluate 11/9.  Assessment & Plan:   Principal Problem:   Septic shock ([place]) Active Problems:   Dysphagia   Aspiration pneumonia ([facility])   Lactic acidosis   COVID-19 virus infection   Hyperglycemia   AKI (acute kidney injury) ([place])   History of stroke  Assessment and Plan:   Septic shock, present on admission secondary to presumed aspiration pneumonia   COVID-19 viral pneumonia   Chronic dysphagia in the setting of prior CVA and dementia   Type 2 diabetes with hyperglycemia   AKI   History of CVA and residual dysphagia   COPD   Obesity   Patient is now comfort care due to poor prognosis.  Palliative care consulted.  Anticipate disposition to residential hospice versus home hospice after further discussion.  Further residential hospice evaluation to take place 11/9.     DVT prophylaxis: None Code Status: Full Family Communication: Discussed with daughter  on phone 11/5 Disposition Plan:  Status is: Inpatient Remains inpatient appropriate because: Need for IV medications.   Consultants:  Palliative   Procedures:  None   Antimicrobials:  Anti-infectives (From admission, onward)    Start     Dose/Rate Route Frequency Ordered Stop   10/14/22 1000  remdesivir 100 mg in sodium chloride 0.9 % 100 mL IVPB  Status:  Discontinued       See Hyperspace for full Linked Orders Report.   100 mg 200 mL/hr over 30 Minutes Intravenous Daily 10/13/22 0540 10/13/22 0541   10/14/22 1000  remdesivir 100 mg in sodium chloride 0.9 % 100 mL IVPB  Status:  Discontinued       See Hyperspace for full Linked Orders Report.   100 mg 200 mL/hr over 30 Minutes Intravenous Daily 10/13/22 0542 10/13/22 1146   10/13/22 0800  Ampicillin-Sulbactam (UNASYN) 3 g in sodium chloride 0.9 % 100 mL IVPB  Status:  Discontinued        3 g 200 mL/hr over 30 Minutes Intravenous Every 6 hours 10/13/22 0738 10/13/22 1146   10/13/22 0657  remdesivir 100 mg in sodium chloride 0.9 % 100 mL IVPB       See Hyperspace for full Linked Orders Report.   100 mg 200 mL/hr over 30 Minutes Intravenous  Once 10/13/22 0542 10/13/22 1047   10/13/22 0630  remdesivir 100 mg in sodium chloride 0.9 % 100 mL IVPB       See Hyperspace for full Linked Orders Report.   100 mg 200 mL/hr  over 30 Minutes Intravenous  Once 10/13/22 0542 10/13/22 1013   10/13/22 0615  remdesivir 200 mg in sodium chloride 0.9% 250 mL IVPB  Status:  Discontinued       See Hyperspace for full Linked Orders Report.   200 mg 580 mL/hr over 30 Minutes Intravenous Once 10/13/22 0540 10/13/22 0541   10/13/22 0600  azithromycin (ZITHROMAX) 500 mg in sodium chloride 0.9 % 250 mL IVPB  Status:  Discontinued        500 mg 250 mL/hr over 60 Minutes Intravenous Every 24 hours 10/13/22 0512 10/13/22 1146   10/13/22 0600  Ampicillin-Sulbactam (UNASYN) 3 g in sodium chloride 0.9 % 100 mL IVPB  Status:  Discontinued        3 g 200 mL/hr  over 30 Minutes Intravenous Every 6 hours 10/13/22 0512 10/13/22 0738   10/12/22 2215  Ampicillin-Sulbactam (UNASYN) 3 g in sodium chloride 0.9 % 100 mL IVPB        3 g 200 mL/hr over 30 Minutes Intravenous  Once 10/12/22 2207 10/12/22 2336      Subjective: Patient seen and evaluated today with no new acute complaints or concerns. No acute concerns or events noted overnight.  Objective: Vitals:   10/15/22 0538 10/15/22 0904 10/15/22 1000 10/15/22 1108  BP: (!) 147/58   (!) 133/52  Pulse: 71   66  Resp: 20 (!) 24 16   Temp: (!) 97.2 F (36.2 C)   99.2 F (37.3 C)  TempSrc:    Oral  SpO2: 92%   95%  Weight:      Height:        Intake/Output Summary (Last 24 hours) at 10/15/2022 1327 Last data filed at 10/15/2022 0900 Gross per 24 hour  Intake 340 ml  Output 1150 ml  Net -810 ml   Filed Weights   10/12/22 2157 10/13/22 0430  Weight: 107 kg 114.4 kg    Examination:  General exam: Appears calm and comfortable, unresponsive to questioning Respiratory system: Clear to auscultation. Respiratory effort normal. Cardiovascular system: S1 & S2 heard, RRR.  Gastrointestinal system: Abdomen is soft Central nervous system: Alert and awake Extremities: No edema Skin: No significant lesions noted Psychiatry: Flat affect.    Data Reviewed: I have personally reviewed following labs and imaging studies  CBC: Recent Labs  Lab 10/12/22 2153 10/13/22 0632  WBC 12.7* 17.5*  NEUTROABS 10.6* 14.8*  HGB 14.4 13.3  HCT 44.9 42.5  MCV 90.7 92.8  PLT 171 294*   Basic Metabolic Panel: Recent Labs  Lab 10/12/22 2153 10/13/22 0632  NA 135 139  K 4.7 5.0  CL 100 105  CO2 27 26  GLUCOSE 179* 139*  BUN 28* 25*  CREATININE 1.29* 0.98  CALCIUM 8.7* 8.3*  MG  --  1.8  PHOS  --  4.6   GFR: Estimated Creatinine Clearance: 74.4 mL/min (by C-G formula based on SCr of 0.98 mg/dL). Liver Function Tests: Recent Labs  Lab 10/12/22 2153 10/13/22 0632  AST 20 22  ALT 15 16   ALKPHOS 74 67  BILITOT 1.3* 0.9  PROT 7.5 7.3  ALBUMIN 3.8 3.7   No results for input(s): "LIPASE", "AMYLASE" in the last 168 hours. No results for input(s): "AMMONIA" in the last 168 hours. Coagulation Profile: Recent Labs  Lab 10/12/22 2153  INR 1.0   Cardiac Enzymes: No results for input(s): "CKTOTAL", "CKMB", "CKMBINDEX", "TROPONINI" in the last 168 hours. BNP (last 3 results) No results for input(s): "PROBNP" in the last  8760 hours. HbA1C: No results for input(s): "HGBA1C" in the last 72 hours. CBG: Recent Labs  Lab 10/13/22 0919 10/13/22 1118  GLUCAP 128* 118*   Lipid Profile: No results for input(s): "CHOL", "HDL", "LDLCALC", "TRIG", "CHOLHDL", "LDLDIRECT" in the last 72 hours. Thyroid Function Tests: No results for input(s): "TSH", "T4TOTAL", "FREET4", "T3FREE", "THYROIDAB" in the last 72 hours. Anemia Panel: Recent Labs    10/13/22 0632  FERRITIN 193   Sepsis Labs: Recent Labs  Lab 10/12/22 2153 10/13/22 0020 10/13/22 0536  PROCALCITON  --   --  0.41  LATICACIDVEN 2.4* 1.7  --     Recent Results (from the past 240 hour(s))  Blood Culture (routine x 2)     Status: None (Preliminary result)   Collection Time: 10/12/22 10:13 PM   Specimen: BLOOD LEFT HAND  Result Value Ref Range Status   Specimen Description BLOOD LEFT HAND  Final   Special Requests   Final    BOTTLES DRAWN AEROBIC AND ANAEROBIC Blood Culture adequate volume   Culture   Final    NO GROWTH 3 DAYS Performed at [facility], [address], [place], [place] [address]    Report Status PENDING  Incomplete  Blood Culture (routine x 2)     Status: None (Preliminary result)   Collection Time: 10/12/22 10:16 PM   Specimen: Right Antecubital; Blood  Result Value Ref Range Status   Specimen Description RIGHT ANTECUBITAL  Final   Special Requests   Final    BOTTLES DRAWN AEROBIC AND ANAEROBIC Blood Culture results may not be optimal due to an excessive volume of blood received in culture  bottles   Culture   Final    NO GROWTH 3 DAYS Performed at [facility], [address], [place], [place] [address]    Report Status PENDING  Incomplete  Resp Panel by RT-PCR (Flu A&B, Covid) Anterior Nasal Swab     Status: Abnormal   Collection Time: 10/12/22 10:25 PM   Specimen: Anterior Nasal Swab  Result Value Ref Range Status   SARS Coronavirus 2 by RT PCR POSITIVE (A) NEGATIVE Final    Comment: (NOTE) SARS-CoV-2 target nucleic acids are DETECTED.  The SARS-CoV-2 RNA is generally detectable in upper respiratory specimens during the acute phase of infection. Positive results are indicative of the presence of the identified virus, but do not rule out bacterial infection or co-infection with other pathogens not detected by the test. Clinical correlation with patient history and other diagnostic information is necessary to determine patient infection status. The expected result is Negative.  Fact Sheet for Patients: EntrepreneurPulse.com.au  Fact Sheet for Healthcare Providers: IncredibleEmployment.be  This test is not yet approved or cleared by the [place] FDA and  has been authorized for detection and/or diagnosis of SARS-CoV-2 by FDA under an Emergency Use Authorization (EUA).  This EUA will remain in effect (meaning this test can be used) for the duration of  the COVID-19 declaration under Section 564(b)(1) of the A ct, 21 U.S.C. section 360bbb-3(b)(1), unless the authorization is terminated or revoked sooner.     Influenza A by PCR NEGATIVE NEGATIVE Final   Influenza B by PCR NEGATIVE NEGATIVE Final    Comment: (NOTE) The Xpert Xpress SARS-CoV-2/FLU/RSV plus assay is intended as an aid in the diagnosis of influenza from Nasopharyngeal swab specimens and should not be used as a sole basis for treatment. Nasal washings and aspirates are unacceptable for Xpert Xpress SARS-CoV-2/FLU/RSV testing.  Fact Sheet for  Patients: EntrepreneurPulse.com.au  Fact Sheet for Healthcare Providers: IncredibleEmployment.be  This test is not yet approved or cleared by the [place] FDA and has been authorized for detection and/or diagnosis of SARS-CoV-2 by FDA under an Emergency Use Authorization (EUA). This EUA will remain in effect (meaning this test can be used) for the duration of the COVID-19 declaration under Section 564(b)(1) of the Act, 21 U.S.C. section 360bbb-3(b)(1), unless the authorization is terminated or revoked.  Performed at [facility], [address], [place], [place] [address]   Urine Culture     Status: None   Collection Time: 10/13/22  2:33 AM   Specimen: In/Out Cath Urine  Result Value Ref Range Status   Specimen Description   Final    IN/OUT CATH URINE Performed at [facility], [address], [place], [place] [address]    Special Requests   Final    NONE Performed at [facility], [address], [place], [place] [address]    Culture   Final    NO GROWTH Performed at [facility], [place] [address], [place], [place] [address]    Report Status 10/14/2022 FINAL  Final  MRSA Next Gen by PCR, Nasal     Status: None   Collection Time: 10/13/22  4:59 AM   Specimen: Nasal Mucosa; Nasal Swab  Result Value Ref Range Status   MRSA by PCR Next Gen NOT DETECTED NOT DETECTED Final    Comment: (NOTE) The GeneXpert MRSA Assay (FDA approved for NASAL specimens only), is one component of a comprehensive MRSA colonization surveillance program. It is not intended to diagnose MRSA infection nor to guide or monitor treatment for MRSA infections. Test performance is not FDA approved in patients less than 30 years old. Performed at [facility], [address], [place],  [address]          Radiology Studies: No results found.    LOS: 2 days    Time spent: 35 minutes    Ketura Sirek Darleen Crocker, DO [facility]  If 7PM-7AM, please  contact night-coverage [email or web address] 10/15/2022, 1:27 PM

## 2022-10-16 DIAGNOSIS — J690 Pneumonitis due to inhalation of food and vomit: Secondary | ICD-10-CM | POA: Diagnosis not present

## 2022-10-16 DIAGNOSIS — Z515 Encounter for palliative care: Secondary | ICD-10-CM | POA: Diagnosis not present

## 2022-10-16 DIAGNOSIS — U071 COVID-19: Secondary | ICD-10-CM | POA: Diagnosis not present

## 2022-10-16 DIAGNOSIS — R6521 Severe sepsis with septic shock: Secondary | ICD-10-CM | POA: Diagnosis not present

## 2022-10-16 DIAGNOSIS — Z7189 Other specified counseling: Secondary | ICD-10-CM

## 2022-10-16 DIAGNOSIS — A419 Sepsis, unspecified organism: Secondary | ICD-10-CM | POA: Diagnosis not present

## 2022-10-16 MED ORDER — MORPHINE SULFATE (CONCENTRATE) 10 MG/0.5ML PO SOLN
2.6000 mg | ORAL | Status: DC | PRN
  Administered 2022-10-16: 5 mg via ORAL
  Administered 2022-10-16: 2.6 mg via ORAL
  Administered 2022-10-16: 5 mg via ORAL
  Filled 2022-10-16 (×3): qty 0.5

## 2022-10-16 MED ORDER — ALBUTEROL SULFATE (2.5 MG/3ML) 0.083% IN NEBU
2.5000 mg | INHALATION_SOLUTION | Freq: Four times a day (QID) | RESPIRATORY_TRACT | Status: DC | PRN
  Administered 2022-10-16 (×2): 2.5 mg via RESPIRATORY_TRACT
  Filled 2022-10-16 (×2): qty 3

## 2022-10-16 NOTE — TOC Progression Note (Signed)
Transition of Care [facility]) - Progression Note    Patient Details  Name: Tyler Mejia MRN: [id number] Date of Birth: [birth year]  Transition of Care [facility] CM/SW Contact  Salome Arnt, [place] Phone Number: 10/16/2022, 1:18 PM  Clinical Narrative:  LCSW confirmed with Marchia Meiers at Hospice plan to assess pt tomorrow for residential hospice. Unsure at this point what time hospice will arrive. TOC will continue to follow.        Barriers to Discharge: Continued Medical Work up  Expected Discharge Plan and Services                                                 Social Determinants of Health (SDOH) Interventions    Readmission Risk Interventions     No data to display

## 2022-10-16 NOTE — Progress Notes (Addendum)
Stated name and birthday but slow to respond.  Wheezing and rhonchi with wet sounding breathing at 28 bpm and 94% on 3 liters.  Drank about 500 mls with straw and ate about 40% of meal with no choking.  Gave 1 mg robinul po and contacted RT to get order for neb since patient could not do inhaler for me. Mouth care performed.  Vitals stable except for respirations.

## 2022-10-16 NOTE — Progress Notes (Signed)
Palliative: Tyler Mejia is resting quietly in bed.  He opens his eyes when I near his bedside.  He has known dementia, therefore I do not ask orientation questions.  He is alert, and I do believe that he can make his basic needs known.  There is no family present at bedside at this time. We talk about his breathing.  He attempts to tell me that he has COPD, but is unable.  He does answer affirmatively when I ask if this is what he is trying to share.  Overall, he voices no distress but does have an increased respiratory rate.  Daughter, Tyler Mejia, is present in the waiting room.  We talk about Tyler Mejia declines in mental and physical function over the last few months in particular.  She states that family is ready for hospice care.  Overall, she is knowledgeable about her father's health history and the plan.  She states that family has spoken with hospice of [facility] and understands that they will come and evaluate him for residential hospice placement tomorrow.  Daughter shares that her sister, Tyler Mejia, is healthcare surrogate.  Conference with bedside nursing staff and transition of care team related to patient condition, needs, goals of care.  Plan: Requesting comfort and dignity at end-of-life, residential hospice at [place], [place].  Symptom management needs addressed.  DNR form is on chart.  57 minutes [name], NP Palliative medicine team Team phone [phone] Greater than 50% of this time was spent counseling and coordinating care related to the above assessment and plan.

## 2022-10-16 NOTE — Progress Notes (Signed)
PROGRESS NOTE    DEMETRE MONACO  XTG:[id number] DOB: December 29, 1935 DOA: 10/12/2022 PCP: [name], MD   Brief Narrative:    Tyler Mejia is a 86 y.o. male with medical history significant of COPD, BPH, CAD s/p CABG x4, NSTEMI (2013), history of stroke (January 2017) with residual difficulty in swallowing, depression who presents to the emergency department via EMS due to shortness of breath which was thought to be due to aspiration.  He presented from his facility with worsening hypoxemia and gurgling.  Daughter states that he has been deteriorating over the last several months from his dementia and was noted to have residual difficulty with dysphagia after CVA in 2017.  He was admitted with COVID-pneumonia as well as suspicion for aspiration pneumonia and started on antivirals as well as multiple antibiotics.  Daughter understands that patient's clinical condition is deteriorating and that his prognosis appears to be hours to days.  She is agreeable to comfort care and palliative has been consulted.  Residential hospice to evaluate 11/9.  Assessment & Plan:   Principal Problem:   Septic shock ([place]) Active Problems:   Dysphagia   Aspiration pneumonia (HCC)   Lactic acidosis   COVID-19 virus infection   Hyperglycemia   AKI (acute kidney injury) ([place])   History of stroke  Assessment and Plan:   Septic shock, present on admission secondary to presumed aspiration pneumonia   COVID-19 viral pneumonia   Chronic dysphagia in the setting of prior CVA and dementia   Type 2 diabetes with hyperglycemia   AKI   History of CVA and residual dysphagia   COPD   Obesity   Patient is now comfort care due to poor prognosis.  Palliative care consulted.  Anticipate disposition to residential hospice versus home hospice after further discussion.  Further residential hospice evaluation to take place 11/9.     DVT prophylaxis: full comfort measures Code Status: DNR  Family Communication:   Disposition Plan: TBD Status is: Inpatient Remains inpatient appropriate because: Need for IV medications.   Consultants:  Palliative   Procedures:  None   Antimicrobials:  Anti-infectives (From admission, onward)    Start     Dose/Rate Route Frequency Ordered Stop   10/14/22 1000  remdesivir 100 mg in sodium chloride 0.9 % 100 mL IVPB  Status:  Discontinued       See Hyperspace for full Linked Orders Report.   100 mg 200 mL/hr over 30 Minutes Intravenous Daily 10/13/22 0540 10/13/22 0541   10/14/22 1000  remdesivir 100 mg in sodium chloride 0.9 % 100 mL IVPB  Status:  Discontinued       See Hyperspace for full Linked Orders Report.   100 mg 200 mL/hr over 30 Minutes Intravenous Daily 10/13/22 0542 10/13/22 1146   10/13/22 0800  Ampicillin-Sulbactam (UNASYN) 3 g in sodium chloride 0.9 % 100 mL IVPB  Status:  Discontinued        3 g 200 mL/hr over 30 Minutes Intravenous Every 6 hours 10/13/22 0738 10/13/22 1146   10/13/22 0657  remdesivir 100 mg in sodium chloride 0.9 % 100 mL IVPB       See Hyperspace for full Linked Orders Report.   100 mg 200 mL/hr over 30 Minutes Intravenous  Once 10/13/22 0542 10/13/22 1047   10/13/22 0630  remdesivir 100 mg in sodium chloride 0.9 % 100 mL IVPB       See Hyperspace for full Linked Orders Report.   100 mg 200 mL/hr over 30  Minutes Intravenous  Once 10/13/22 0542 10/13/22 1013   10/13/22 0615  remdesivir 200 mg in sodium chloride 0.9% 250 mL IVPB  Status:  Discontinued       See Hyperspace for full Linked Orders Report.   200 mg 580 mL/hr over 30 Minutes Intravenous Once 10/13/22 0540 10/13/22 0541   10/13/22 0600  azithromycin (ZITHROMAX) 500 mg in sodium chloride 0.9 % 250 mL IVPB  Status:  Discontinued        500 mg 250 mL/hr over 60 Minutes Intravenous Every 24 hours 10/13/22 0512 10/13/22 1146   10/13/22 0600  Ampicillin-Sulbactam (UNASYN) 3 g in sodium chloride 0.9 % 100 mL IVPB  Status:  Discontinued        3 g 200 mL/hr over 30  Minutes Intravenous Every 6 hours 10/13/22 0512 10/13/22 0738   10/12/22 2215  Ampicillin-Sulbactam (UNASYN) 3 g in sodium chloride 0.9 % 100 mL IVPB        3 g 200 mL/hr over 30 Minutes Intravenous  Once 10/12/22 2207 10/12/22 2336      Subjective: No specific complaints today.   Objective: Vitals:   10/15/22 1359 10/16/22 0500 10/16/22 0831 10/16/22 0908  BP: (!) 123/51 (!) 164/62 (!) 154/70   Pulse: 71 65 64   Resp:  20 (!) 28   Temp: 98.7 F (37.1 C) 97.6 F (36.4 C) 98.3 F (36.8 C)   TempSrc: Oral  Oral   SpO2: 96% 98% 94% 95%  Weight:      Height:        Intake/Output Summary (Last 24 hours) at 10/16/2022 1147 Last data filed at 10/16/2022 0606 Gross per 24 hour  Intake 358 ml  Output 250 ml  Net 108 ml   Filed Weights   10/12/22 2157 10/13/22 0430  Weight: 107 kg 114.4 kg    Examination:  General exam: Appears calm and comfortable, unresponsive to questioning Respiratory system: rales heard bilaterally.  Loud ant noises heard.  Cardiovascular system: S1 & S2 heard, RRR.  Gastrointestinal system: Abdomen is soft Central nervous system: somnolent.  Extremities: No edema Skin: No significant lesions noted Psychiatry: Flat affect.  Data Reviewed: I have personally reviewed following labs and imaging studies  CBC: Recent Labs  Lab 10/12/22 2153 10/13/22 0632  WBC 12.7* 17.5*  NEUTROABS 10.6* 14.8*  HGB 14.4 13.3  HCT 44.9 42.5  MCV 90.7 92.8  PLT 171 572*   Basic Metabolic Panel: Recent Labs  Lab 10/12/22 2153 10/13/22 0632  NA 135 139  K 4.7 5.0  CL 100 105  CO2 27 26  GLUCOSE 179* 139*  BUN 28* 25*  CREATININE 1.29* 0.98  CALCIUM 8.7* 8.3*  MG  --  1.8  PHOS  --  4.6   GFR: Estimated Creatinine Clearance: 74.4 mL/min (by C-G formula based on SCr of 0.98 mg/dL). Liver Function Tests: Recent Labs  Lab 10/12/22 2153 10/13/22 0632  AST 20 22  ALT 15 16  ALKPHOS 74 67  BILITOT 1.3* 0.9  PROT 7.5 7.3  ALBUMIN 3.8 3.7   No  results for input(s): "LIPASE", "AMYLASE" in the last 168 hours. No results for input(s): "AMMONIA" in the last 168 hours. Coagulation Profile: Recent Labs  Lab 10/12/22 2153  INR 1.0   Cardiac Enzymes: No results for input(s): "CKTOTAL", "CKMB", "CKMBINDEX", "TROPONINI" in the last 168 hours. BNP (last 3 results) No results for input(s): "PROBNP" in the last 8760 hours. HbA1C: No results for input(s): "HGBA1C" in the last 72 hours.  CBG: Recent Labs  Lab 10/13/22 0919 10/13/22 1118  GLUCAP 128* 118*   Lipid Profile: No results for input(s): "CHOL", "HDL", "LDLCALC", "TRIG", "CHOLHDL", "LDLDIRECT" in the last 72 hours. Thyroid Function Tests: No results for input(s): "TSH", "T4TOTAL", "FREET4", "T3FREE", "THYROIDAB" in the last 72 hours. Anemia Panel: No results for input(s): "VITAMINB12", "FOLATE", "FERRITIN", "TIBC", "IRON", "RETICCTPCT" in the last 72 hours.  Sepsis Labs: Recent Labs  Lab 10/12/22 2153 10/13/22 0020 10/13/22 0536  PROCALCITON  --   --  0.41  LATICACIDVEN 2.4* 1.7  --     Recent Results (from the past 240 hour(s))  Blood Culture (routine x 2)     Status: None (Preliminary result)   Collection Time: 10/12/22 10:13 PM   Specimen: BLOOD LEFT HAND  Result Value Ref Range Status   Specimen Description BLOOD LEFT HAND  Final   Special Requests   Final    BOTTLES DRAWN AEROBIC AND ANAEROBIC Blood Culture adequate volume   Culture   Final    NO GROWTH 4 DAYS Performed at [facility], [address], [place], [place] [address]    Report Status PENDING  Incomplete  Blood Culture (routine x 2)     Status: None (Preliminary result)   Collection Time: 10/12/22 10:16 PM   Specimen: Right Antecubital; Blood  Result Value Ref Range Status   Specimen Description RIGHT ANTECUBITAL  Final   Special Requests   Final    BOTTLES DRAWN AEROBIC AND ANAEROBIC Blood Culture results may not be optimal due to an excessive volume of blood received in culture bottles    Culture   Final    NO GROWTH 4 DAYS Performed at [facility], [address] [place], [place] [address]    Report Status PENDING  Incomplete  Resp Panel by RT-PCR (Flu A&B, Covid) Anterior Nasal Swab     Status: Abnormal   Collection Time: 10/12/22 10:25 PM   Specimen: Anterior Nasal Swab  Result Value Ref Range Status   SARS Coronavirus 2 by RT PCR POSITIVE (A) NEGATIVE Final    Comment: (NOTE) SARS-CoV-2 target nucleic acids are DETECTED.  The SARS-CoV-2 RNA is generally detectable in upper respiratory specimens during the acute phase of infection. Positive results are indicative of the presence of the identified virus, but do not rule out bacterial infection or co-infection with other pathogens not detected by the test. Clinical correlation with patient history and other diagnostic information is necessary to determine patient infection status. The expected result is Negative.  Fact Sheet for Patients: EntrepreneurPulse.com.au  Fact Sheet for Healthcare Providers: IncredibleEmployment.be  This test is not yet approved or cleared by the [place] FDA and  has been authorized for detection and/or diagnosis of SARS-CoV-2 by FDA under an Emergency Use Authorization (EUA).  This EUA will remain in effect (meaning this test can be used) for the duration of  the COVID-19 declaration under Section 564(b)(1) of the A ct, 21 U.S.C. section 360bbb-3(b)(1), unless the authorization is terminated or revoked sooner.     Influenza A by PCR NEGATIVE NEGATIVE Final   Influenza B by PCR NEGATIVE NEGATIVE Final    Comment: (NOTE) The Xpert Xpress SARS-CoV-2/FLU/RSV plus assay is intended as an aid in the diagnosis of influenza from Nasopharyngeal swab specimens and should not be used as a sole basis for treatment. Nasal washings and aspirates are unacceptable for Xpert Xpress SARS-CoV-2/FLU/RSV testing.  Fact Sheet for  Patients: EntrepreneurPulse.com.au  Fact Sheet for Healthcare Providers: IncredibleEmployment.be  This test  is not yet approved or cleared by the [place] and has been authorized for detection and/or diagnosis of SARS-CoV-2 by FDA under an Emergency Use Authorization (EUA). This EUA will remain in effect (meaning this test can be used) for the duration of the COVID-19 declaration under Section 564(b)(1) of the Act, 21 U.S.C. section 360bbb-3(b)(1), unless the authorization is terminated or revoked.  Performed at [facility], [address] [place] [address], [place] [address]   Urine Culture     Status: None   Collection Time: 10/13/22  2:33 AM   Specimen: In/Out Cath Urine  Result Value Ref Range Status   Specimen Description   Final    IN/OUT CATH URINE Performed at [facility], [address] [place], [place] [address]    Special Requests   Final    NONE Performed at [facility], [address], [place], [place] [address]    Culture   Final    NO GROWTH Performed at [facility], [place] [address], [place], [place] [address]    Report Status 10/14/2022 FINAL  Final  MRSA Next Gen by PCR, Nasal     Status: None   Collection Time: 10/13/22  4:59 AM   Specimen: Nasal Mucosa; Nasal Swab  Result Value Ref Range Status   MRSA by PCR Next Gen NOT DETECTED NOT DETECTED Final    Comment: (NOTE) The GeneXpert MRSA Assay (FDA approved for NASAL specimens only), is one component of a comprehensive MRSA colonization surveillance program. It is not intended to diagnose MRSA infection nor to guide or monitor treatment for MRSA infections. Test performance is not FDA approved in patients less than 20 years old. Performed at [facility], [address], [place], [place] [address]      Radiology Studies: No results found.    LOS: 3 days    Time spent: 35 minutes  Irwin Brakeman, MD [facility]  If 7PM-7AM, please contact  night-coverage [email or web address] 10/16/2022, 11:47 AM

## 2022-10-16 NOTE — Progress Notes (Signed)
Patient slept throughout this shift, he did wake and respond to voice. He denies pain or discomfort at this time.

## 2022-10-16 NOTE — Progress Notes (Signed)
Gave morphine 2.6 po and robinul for breathing.  Continues to have audible wheezing .

## 2022-10-17 DIAGNOSIS — Z7189 Other specified counseling: Secondary | ICD-10-CM

## 2022-10-17 DIAGNOSIS — U071 COVID-19: Secondary | ICD-10-CM | POA: Diagnosis not present

## 2022-10-17 DIAGNOSIS — J690 Pneumonitis due to inhalation of food and vomit: Secondary | ICD-10-CM

## 2022-10-17 DIAGNOSIS — A419 Sepsis, unspecified organism: Secondary | ICD-10-CM | POA: Diagnosis not present

## 2022-10-17 DIAGNOSIS — R6521 Severe sepsis with septic shock: Secondary | ICD-10-CM | POA: Diagnosis not present

## 2022-10-17 DIAGNOSIS — Z515 Encounter for palliative care: Secondary | ICD-10-CM | POA: Diagnosis not present

## 2022-10-17 LAB — CULTURE, BLOOD (ROUTINE X 2)
Culture: NO GROWTH
Culture: NO GROWTH
Special Requests: ADEQUATE

## 2022-10-17 NOTE — Progress Notes (Signed)
PROGRESS NOTE    Tyler Mejia  DPO:[id number] DOB: 06-Feb-1936 DOA: 10/12/2022 PCP: [name], MD   Brief Narrative:    Tyler Mejia is a 86 y.o. male with medical history significant of COPD, BPH, CAD s/p CABG x4, NSTEMI (2013), history of stroke (January 2017) with residual difficulty in swallowing, depression who presents to the emergency department via EMS due to shortness of breath which was thought to be due to aspiration.  He presented from his facility with worsening hypoxemia and gurgling.  Daughter states that he has been deteriorating over the last several months from his dementia and was noted to have residual difficulty with dysphagia after CVA in 2017.  He was admitted with COVID-pneumonia as well as suspicion for aspiration pneumonia and started on antivirals as well as multiple antibiotics.  Daughter understands that patient's clinical condition is deteriorating and that his prognosis appears to be hours to days.  She is agreeable to comfort care and palliative has been consulted.  Residential hospice to evaluate 11/9.  Assessment & Plan:   Principal Problem:   Septic shock ([place]) Active Problems:   Dysphagia   Aspiration pneumonia (HCC)   Lactic acidosis   COVID-19 virus infection   Hyperglycemia   AKI (acute kidney injury) ([place])   History of stroke  Assessment and Plan:   Septic shock, present on admission secondary to presumed aspiration pneumonia   COVID-19 viral pneumonia   Chronic dysphagia in the setting of prior CVA and dementia   Type 2 diabetes with hyperglycemia   AKI   History of CVA and residual dysphagia   COPD   Obesity   Patient is now comfort care due to poor prognosis.  Palliative care consulted.  Anticipate disposition to residential hospice versus home hospice after further discussion.  Further residential hospice evaluation to take place 11/9.     DVT prophylaxis: full comfort measures Code Status: DNR  Family Communication:   Disposition Plan: TBD Status is: Inpatient Remains inpatient appropriate because: Need for IV medications.   Consultants:  Palliative   Procedures:  None   Antimicrobials:  Anti-infectives (From admission, onward)    Start     Dose/Rate Route Frequency Ordered Stop   10/14/22 1000  remdesivir 100 mg in sodium chloride 0.9 % 100 mL IVPB  Status:  Discontinued       See Hyperspace for full Linked Orders Report.   100 mg 200 mL/hr over 30 Minutes Intravenous Daily 10/13/22 0540 10/13/22 0541   10/14/22 1000  remdesivir 100 mg in sodium chloride 0.9 % 100 mL IVPB  Status:  Discontinued       See Hyperspace for full Linked Orders Report.   100 mg 200 mL/hr over 30 Minutes Intravenous Daily 10/13/22 0542 10/13/22 1146   10/13/22 0800  Ampicillin-Sulbactam (UNASYN) 3 g in sodium chloride 0.9 % 100 mL IVPB  Status:  Discontinued        3 g 200 mL/hr over 30 Minutes Intravenous Every 6 hours 10/13/22 0738 10/13/22 1146   10/13/22 0657  remdesivir 100 mg in sodium chloride 0.9 % 100 mL IVPB       See Hyperspace for full Linked Orders Report.   100 mg 200 mL/hr over 30 Minutes Intravenous  Once 10/13/22 0542 10/13/22 1047   10/13/22 0630  remdesivir 100 mg in sodium chloride 0.9 % 100 mL IVPB       See Hyperspace for full Linked Orders Report.   100 mg 200 mL/hr over 30  Minutes Intravenous  Once 10/13/22 0542 10/13/22 1013   10/13/22 0615  remdesivir 200 mg in sodium chloride 0.9% 250 mL IVPB  Status:  Discontinued       See Hyperspace for full Linked Orders Report.   200 mg 580 mL/hr over 30 Minutes Intravenous Once 10/13/22 0540 10/13/22 0541   10/13/22 0600  azithromycin (ZITHROMAX) 500 mg in sodium chloride 0.9 % 250 mL IVPB  Status:  Discontinued        500 mg 250 mL/hr over 60 Minutes Intravenous Every 24 hours 10/13/22 0512 10/13/22 1146   10/13/22 0600  Ampicillin-Sulbactam (UNASYN) 3 g in sodium chloride 0.9 % 100 mL IVPB  Status:  Discontinued        3 g 200 mL/hr over 30  Minutes Intravenous Every 6 hours 10/13/22 0512 10/13/22 0738   10/12/22 2215  Ampicillin-Sulbactam (UNASYN) 3 g in sodium chloride 0.9 % 100 mL IVPB        3 g 200 mL/hr over 30 Minutes Intravenous  Once 10/12/22 2207 10/12/22 2336      Subjective: He appears comfortable.  He has loud breathing sounds.  Objective: Vitals:   10/16/22 0831 10/16/22 0908 10/16/22 1342 10/17/22 0420  BP: (!) 154/70   (!) 154/74  Pulse: 64   64  Resp: (!) 28   14  Temp: 98.3 F (36.8 C)   97.6 F (36.4 C)  TempSrc: Oral     SpO2: 94% 95% 95% 93%  Weight:      Height:        Intake/Output Summary (Last 24 hours) at 10/17/2022 1107 Last data filed at 10/16/2022 1700 Gross per 24 hour  Intake 240 ml  Output 400 ml  Net -160 ml   Filed Weights   10/12/22 2157 10/13/22 0430  Weight: 107 kg 114.4 kg    Examination:  General exam: Appears calm and comfortable, unresponsive to questioning Respiratory system: rales heard bilaterally.  Loud ant noises heard.  Cardiovascular system: S1 & S2 heard, RRR.  Gastrointestinal system: Abdomen is soft Central nervous system: somnolent.  Extremities: No edema Skin: No significant lesions noted Psychiatry: Flat affect.  Data Reviewed: I have personally reviewed following labs and imaging studies  CBC: Recent Labs  Lab 10/12/22 2153 10/13/22 0632  WBC 12.7* 17.5*  NEUTROABS 10.6* 14.8*  HGB 14.4 13.3  HCT 44.9 42.5  MCV 90.7 92.8  PLT 171 725*   Basic Metabolic Panel: Recent Labs  Lab 10/12/22 2153 10/13/22 0632  NA 135 139  K 4.7 5.0  CL 100 105  CO2 27 26  GLUCOSE 179* 139*  BUN 28* 25*  CREATININE 1.29* 0.98  CALCIUM 8.7* 8.3*  MG  --  1.8  PHOS  --  4.6   GFR: Estimated Creatinine Clearance: 74.4 mL/min (by C-G formula based on SCr of 0.98 mg/dL). Liver Function Tests: Recent Labs  Lab 10/12/22 2153 10/13/22 0632  AST 20 22  ALT 15 16  ALKPHOS 74 67  BILITOT 1.3* 0.9  PROT 7.5 7.3  ALBUMIN 3.8 3.7   No results for  input(s): "LIPASE", "AMYLASE" in the last 168 hours. No results for input(s): "AMMONIA" in the last 168 hours. Coagulation Profile: Recent Labs  Lab 10/12/22 2153  INR 1.0   Cardiac Enzymes: No results for input(s): "CKTOTAL", "CKMB", "CKMBINDEX", "TROPONINI" in the last 168 hours. BNP (last 3 results) No results for input(s): "PROBNP" in the last 8760 hours. HbA1C: No results for input(s): "HGBA1C" in the last 72 hours.  CBG: Recent Labs  Lab 10/13/22 0919 10/13/22 1118  GLUCAP 128* 118*   Lipid Profile: No results for input(s): "CHOL", "HDL", "LDLCALC", "TRIG", "CHOLHDL", "LDLDIRECT" in the last 72 hours. Thyroid Function Tests: No results for input(s): "TSH", "T4TOTAL", "FREET4", "T3FREE", "THYROIDAB" in the last 72 hours. Anemia Panel: No results for input(s): "VITAMINB12", "FOLATE", "FERRITIN", "TIBC", "IRON", "RETICCTPCT" in the last 72 hours.  Sepsis Labs: Recent Labs  Lab 10/12/22 2153 10/13/22 0020 10/13/22 0536  PROCALCITON  --   --  0.41  LATICACIDVEN 2.4* 1.7  --     Recent Results (from the past 240 hour(s))  Blood Culture (routine x 2)     Status: None (Preliminary result)   Collection Time: 10/12/22 10:13 PM   Specimen: BLOOD LEFT HAND  Result Value Ref Range Status   Specimen Description BLOOD LEFT HAND  Final   Special Requests   Final    BOTTLES DRAWN AEROBIC AND ANAEROBIC Blood Culture adequate volume   Culture   Final    NO GROWTH 4 DAYS Performed at [facility], [address] [place], [place] [address]    Report Status PENDING  Incomplete  Blood Culture (routine x 2)     Status: None (Preliminary result)   Collection Time: 10/12/22 10:16 PM   Specimen: Right Antecubital; Blood  Result Value Ref Range Status   Specimen Description RIGHT ANTECUBITAL  Final   Special Requests   Final    BOTTLES DRAWN AEROBIC AND ANAEROBIC Blood Culture results may not be optimal due to an excessive volume of blood received in culture bottles   Culture    Final    NO GROWTH 4 DAYS Performed at [facility], [address], [place], [place] [address]    Report Status PENDING  Incomplete  Resp Panel by RT-PCR (Flu A&B, Covid) Anterior Nasal Swab     Status: Abnormal   Collection Time: 10/12/22 10:25 PM   Specimen: Anterior Nasal Swab  Result Value Ref Range Status   SARS Coronavirus 2 by RT PCR POSITIVE (A) NEGATIVE Final    Comment: (NOTE) SARS-CoV-2 target nucleic acids are DETECTED.  The SARS-CoV-2 RNA is generally detectable in upper respiratory specimens during the acute phase of infection. Positive results are indicative of the presence of the identified virus, but do not rule out bacterial infection or co-infection with other pathogens not detected by the test. Clinical correlation with patient history and other diagnostic information is necessary to determine patient infection status. The expected result is Negative.  Fact Sheet for Patients: EntrepreneurPulse.com.au  Fact Sheet for Healthcare Providers: IncredibleEmployment.be  This test is not yet approved or cleared by the [place] FDA and  has been authorized for detection and/or diagnosis of SARS-CoV-2 by FDA under an Emergency Use Authorization (EUA).  This EUA will remain in effect (meaning this test can be used) for the duration of  the COVID-19 declaration under Section 564(b)(1) of the A ct, 21 U.S.C. section 360bbb-3(b)(1), unless the authorization is terminated or revoked sooner.     Influenza A by PCR NEGATIVE NEGATIVE Final   Influenza B by PCR NEGATIVE NEGATIVE Final    Comment: (NOTE) The Xpert Xpress SARS-CoV-2/FLU/RSV plus assay is intended as an aid in the diagnosis of influenza from Nasopharyngeal swab specimens and should not be used as a sole basis for treatment. Nasal washings and aspirates are unacceptable for Xpert Xpress SARS-CoV-2/FLU/RSV testing.  Fact Sheet for  Patients: EntrepreneurPulse.com.au  Fact Sheet for Healthcare Providers: IncredibleEmployment.be  This test  is not yet approved or cleared by the [place] and has been authorized for detection and/or diagnosis of SARS-CoV-2 by FDA under an Emergency Use Authorization (EUA). This EUA will remain in effect (meaning this test can be used) for the duration of the COVID-19 declaration under Section 564(b)(1) of the Act, 21 U.S.C. section 360bbb-3(b)(1), unless the authorization is terminated or revoked.  Performed at [facility], [address] [place], [place] [address]   Urine Culture     Status: None   Collection Time: 10/13/22  2:33 AM   Specimen: In/Out Cath Urine  Result Value Ref Range Status   Specimen Description   Final    IN/OUT CATH URINE Performed at [facility], [address] [place], [place] [address]    Special Requests   Final    NONE Performed at [facility], [address], [place], [place] [address]    Culture   Final    NO GROWTH Performed at [facility], [place] [address] [place], [place] [address]    Report Status 10/14/2022 FINAL  Final  MRSA Next Gen by PCR, Nasal     Status: None   Collection Time: 10/13/22  4:59 AM   Specimen: Nasal Mucosa; Nasal Swab  Result Value Ref Range Status   MRSA by PCR Next Gen NOT DETECTED NOT DETECTED Final    Comment: (NOTE) The GeneXpert MRSA Assay (FDA approved for NASAL specimens only), is one component of a comprehensive MRSA colonization surveillance program. It is not intended to diagnose MRSA infection nor to guide or monitor treatment for MRSA infections. Test performance is not FDA approved in patients less than 39 years old. Performed at [facility], [address], [place], [place] [address]     Radiology Studies: No results found.   LOS: 4 days   Time spent: 35 minutes  Irwin Brakeman, MD [facility]  If 7PM-7AM, please contact  night-coverage [email or web address] 10/17/2022, 11:07 AM

## 2022-10-17 NOTE — Evaluation (Addendum)
Clinical/Bedside Swallow Evaluation Patient Details  Name: Tyler Mejia MRN: [id number] Date of Birth: Jan 19, 1936  Today's Date: 10/17/2022 Time: SLP Start Time (ACUTE ONLY): 1045 SLP Stop Time (ACUTE ONLY): 1114 SLP Time Calculation (min) (ACUTE ONLY): 29 min  Past Medical History:  Past Medical History:  Diagnosis Date   Alzheimer's dementia ([place])    Atrial fibrillation, transient ([place])    a. Post-op from CABG 08/2015.   CAD (coronary artery disease)    a. DES to the RCA March 2013 with residual dz rx medically. b. s/p CABGx4 in 08/2015.   Collagen vascular disease ([place])    COPD (chronic obstructive pulmonary disease) ([facility])    Dysphagia    Essential hypertension    GERD (gastroesophageal reflux disease)    History of stroke    January 2017   HLD (hyperlipidemia)    a. H/o multiple statin intolerances.   Hx of adenomatous colonic polyps    Hyperlipidemia    IBS (irritable bowel syndrome)    Nephrolithiasis    NSTEMI (non-ST elevated myocardial infarction) [facility]) March 2013   Obesity    Stroke [facility])    Past Surgical History:  Past Surgical History:  Procedure Laterality Date   APPENDECTOMY     BACK SURGERY     CARDIAC CATHETERIZATION N/A 08/09/2015   Procedure: Left Heart Cath and Coronary Angiography;  Surgeon: Jettie Booze, MD;  Location: [place] [facility];  Service: Cardiovascular;  Laterality: N/A;   COLONOSCOPY  08/21/2004   Pedunculated polyp at the splenic flexure   COLONOSCOPY  09/2009   Dr. Rourk--> Cecal and hepatic flexure polyps, tubular adenomas. Next colonoscopy October 2013   COLONOSCOPY N/A 05/07/2013   RMR: colonic polyp/colonic diverticulosis   CORONARY ARTERY BYPASS GRAFT N/A 08/11/2015   Procedure: CORONARY ARTERY BYPASS GRAFTING (CABG) X4 UTILIZING THE LEFT INTERNAL MAMMARY ARTERY TO LAD AND ENDOSCOPICALLY HARVESTED BILATERAL SAPHENEOUS VEINS TO DIAGONAL,OM1 AND OM2;  Surgeon: Ivin Poot, MD;  Location: [place];  Service: Open Heart Surgery;   Laterality: N/A;   ESOPHAGOGASTRODUODENOSCOPY  2003   distal erosion/small hiatal hernia   ESOPHAGOGASTRODUODENOSCOPY  09/2009   Dr. Evalee Mutton ring, mild distal esophageal erosions, status post Maloney dilation, small hiatal hernia.   ESOPHAGOGASTRODUODENOSCOPY N/A 03/16/2015   VQQ:VZDGL HH/focal area of distal esophageal s/p bx   LEFT HEART CATHETERIZATION WITH CORONARY ANGIOGRAM N/A 03/02/2012   Procedure: LEFT HEART CATHETERIZATION WITH CORONARY ANGIOGRAM;  Surgeon: Peter M Martinique, MD;  Location: [facility] CATH LAB;  Service: Cardiovascular;  Laterality: N/A;   PERCUTANEOUS CORONARY STENT INTERVENTION (PCI-S) N/A 03/02/2012   Procedure: PERCUTANEOUS CORONARY STENT INTERVENTION (PCI-S);  Surgeon: Peter M Martinique, MD;  Location: [facility] CATH LAB;  Service: Cardiovascular;  Laterality: N/A;   TEE WITHOUT CARDIOVERSION N/A 08/11/2015   Procedure: TRANSESOPHAGEAL ECHOCARDIOGRAM (TEE);  Surgeon: Ivin Poot, MD;  Location: [place];  Service: Open Heart Surgery;  Laterality: N/A;   HPI:  Tyler Mejia is a 86 y.o. male with medical history significant of COPD, BPH, CAD s/p CABG x4, NSTEMI (2013), history of stroke (January 2017) with residual difficulty in swallowing, depression who presents to the emergency department via EMS due to shortness of breath which was thought to be due to aspiration.  He presented from his facility with worsening hypoxemia and gurgling.  Daughter states that he has been deteriorating over the last several months from his dementia and was noted to have residual difficulty with dysphagia after CVA in 2017.  He was admitted with COVID-pneumonia  as well as suspicion for aspiration pneumonia and started on antivirals as well as multiple antibiotics.  Daughter understands that patient's clinical condition is deteriorating and that his prognosis appears to be hours to days.  She is agreeable to comfort care and palliative has been consulted.  Residential hospice to evaluate 11/9.     Assessment / Plan / Recommendation  Clinical Impression  Clinical swallowing evaluation completed while Pt was sitting upright in bed; Pt presents with wet respirations and a congested cough at baseline prior to trials. Note Pt was unable to cough or swallow on command. Vocal quality only audible once throughout evaluation and it was weak and wet sounding. Note Pt is on comfort care and residential hospice is to evaluate today (11/9), however MD has requested a BSE. Pt with dysphagia hx and MBS completed in 2017 with aspiration of thin liquids and recommendations for D3/mech soft and NTL. Pt demonstrated overt s/sx of aspiration with thin liquids and NTL including immedate and prolonged wet coughing, wet vocal quality and delayed coughing; further note question if Pt is not managing his secretions secondary to very wet vocal quality prior to trials. No overt s/sx of aspiration were noted with trials of puree and HTL. Pt did accept one bite of regular and demonstrated prolonged mastication -- HTL trials via tsp helped facilitate oral clearance. Defer diet recommednation to Hospice however,   SLP least restrictive diet recommendation (also most comfortable recommendation secondary to hard and overt coughing with thin/nectar trials) is D1/puree (or very soft textures) and HONEY THICK liquids. Since Pt is full comfort, our service will sign off at this time. Thank you for this consult,  SLP Visit Diagnosis: Dysphagia, unspecified (R13.10)    Aspiration Risk  Severe aspiration risk;Risk for inadequate nutrition/hydration    Diet Recommendation Dysphagia 1 (Puree);Honey-thick liquid   Liquid Administration via: Cup;Spoon Medication Administration: Crushed with puree Supervision: Patient able to self feed Compensations: Minimize environmental distractions;Slow rate;Small sips/bites Postural Changes: Seated upright at 90 degrees    Other  Recommendations Oral Care Recommendations: Oral care BID Other  Recommendations: Order thickener from pharmacy    Recommendations for follow up therapy are one component of a multi-disciplinary discharge planning process, led by the attending physician.  Recommendations may be updated based on patient status, additional functional criteria and insurance authorization.           Frequency and Duration min 1 x/week  1 week       Prognosis Prognosis for Safe Diet Advancement: Guarded Barriers to Reach Goals: Severity of deficits      Swallow Study   General Date of Onset: 10/12/22 HPI: Tyler Mejia is a 86 y.o. male with medical history significant of COPD, BPH, CAD s/p CABG x4, NSTEMI (2013), history of stroke (January 2017) with residual difficulty in swallowing, depression who presents to the emergency department via EMS due to shortness of breath which was thought to be due to aspiration.  He presented from his facility with worsening hypoxemia and gurgling.  Daughter states that he has been deteriorating over the last several months from his dementia and was noted to have residual difficulty with dysphagia after CVA in 2017.  He was admitted with COVID-pneumonia as well as suspicion for aspiration pneumonia and started on antivirals as well as multiple antibiotics.  Daughter understands that patient's clinical condition is deteriorating and that his prognosis appears to be hours to days.  She is agreeable to comfort care and palliative has been consulted.  Residential hospice to evaluate 11/9. Type of Study: Bedside Swallow Evaluation Previous Swallow Assessment: MBS 2017, BSE 2018 Diet Prior to this Study: Dysphagia 1 (puree);Nectar-thick liquids Temperature Spikes Noted: No Respiratory Status: Nasal cannula History of Recent Intubation: No Behavior/Cognition: Alert;Cooperative Oral Care Completed by SLP: Recent completion by staff Oral Cavity - Dentition: Adequate natural dentition Vision: Functional for self-feeding Self-Feeding Abilities:  Needs assist;Total assist Patient Positioning: Upright in bed Baseline Vocal Quality: Wet Volitional Cough: Cognitively unable to elicit;Strong;Wet;Congested Volitional Swallow: Unable to elicit    Oral/Motor/Sensory Function Overall Oral Motor/Sensory Function: Within functional limits   Ice Chips Ice chips: Within functional limits   Thin Liquid Thin Liquid: Impaired Presentation: Cup;Straw Oral Phase Impairments: Reduced labial seal;Poor awareness of bolus;Reduced lingual movement/coordination Oral Phase Functional Implications: Oral residue;Oral holding Pharyngeal  Phase Impairments: Multiple swallows;Wet Vocal Quality;Cough - Delayed;Throat Clearing - Delayed;Cough - Immediate;Throat Clearing - Immediate    Nectar Thick Nectar Thick Liquid: Impaired Presentation: Cup;Spoon Oral Phase Impairments: Reduced labial seal;Poor awareness of bolus;Reduced lingual movement/coordination Pharyngeal Phase Impairments: Wet Vocal Quality;Throat Clearing - Immediate;Throat Clearing - Delayed;Cough - Immediate;Cough - Delayed   Honey Thick     Puree Puree: Within functional limits   Solid     Solid: Impaired Presentation: Self Fed Oral Phase Impairments: Reduced lingual movement/coordination Oral Phase Functional Implications: Prolonged oral transit;Oral residue Pharyngeal Phase Impairments: Suspected delayed Swallow;Multiple swallows     Kaileah Shevchenko H. Roddie Mc, CCC-SLP Speech Language Pathologist  Wende Bushy 10/17/2022,11:14 AM

## 2022-10-17 NOTE — TOC Progression Note (Addendum)
Transition of Care [facility]) - Progression Note    Patient Details  Name: JERIMY JOHANSON MRN: [id number] Date of Birth: January 27, 1936  Transition of Care [facility]) CM/SW [place], [place] Phone Number: 10/17/2022, 9:41 AM  Clinical Narrative:    CSW reached out to [facility] with [facility] to confirm time for assessment today. Marchia Meiers states she is working to find and Therapist, sports that can complete assessment. If one cannot be found for today Marchia Meiers states they will complete assessment in the morning. CSW requested to be provided with updates. TOC to follow.   CSW left VM for Chas[place] with the [facility] requesting call back as well.   CSW followed up with Marchia Meiers at [facility]. They will have an RN out between 9:30 and 10 tomorrow to complete assessment.    Barriers to Discharge: Continued Medical Work up  Expected Discharge Plan and Services                                                 Social Determinants of Health (SDOH) Interventions    Readmission Risk Interventions     No data to display

## 2022-10-17 NOTE — Progress Notes (Addendum)
Pt lying in bed with eyes closed. Pt was able to be aroused by sternal rub. Pt sounds wet. Nursing staff assisted with ADL'S and offered nourishment to pt. Two of pt's daughters are in the family room and nursing staff updated them on the status of pt and that the hospice nurse may not come today because of staffing issues. NP N. Hulan Fray was able to speak with them and gave update and recommendations of the speech evaluation today and diet recommendations of DYS 1. Will continue to monitor

## 2022-10-17 NOTE — Progress Notes (Signed)
Palliative: Tyler Mejia remains is comfort measures only.  He has continued to experience declines over the last few days.  Family members are usually present throughout the day, although they spend the majority of the time in the waiting room due to [place] restrictions.   Family meeting with daughter Tyler Mejia and Tyler Mejia in the family waiting room.  We talk about residential hospice evaluation being delayed until tomorrow due to staffing issues with hospice.  We talk about Tyler Mejia declines over the last few days.  I shared that he must be stable enough to transport to hospice.  They state understanding.  We talk about symptom management, signs and symptoms that he would benefit from as needed medications.   Conference with attending, bedside nursing staff, speech therapy, transition of care team related to patient condition, needs, goals of care, disposition.  Plan: Comfort and dignity at end-of-life, requesting residential hospice placement at [place], [place]. DNR/[place]en[place] form is on chart.  28 minutes Tyler Axe, NP Palliative medicine team Team phone [phone] Greater than 50% of this time was spent counseling and coordinating care related to the above assessment and plan.

## 2022-10-18 DIAGNOSIS — U071 COVID-19: Secondary | ICD-10-CM | POA: Diagnosis not present

## 2022-10-18 DIAGNOSIS — J690 Pneumonitis due to inhalation of food and vomit: Secondary | ICD-10-CM | POA: Diagnosis not present

## 2022-10-18 DIAGNOSIS — A419 Sepsis, unspecified organism: Secondary | ICD-10-CM | POA: Diagnosis not present

## 2022-10-18 DIAGNOSIS — R6521 Severe sepsis with septic shock: Secondary | ICD-10-CM | POA: Diagnosis not present

## 2022-10-18 MED ORDER — MORPHINE SULFATE (CONCENTRATE) 10 MG/0.5ML PO SOLN
5.0000 mg | Freq: Four times a day (QID) | ORAL | Status: DC
  Administered 2022-10-18 – 2022-10-19 (×4): 5 mg via ORAL
  Filled 2022-10-18 (×4): qty 0.5

## 2022-10-18 MED ORDER — LORAZEPAM 2 MG/ML IJ SOLN
1.0000 mg | Freq: Three times a day (TID) | INTRAMUSCULAR | Status: DC
  Administered 2022-10-18 – 2022-10-21 (×9): 1 mg via INTRAVENOUS
  Filled 2022-10-18 (×9): qty 1

## 2022-10-18 NOTE — TOC Progression Note (Addendum)
Transition of Care [facility]) - Progression Note    Patient Details  Name: Tyler Mejia MRN: [id number] Date of Birth: October 11, 1936  Transition of Care [facility]) CM/SW [place], [place] Phone Number: 10/18/2022, 11:28 AM  Clinical Narrative:    CSW updated that Hospice has not been to assess pt as of yet. CSW reached out at 10:55 to Ileana Ladd at 10:57 and Vikki Ports at 30. CSW has not received a call back at this time. TOC to follow.   Addendum 11:30: CSW spoke with Marchia Meiers at [facility] who states the first assessment had something come up and RN is running late. CSW visited pt in family room to provide update. TOC to follow.     Barriers to Discharge: Continued Medical Work up  Expected Discharge Plan and Services                                                 Social Determinants of Health (SDOH) Interventions    Readmission Risk Interventions     No data to display

## 2022-10-18 NOTE — Progress Notes (Signed)
Family met with Hospice to discuss evaluation for placement. Pt has ate 75% of breakfast and approx. 50% of lunch, vitals are stable, pt has not needed any PRN medications today. Based on these findings, pt will need to be re-assessed on Monday for Hospice placement. Family is concerned and confused, MD made aware. States "we need to plan for placement somewhere, either way."

## 2022-10-18 NOTE — Progress Notes (Signed)
Patient slept throughout this shift, he is able to respond and make his needs known when asked. No complaints of pain or discomfort at this time.

## 2022-10-18 NOTE — Care Management Important Message (Signed)
Important Message  Patient Details  Name: Tyler Mejia MRN: [id number] Date of Birth: [birth year]   Medicare Important Message Given:  Other (see comment) Spoke wit daughter Donaciano Eva at [phone] to review letter, no additional copy needed    Tommy Medal 10/18/2022, 9:32 AM

## 2022-10-18 NOTE — Progress Notes (Signed)
PROGRESS NOTE    Tyler Mejia  OEV:[id number] DOB: 1935/12/20 DOA: 10/12/2022 PCP: [name], MD   Brief Narrative:    Tyler Mejia is a 86 y.o. male with medical history significant of COPD, BPH, CAD s/p CABG x4, NSTEMI (2013), history of stroke (January 2017) with residual difficulty in swallowing, depression who presents to the emergency department via EMS due to shortness of breath which was thought to be due to aspiration.  He presented from his facility with worsening hypoxemia and gurgling.  Daughter states that he has been deteriorating over the last several months from his dementia and was noted to have residual difficulty with dysphagia after CVA in 2017.  He was admitted with COVID-pneumonia as well as suspicion for aspiration pneumonia and started on antivirals as well as multiple antibiotics.  Daughter understands that patient's clinical condition is deteriorating and that his prognosis appears to be hours to days.  She is agreeable to comfort care and palliative has been consulted.  Residential hospice to evaluate 11/9.  Assessment & Plan:   Principal Problem:   Septic shock ([place]) Active Problems:   Dysphagia   Aspiration pneumonia ([facility])   Lactic acidosis   COVID-19 virus infection   Hyperglycemia   AKI (acute kidney injury) ([place])   History of stroke  Assessment and Plan:   Septic shock, present on admission secondary to presumed aspiration pneumonia   COVID-19 viral pneumonia   Chronic dysphagia in the setting of prior CVA and dementia   Type 2 diabetes with hyperglycemia   AKI   History of CVA and residual dysphagia   COPD   Obesity   Patient is now comfort care due to poor prognosis.  Palliative care consulted.  Anticipate disposition to residential hospice versus home hospice after further discussion.  [facility] came to assess today and felt patient not yet ready for residential hospice but would reassess patient on 11/13.  Daughter requested that  we schedule his medications as the prn meds are not being given by nursing.       DVT prophylaxis: full comfort measures Code Status: DNR  Family Communication: telephone call to daughter 11/10 Disposition Plan: TBD Status is: Inpatient Remains inpatient appropriate because: Need for IV medications.   Consultants:  Palliative   Procedures:  None   Antimicrobials:  Anti-infectives (From admission, onward)    Start     Dose/Rate Route Frequency Ordered Stop   10/14/22 1000  remdesivir 100 mg in sodium chloride 0.9 % 100 mL IVPB  Status:  Discontinued       See Hyperspace for full Linked Orders Report.   100 mg 200 mL/hr over 30 Minutes Intravenous Daily 10/13/22 0540 10/13/22 0541   10/14/22 1000  remdesivir 100 mg in sodium chloride 0.9 % 100 mL IVPB  Status:  Discontinued       See Hyperspace for full Linked Orders Report.   100 mg 200 mL/hr over 30 Minutes Intravenous Daily 10/13/22 0542 10/13/22 1146   10/13/22 0800  Ampicillin-Sulbactam (UNASYN) 3 g in sodium chloride 0.9 % 100 mL IVPB  Status:  Discontinued        3 g 200 mL/hr over 30 Minutes Intravenous Every 6 hours 10/13/22 0738 10/13/22 1146   10/13/22 0657  remdesivir 100 mg in sodium chloride 0.9 % 100 mL IVPB       See Hyperspace for full Linked Orders Report.   100 mg 200 mL/hr over 30 Minutes Intravenous  Once 10/13/22 0542 10/13/22  1047   10/13/22 0630  remdesivir 100 mg in sodium chloride 0.9 % 100 mL IVPB       See Hyperspace for full Linked Orders Report.   100 mg 200 mL/hr over 30 Minutes Intravenous  Once 10/13/22 0542 10/13/22 1013   10/13/22 0615  remdesivir 200 mg in sodium chloride 0.9% 250 mL IVPB  Status:  Discontinued       See Hyperspace for full Linked Orders Report.   200 mg 580 mL/hr over 30 Minutes Intravenous Once 10/13/22 0540 10/13/22 0541   10/13/22 0600  azithromycin (ZITHROMAX) 500 mg in sodium chloride 0.9 % 250 mL IVPB  Status:  Discontinued        500 mg 250 mL/hr over 60 Minutes  Intravenous Every 24 hours 10/13/22 0512 10/13/22 1146   10/13/22 0600  Ampicillin-Sulbactam (UNASYN) 3 g in sodium chloride 0.9 % 100 mL IVPB  Status:  Discontinued        3 g 200 mL/hr over 30 Minutes Intravenous Every 6 hours 10/13/22 0512 10/13/22 0738   10/12/22 2215  Ampicillin-Sulbactam (UNASYN) 3 g in sodium chloride 0.9 % 100 mL IVPB        3 g 200 mL/hr over 30 Minutes Intravenous  Once 10/12/22 2207 10/12/22 2336      Subjective: He appears comfortable.  He has loud breathing sounds.  Objective: Vitals:   10/17/22 0420 10/17/22 1415 10/17/22 2019 10/18/22 1300  BP: (!) 154/74 (!) 161/84 (!) 167/73 (!) 158/74  Pulse: 64 60 70 72  Resp: 14 (!) '24 20 20  '$ Temp: 97.6 F (36.4 C) 98.3 F (36.8 C) 98.6 F (37 C) 99.5 F (37.5 C)  TempSrc:  Oral  Oral  SpO2: 93% 93% 95% 93%  Weight:      Height:        Intake/Output Summary (Last 24 hours) at 10/18/2022 1556 Last data filed at 10/18/2022 0700 Gross per 24 hour  Intake 340 ml  Output 1300 ml  Net -960 ml   Filed Weights   10/12/22 2157 10/13/22 0430  Weight: 107 kg 114.4 kg    Examination:  General exam: Appears calm and comfortable, unresponsive to questioning Respiratory system: rales heard bilaterally.  Loud ant noises heard.  Cardiovascular system: S1 & S2 heard, RRR.  Gastrointestinal system: Abdomen is soft Central nervous system: somnolent.  Extremities: No edema Skin: No significant lesions noted Psychiatry: Flat affect.  Data Reviewed: I have personally reviewed following labs and imaging studies  CBC: Recent Labs  Lab 10/12/22 2153 10/13/22 0632  WBC 12.7* 17.5*  NEUTROABS 10.6* 14.8*  HGB 14.4 13.3  HCT 44.9 42.5  MCV 90.7 92.8  PLT 171 010*   Basic Metabolic Panel: Recent Labs  Lab 10/12/22 2153 10/13/22 0632  NA 135 139  K 4.7 5.0  CL 100 105  CO2 27 26  GLUCOSE 179* 139*  BUN 28* 25*  CREATININE 1.29* 0.98  CALCIUM 8.7* 8.3*  MG  --  1.8  PHOS  --  4.6   GFR: Estimated  Creatinine Clearance: 74.4 mL/min (by C-G formula based on SCr of 0.98 mg/dL). Liver Function Tests: Recent Labs  Lab 10/12/22 2153 10/13/22 0632  AST 20 22  ALT 15 16  ALKPHOS 74 67  BILITOT 1.3* 0.9  PROT 7.5 7.3  ALBUMIN 3.8 3.7   No results for input(s): "LIPASE", "AMYLASE" in the last 168 hours. No results for input(s): "AMMONIA" in the last 168 hours. Coagulation Profile: Recent Labs  Lab 10/12/22  2153  INR 1.0   Cardiac Enzymes: No results for input(s): "CKTOTAL", "CKMB", "CKMBINDEX", "TROPONINI" in the last 168 hours. BNP (last 3 results) No results for input(s): "PROBNP" in the last 8760 hours. HbA1C: No results for input(s): "HGBA1C" in the last 72 hours. CBG: Recent Labs  Lab 10/13/22 0919 10/13/22 1118  GLUCAP 128* 118*   Lipid Profile: No results for input(s): "CHOL", "HDL", "LDLCALC", "TRIG", "CHOLHDL", "LDLDIRECT" in the last 72 hours. Thyroid Function Tests: No results for input(s): "TSH", "T4TOTAL", "FREET4", "T3FREE", "THYROIDAB" in the last 72 hours. Anemia Panel: No results for input(s): "VITAMINB12", "FOLATE", "FERRITIN", "TIBC", "IRON", "RETICCTPCT" in the last 72 hours.  Sepsis Labs: Recent Labs  Lab 10/12/22 2153 10/13/22 0020 10/13/22 0536  PROCALCITON  --   --  0.41  LATICACIDVEN 2.4* 1.7  --     Recent Results (from the past 240 hour(s))  Blood Culture (routine x 2)     Status: None   Collection Time: 10/12/22 10:13 PM   Specimen: BLOOD LEFT HAND  Result Value Ref Range Status   Specimen Description BLOOD LEFT HAND  Final   Special Requests   Final    BOTTLES DRAWN AEROBIC AND ANAEROBIC Blood Culture adequate volume   Culture   Final    NO GROWTH 5 DAYS Performed at [facility], [address] [place], [place] [address]    Report Status 10/17/2022 FINAL  Final  Blood Culture (routine x 2)     Status: None   Collection Time: 10/12/22 10:16 PM   Specimen: Right Antecubital; Blood  Result Value Ref Range Status   Specimen  Description RIGHT ANTECUBITAL  Final   Special Requests   Final    BOTTLES DRAWN AEROBIC AND ANAEROBIC Blood Culture results may not be optimal due to an excessive volume of blood received in culture bottles   Culture   Final    NO GROWTH 5 DAYS Performed at [facility], [address], [place], [place] [address]    Report Status 10/17/2022 FINAL  Final  Resp Panel by RT-PCR (Flu A&B, Covid) Anterior Nasal Swab     Status: Abnormal   Collection Time: 10/12/22 10:25 PM   Specimen: Anterior Nasal Swab  Result Value Ref Range Status   SARS Coronavirus 2 by RT PCR POSITIVE (A) NEGATIVE Final    Comment: (NOTE) SARS-CoV-2 target nucleic acids are DETECTED.  The SARS-CoV-2 RNA is generally detectable in upper respiratory specimens during the acute phase of infection. Positive results are indicative of the presence of the identified virus, but do not rule out bacterial infection or co-infection with other pathogens not detected by the test. Clinical correlation with patient history and other diagnostic information is necessary to determine patient infection status. The expected result is Negative.  Fact Sheet for Patients: EntrepreneurPulse.com.au  Fact Sheet for Healthcare Providers: IncredibleEmployment.be  This test is not yet approved or cleared by the [place] FDA and  has been authorized for detection and/or diagnosis of SARS-CoV-2 by FDA under an Emergency Use Authorization (EUA).  This EUA will remain in effect (meaning this test can be used) for the duration of  the COVID-19 declaration under Section 564(b)(1) of the A ct, 21 U.S.C. section 360bbb-3(b)(1), unless the authorization is terminated or revoked sooner.     Influenza A by PCR NEGATIVE NEGATIVE Final   Influenza B by PCR NEGATIVE NEGATIVE Final    Comment: (NOTE) The Xpert Xpress SARS-CoV-2/FLU/RSV plus assay is intended as an aid in the diagnosis of  influenza from  Nasopharyngeal swab specimens and should not be used as a sole basis for treatment. Nasal washings and aspirates are unacceptable for Xpert Xpress SARS-CoV-2/FLU/RSV testing.  Fact Sheet for Patients: EntrepreneurPulse.com.au  Fact Sheet for Healthcare Providers: IncredibleEmployment.be  This test is not yet approved or cleared by the [place] FDA and has been authorized for detection and/or diagnosis of SARS-CoV-2 by FDA under an Emergency Use Authorization (EUA). This EUA will remain in effect (meaning this test can be used) for the duration of the COVID-19 declaration under Section 564(b)(1) of the Act, 21 U.S.C. section 360bbb-3(b)(1), unless the authorization is terminated or revoked.  Performed at [facility], [address] [place], [place] [address]   Urine Culture     Status: None   Collection Time: 10/13/22  2:33 AM   Specimen: In/Out Cath Urine  Result Value Ref Range Status   Specimen Description   Final    IN/OUT CATH URINE Performed at [facility], [address], [place], [place] [address]    Special Requests   Final    NONE Performed at [facility], [address] [place], [place] [address]    Culture   Final    NO GROWTH Performed at [facility], [place] [address] [place], [place] [address]    Report Status 10/14/2022 FINAL  Final  MRSA Next Gen by PCR, Nasal     Status: None   Collection Time: 10/13/22  4:59 AM   Specimen: Nasal Mucosa; Nasal Swab  Result Value Ref Range Status   MRSA by PCR Next Gen NOT DETECTED NOT DETECTED Final    Comment: (NOTE) The GeneXpert MRSA Assay (FDA approved for NASAL specimens only), is one component of a comprehensive MRSA colonization surveillance program. It is not intended to diagnose MRSA infection nor to guide or monitor treatment for MRSA infections. Test performance is not FDA approved in patients less than 54 years old. Performed at [facility], [address], [place], [place] [address]     Radiology Studies: No results found.   LOS: 5 days   Time spent: 35 minutes  Irwin Brakeman, MD [facility]  If 7PM-7AM, please contact night-coverage [email or web address] 10/18/2022, 3:56 PM

## 2022-10-18 NOTE — TOC Progression Note (Signed)
Transition of Care [facility]) - Progression Note    Patient Details  Name: Tyler Mejia MRN: [id number] Date of Birth: 12/24/1935  Transition of Care [facility]) CM/SW [place], [place] Phone Number: 10/18/2022, 2:15 PM  Clinical Narrative:    TOC updated that [facility] does not feel that pt is a candidate for [facility] at this time. They will reassess pt Monday morning. MD spoke with pts family and they wish to follow up on Monday. CSW reached out to the [facility] as pt was resident at [facility]. They will come assess pt Monday as well for backup plan if [facility] is unable to accept pt. TOC to follow.     Barriers to Discharge: Continued Medical Work up  Expected Discharge Plan and Services                                                 Social Determinants of Health (SDOH) Interventions    Readmission Risk Interventions     No data to display

## 2022-10-19 DIAGNOSIS — R6521 Severe sepsis with septic shock: Secondary | ICD-10-CM | POA: Diagnosis not present

## 2022-10-19 DIAGNOSIS — J690 Pneumonitis due to inhalation of food and vomit: Secondary | ICD-10-CM | POA: Diagnosis not present

## 2022-10-19 DIAGNOSIS — A419 Sepsis, unspecified organism: Secondary | ICD-10-CM | POA: Diagnosis not present

## 2022-10-19 DIAGNOSIS — U071 COVID-19: Secondary | ICD-10-CM | POA: Diagnosis not present

## 2022-10-19 MED ORDER — MORPHINE SULFATE (CONCENTRATE) 10 MG/0.5ML PO SOLN
8.0000 mg | Freq: Four times a day (QID) | ORAL | Status: DC
  Administered 2022-10-19 – 2022-10-21 (×8): 8 mg via ORAL
  Filled 2022-10-19 (×8): qty 0.5

## 2022-10-19 NOTE — Progress Notes (Signed)
PROGRESS NOTE   Tyler Mejia  JOA:[id number] DOB: 09-Aug-1936 DOA: 10/12/2022 PCP: [name], MD   Brief Narrative:    Tyler Mejia is a 86 y.o. male with medical history significant of COPD, BPH, CAD s/p CABG x4, NSTEMI (2013), history of stroke (January 2017) with residual difficulty in swallowing, depression who presents to the emergency department via EMS due to shortness of breath which was thought to be due to aspiration.  He presented from his facility with worsening hypoxemia and gurgling.  Daughter states that he has been deteriorating over the last several months from his dementia and was noted to have residual difficulty with dysphagia after CVA in 2017.  He was admitted with COVID-pneumonia as well as suspicion for aspiration pneumonia and started on antivirals as well as multiple antibiotics.  Daughter understands that patient's clinical condition is deteriorating and that his prognosis appears to be hours to days.  She is agreeable to comfort care and palliative has been consulted.  Residential hospice to evaluate 11/9.  Assessment & Plan:   Principal Problem:   Septic shock ([place]) Active Problems:   Dysphagia   Aspiration pneumonia ([facility])   Lactic acidosis   COVID-19 virus infection   Hyperglycemia   AKI (acute kidney injury) ([place])   History of stroke  Assessment and Plan:   Septic shock, present on admission secondary to presumed aspiration pneumonia   COVID-19 viral pneumonia   Chronic dysphagia in the setting of prior CVA and dementia   Type 2 diabetes with hyperglycemia   AKI   History of CVA and residual dysphagia   COPD   Obesity   Patient is now comfort care due to poor prognosis.  Palliative care consulted.  Anticipate disposition to residential hospice versus home hospice after further discussion.  [facility] to [facility] today and felt patient not yet ready for residential hospice but would reassess patient on 11/13.  Daughter requested that we  schedule his medications as the prn meds are not being given by nursing.  With him having increasing respiratory distress I have increased morphine to 8 mg to provide more relief.      DVT prophylaxis: full comfort measures Code Status: DNR  Family Communication: telephone call to daughter 11/10 Disposition Plan: TBD Status is: Inpatient   Consultants:  Palliative   Procedures:  None   Antimicrobials:  Anti-infectives (From admission, onward)    Start     Dose/Rate Route Frequency Ordered Stop   10/14/22 1000  remdesivir 100 mg in sodium chloride 0.9 % 100 mL IVPB  Status:  Discontinued       See Hyperspace for full Linked Orders Report.   100 mg 200 mL/hr over 30 Minutes Intravenous Daily 10/13/22 0540 10/13/22 0541   10/14/22 1000  remdesivir 100 mg in sodium chloride 0.9 % 100 mL IVPB  Status:  Discontinued       See Hyperspace for full Linked Orders Report.   100 mg 200 mL/hr over 30 Minutes Intravenous Daily 10/13/22 0542 10/13/22 1146   10/13/22 0800  Ampicillin-Sulbactam (UNASYN) 3 g in sodium chloride 0.9 % 100 mL IVPB  Status:  Discontinued        3 g 200 mL/hr over 30 Minutes Intravenous Every 6 hours 10/13/22 0738 10/13/22 1146   10/13/22 0657  remdesivir 100 mg in sodium chloride 0.9 % 100 mL IVPB       See Hyperspace for full Linked Orders Report.   100 mg 200 mL/hr over  30 Minutes Intravenous  Once 10/13/22 0542 10/13/22 1047   10/13/22 0630  remdesivir 100 mg in sodium chloride 0.9 % 100 mL IVPB       See Hyperspace for full Linked Orders Report.   100 mg 200 mL/hr over 30 Minutes Intravenous  Once 10/13/22 0542 10/13/22 1013   10/13/22 0615  remdesivir 200 mg in sodium chloride 0.9% 250 mL IVPB  Status:  Discontinued       See Hyperspace for full Linked Orders Report.   200 mg 580 mL/hr over 30 Minutes Intravenous Once 10/13/22 0540 10/13/22 0541   10/13/22 0600  azithromycin (ZITHROMAX) 500 mg in sodium chloride 0.9 % 250 mL IVPB  Status:  Discontinued         500 mg 250 mL/hr over 60 Minutes Intravenous Every 24 hours 10/13/22 0512 10/13/22 1146   10/13/22 0600  Ampicillin-Sulbactam (UNASYN) 3 g in sodium chloride 0.9 % 100 mL IVPB  Status:  Discontinued        3 g 200 mL/hr over 30 Minutes Intravenous Every 6 hours 10/13/22 0512 10/13/22 0738   10/12/22 2215  Ampicillin-Sulbactam (UNASYN) 3 g in sodium chloride 0.9 % 100 mL IVPB        3 g 200 mL/hr over 30 Minutes Intravenous  Once 10/12/22 2207 10/12/22 2336      Subjective: He appears comfortable.  He has loud breathing sounds.  Objective: Vitals:   10/17/22 0420 10/17/22 1415 10/17/22 2019 10/18/22 1300  BP: (!) 154/74 (!) 161/84 (!) 167/73 (!) 158/74  Pulse: 64 60 70 72  Resp: 14 (!) '24 20 20  '$ Temp: 97.6 F (36.4 C) 98.3 F (36.8 C) 98.6 F (37 C) 99.5 F (37.5 C)  TempSrc:  Oral  Oral  SpO2: 93% 93% 95% 93%  Weight:      Height:        Intake/Output Summary (Last 24 hours) at 10/19/2022 1041 Last data filed at 10/19/2022 0900 Gross per 24 hour  Intake 120 ml  Output --  Net 120 ml   Filed Weights   10/12/22 2157 10/13/22 0430  Weight: 107 kg 114.4 kg    Examination:  General exam: Appears calm and comfortable, unresponsive to questioning Respiratory system: rales heard bilaterally.  Loud ant noises heard.  Cardiovascular system: S1 & S2 heard, RRR.  Gastrointestinal system: Abdomen is soft Central nervous system: somnolent.  Extremities: No edema Skin: No significant lesions noted Psychiatry: Flat affect.  Data Reviewed: I have personally reviewed following labs and imaging studies  CBC: Recent Labs  Lab 10/12/22 2153 10/13/22 0632  WBC 12.7* 17.5*  NEUTROABS 10.6* 14.8*  HGB 14.4 13.3  HCT 44.9 42.5  MCV 90.7 92.8  PLT 171 782*   Basic Metabolic Panel: Recent Labs  Lab 10/12/22 2153 10/13/22 0632  NA 135 139  K 4.7 5.0  CL 100 105  CO2 27 26  GLUCOSE 179* 139*  BUN 28* 25*  CREATININE 1.29* 0.98  CALCIUM 8.7* 8.3*  MG  --  1.8   PHOS  --  4.6   GFR: Estimated Creatinine Clearance: 74.4 mL/min (by C-G formula based on SCr of 0.98 mg/dL). Liver Function Tests: Recent Labs  Lab 10/12/22 2153 10/13/22 0632  AST 20 22  ALT 15 16  ALKPHOS 74 67  BILITOT 1.3* 0.9  PROT 7.5 7.3  ALBUMIN 3.8 3.7   No results for input(s): "LIPASE", "AMYLASE" in the last 168 hours. No results for input(s): "AMMONIA" in the last 168 hours.  Coagulation Profile: Recent Labs  Lab 10/12/22 2153  INR 1.0   Cardiac Enzymes: No results for input(s): "CKTOTAL", "CKMB", "CKMBINDEX", "TROPONINI" in the last 168 hours. BNP (last 3 results) No results for input(s): "PROBNP" in the last 8760 hours. HbA1C: No results for input(s): "HGBA1C" in the last 72 hours. CBG: Recent Labs  Lab 10/13/22 0919 10/13/22 1118  GLUCAP 128* 118*   Lipid Profile: No results for input(s): "CHOL", "HDL", "LDLCALC", "TRIG", "CHOLHDL", "LDLDIRECT" in the last 72 hours. Thyroid Function Tests: No results for input(s): "TSH", "T4TOTAL", "FREET4", "T3FREE", "THYROIDAB" in the last 72 hours. Anemia Panel: No results for input(s): "VITAMINB12", "FOLATE", "FERRITIN", "TIBC", "IRON", "RETICCTPCT" in the last 72 hours.  Sepsis Labs: Recent Labs  Lab 10/12/22 2153 10/13/22 0020 10/13/22 0536  PROCALCITON  --   --  0.41  LATICACIDVEN 2.4* 1.7  --     Recent Results (from the past 240 hour(s))  Blood Culture (routine x 2)     Status: None   Collection Time: 10/12/22 10:13 PM   Specimen: BLOOD LEFT HAND  Result Value Ref Range Status   Specimen Description BLOOD LEFT HAND  Final   Special Requests   Final    BOTTLES DRAWN AEROBIC AND ANAEROBIC Blood Culture adequate volume   Culture   Final    NO GROWTH 5 DAYS Performed at [facility], [address], [place], [place] [address]    Report Status 10/17/2022 FINAL  Final  Blood Culture (routine x 2)     Status: None   Collection Time: 10/12/22 10:16 PM   Specimen: Right Antecubital; Blood  Result  Value Ref Range Status   Specimen Description RIGHT ANTECUBITAL  Final   Special Requests   Final    BOTTLES DRAWN AEROBIC AND ANAEROBIC Blood Culture results may not be optimal due to an excessive volume of blood received in culture bottles   Culture   Final    NO GROWTH 5 DAYS Performed at [facility], [address] [place], [place] [address]    Report Status 10/17/2022 FINAL  Final  Resp Panel by RT-PCR (Flu A&B, Covid) Anterior Nasal Swab     Status: Abnormal   Collection Time: 10/12/22 10:25 PM   Specimen: Anterior Nasal Swab  Result Value Ref Range Status   SARS Coronavirus 2 by RT PCR POSITIVE (A) NEGATIVE Final    Comment: (NOTE) SARS-CoV-2 target nucleic acids are DETECTED.  The SARS-CoV-2 RNA is generally detectable in upper respiratory specimens during the acute phase of infection. Positive results are indicative of the presence of the identified virus, but do not rule out bacterial infection or co-infection with other pathogens not detected by the test. Clinical correlation with patient history and other diagnostic information is necessary to determine patient infection status. The expected result is Negative.  Fact Sheet for Patients: EntrepreneurPulse.com.au  Fact Sheet for Healthcare Providers: IncredibleEmployment.be  This test is not yet approved or cleared by the [place] FDA and  has been authorized for detection and/or diagnosis of SARS-CoV-2 by FDA under an Emergency Use Authorization (EUA).  This EUA will remain in effect (meaning this test can be used) for the duration of  the COVID-19 declaration under Section 564(b)(1) of the A ct, 21 U.S.C. section 360bbb-3(b)(1), unless the authorization is terminated or revoked sooner.     Influenza A by PCR NEGATIVE NEGATIVE Final   Influenza B by PCR NEGATIVE NEGATIVE Final    Comment: (NOTE) The Xpert Xpress SARS-CoV-2/FLU/RSV plus assay is intended  as an aid in the  diagnosis of influenza from Nasopharyngeal swab specimens and should not be used as a sole basis for treatment. Nasal washings and aspirates are unacceptable for Xpert Xpress SARS-CoV-2/FLU/RSV testing.  Fact Sheet for Patients: EntrepreneurPulse.com.au  Fact Sheet for Healthcare Providers: IncredibleEmployment.be  This test is not yet approved or cleared by the [place] FDA and has been authorized for detection and/or diagnosis of SARS-CoV-2 by FDA under an Emergency Use Authorization (EUA). This EUA will remain in effect (meaning this test can be used) for the duration of the COVID-19 declaration under Section 564(b)(1) of the Act, 21 U.S.C. section 360bbb-3(b)(1), unless the authorization is terminated or revoked.  Performed at [facility], [address] [place], [place] [address]   Urine Culture     Status: None   Collection Time: 10/13/22  2:33 AM   Specimen: In/Out Cath Urine  Result Value Ref Range Status   Specimen Description   Final    IN/OUT CATH URINE Performed at [facility], [address] [place], [place] [address]    Special Requests   Final    NONE Performed at [facility], [address] [place], [place] [address]    Culture   Final    NO GROWTH Performed at [facility], [place] [address], [place], [place] [address]    Report Status 10/14/2022 FINAL  Final  MRSA Next Gen by PCR, Nasal     Status: None   Collection Time: 10/13/22  4:59 AM   Specimen: Nasal Mucosa; Nasal Swab  Result Value Ref Range Status   MRSA by PCR Next Gen NOT DETECTED NOT DETECTED Final    Comment: (NOTE) The GeneXpert MRSA Assay (FDA approved for NASAL specimens only), is one component of a comprehensive MRSA colonization surveillance program. It is not intended to diagnose MRSA infection nor to guide or monitor treatment for MRSA infections. Test performance is not FDA approved in patients less than 19 years old. Performed at  [facility], [address] [place],  [address]     Radiology Studies: No results found.   LOS: 6 days   Time spent: 35 minutes  Irwin Brakeman, MD [facility]  If 7PM-7AM, please contact night-coverage [email or web address] 10/19/2022, 10:41 AM

## 2022-10-20 DIAGNOSIS — J690 Pneumonitis due to inhalation of food and vomit: Secondary | ICD-10-CM | POA: Diagnosis not present

## 2022-10-20 DIAGNOSIS — R6521 Severe sepsis with septic shock: Secondary | ICD-10-CM | POA: Diagnosis not present

## 2022-10-20 DIAGNOSIS — U071 COVID-19: Secondary | ICD-10-CM | POA: Diagnosis not present

## 2022-10-20 DIAGNOSIS — A419 Sepsis, unspecified organism: Secondary | ICD-10-CM | POA: Diagnosis not present

## 2022-10-20 MED ORDER — GLYCOPYRROLATE 1 MG PO TABS: 1.0000 mg | ORAL_TABLET | Freq: Three times a day (TID) | ORAL | Status: DC

## 2022-10-20 MED ORDER — GLYCOPYRROLATE 0.2 MG/ML IJ SOLN
0.2000 mg | Freq: Three times a day (TID) | INTRAMUSCULAR | Status: DC
  Filled 2022-10-20 (×2): qty 1

## 2022-10-20 MED ORDER — GLYCOPYRROLATE 0.2 MG/ML IJ SOLN
0.2000 mg | Freq: Three times a day (TID) | INTRAMUSCULAR | Status: DC
  Administered 2022-10-20 – 2022-10-21 (×4): 0.2 mg via INTRAVENOUS
  Filled 2022-10-20 (×2): qty 1

## 2022-10-20 NOTE — Progress Notes (Signed)
PROGRESS NOTE   Tyler Mejia  OFB:[id number] DOB: 02/18/1936 DOA: 10/12/2022 PCP: [name], MD   Brief Narrative:    Tyler Mejia is a 86 y.o. male with medical history significant of COPD, BPH, CAD s/p CABG x4, NSTEMI (2013), history of stroke (January 2017) with residual difficulty in swallowing, depression who presents to the emergency department via EMS due to shortness of breath which was thought to be due to aspiration.  He presented from his facility with worsening hypoxemia and gurgling.  Daughter states that he has been deteriorating over the last several months from his dementia and was noted to have residual difficulty with dysphagia after CVA in 2017.  He was admitted with COVID-pneumonia as well as suspicion for aspiration pneumonia and started on antivirals as well as multiple antibiotics.  Daughter understands that patient's clinical condition is deteriorating and that his prognosis appears to be hours to days.  She is agreeable to comfort care and palliative has been consulted.  Residential hospice to evaluate 11/9.  Assessment & Plan:   Principal Problem:   Septic shock ([place]) Active Problems:   Dysphagia   Aspiration pneumonia ([facility])   Lactic acidosis   COVID-19 virus infection   Hyperglycemia   AKI (acute kidney injury) ([place])   History of stroke  Assessment and Plan:   Septic shock, present on admission secondary to presumed aspiration pneumonia   COVID-19 viral pneumonia   Chronic dysphagia in the setting of prior CVA and dementia   Type 2 diabetes with hyperglycemia   AKI   History of CVA and residual dysphagia   COPD   Obesity   Patient is now comfort care due to poor prognosis.  Palliative care consulted.  Anticipate disposition to residential hospice versus home hospice after further discussion.  [facility] and felt patient not yet ready for residential hospice but would reassess patient on 11/13.  Daughter requested that we  schedule his medications as the prn meds are not being given by nursing.  With him having increasing respiratory distress I have increased morphine to 8 mg to provide more relief. 10/20/2022 I am scheduling the robinul to help with the gurgling sounds and secretions. If this isn't helpful we will try atropine ophthalmic drops.       DVT prophylaxis: full comfort measures Code Status: DNR  Family Communication: telephone call to daughter 11/10, 11/12 Disposition Plan: TBD, residential hospice to reassess on 11/13 Status is: Inpatient   Consultants:  Palliative   Procedures:  None   Antimicrobials:  Anti-infectives (From admission, onward)    Start     Dose/Rate Route Frequency Ordered Stop   10/14/22 1000  remdesivir 100 mg in sodium chloride 0.9 % 100 mL IVPB  Status:  Discontinued       See Hyperspace for full Linked Orders Report.   100 mg 200 mL/hr over 30 Minutes Intravenous Daily 10/13/22 0540 10/13/22 0541   10/14/22 1000  remdesivir 100 mg in sodium chloride 0.9 % 100 mL IVPB  Status:  Discontinued       See Hyperspace for full Linked Orders Report.   100 mg 200 mL/hr over 30 Minutes Intravenous Daily 10/13/22 0542 10/13/22 1146   10/13/22 0800  Ampicillin-Sulbactam (UNASYN) 3 g in sodium chloride 0.9 % 100 mL IVPB  Status:  Discontinued        3 g 200 mL/hr over 30 Minutes Intravenous Every 6 hours 10/13/22 0738 10/13/22 1146   10/13/22 0657  remdesivir 100 mg in sodium chloride 0.9 % 100 mL IVPB       See Hyperspace for full Linked Orders Report.   100 mg 200 mL/hr over 30 Minutes Intravenous  Once 10/13/22 0542 10/13/22 1047   10/13/22 0630  remdesivir 100 mg in sodium chloride 0.9 % 100 mL IVPB       See Hyperspace for full Linked Orders Report.   100 mg 200 mL/hr over 30 Minutes Intravenous  Once 10/13/22 0542 10/13/22 1013   10/13/22 0615  remdesivir 200 mg in sodium chloride 0.9% 250 mL IVPB  Status:  Discontinued       See Hyperspace for full Linked Orders Report.    200 mg 580 mL/hr over 30 Minutes Intravenous Once 10/13/22 0540 10/13/22 0541   10/13/22 0600  azithromycin (ZITHROMAX) 500 mg in sodium chloride 0.9 % 250 mL IVPB  Status:  Discontinued        500 mg 250 mL/hr over 60 Minutes Intravenous Every 24 hours 10/13/22 0512 10/13/22 1146   10/13/22 0600  Ampicillin-Sulbactam (UNASYN) 3 g in sodium chloride 0.9 % 100 mL IVPB  Status:  Discontinued        3 g 200 mL/hr over 30 Minutes Intravenous Every 6 hours 10/13/22 0512 10/13/22 0738   10/12/22 2215  Ampicillin-Sulbactam (UNASYN) 3 g in sodium chloride 0.9 % 100 mL IVPB        3 g 200 mL/hr over 30 Minutes Intravenous  Once 10/12/22 2207 10/12/22 2336      Subjective: He appears comfortable.  He is responding better to morphine but having a lot of gurgling sounds and secretions.    Objective: Vitals:   10/17/22 2019 10/18/22 1300 10/19/22 1303 10/20/22 0602  BP: (!) 167/73 (!) 158/74 134/85 (!) 157/64  Pulse: 70 72 73 96  Resp: 20 20    Temp: 98.6 F (37 C) 99.5 F (37.5 C) 97.9 F (36.6 C) 98.4 F (36.9 C)  TempSrc:  Oral Oral Oral  SpO2: 95% 93% 92% (!) 79%  Weight:      Height:        Intake/Output Summary (Last 24 hours) at 10/20/2022 1130 Last data filed at 10/20/2022 4315 Gross per 24 hour  Intake 120 ml  Output 900 ml  Net -780 ml   Filed Weights   10/12/22 2157 10/13/22 0430  Weight: 107 kg 114.4 kg    Examination:  General exam: Appears calm and comfortable, unresponsive to questioning, he appears peaceful.  Respiratory system: rales heard bilaterally.  Loud ant noises heard.  Cardiovascular system: S1 & S2 heard, RRR.  Gastrointestinal system: Abdomen is soft Central nervous system: somnolent.  Extremities: No edema Skin: No significant lesions noted Psychiatry: Flat affect.  Data Reviewed: I have personally reviewed following labs and imaging studies  CBC: No results for input(s): "WBC", "NEUTROABS", "HGB", "HCT", "MCV", "PLT" in the last 168  hours.  Basic Metabolic Panel: No results for input(s): "NA", "K", "CL", "CO2", "GLUCOSE", "BUN", "CREATININE", "CALCIUM", "MG", "PHOS" in the last 168 hours.  GFR: Estimated Creatinine Clearance: 74.4 mL/min (by C-G formula based on SCr of 0.98 mg/dL). Liver Function Tests: No results for input(s): "AST", "ALT", "ALKPHOS", "BILITOT", "PROT", "ALBUMIN" in the last 168 hours.  No results for input(s): "LIPASE", "AMYLASE" in the last 168 hours. No results for input(s): "AMMONIA" in the last 168 hours. Coagulation Profile: No results for input(s): "INR", "PROTIME" in the last 168 hours.  Cardiac Enzymes: No results for input(s): "CKTOTAL", "CKMB", "CKMBINDEX", "  TROPONINI" in the last 168 hours. BNP (last 3 results) No results for input(s): "PROBNP" in the last 8760 hours. HbA1C: No results for input(s): "HGBA1C" in the last 72 hours. CBG: No results for input(s): "GLUCAP" in the last 168 hours.  Lipid Profile: No results for input(s): "CHOL", "HDL", "LDLCALC", "TRIG", "CHOLHDL", "LDLDIRECT" in the last 72 hours. Thyroid Function Tests: No results for input(s): "TSH", "T4TOTAL", "FREET4", "T3FREE", "THYROIDAB" in the last 72 hours. Anemia Panel: No results for input(s): "VITAMINB12", "FOLATE", "FERRITIN", "TIBC", "IRON", "RETICCTPCT" in the last 72 hours.  Sepsis Labs: No results for input(s): "PROCALCITON", "LATICACIDVEN" in the last 168 hours.   Recent Results (from the past 240 hour(s))  Blood Culture (routine x 2)     Status: None   Collection Time: 10/12/22 10:13 PM   Specimen: BLOOD LEFT HAND  Result Value Ref Range Status   Specimen Description BLOOD LEFT HAND  Final   Special Requests   Final    BOTTLES DRAWN AEROBIC AND ANAEROBIC Blood Culture adequate volume   Culture   Final    NO GROWTH 5 DAYS Performed at [facility], [address] [place], [place] [address]    Report Status 10/17/2022 FINAL  Final  Blood Culture (routine x 2)     Status: None   Collection  Time: 10/12/22 10:16 PM   Specimen: Right Antecubital; Blood  Result Value Ref Range Status   Specimen Description RIGHT ANTECUBITAL  Final   Special Requests   Final    BOTTLES DRAWN AEROBIC AND ANAEROBIC Blood Culture results may not be optimal due to an excessive volume of blood received in culture bottles   Culture   Final    NO GROWTH 5 DAYS Performed at [facility], [address] [place], [place] [address]    Report Status 10/17/2022 FINAL  Final  Resp Panel by RT-PCR (Flu A&B, Covid) Anterior Nasal Swab     Status: Abnormal   Collection Time: 10/12/22 10:25 PM   Specimen: Anterior Nasal Swab  Result Value Ref Range Status   SARS Coronavirus 2 by RT PCR POSITIVE (A) NEGATIVE Final    Comment: (NOTE) SARS-CoV-2 target nucleic acids are DETECTED.  The SARS-CoV-2 RNA is generally detectable in upper respiratory specimens during the acute phase of infection. Positive results are indicative of the presence of the identified virus, but do not rule out bacterial infection or co-infection with other pathogens not detected by the test. Clinical correlation with patient history and other diagnostic information is necessary to determine patient infection status. The expected result is Negative.  Fact Sheet for Patients: EntrepreneurPulse.com.au  Fact Sheet for Healthcare Providers: IncredibleEmployment.be  This test is not yet approved or cleared by the [place] FDA and  has been authorized for detection and/or diagnosis of SARS-CoV-2 by FDA under an Emergency Use Authorization (EUA).  This EUA will remain in effect (meaning this test can be used) for the duration of  the COVID-19 declaration under Section 564(b)(1) of the A ct, 21 U.S.C. section 360bbb-3(b)(1), unless the authorization is terminated or revoked sooner.     Influenza A by PCR NEGATIVE NEGATIVE Final   Influenza B by PCR NEGATIVE NEGATIVE Final    Comment: (NOTE) The  Xpert Xpress SARS-CoV-2/FLU/RSV plus assay is intended as an aid in the diagnosis of influenza from Nasopharyngeal swab specimens and should not be used as a sole basis for treatment. Nasal washings and aspirates are unacceptable for Xpert Xpress SARS-CoV-2/FLU/RSV testing.  Fact Sheet for Patients: EntrepreneurPulse.com.au  Fact Sheet for Healthcare Providers: IncredibleEmployment.be  This test is not yet approved or cleared by the [place] FDA and has been authorized for detection and/or diagnosis of SARS-CoV-2 by FDA under an Emergency Use Authorization (EUA). This EUA will remain in effect (meaning this test can be used) for the duration of the COVID-19 declaration under Section 564(b)(1) of the Act, 21 U.S.C. section 360bbb-3(b)(1), unless the authorization is terminated or revoked.  Performed at [facility], [address], [place], [place] [address]   Urine Culture     Status: None   Collection Time: 10/13/22  2:33 AM   Specimen: In/Out Cath Urine  Result Value Ref Range Status   Specimen Description   Final    IN/OUT CATH URINE Performed at [facility], [address], [place], [place] [address]    Special Requests   Final    NONE Performed at [facility], [address], [place], [place] [address]    Culture   Final    NO GROWTH Performed at [facility], [place] [address] [place], [place] [address]    Report Status 10/14/2022 FINAL  Final  MRSA Next Gen by PCR, Nasal     Status: None   Collection Time: 10/13/22  4:59 AM   Specimen: Nasal Mucosa; Nasal Swab  Result Value Ref Range Status   MRSA by PCR Next Gen NOT DETECTED NOT DETECTED Final    Comment: (NOTE) The GeneXpert MRSA Assay (FDA approved for NASAL specimens only), is one component of a comprehensive MRSA colonization surveillance program. It is not intended to diagnose MRSA infection nor to guide or monitor treatment for MRSA infections. Test performance  is not FDA approved in patients less than 56 years old. Performed at [facility], [address] [place], [place] [address]     Radiology Studies: No results found.   LOS: 7 days   Time spent: 35 minutes  Irwin Brakeman, MD [facility]  If 7PM-7AM, please contact night-coverage [email or web address] 10/20/2022, 11:30 AM

## 2022-10-21 DIAGNOSIS — U071 COVID-19: Secondary | ICD-10-CM | POA: Diagnosis not present

## 2022-10-21 DIAGNOSIS — A419 Sepsis, unspecified organism: Secondary | ICD-10-CM | POA: Diagnosis not present

## 2022-10-21 DIAGNOSIS — E872 Acidosis, unspecified: Secondary | ICD-10-CM | POA: Diagnosis not present

## 2022-10-21 DIAGNOSIS — J690 Pneumonitis due to inhalation of food and vomit: Secondary | ICD-10-CM | POA: Diagnosis not present

## 2022-10-21 NOTE — Progress Notes (Signed)
Palliative:  Chart review completed.  Conference with attending, bedside nursing staff, transition of care team.  [facility] is to return today to evaluate for residential hospice placement.  Over the weekend, attending has scheduled symptom management medications.  No additional needs at this time.  Plan: Requesting comfort and dignity at end-of-life, residential hospice with [facility].  No charge [name], NP Palliative medicine team Team phone [phone] Greater than 50% of this time was spent counseling and coordinating care related to the above assessment and plan.

## 2022-10-21 NOTE — Plan of Care (Signed)
  Problem: Pain Managment: Goal: General experience of comfort will improve Outcome: Progressing   

## 2022-10-21 NOTE — TOC Transition Note (Signed)
Transition of Care [facility]) - CM/SW Discharge Note   Patient Details  Name: Tyler Mejia MRN: [id number] Date of Birth: 1936-08-30  Transition of Care [facility]) CM/SW Contact:  Boneta Lucks, RN Phone Number: 10/21/2022, 12:50 PM   Clinical Narrative:   [facility] accepted for residential hospice. MDRN updated. RN to call report. Hospice will set up [facility] EMS to transport patient.   Final next level of care: [place] Barriers to Discharge: Barriers Resolved   Patient Goals and CMS Choice Patient states their goals for this hospitalization and ongoing recovery are:: Agreeable to [facility].gov Compare Post Acute Care list provided to:: Patient Represenative (must comment) Choice offered to / list presented to : Adult Children  Discharge Placement                Patient to be transferred to facility by: [facility] Name of family member notified: family at the bedside Patient and family notified of of transfer: 10/21/22  Discharge Plan and [place]    Readmission Risk Interventions    10/21/2022   12:49 PM  Readmission Risk Prevention Plan  Post Dischage Appt Complete  Medication Screening Complete  Transportation Screening Complete

## 2022-10-21 NOTE — Progress Notes (Signed)
Ng Discharge Note  Admit Date:  10/12/2022 Discharge date: 10/21/2022   Tyler Mejia to be D/C'd to hospice per MD order.  AVS completed. Patient/caregiver able to verbalize understanding.  Discharge Medication: Allergies as of 10/21/2022       Reactions   Codeine Other (See Comments)   Pt does not know   Statins Other (See Comments), Nausea And Vomiting        Medication List     STOP taking these medications    acetaminophen 325 MG tablet Commonly known as: TYLENOL   albuterol 108 (90 Base) MCG/ACT inhaler Commonly known as: VENTOLIN HFA   cholecalciferol 25 MCG (1000 UNIT) tablet Commonly known as: VITAMIN D3   metFORMIN 500 MG tablet Commonly known as: GLUCOPHAGE   nystatin cream Commonly known as: MYCOSTATIN   risperiDONE 0.5 MG tablet Commonly known as: RISPERDAL   sertraline 25 MG tablet Commonly known as: ZOLOFT   tamsulosin 0.4 MG Caps capsule Commonly known as: FLOMAX   Zinc Oxide 12 % Crea        Discharge Assessment: Vitals:   10/20/22 1351 10/21/22 0618  BP: 137/67 120/72  Pulse: 66 94  Resp: (!) 22   Temp: 99.5 F (37.5 C) 100.1 F (37.8 C)  SpO2: (!) 60% 92%   Skin clean, dry and intact without evidence of skin break down, no evidence of skin tears noted. IV catheter discontinued intact. Site without signs and symptoms of complications - no redness or edema noted at insertion site, patient denies c/o pain - only slight tenderness at site.  Dressing with slight pressure applied.  D/c Instructions-Education: Discharge instructions given to patient/family with verbalized understanding. D/c education completed with patient/family including follow up instructions, medication list, d/c activities limitations if indicated, with other d/c instructions as indicated by MD - patient able to verbalize understanding, all questions fully answered. Patient instructed to return to ED, call 911, or call MD for any changes in condition.  Patient  escorted via [place], and D/C home via private auto.  Richrd Prime, LPN 24/49/7530 0:51 PM

## 2022-10-21 NOTE — Discharge Summary (Signed)
Physician Discharge Summary  Tyler Mejia OJJ:[id number] DOB: February 21, 1936 DOA: 10/12/2022  PCP: [name], MD  Admit date: 10/12/2022 Discharge date: 10/21/2022  Disposition: Residential Hospice   Recommendations for Outpatient Follow-up:  Symptom Management Per Hospice Protocol   Discharge Condition: HOSPICE   CODE STATUS: DNR DIET: Comfort [employer]   Brief Hospitalization Summary: Please see all hospital notes, images, labs for full details of the hospitalization. Tyler Mejia is a 86 y.o. male with medical history significant of COPD, BPH, CAD s/p CABG x4, NSTEMI (2013), history of stroke (January 2017) with residual difficulty in swallowing, depression who presents to the emergency department via EMS due to shortness of breath which was thought to be due to aspiration.  He presented from his facility with worsening hypoxemia and gurgling.  Daughter states that he has been deteriorating over the last several months from his dementia and was noted to have residual difficulty with dysphagia after CVA in 2017.  He was admitted with COVID-pneumonia as well as suspicion for aspiration pneumonia and started on antivirals as well as multiple antibiotics.  Daughter understands that patient's clinical condition is deteriorating and that his prognosis appears to be hours to days.  She is agreeable to comfort care and palliative has been consulted.  Residential hospice to evaluate 11/9.  Hospital Course by problem   Septic shock, present on admission secondary to presumed aspiration pneumonia   COVID-19 viral pneumonia   Chronic dysphagia in the setting of prior CVA and dementia   Type 2 diabetes with hyperglycemia   AKI   History of CVA and residual dysphagia   COPD   Obesity   Patient is now comfort care due to poor prognosis.  Palliative care consulted.  Anticipate disposition to residential hospice versus home hospice after further discussion.  [facility] came to assess today and  felt patient not yet ready for residential hospice but would reassess patient on 11/13.  Daughter requested that we schedule his medications as the prn meds are not being given by nursing.  With him having increasing respiratory distress I have increased morphine to 8 mg to provide more relief. 10/20/2022 I scheduled the robinul to help with the gurgling sounds and secretions. If this isn't helpful we will try atropine ophthalmic drops.       DVT prophylaxis: full comfort measures Code Status: DNR  Family Communication: telephone call to daughter 11/10, 11/12, 11/13 Disposition Plan: residential hospice 11/13 Status is: Inpatient   Consultants:  Palliative   Discharge Diagnoses:  Principal Problem:   Septic shock ([place]) Active Problems:   Dysphagia   Aspiration pneumonia ([place])   Lactic acidosis   COVID-19 virus infection   Hyperglycemia   AKI (acute kidney injury) ([place])   History of stroke   Discharge Instructions:  Allergies as of 10/21/2022       Reactions   Codeine Other (See Comments)   Pt does not know   Statins Other (See Comments), Nausea And Vomiting        Medication List     STOP taking these medications    acetaminophen 325 MG tablet Commonly known as: TYLENOL   albuterol 108 (90 Base) MCG/ACT inhaler Commonly known as: VENTOLIN HFA   cholecalciferol 25 MCG (1000 UNIT) tablet Commonly known as: VITAMIN D3   metFORMIN 500 MG tablet Commonly known as: GLUCOPHAGE   nystatin cream Commonly known as: MYCOSTATIN   risperiDONE 0.5 MG tablet Commonly known as: RISPERDAL   sertraline 25 MG tablet Commonly  known as: ZOLOFT   tamsulosin 0.4 MG Caps capsule Commonly known as: FLOMAX   Zinc Oxide 12 % Crea        Allergies  Allergen Reactions   Codeine Other (See Comments)    Pt does not know    Statins Other (See Comments) and Nausea And Vomiting   Allergies as of 10/21/2022       Reactions   Codeine Other (See Comments)   Pt does not  know   Statins Other (See Comments), Nausea And Vomiting        Medication List     STOP taking these medications    acetaminophen 325 MG tablet Commonly known as: TYLENOL   albuterol 108 (90 Base) MCG/ACT inhaler Commonly known as: VENTOLIN HFA   cholecalciferol 25 MCG (1000 UNIT) tablet Commonly known as: VITAMIN D3   metFORMIN 500 MG tablet Commonly known as: GLUCOPHAGE   nystatin cream Commonly known as: MYCOSTATIN   risperiDONE 0.5 MG tablet Commonly known as: RISPERDAL   sertraline 25 MG tablet Commonly known as: ZOLOFT   tamsulosin 0.4 MG Caps capsule Commonly known as: FLOMAX   Zinc Oxide 12 % Crea        Procedures/Studies: DG Chest Port 1 View  Result Date: 10/12/2022 CLINICAL DATA:  Questionable sepsis - evaluate for abnormality EXAM: PORTABLE CHEST 1 VIEW COMPARISON:  01/07/2022 FINDINGS: Prior median sternotomy. Stable heart size and mediastinal contours. Aortic atherosclerosis. Chronic interstitial coarsening. Streaky atelectasis in the lung bases. No pleural fluid or pneumothorax. No pulmonary edema. IMPRESSION: Streaky bibasilar atelectasis. Chronic interstitial coarsening. Electronically Signed   By: Keith Rake M.D.   On: 10/12/2022 23:01     Subjective: Pt appears comfortable.   Discharge Exam: Vitals:   10/20/22 1351 10/21/22 0618  BP: 137/67 120/72  Pulse: 66 94  Resp: (!) 22   Temp: 99.5 F (37.5 C) 100.1 F (37.8 C)  SpO2: (!) 60% 92%   Vitals:   10/19/22 1303 10/20/22 0602 10/20/22 1351 10/21/22 0618  BP: 134/85 (!) 157/64 137/67 120/72  Pulse: 73 96 66 94  Resp:   (!) 22   Temp: 97.9 F (36.6 C) 98.4 F (36.9 C) 99.5 F (37.5 C) 100.1 F (37.8 C)  TempSrc: Oral Oral Axillary Oral  SpO2: 92% (!) 79% (!) 60% 92%  Weight:      Height:       General exam: Appears calm and comfortable, unresponsive to questioning, he appears peaceful.  Respiratory system: rales heard bilaterally.  Loud ant noises heard.   Cardiovascular system: S1 & S2 heard, RRR.  Gastrointestinal system: Abdomen is soft Central nervous system: somnolent.  Extremities: No edema Skin: No significant lesions noted Psychiatry: Flat affect.   The results of significant diagnostics from this hospitalization (including imaging, microbiology, ancillary and laboratory) are listed below for reference.     Microbiology: Recent Results (from the past 240 hour(s))  Blood Culture (routine x 2)     Status: None   Collection Time: 10/12/22 10:13 PM   Specimen: BLOOD LEFT HAND  Result Value Ref Range Status   Specimen Description BLOOD LEFT HAND  Final   Special Requests   Final    BOTTLES DRAWN AEROBIC AND ANAEROBIC Blood Culture adequate volume   Culture   Final    NO GROWTH 5 DAYS Performed at [facility], [address] [place], [place] [address]    Report Status 10/17/2022 FINAL  Final  Blood Culture (routine x 2)  Status: None   Collection Time: 10/12/22 10:16 PM   Specimen: Right Antecubital; Blood  Result Value Ref Range Status   Specimen Description RIGHT ANTECUBITAL  Final   Special Requests   Final    BOTTLES DRAWN AEROBIC AND ANAEROBIC Blood Culture results may not be optimal due to an excessive volume of blood received in culture bottles   Culture   Final    NO GROWTH 5 DAYS Performed at [facility], [address], [place], [place] [address]    Report Status 10/17/2022 FINAL  Final  Resp Panel by RT-PCR (Flu A&B, Covid) Anterior Nasal Swab     Status: Abnormal   Collection Time: 10/12/22 10:25 PM   Specimen: Anterior Nasal Swab  Result Value Ref Range Status   SARS Coronavirus 2 by RT PCR POSITIVE (A) NEGATIVE Final    Comment: (NOTE) SARS-CoV-2 target nucleic acids are DETECTED.  The SARS-CoV-2 RNA is generally detectable in upper respiratory specimens during the acute phase of infection. Positive results are indicative of the presence of the identified virus, but do not rule out bacterial  infection or co-infection with other pathogens not detected by the test. Clinical correlation with patient history and other diagnostic information is necessary to determine patient infection status. The expected result is Negative.  Fact Sheet for Patients: EntrepreneurPulse.com.au  Fact Sheet for Healthcare Providers: IncredibleEmployment.be  This test is not yet approved or cleared by the [place] FDA and  has been authorized for detection and/or diagnosis of SARS-CoV-2 by FDA under an Emergency Use Authorization (EUA).  This EUA will remain in effect (meaning this test can be used) for the duration of  the COVID-19 declaration under Section 564(b)(1) of the A ct, 21 U.S.C. section 360bbb-3(b)(1), unless the authorization is terminated or revoked sooner.     Influenza A by PCR NEGATIVE NEGATIVE Final   Influenza B by PCR NEGATIVE NEGATIVE Final    Comment: (NOTE) The Xpert Xpress SARS-CoV-2/FLU/RSV plus assay is intended as an aid in the diagnosis of influenza from Nasopharyngeal swab specimens and should not be used as a sole basis for treatment. Nasal washings and aspirates are unacceptable for Xpert Xpress SARS-CoV-2/FLU/RSV testing.  Fact Sheet for Patients: EntrepreneurPulse.com.au  Fact Sheet for Healthcare Providers: IncredibleEmployment.be  This test is not yet approved or cleared by the [place] FDA and has been authorized for detection and/or diagnosis of SARS-CoV-2 by FDA under an Emergency Use Authorization (EUA). This EUA will remain in effect (meaning this test can be used) for the duration of the COVID-19 declaration under Section 564(b)(1) of the Act, 21 U.S.C. section 360bbb-3(b)(1), unless the authorization is terminated or revoked.  Performed at [facility], [address] [place], [place] [address]   Urine Culture     Status: None   Collection Time: 10/13/22  2:33 AM    Specimen: In/Out Cath Urine  Result Value Ref Range Status   Specimen Description   Final    IN/OUT CATH URINE Performed at [facility], [address], [place], [place] [address]    Special Requests   Final    NONE Performed at [facility], [address], [place], [place] [address]    Culture   Final    NO GROWTH Performed at [facility], [place] [address] [place], [place] [address]    Report Status 10/14/2022 FINAL  Final  MRSA Next Gen by PCR, Nasal     Status: None   Collection Time: 10/13/22  4:59 AM  Specimen: Nasal Mucosa; Nasal Swab  Result Value Ref Range Status   MRSA by PCR Next Gen NOT DETECTED NOT DETECTED Final    Comment: (NOTE) The GeneXpert MRSA Assay (FDA approved for NASAL specimens only), is one component of a comprehensive MRSA colonization surveillance program. It is not intended to diagnose MRSA infection nor to guide or monitor treatment for MRSA infections. Test performance is not FDA approved in patients less than 61 years old. Performed at [facility], [address] [place], [place] [address]      Labs: BNP (last 3 results) No results for input(s): "BNP" in the last 8760 hours. Basic Metabolic Panel: No results for input(s): "NA", "K", "CL", "CO2", "GLUCOSE", "BUN", "CREATININE", "CALCIUM", "MG", "PHOS" in the last 168 hours. Liver Function Tests: No results for input(s): "AST", "ALT", "ALKPHOS", "BILITOT", "PROT", "ALBUMIN" in the last 168 hours. No results for input(s): "LIPASE", "AMYLASE" in the last 168 hours. No results for input(s): "AMMONIA" in the last 168 hours. CBC: No results for input(s): "WBC", "NEUTROABS", "HGB", "HCT", "MCV", "PLT" in the last 168 hours. Cardiac Enzymes: No results for input(s): "CKTOTAL", "CKMB", "CKMBINDEX", "TROPONINI" in the last 168 hours. BNP: Invalid input(s): "POCBNP" CBG: No results for input(s): "GLUCAP" in the last 168 hours. D-Dimer No results for input(s): "DDIMER" in the last 72  hours. Hgb A1c No results for input(s): "HGBA1C" in the last 72 hours. Lipid Profile No results for input(s): "CHOL", "HDL", "LDLCALC", "TRIG", "CHOLHDL", "LDLDIRECT" in the last 72 hours. Thyroid function studies No results for input(s): "TSH", "T4TOTAL", "T3FREE", "THYROIDAB" in the last 72 hours.  Invalid input(s): "FREET3" Anemia work up No results for input(s): "VITAMINB12", "FOLATE", "FERRITIN", "TIBC", "IRON", "RETICCTPCT" in the last 72 hours. Urinalysis    Component Value Date/Time   COLORURINE YELLOW 10/13/2022 0233   APPEARANCEUR HAZY (A) 10/13/2022 0233   APPEARANCEUR Clear 04/06/2020 1313   LABSPEC 1.025 10/13/2022 0233   PHURINE 5.0 10/13/2022 0233   GLUCOSEU NEGATIVE 10/13/2022 0233   HGBUR NEGATIVE 10/13/2022 0233   BILIRUBINUR NEGATIVE 10/13/2022 0233   BILIRUBINUR Negative 04/06/2020 1313   KETONESUR NEGATIVE 10/13/2022 0233   PROTEINUR 100 (A) 10/13/2022 0233   UROBILINOGEN 0.2 08/23/2015 0443   NITRITE NEGATIVE 10/13/2022 0233   LEUKOCYTESUR NEGATIVE 10/13/2022 0233   Sepsis Labs No results for input(s): "WBC" in the last 168 hours.  Invalid input(s): "PROCALCITONIN", "LACTICIDVEN" Microbiology Recent Results (from the past 240 hour(s))  Blood Culture (routine x 2)     Status: None   Collection Time: 10/12/22 10:13 PM   Specimen: BLOOD LEFT HAND  Result Value Ref Range Status   Specimen Description BLOOD LEFT HAND  Final   Special Requests   Final    BOTTLES DRAWN AEROBIC AND ANAEROBIC Blood Culture adequate volume   Culture   Final    NO GROWTH 5 DAYS Performed at [facility], [address], [place], [place] [address]    Report Status 10/17/2022 FINAL  Final  Blood Culture (routine x 2)     Status: None   Collection Time: 10/12/22 10:16 PM   Specimen: Right Antecubital; Blood  Result Value Ref Range Status   Specimen Description RIGHT ANTECUBITAL  Final   Special Requests   Final    BOTTLES DRAWN AEROBIC AND ANAEROBIC Blood Culture results  may not be optimal due to an excessive volume of blood received in culture bottles   Culture   Final    NO GROWTH 5 DAYS Performed at [facility], [place]  [address] [place], [place] [address]    Report Status 10/17/2022 FINAL  Final  Resp Panel by RT-PCR (Flu A&B, Covid) Anterior Nasal Swab     Status: Abnormal   Collection Time: 10/12/22 10:25 PM   Specimen: Anterior Nasal Swab  Result Value Ref Range Status   SARS Coronavirus 2 by RT PCR POSITIVE (A) NEGATIVE Final    Comment: (NOTE) SARS-CoV-2 target nucleic acids are DETECTED.  The SARS-CoV-2 RNA is generally detectable in upper respiratory specimens during the acute phase of infection. Positive results are indicative of the presence of the identified virus, but do not rule out bacterial infection or co-infection with other pathogens not detected by the test. Clinical correlation with patient history and other diagnostic information is necessary to determine patient infection status. The expected result is Negative.  Fact Sheet for Patients: EntrepreneurPulse.com.au  Fact Sheet for Healthcare Providers: IncredibleEmployment.be  This test is not yet approved or cleared by the [place] FDA and  has been authorized for detection and/or diagnosis of SARS-CoV-2 by FDA under an Emergency Use Authorization (EUA).  This EUA will remain in effect (meaning this test can be used) for the duration of  the COVID-19 declaration under Section 564(b)(1) of the A ct, 21 U.S.C. section 360bbb-3(b)(1), unless the authorization is terminated or revoked sooner.     Influenza A by PCR NEGATIVE NEGATIVE Final   Influenza B by PCR NEGATIVE NEGATIVE Final    Comment: (NOTE) The Xpert Xpress SARS-CoV-2/FLU/RSV plus assay is intended as an aid in the diagnosis of influenza from Nasopharyngeal swab specimens and should not be used as a sole basis for treatment. Nasal washings and aspirates are unacceptable for  Xpert Xpress SARS-CoV-2/FLU/RSV testing.  Fact Sheet for Patients: EntrepreneurPulse.com.au  Fact Sheet for Healthcare Providers: IncredibleEmployment.be  This test is not yet approved or cleared by the [place] FDA and has been authorized for detection and/or diagnosis of SARS-CoV-2 by FDA under an Emergency Use Authorization (EUA). This EUA will remain in effect (meaning this test can be used) for the duration of the COVID-19 declaration under Section 564(b)(1) of the Act, 21 U.S.C. section 360bbb-3(b)(1), unless the authorization is terminated or revoked.  Performed at [facility], [address] [place], [place] [address]   Urine Culture     Status: None   Collection Time: 10/13/22  2:33 AM   Specimen: In/Out Cath Urine  Result Value Ref Range Status   Specimen Description   Final    IN/OUT CATH URINE Performed at [facility], [address] [place], [place] [address]    Special Requests   Final    NONE Performed at [facility], [address] [place], [place] [address]    Culture   Final    NO GROWTH Performed at [facility], [place] [address] [place],  [address]    Report Status 10/14/2022 FINAL  Final  MRSA Next Gen by PCR, Nasal     Status: None   Collection Time: 10/13/22  4:59 AM   Specimen: Nasal Mucosa; Nasal Swab  Result Value Ref Range Status   MRSA by PCR Next Gen NOT DETECTED NOT DETECTED Final    Comment: (NOTE) The GeneXpert MRSA Assay (FDA approved for NASAL specimens only), is one component of a comprehensive MRSA colonization surveillance program. It is not intended to diagnose MRSA infection nor to guide or monitor treatment for MRSA infections. Test performance is not FDA approved in patients less than 54 years old. Performed at [facility], [address] [place], [place] [address]    Time coordinating discharge: 35 mins  SIGNED:  Irwin Brakeman, MD  [facility] 10/21/2022,  1:08 PM How to contact the [facility] Attending or Consulting provider [place] or covering provider during after hours [place], for this patient?  Check the care team in [facility] and look for a) attending/consulting [facility] provider listed and b) the [facility] team listed Log into [email or web address] and use [place] universal password to access. If you do not have the password, please contact the hospital operator. Locate the [facility] provider you are looking for under [facility] and page to a number that you can be directly reached. If you still have difficulty reaching the provider, please page the [facility] (Director on Call) for the Hospitalists listed on amion for assistance.

## 2022-11-08 DEATH — deceased
